# Patient Record
Sex: Female | Born: 1945 | Race: White | Hispanic: No | Marital: Married | Smoking: Former smoker
Health system: Southern US, Community
[De-identification: ages and names within clinical notes are randomized; demographics above are authoritative.]

## PROBLEM LIST (undated history)

## (undated) DIAGNOSIS — M8580 Other specified disorders of bone density and structure, unspecified site: Secondary | ICD-10-CM

## (undated) DIAGNOSIS — E785 Hyperlipidemia, unspecified: Secondary | ICD-10-CM

## (undated) DIAGNOSIS — K589 Irritable bowel syndrome without diarrhea: Secondary | ICD-10-CM

## (undated) DIAGNOSIS — K219 Gastro-esophageal reflux disease without esophagitis: Secondary | ICD-10-CM

## (undated) DIAGNOSIS — R51 Headache: Secondary | ICD-10-CM

## (undated) DIAGNOSIS — K449 Diaphragmatic hernia without obstruction or gangrene: Secondary | ICD-10-CM

## (undated) DIAGNOSIS — R0902 Hypoxemia: Secondary | ICD-10-CM

## (undated) DIAGNOSIS — N952 Postmenopausal atrophic vaginitis: Secondary | ICD-10-CM

## (undated) DIAGNOSIS — K5790 Diverticulosis of intestine, part unspecified, without perforation or abscess without bleeding: Secondary | ICD-10-CM

## (undated) DIAGNOSIS — R519 Headache, unspecified: Secondary | ICD-10-CM

## (undated) DIAGNOSIS — D869 Sarcoidosis, unspecified: Secondary | ICD-10-CM

## (undated) DIAGNOSIS — H269 Unspecified cataract: Secondary | ICD-10-CM

## (undated) DIAGNOSIS — Z8701 Personal history of pneumonia (recurrent): Secondary | ICD-10-CM

## (undated) DIAGNOSIS — M1990 Unspecified osteoarthritis, unspecified site: Secondary | ICD-10-CM

## (undated) DIAGNOSIS — C50919 Malignant neoplasm of unspecified site of unspecified female breast: Secondary | ICD-10-CM

## (undated) DIAGNOSIS — Z8619 Personal history of other infectious and parasitic diseases: Secondary | ICD-10-CM

## (undated) DIAGNOSIS — Z95818 Presence of other cardiac implants and grafts: Secondary | ICD-10-CM

## (undated) DIAGNOSIS — G4730 Sleep apnea, unspecified: Secondary | ICD-10-CM

## (undated) DIAGNOSIS — T7840XA Allergy, unspecified, initial encounter: Secondary | ICD-10-CM

## (undated) HISTORY — DX: Unspecified osteoarthritis, unspecified site: M19.90

## (undated) HISTORY — DX: Unspecified cataract: H26.9

## (undated) HISTORY — DX: Sleep apnea, unspecified: G47.30

## (undated) HISTORY — DX: Hyperlipidemia, unspecified: E78.5

## (undated) HISTORY — DX: Diaphragmatic hernia without obstruction or gangrene: K44.9

## (undated) HISTORY — DX: Allergy, unspecified, initial encounter: T78.40XA

## (undated) HISTORY — DX: Irritable bowel syndrome, unspecified: K58.9

## (undated) HISTORY — DX: Hypoxemia: R09.02

## (undated) HISTORY — PX: TUBAL LIGATION: SHX77

## (undated) HISTORY — DX: Gastro-esophageal reflux disease without esophagitis: K21.9

## (undated) HISTORY — DX: Personal history of pneumonia (recurrent): Z87.01

## (undated) HISTORY — DX: Diverticulosis of intestine, part unspecified, without perforation or abscess without bleeding: K57.90

## (undated) HISTORY — DX: Personal history of other infectious and parasitic diseases: Z86.19

## (undated) HISTORY — PX: POLYPECTOMY: SHX149

## (undated) HISTORY — DX: Malignant neoplasm of unspecified site of unspecified female breast: C50.919

## (undated) HISTORY — PX: APPENDECTOMY: SHX54

## (undated) HISTORY — PX: EYE SURGERY: SHX253

## (undated) HISTORY — DX: Other specified disorders of bone density and structure, unspecified site: M85.80

## (undated) HISTORY — PX: SPINE SURGERY: SHX786

## (undated) HISTORY — DX: Postmenopausal atrophic vaginitis: N95.2

## (undated) HISTORY — PX: BREAST SURGERY: SHX581

---

## 1999-10-02 HISTORY — PX: DILATION AND CURETTAGE OF UTERUS: SHX78

## 1999-12-31 ENCOUNTER — Encounter: Payer: Self-pay | Admitting: Gastroenterology

## 2000-12-15 ENCOUNTER — Encounter: Admission: RE | Admit: 2000-12-15 | Discharge: 2000-12-15 | Payer: Self-pay | Admitting: Gastroenterology

## 2000-12-15 ENCOUNTER — Encounter: Payer: Self-pay | Admitting: Gastroenterology

## 2001-06-20 ENCOUNTER — Encounter: Payer: Self-pay | Admitting: Family Medicine

## 2001-06-20 ENCOUNTER — Encounter: Admission: RE | Admit: 2001-06-20 | Discharge: 2001-06-20 | Payer: Self-pay | Admitting: Family Medicine

## 2001-07-15 LAB — FECAL OCCULT BLOOD, GUAIAC: Fecal Occult Blood: NEGATIVE

## 2001-08-31 HISTORY — PX: TOTAL ABDOMINAL HYSTERECTOMY: SHX209

## 2002-05-31 HISTORY — PX: LAPAROSCOPIC TOTAL HYSTERECTOMY: SUR800

## 2003-06-18 ENCOUNTER — Ambulatory Visit (HOSPITAL_COMMUNITY): Admission: RE | Admit: 2003-06-18 | Discharge: 2003-06-18 | Payer: Self-pay | Admitting: Gastroenterology

## 2003-06-18 ENCOUNTER — Encounter: Payer: Self-pay | Admitting: Gastroenterology

## 2003-09-01 HISTORY — PX: OTHER SURGICAL HISTORY: SHX169

## 2004-09-24 ENCOUNTER — Ambulatory Visit: Payer: Self-pay | Admitting: Radiation Oncology

## 2004-10-31 ENCOUNTER — Ambulatory Visit: Payer: Self-pay | Admitting: Family Medicine

## 2005-03-11 ENCOUNTER — Ambulatory Visit: Payer: Self-pay | Admitting: Family Medicine

## 2005-03-26 ENCOUNTER — Ambulatory Visit: Payer: Self-pay | Admitting: Radiation Oncology

## 2005-04-01 ENCOUNTER — Ambulatory Visit: Payer: Self-pay | Admitting: Gastroenterology

## 2005-04-29 ENCOUNTER — Ambulatory Visit: Payer: Self-pay | Admitting: Gastroenterology

## 2005-07-20 ENCOUNTER — Other Ambulatory Visit: Admission: RE | Admit: 2005-07-20 | Discharge: 2005-07-20 | Payer: Self-pay | Admitting: Family Medicine

## 2005-07-20 ENCOUNTER — Encounter (INDEPENDENT_AMBULATORY_CARE_PROVIDER_SITE_OTHER): Payer: Self-pay | Admitting: *Deleted

## 2005-07-20 ENCOUNTER — Ambulatory Visit: Payer: Self-pay | Admitting: Family Medicine

## 2005-07-20 LAB — CONVERTED CEMR LAB: Pap Smear: NORMAL

## 2005-08-05 ENCOUNTER — Ambulatory Visit: Payer: Self-pay | Admitting: Family Medicine

## 2005-10-09 ENCOUNTER — Ambulatory Visit: Payer: Self-pay | Admitting: Radiation Oncology

## 2006-01-04 ENCOUNTER — Ambulatory Visit: Payer: Self-pay | Admitting: Family Medicine

## 2006-07-05 ENCOUNTER — Ambulatory Visit: Payer: Self-pay | Admitting: Family Medicine

## 2006-07-27 ENCOUNTER — Ambulatory Visit: Payer: Self-pay | Admitting: Family Medicine

## 2006-09-02 ENCOUNTER — Ambulatory Visit: Payer: Self-pay | Admitting: Family Medicine

## 2006-10-19 ENCOUNTER — Ambulatory Visit: Payer: Self-pay | Admitting: Family Medicine

## 2006-10-22 ENCOUNTER — Ambulatory Visit: Payer: Self-pay | Admitting: Family Medicine

## 2006-11-03 ENCOUNTER — Ambulatory Visit: Payer: Self-pay | Admitting: Family Medicine

## 2007-07-08 ENCOUNTER — Ambulatory Visit: Payer: Self-pay | Admitting: Family Medicine

## 2007-07-27 ENCOUNTER — Telehealth: Payer: Self-pay | Admitting: Family Medicine

## 2007-08-12 ENCOUNTER — Ambulatory Visit: Payer: Self-pay | Admitting: Family Medicine

## 2007-08-12 DIAGNOSIS — Z8719 Personal history of other diseases of the digestive system: Secondary | ICD-10-CM | POA: Insufficient documentation

## 2007-08-15 LAB — CONVERTED CEMR LAB
Albumin: 3.7 g/dL (ref 3.5–5.2)
BUN: 18 mg/dL (ref 6–23)
Basophils Absolute: 0 10*3/uL (ref 0.0–0.1)
Basophils Relative: 0.1 % (ref 0.0–1.0)
CO2: 26 meq/L (ref 19–32)
Calcium: 9.2 mg/dL (ref 8.4–10.5)
Chloride: 109 meq/L (ref 96–112)
Creatinine, Ser: 0.8 mg/dL (ref 0.4–1.2)
Eosinophils Absolute: 0.2 10*3/uL (ref 0.0–0.6)
Eosinophils Relative: 3.6 % (ref 0.0–5.0)
GFR calc Af Amer: 93 mL/min
GFR calc non Af Amer: 77 mL/min
Glucose, Bld: 87 mg/dL (ref 70–99)
HCT: 39 % (ref 36.0–46.0)
Hemoglobin: 13.8 g/dL (ref 12.0–15.0)
Lymphocytes Relative: 25.6 % (ref 12.0–46.0)
MCHC: 35.4 g/dL (ref 30.0–36.0)
MCV: 91.9 fL (ref 78.0–100.0)
Monocytes Absolute: 0.5 10*3/uL (ref 0.2–0.7)
Monocytes Relative: 7.5 % (ref 3.0–11.0)
Neutro Abs: 3.8 10*3/uL (ref 1.4–7.7)
Neutrophils Relative %: 63.2 % (ref 43.0–77.0)
Phosphorus: 3.5 mg/dL (ref 2.3–4.6)
Platelets: 327 10*3/uL (ref 150–400)
Potassium: 4.1 meq/L (ref 3.5–5.1)
RBC: 4.24 M/uL (ref 3.87–5.11)
RDW: 13.1 % (ref 11.5–14.6)
Sodium: 144 meq/L (ref 135–145)
WBC: 6.1 10*3/uL (ref 4.5–10.5)

## 2007-08-16 ENCOUNTER — Encounter: Payer: Self-pay | Admitting: Family Medicine

## 2007-09-07 ENCOUNTER — Encounter: Payer: Self-pay | Admitting: Family Medicine

## 2007-09-07 DIAGNOSIS — K5730 Diverticulosis of large intestine without perforation or abscess without bleeding: Secondary | ICD-10-CM | POA: Insufficient documentation

## 2007-09-07 DIAGNOSIS — H9319 Tinnitus, unspecified ear: Secondary | ICD-10-CM | POA: Insufficient documentation

## 2007-09-07 DIAGNOSIS — J309 Allergic rhinitis, unspecified: Secondary | ICD-10-CM | POA: Insufficient documentation

## 2007-09-07 DIAGNOSIS — K760 Fatty (change of) liver, not elsewhere classified: Secondary | ICD-10-CM | POA: Insufficient documentation

## 2007-11-17 ENCOUNTER — Ambulatory Visit: Payer: Self-pay | Admitting: Family Medicine

## 2007-11-17 DIAGNOSIS — E785 Hyperlipidemia, unspecified: Secondary | ICD-10-CM | POA: Insufficient documentation

## 2007-11-17 DIAGNOSIS — M8580 Other specified disorders of bone density and structure, unspecified site: Secondary | ICD-10-CM | POA: Insufficient documentation

## 2007-11-17 DIAGNOSIS — N952 Postmenopausal atrophic vaginitis: Secondary | ICD-10-CM | POA: Insufficient documentation

## 2007-11-17 LAB — CONVERTED CEMR LAB
Bilirubin Urine: NEGATIVE
Blood in Urine, dipstick: NEGATIVE
Glucose, Urine, Semiquant: NEGATIVE
Ketones, urine, test strip: NEGATIVE
Nitrite: NEGATIVE
Protein, U semiquant: NEGATIVE
Specific Gravity, Urine: 1.01
Urobilinogen, UA: 0.2
pH: 6

## 2007-11-18 LAB — CONVERTED CEMR LAB
ALT: 31 units/L (ref 0–35)
AST: 23 units/L (ref 0–37)
Albumin: 4.2 g/dL (ref 3.5–5.2)
Alkaline Phosphatase: 86 units/L (ref 39–117)
BUN: 17 mg/dL (ref 6–23)
Basophils Absolute: 0 10*3/uL (ref 0.0–0.1)
Basophils Relative: 0.6 % (ref 0.0–1.0)
Bilirubin, Direct: 0.1 mg/dL (ref 0.0–0.3)
CO2: 30 meq/L (ref 19–32)
Calcium: 9.5 mg/dL (ref 8.4–10.5)
Chloride: 105 meq/L (ref 96–112)
Cholesterol: 214 mg/dL (ref 0–200)
Creatinine, Ser: 0.8 mg/dL (ref 0.4–1.2)
Direct LDL: 148.2 mg/dL
Eosinophils Absolute: 0.2 10*3/uL (ref 0.0–0.6)
Eosinophils Relative: 2.7 % (ref 0.0–5.0)
GFR calc Af Amer: 93 mL/min
GFR calc non Af Amer: 77 mL/min
Glucose, Bld: 86 mg/dL (ref 70–99)
HCT: 44.8 % (ref 36.0–46.0)
HDL: 52.1 mg/dL (ref >39.0)
Hemoglobin: 14.8 g/dL (ref 12.0–15.0)
Lymphocytes Relative: 24.8 % (ref 12.0–46.0)
MCHC: 33.1 g/dL (ref 30.0–36.0)
MCV: 92.8 fL (ref 78.0–100.0)
Monocytes Absolute: 0.4 10*3/uL (ref 0.2–0.7)
Monocytes Relative: 6.1 % (ref 3.0–11.0)
Neutro Abs: 4.7 10*3/uL (ref 1.4–7.7)
Neutrophils Relative %: 65.8 % (ref 43.0–77.0)
Platelets: 327 10*3/uL (ref 150–400)
Potassium: 4.3 meq/L (ref 3.5–5.1)
RBC: 4.82 M/uL (ref 3.87–5.11)
RDW: 12.7 % (ref 11.5–14.6)
Sodium: 141 meq/L (ref 135–145)
TSH: 2.09 microintl units/mL (ref 0.35–5.50)
Total Bilirubin: 0.9 mg/dL (ref 0.3–1.2)
Total CHOL/HDL Ratio: 4.1
Total Protein: 7.5 g/dL (ref 6.0–8.3)
Triglycerides: 91 mg/dL (ref 0–149)
VLDL: 18 mg/dL (ref 0–40)
WBC: 7.1 10*3/uL (ref 4.5–10.5)

## 2007-11-24 ENCOUNTER — Encounter: Payer: Self-pay | Admitting: Family Medicine

## 2007-11-30 ENCOUNTER — Ambulatory Visit: Payer: Self-pay | Admitting: Family Medicine

## 2007-11-30 ENCOUNTER — Encounter: Payer: Self-pay | Admitting: Family Medicine

## 2007-12-02 ENCOUNTER — Encounter (INDEPENDENT_AMBULATORY_CARE_PROVIDER_SITE_OTHER): Payer: Self-pay | Admitting: *Deleted

## 2007-12-02 LAB — HM MAMMOGRAPHY: HM Mammogram: NORMAL

## 2007-12-29 ENCOUNTER — Ambulatory Visit: Payer: Self-pay | Admitting: Family Medicine

## 2007-12-29 DIAGNOSIS — K5732 Diverticulitis of large intestine without perforation or abscess without bleeding: Secondary | ICD-10-CM | POA: Insufficient documentation

## 2007-12-30 ENCOUNTER — Emergency Department: Payer: Self-pay | Admitting: Emergency Medicine

## 2008-06-11 ENCOUNTER — Ambulatory Visit: Payer: Self-pay | Admitting: Family Medicine

## 2008-07-06 ENCOUNTER — Ambulatory Visit: Payer: Self-pay | Admitting: Internal Medicine

## 2008-08-14 ENCOUNTER — Telehealth (INDEPENDENT_AMBULATORY_CARE_PROVIDER_SITE_OTHER): Payer: Self-pay | Admitting: *Deleted

## 2008-10-21 ENCOUNTER — Telehealth: Payer: Self-pay | Admitting: Internal Medicine

## 2008-10-24 ENCOUNTER — Ambulatory Visit: Payer: Self-pay | Admitting: Family Medicine

## 2008-12-18 ENCOUNTER — Ambulatory Visit: Payer: Self-pay | Admitting: Family Medicine

## 2008-12-19 LAB — CONVERTED CEMR LAB
ALT: 20 units/L (ref 0–35)
AST: 19 units/L (ref 0–37)
Albumin: 3.9 g/dL (ref 3.5–5.2)
Alkaline Phosphatase: 71 units/L (ref 39–117)
BUN: 13 mg/dL (ref 6–23)
Basophils Absolute: 0 10*3/uL (ref 0.0–0.1)
Basophils Relative: 0.6 % (ref 0.0–3.0)
Bilirubin, Direct: 0.1 mg/dL (ref 0.0–0.3)
CO2: 29 meq/L (ref 19–32)
Calcium: 9 mg/dL (ref 8.4–10.5)
Chloride: 109 meq/L (ref 96–112)
Cholesterol: 207 mg/dL — ABNORMAL HIGH (ref 0–200)
Creatinine, Ser: 0.8 mg/dL (ref 0.4–1.2)
Direct LDL: 134.4 mg/dL
Eosinophils Absolute: 0.2 10*3/uL (ref 0.0–0.7)
Eosinophils Relative: 3.7 % (ref 0.0–5.0)
GFR calc non Af Amer: 76.8 mL/min (ref >60)
Glucose, Bld: 84 mg/dL (ref 70–99)
HCT: 41.7 % (ref 36.0–46.0)
HDL: 52.6 mg/dL (ref >39.00)
Hemoglobin: 14.1 g/dL (ref 12.0–15.0)
Lymphocytes Relative: 28.1 % (ref 12.0–46.0)
Lymphs Abs: 1.6 10*3/uL (ref 0.7–4.0)
MCHC: 33.8 g/dL (ref 30.0–36.0)
MCV: 93.6 fL (ref 78.0–100.0)
Monocytes Absolute: 0.4 10*3/uL (ref 0.1–1.0)
Monocytes Relative: 6.9 % (ref 3.0–12.0)
Neutro Abs: 3.6 10*3/uL (ref 1.4–7.7)
Neutrophils Relative %: 60.7 % (ref 43.0–77.0)
Platelets: 289 10*3/uL (ref 150.0–400.0)
Potassium: 4.3 meq/L (ref 3.5–5.1)
RBC: 4.46 M/uL (ref 3.87–5.11)
RDW: 13 % (ref 11.5–14.6)
Sodium: 143 meq/L (ref 135–145)
TSH: 2.12 microintl units/mL (ref 0.35–5.50)
Total Bilirubin: 0.8 mg/dL (ref 0.3–1.2)
Total CHOL/HDL Ratio: 4
Total Protein: 6.8 g/dL (ref 6.0–8.3)
Triglycerides: 87 mg/dL (ref 0.0–149.0)
VLDL: 17.4 mg/dL (ref 0.0–40.0)
WBC: 5.8 10*3/uL (ref 4.5–10.5)

## 2008-12-20 ENCOUNTER — Encounter: Payer: Self-pay | Admitting: Family Medicine

## 2008-12-27 ENCOUNTER — Telehealth: Payer: Self-pay | Admitting: Family Medicine

## 2009-01-02 ENCOUNTER — Encounter: Payer: Self-pay | Admitting: Family Medicine

## 2009-01-02 ENCOUNTER — Ambulatory Visit: Payer: Self-pay | Admitting: Family Medicine

## 2009-01-02 ENCOUNTER — Telehealth: Payer: Self-pay | Admitting: Family Medicine

## 2009-01-02 DIAGNOSIS — Z853 Personal history of malignant neoplasm of breast: Secondary | ICD-10-CM | POA: Insufficient documentation

## 2009-01-17 ENCOUNTER — Encounter: Payer: Self-pay | Admitting: Family Medicine

## 2009-01-24 ENCOUNTER — Ambulatory Visit: Payer: Self-pay | Admitting: Gastroenterology

## 2009-01-24 DIAGNOSIS — K219 Gastro-esophageal reflux disease without esophagitis: Secondary | ICD-10-CM | POA: Insufficient documentation

## 2009-02-04 ENCOUNTER — Encounter: Payer: Self-pay | Admitting: Family Medicine

## 2009-02-12 ENCOUNTER — Encounter: Payer: Self-pay | Admitting: Family Medicine

## 2009-02-28 HISTORY — PX: MASTECTOMY: SHX3

## 2009-03-07 ENCOUNTER — Encounter: Payer: Self-pay | Admitting: Family Medicine

## 2009-03-07 ENCOUNTER — Ambulatory Visit: Payer: Self-pay | Admitting: General Surgery

## 2009-03-14 ENCOUNTER — Encounter: Payer: Self-pay | Admitting: Family Medicine

## 2009-03-18 ENCOUNTER — Encounter: Payer: Self-pay | Admitting: Family Medicine

## 2009-03-25 ENCOUNTER — Encounter: Payer: Self-pay | Admitting: Family Medicine

## 2009-03-28 ENCOUNTER — Encounter: Payer: Self-pay | Admitting: Family Medicine

## 2009-03-28 ENCOUNTER — Telehealth: Payer: Self-pay | Admitting: Family Medicine

## 2009-04-15 ENCOUNTER — Encounter: Payer: Self-pay | Admitting: Family Medicine

## 2009-05-01 ENCOUNTER — Ambulatory Visit: Payer: Self-pay | Admitting: Oncology

## 2009-05-07 ENCOUNTER — Encounter: Payer: Self-pay | Admitting: Family Medicine

## 2009-05-14 ENCOUNTER — Ambulatory Visit: Payer: Self-pay | Admitting: Oncology

## 2009-05-31 ENCOUNTER — Ambulatory Visit: Payer: Self-pay | Admitting: Oncology

## 2009-06-19 ENCOUNTER — Ambulatory Visit: Payer: Self-pay | Admitting: Family Medicine

## 2009-07-23 ENCOUNTER — Ambulatory Visit: Payer: Self-pay | Admitting: Family Medicine

## 2009-07-23 LAB — CONVERTED CEMR LAB
Bilirubin Urine: NEGATIVE
Ketones, urine, test strip: NEGATIVE
Urobilinogen, UA: 0.2
pH: 8.5

## 2009-07-24 ENCOUNTER — Encounter: Payer: Self-pay | Admitting: Family Medicine

## 2009-09-03 ENCOUNTER — Telehealth: Payer: Self-pay | Admitting: Family Medicine

## 2009-12-27 ENCOUNTER — Encounter (INDEPENDENT_AMBULATORY_CARE_PROVIDER_SITE_OTHER): Payer: Self-pay | Admitting: *Deleted

## 2009-12-29 ENCOUNTER — Ambulatory Visit: Payer: Self-pay | Admitting: Oncology

## 2009-12-30 ENCOUNTER — Ambulatory Visit: Payer: Self-pay | Admitting: Family Medicine

## 2009-12-31 LAB — CONVERTED CEMR LAB
ALT: 18 units/L (ref 0–35)
AST: 20 units/L (ref 0–37)
BUN: 12 mg/dL (ref 6–23)
Bilirubin, Direct: 0.1 mg/dL (ref 0.0–0.3)
Cholesterol: 191 mg/dL (ref 0–200)
Creatinine, Ser: 0.8 mg/dL (ref 0.4–1.2)
Eosinophils Relative: 3.1 % (ref 0.0–5.0)
GFR calc non Af Amer: 76.55 mL/min (ref >60)
LDL Cholesterol: 122 mg/dL — ABNORMAL HIGH (ref 0–99)
Lymphocytes Relative: 28.2 % (ref 12.0–46.0)
Monocytes Relative: 7.5 % (ref 3.0–12.0)
Neutrophils Relative %: 60.8 % (ref 43.0–77.0)
Platelets: 266 10*3/uL (ref 150.0–400.0)
Potassium: 4.6 meq/L (ref 3.5–5.1)
Total Bilirubin: 0.5 mg/dL (ref 0.3–1.2)
Triglycerides: 92 mg/dL (ref 0.0–149.0)
VLDL: 18.4 mg/dL (ref 0.0–40.0)
Vitamin B-12: 542 pg/mL (ref 211–911)
WBC: 5.8 10*3/uL (ref 4.5–10.5)

## 2010-01-02 ENCOUNTER — Telehealth: Payer: Self-pay | Admitting: Family Medicine

## 2010-01-07 ENCOUNTER — Ambulatory Visit: Payer: Self-pay | Admitting: Oncology

## 2010-01-09 ENCOUNTER — Ambulatory Visit: Payer: Self-pay | Admitting: Oncology

## 2010-01-24 ENCOUNTER — Telehealth: Payer: Self-pay | Admitting: Family Medicine

## 2010-01-29 ENCOUNTER — Ambulatory Visit: Payer: Self-pay | Admitting: Oncology

## 2010-02-11 ENCOUNTER — Encounter (INDEPENDENT_AMBULATORY_CARE_PROVIDER_SITE_OTHER): Payer: Self-pay | Admitting: *Deleted

## 2010-02-13 ENCOUNTER — Ambulatory Visit: Payer: Self-pay | Admitting: Gastroenterology

## 2010-02-28 HISTORY — PX: COLONOSCOPY: SHX174

## 2010-03-06 ENCOUNTER — Ambulatory Visit: Payer: Self-pay | Admitting: Gastroenterology

## 2010-03-06 LAB — HM COLONOSCOPY

## 2010-03-10 ENCOUNTER — Encounter: Payer: Self-pay | Admitting: Gastroenterology

## 2010-06-04 ENCOUNTER — Ambulatory Visit: Payer: Self-pay | Admitting: Family Medicine

## 2010-08-01 ENCOUNTER — Ambulatory Visit: Payer: Self-pay | Admitting: Family Medicine

## 2010-08-01 ENCOUNTER — Telehealth: Payer: Self-pay | Admitting: Family Medicine

## 2010-10-02 NOTE — Assessment & Plan Note (Signed)
Summary: CPX/CLE   Vital Signs:  Patient profile:   65 year old female Height:      60.5 inches Weight:      144.25 pounds BMI:     27.81 Temp:     98.3 degrees F oral Pulse rate:   68 / minute Pulse rhythm:   regular BP sitting:   112 / 74  (left arm) Cuff size:   regular  Vitals Entered By: Ozzie Hoyle LPN (Dec 30, 5398 86:76 AM) CC: complete physical with breast exam LMP total hyst 2003   History of Present Illness: here for health mt  is feeling good overall   is starting to have some pain in toes and thumbs -- ? arthritis  achey but not swollen  toes hurt more at night  is not bothering her too much yet  does not take naproxen regularly    wt is stable   bp is good 112/74  lipids due for check  osteopenia mild with dexa 5/10 ca and D -- is good with that   atrophic vaginitis-- premarin cream -- no longer using it (had breast ca)  pap nl 06 had tot hyst no other trouble or symptoms except dryness and occ stress incontinence plans to ask oncologist if this would be safe to take   mam -- had breast ca times 2  last mam july has one next tues with oncol f/u on thursday -- 2nd round of tamoxifen   Td 02  shingle status - has had it twice  shingles vaccine is on back order now - would consider when it is availible again   occ fleeting pain in L upper abd  ? if is her diverticulosis  has not had it in a while  is due a colonoscopy - due 9/11- aware and wants to schedule this     Allergies: 1)  * Mycins 2)  * Mobic  Past History:  Past Medical History: Last updated: 03/11/2009 Allergic rhinitis Diverticulosis, colon past hx of shingles  hyperlipidemia atrophic vaginitis  osteopenia Sliding hiatal hernia GERD Arthritis Rt Breast Cancer Irritable Bowel Syndrome Pneumonia breast cancer (R mastectomy)  Past Surgical History: Last updated: 03/11/2009 D & C (10/1999) Colonoscopy- divertics (12/1999) CT abd and pelvis with contrast- normal  except fatty liver, left lung base  ? nodule vs. 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 Eyes:  Denies blurring and eye irritation. CV:  Denies chest pain or discomfort, lightheadness, and palpitations. Resp:  Denies cough and wheezing. GI:  Denies abdominal pain, bloody stools, change in bowel habits, and indigestion. GU:  Denies abnormal vaginal bleeding, discharge, dysuria, and hematuria. MS:  Complains of joint pain and stiffness; denies joint redness, joint swelling, muscle aches, and cramps. Derm:  Denies lesion(s), poor wound healing, and rash. Neuro:  Denies numbness and tingling. Psych:  Denies anxiety and depression. Endo:  Denies cold intolerance, excessive thirst, excessive urination, and heat intolerance. Heme:  Denies abnormal bruising and bleeding.  Physical Exam  General:  Well-developed,well-nourished,in no acute distress; alert,appropriate and cooperative throughout examination Head:  normocephalic, atraumatic, and no abnormalities observed.   Eyes:  vision grossly intact, pupils equal, pupils round, and pupils reactive to light.  no conjunctival pallor, injection or  icterus  Ears:  R ear normal and L ear normal.   Nose:  no nasal discharge.   Mouth:  pharynx pink and moist.   Neck:  supple with full rom and no masses or thyromegally, no JVD or carotid bruit  Chest Wall:  No deformities, masses, or tenderness noted. Breasts:  not done Lungs:  Normal respiratory effort, chest expands symmetrically. Lungs are clear to auscultation, no crackles or wheezes. Heart:  Normal rate and regular rhythm. S1 and S2 normal without gallop, murmur, click, rub or other extra sounds. Abdomen:  Bowel sounds positive,abdomen soft and non-tender without masses, organomegaly or hernias noted. no renal bruits  Msk:  No deformity or scoliosis noted of thoracic or lumbar spine.  no acute joint changes  Pulses:  R and L carotid,radial,femoral,dorsalis pedis and posterior tibial pulses are full and equal bilaterally Extremities:  No clubbing, cyanosis, edema, or deformity noted with normal full range of motion of all joints.   Neurologic:  sensation intact to light touch, gait normal, and DTRs symmetrical and normal.   Skin:  Intact without suspicious lesions or rashes Cervical Nodes:  No lymphadenopathy noted Inguinal Nodes:  No significant adenopathy Psych:  normal affect, talkative and pleasant    Impression & Recommendations:  Problem # 1:  HEALTH MAINTENANCE EXAM (ICD-V70.0) Assessment Comment Only reviewed health habits including diet, exercise and skin cancer prevention reviewed health maintenance list and family history no breast exam today since oncologist will do next week- by pt's request  Orders: TLB-Lipid Panel (80061-LIPID) TLB-BMP (Basic Metabolic Panel-BMET) (75449-EEFEOFH) TLB-CBC Platelet - w/Differential (85025-CBCD) TLB-Hepatic/Liver Function Pnl (80076-HEPATIC) TLB-TSH (Thyroid Stimulating Hormone) (84443-TSH) TLB-B12 + Folate Pnl (82746_82607-B12/FOL) T-Vitamin D (25-Hydroxy) (21975-88325)  Problem # 2:  SCREENING, COLON CANCER  (ICD-V76.51) Assessment: Comment Only due colonoscopy in fall- will sched that  Orders: Gastroenterology Referral (GI)  Problem # 3:  FATIGUE (ICD-780.79) Assessment: New check B12 and folate in light of PPI use  Orders: TLB-Lipid Panel (80061-LIPID) TLB-BMP (Basic Metabolic Panel-BMET) (49826-EBRAXEN) TLB-CBC Platelet - w/Differential (85025-CBCD) TLB-Hepatic/Liver Function Pnl (80076-HEPATIC) TLB-TSH (Thyroid Stimulating Hormone) (84443-TSH) TLB-B12 + Folate Pnl (82746_82607-B12/FOL) T-Vitamin D (25-Hydroxy) (40768-08811) Specimen Handling (99000)  Problem # 4:  GERD (ICD-530.81) Assessment: Unchanged  refil prilosec- doing well on that  Her updated medication list for this problem includes:    Prilosec 20 Mg Cpdr (Omeprazole) ..... One by mouth once daily    Hyoscyamine Sulfate 0.125 Mg Subl (Hyoscyamine sulfate) .Marland Kitchen... 2 tablets by mouth every 4 hours as needed for abdominal pain  Orders: Prescription Created Electronically [phone])  Problem # 5:  NEOPLASM, MALIGNANT, BREAST, HX OF (ICD-V10.3) Assessment: Unchanged for oncol exam and visit next week/ also mammogram  no new lumps on self exam no exam done today  Problem # 6:  VAGINITIS, ATROPHIC, SYMPTOMATIC (ICD-627.3) Assessment: Unchanged with some mild stress incontinence pt will ask her oncol about safety of est cream 2 times per week in light of breast ca  Her updated medication list for this problem includes:    Premarin 0.625 Mg/gm Crea (Estrogens, conjugated) .Marland KitchenMarland KitchenMarland KitchenMarland Kitchen  1 small amount per applicator twice weekly intravaginally  Problem # 7:  HYPERLIPIDEMIA (ICD-272.4) Assessment: Unchanged  labs today  rev low sat fat diet  Orders: TLB-Lipid Panel (80061-LIPID) TLB-BMP (Basic Metabolic Panel-BMET) (28786-VEHMCNO) TLB-CBC Platelet - w/Differential (85025-CBCD) TLB-Hepatic/Liver Function Pnl (80076-HEPATIC) TLB-TSH (Thyroid Stimulating Hormone) (84443-TSH) TLB-B12 + Folate Pnl [phone]) T-Vitamin  D (25-Hydroxy) (65035-46568)  Labs Reviewed: SGOT: 19 (12/18/2008)   SGPT: 20 (12/18/2008)   HDL:52.60 (12/18/2008), 52.1 (11/17/2007)  LDL:DEL (11/17/2007)  Chol:207 (12/18/2008), 214 (11/17/2007)  Trig:87.0 (12/18/2008), 91 (11/17/2007)  Complete Medication List: 1)  Cyclobenzaprine Hcl 10 Mg Tabs (Cyclobenzaprine hcl) .... Take 1/2 tablet by mouth three times a day 2)  Naprosyn 500 Mg Tabs (Naproxen) .... Take 1 tablet by mouth twice a day 3)  Prilosec 20 Mg Cpdr (Omeprazole) .... One by mouth once daily 4)  Premarin 0.625 Mg/gm Crea (Estrogens, conjugated) .Marland Kitchen.. 1 small amount per applicator twice weekly intravaginally 5)  Centrum Silver Tabs (Multiple vitamins-minerals) .... One by mouth dialy 6)  Fish Oil Tabs  .... Daily 7)  Metronidazole 500 Mg Tabs (Metronidazole) .Marland Kitchen.. 1 by mouth three times a day for 10 days 8)  Cipro 500 Mg Tabs (Ciprofloxacin hcl) .Marland Kitchen.. 1 by mouth two times a day for 10 days for diverticulitis 9)  [facility] Vitamin D 2000 Unit Tabs (Cholecalciferol) .... One tablet by mouth every other day 10)  Hyoscyamine Sulfate 0.125 Mg Subl (Hyoscyamine sulfate) .... 2 tablets by mouth every 4 hours as needed for abdominal pain  Other Orders: Venipuncture (12751)  Patient Instructions: 1)  try glucosamine / chondroiton -- for joint pain over the counter  2)  if your joint pain worsens - call for appt with Dr Lorelei Pont  3)  lab today including vitamin D and vitamin B12 4)  you can try biotin over the counter for nails  5)  follow up with your oncologist at planned  6)  we will schedule colonoscopy at check out  7)  keep up the healthy diet and exercise  Prescriptions: PRILOSEC 20 MG  CPDR (OMEPRAZOLE) one by mouth once daily  #30 x 11   Entered and Authorized by:   Allena Earing MD   Signed by:   Allena Earing MD on 12/30/2009   Method used:   Electronically to        [facility] (retail)       [address]       [place], [place]  [address]       Ph: [phone]        Fax: [phone]   RxID:   [id number] NAPROSYN 500 MG TABS (NAPROXEN) Take 1 tablet by mouth twice a day  #60 x 5   Entered and Authorized by:   Allena Earing MD   Signed by:   Allena Earing MD on 12/30/2009   Method used:   Electronically to        [facility] (retail)       [address]       [place], [place]  [address]       Ph: [phone]       Fax: [phone]   RxID:   [id number]   Current Allergies (reviewed today): * MYCINS * MOBIC

## 2010-10-02 NOTE — Assessment & Plan Note (Signed)
Summary: DIVERTICULITIS/YF   History of Present Illness Visit Type: Initial Consult Primary GI MD: Joylene Igo MD [facility] Primary Provider: Loura Pardon, MD Requesting Provider: Loura Pardon, MD Chief Complaint: Diverticulosis. Pt denies current GI complaints History of Present Illness:   This is a 65 year old white female who relates recurrent episodes of left-sided abdominal pain. Occasionally the episodes have been associated with fevers. She notes certain foods lead to diarrhea an urgent bowel movement and she generally avoids these foods. She states her symptoms often bothers her when she was traveling. She states she underwent a CT scan of the abdomen and pelvis at [facility] approximately one year ago. She has been treated for recurrent diverticulitis with Cipro and Flagyl on at least 3 occasions over the past 8 months. Last colonoscopy in August 2006 revealed left colon diverticulosis and internal hemorrhoids.   GI Review of Systems    Reports abdominal pain.     Location of  Abdominal pain: LLQ.    Denies acid reflux, belching, bloating, chest pain, dysphagia with liquids, dysphagia with solids, heartburn, loss of appetite, nausea, vomiting, vomiting blood, weight loss, and  weight gain.      Reports diverticulosis.     Denies anal fissure, black tarry stools, change in bowel habit, constipation, diarrhea, fecal incontinence, heme positive stool, hemorrhoids, irritable bowel syndrome, jaundice, light color stool, liver problems, rectal bleeding, and  rectal pain. Current Medications (verified): 1)  Cyclobenzaprine Hcl 10 Mg Tabs (Cyclobenzaprine Hcl) .... Take 1/2 Tablet By Mouth Three Times A Day 2)  Naprosyn 500 Mg Tabs (Naproxen) .... Take 1 Tablet By Mouth Twice A Day 3)  Prilosec 20 Mg  Cpdr (Omeprazole) .... One By Mouth Once Daily 4)  Premarin 0.625 Mg/gm  Crea (Estrogens, Conjugated) .Marland Kitchen.. 1 Small Amount Per Applicator Twice Weekly Intravaginally 5)   Centrum Silver  Tabs (Multiple Vitamins-Minerals) .... One By Mouth Dialy 6)  Fish Oil Tabs .... Daily 7)  Metronidazole 500 Mg Tabs (Metronidazole) .Marland Kitchen.. 1 Two Times A Day For 10days 8)  Cipro 500 Mg Tabs (Ciprofloxacin Hcl) .Marland Kitchen.. 1 By Mouth Two Times A Day For 10 Days For Diverticulitis 9)  Cvs Vitamin D 2000 Unit Tabs (Cholecalciferol) .... One Tablet By Mouth Every Other Day  Allergies (verified): 1)  * Mycins 2)  * Mobic  Past History:  Past Medical History: Allergic rhinitis Diverticulosis, colon past hx of shingles  hyperlipidemia atrophic vaginitis  osteopenia Sliding hiatal hernia GERD Arthritis Rt Breast Cancer Irritable Bowel Syndrome Pneumonia  Past Surgical History: D & C (10/1999) Colonoscopy- divertics (12/1999) CT abd and pelvis with contrast- normal except fatty liver, left lung base  ? nodule vs. 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 Marital Status: Married Children: 2 sons Occupation: retired quit smoking in 1970s caffine- 2 cups coffee once daily wine 1-2 glasses a week  gym 3-4 times per week   Review of Systems       The patient complains of arthritis/joint pain, back pain, breast changes/lumps, muscle pains/cramps, night sweats, and urine leakage.         The pertinent positives and negatives  are noted as above and in the HPI. All other ROS were reviewed and were negative.   Vital Signs:  Patient profile:   65 year old female Height:      61 inches Weight:       147 pounds Pulse rate:   78 / minute Pulse rhythm:   regular BP sitting:   130 / 70  (right arm) Cuff size:   regular  Vitals Entered By: Christian Mate CMA (Jan 24, 2009 9:57 AM)  Physical Exam  General:  Well developed, well nourished, no acute distress. Head:  Normocephalic and atraumatic. Eyes:  PERRLA, no icterus. Ears:  Normal auditory acuity. Mouth:  No deformity or lesions, dentition normal. Neck:  Supple; no masses or thyromegaly. Lungs:  Clear throughout to auscultation. Heart:  Regular rate and rhythm; no murmurs, rubs,  or bruits. Abdomen:  Soft, nontender and nondistended. No masses, hepatosplenomegaly or hernias noted. Normal bowel sounds. Msk:  Symmetrical with no gross deformities. Normal posture. Pulses:  Normal pulses noted. Extremities:  No clubbing, cyanosis, edema or deformities noted. Neurologic:  Alert and  oriented x4;  grossly normal neurologically. Cervical Nodes:  No significant cervical adenopathy. Inguinal Nodes:  No significant inguinal adenopathy. Psych:  Alert and cooperative. Normal mood and affect.  Impression & Recommendations:  Problem # 1:  ABDOMINAL PAIN-LLQ (ICD-789.04) Assessment Deteriorated Recurrent left lower quadrant abdominal pain. I suspect she has had several episodes of diverticulitis however she also has irritable bowel syndrome. Advised her to use an anti-spasmodic with the onset of abdominal pain and if her symptoms are relieved with the anti-spasmodic she likely will not require antibiotics. If she has severe pain or fevers she needs to seek medical attention as this is more likely than diverticulitis and antibiotics would be indicated.  Problem # 2:  DIVERTICULOSIS, COLON (ICD-562.10) Assessment: Unchanged As in problem #1. In addition long-term high fiber diet at least 6 glasses of water a day.  Problem # 3:  GERD (ICD-530.81) Assessment: Unchanged Continue standard antireflux measures and Prilosec 20 mg q.a.m. Upper  endoscopy performed in May 2001 revealed only a small hiatal hernia.  Patient Instructions: 1)  Pick up your prescription at your pharmacy. 2)  Please continue current medications.  3)  Copy sent to : Loura Pardon, MD 4)  The medication list was reviewed and reconciled.  All changed / newly prescribed medications were explained.  A complete medication list was provided to the patient / caregiver.  Prescriptions: HYOSCYAMINE SULFATE 0.125 MG SUBL (HYOSCYAMINE SULFATE) 2 tablets by mouth every 4 hours as needed for abdominal pain  #60 x 11   Entered by:   Marlon Pel CMA   Authorized by:   Ladene Artist MD [facility]   Signed by:   Marlon Pel CMA on 01/24/2009   Method used:   Electronically to        [facility] (retail)       [address]       [place], [place]  [address]       Ph: [phone]       Fax: [phone]   RxID:   [id number]

## 2010-10-02 NOTE — Assessment & Plan Note (Signed)
Summary: FLU SHOT/JRR   Nurse Visit   Allergies: 1)  * Mycins 2)  * Mobic  Orders Added: 1)  Flu Vaccine 72yr + MEDICARE PATIENTS [Q2039] 2)  Administration Flu vaccine - MCR [[H6067] Flu Vaccine Consent Questions     Do you have a history of severe allergic reactions to this vaccine? no    Any prior history of allergic reactions to egg and/or gelatin? no    Do you have a sensitivity to the preservative Thimersol? no    Do you have a past history of Guillan-Barre Syndrome? no    Do you currently have an acute febrile illness? no    Have you ever had a severe reaction to latex? no    Vaccine information given and explained to patient? yes    Are you currently pregnant? no    Lot Number:[id number]   Exp Date:02/28/2011   Site Given  Left Deltoid IMu

## 2010-10-02 NOTE — Letter (Signed)
Summary: Colonoscopy Letter  [facility]  [place], [place] [address]   Phone: [phone]  Fax: [phone]      December 27, 2009 MRN: [id number]   [facility] [address] [place], [place]  [address]   Dear Ms. Passmore,   According to your medical record, it is time for you to schedule a Colonoscopy. The American Cancer Society recommends this procedure as a method to detect early colon cancer. Patients with a family history of colon cancer, or a personal history of colon polyps or inflammatory bowel disease are at increased risk.  This letter has beeen generated based on the recommendations made at the time of your procedure. If you feel that in your particular situation this may no longer apply, please contact our office.  Please call our office at [phone] to schedule this appointment or to update your records at your earliest convenience.  Thank you for cooperating with [place] to provide you with the very best care possible.   Sincerely,  Norberto Sorenson T. Fuller Plan, M.D.  [facility] [phone]

## 2010-10-02 NOTE — Progress Notes (Signed)
Summary: diverticulitis  Phone Note Call from Patient Call back at Home Phone [phone]   Caller: Patient Call For: Allena Earing MD Summary of Call: Patient says that she has been having alot of lower abdominal pain and the area hurts when she walks.  She feels like she is having a Diverticulitis attack, but symptoms are not like it typically feels when having one. Feels very tender in the area, has not started running a fever yet. On Wed. started cramping really bad, but today cramping has stopped. Patient wants to know what she needs to do. Please advise. Initial call taken by: Lacretia Nicks,  Jan 24, 2010 2:55 PM  Follow-up for Phone Call        she needs to be seen -- if all docs are full here - [facility] Follow-up by: Allena Earing MD,  Jan 24, 2010 3:59 PM  Additional Follow-up for Phone Call Additional follow up Details #1::        Patient notified as instructed by telephone. Pt is going to [facility].Ozzie Hoyle LPN  Jan 25, 7792 9:68 PM

## 2010-10-02 NOTE — Assessment & Plan Note (Signed)
Summary: DIVERTICULITIS PER DR TOWER/RI   Vital Signs:  Patient profile:   65 year old female Height:      60.5 inches Weight:      146.75 pounds BMI:     28.29 Temp:     98.2 degrees F oral Pulse rate:   72 / minute Pulse rhythm:   regular BP sitting:   112 / 70  (left arm) Cuff size:   regular  Vitals Entered By: Ozzie Hoyle LPN (August 02, 9891 4:10 PM) CC: diverticulitis, lower abdominal pain   History of Present Illness: thinks she has diverticulitis   is having low abd pain -- whole abdomen  some worse on the L side hurts when she walks  no fever  no nausea or vomiting  no appetite at all  was constipated --for a while -- has not had bm today  had bm yesterday am  lot of water yesterday  no blood in stool   had not eaten nuts or seeds  ? if nuts are in things      Allergies: 1)  * Mycins 2)  * Mobic  Past History:  Past Medical History: Last updated: 03/11/2009 Allergic rhinitis Diverticulosis, colon past hx of shingles  hyperlipidemia atrophic vaginitis  osteopenia Sliding hiatal hernia GERD Arthritis Rt Breast Cancer Irritable Bowel Syndrome Pneumonia breast cancer (R mastectomy)  Past Surgical History: Last updated: 03/11/2009 D & C (10/1999) Colonoscopy- divertics (12/1999) CT abd and pelvis with contrast- normal except fatty liver, left lung base  ? nodule vs. 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 CV:  Denies chest pain or discomfort and palpitations. Resp:  Denies cough, shortness of breath, and wheezing. GI:  Complains of abdominal pain and nausea; denies bloody stools, change in bowel habits, dark tarry stools, indigestion, and vomiting. MS:  Denies joint pain, joint redness, and joint swelling. Derm:  Denies itching, lesion(s), poor wound healing, and rash. Neuro:  Denies numbness and tingling. Endo:  Denies cold intolerance and heat intolerance. Heme:  Denies abnormal bruising and bleeding.  Physical Exam  General:  Well-developed,well-nourished,in no acute distress; alert,appropriate and cooperative throughout examination Head:  normocephalic, atraumatic, and no abnormalities observed.   Eyes:  vision grossly intact, pupils equal, pupils round, and pupils reactive to light.  no conjunctival pallor, injection or icterus  Mouth:  pharynx pink and moist.   Neck:  supple with full rom and no masses or thyromegally, no JVD or carotid bruit  Chest Wall:  No deformities, masses, or tenderness noted. Lungs:  Normal respiratory effort, chest expands symmetrically. Lungs are clear to auscultation, no crackles or wheezes. Heart:  Normal rate and regular rhythm. S1 and S2 normal without gallop, murmur, click, rub or other extra sounds. Abdomen:  tender lower abd bilat but worse on  L no rebound or gaurding some pain on shaking table  nl bs 4Q no hepatomegaly and no splenomegaly.   Msk:  No deformity or scoliosis noted of thoracic or lumbar spine.   Extremities:  no cce  Skin:  Intact without suspicious lesions or rashes no jaundice or pallor  Cervical Nodes:  No lymphadenopathy  noted Inguinal Nodes:  No significant adenopathy Psych:  normal affect, talkative and pleasant    Impression & Recommendations:  Problem # 1:  DIVERTICULITIS, ACUTE (ICD-562.11)  with LLQ pain worse with moving / no fever or septic signs tx with flagyl and cipro - which has worked well in past  fluids and adv gradually update asap or seek care if worse or if fever or other symptoms rev last colonoscopy  Orders: Prescription Created Electronically [phone])  Complete Medication List: 1)  Cyclobenzaprine Hcl 10 Mg Tabs (Cyclobenzaprine hcl) .... Take 1/2 tablet by mouth three times a day as needed 2)  Naprosyn 500 Mg Tabs (Naproxen) .... Take 1 tablet by mouth twice a day as needed 3)  Prilosec 20 Mg Cpdr (Omeprazole) .... One by mouth once daily 4)  Premarin 0.625 Mg/gm Crea (Estrogens, conjugated) .Marland Kitchen.. 1 small amount per applicator twice weekly intravaginally 5)  Centrum Silver Tabs (Multiple vitamins-minerals) .... One by mouth dialy 6)  Fish Oil Tabs  .... Daily 7)  Metronidazole 500 Mg Tabs (Metronidazole) .Marland Kitchen.. 1 by mouth three times a day for 10 days 8)  Cipro 500 Mg Tabs (Ciprofloxacin hcl) .Marland Kitchen.. 1 by mouth two times a day for 10 days for diverticulitis 9)  Cvs Vitamin D 2000 Unit Tabs (Cholecalciferol) .... One tablet by mouth every other day 10)  Hyoscyamine Sulfate 0.125 Mg Subl (Hyoscyamine sulfate) .... 2 tablets by mouth every 4 hours as needed for abdominal pain  Patient Instructions: 1)  continue fluids  2)  take the flagyl and cipro for diverticulitis - start now  3)  eat light -- avoid nuts and seeds  4)  if worse or fever - please call or go to the ER  Prescriptions: CIPRO 500 MG TABS (CIPROFLOXACIN HCL) 1 by mouth two times a day for 10 days for diverticulitis  #20 x 0   Entered and Authorized by:   Allena Earing MD   Signed by:   Allena Earing MD on 08/01/2010   Method used:   Electronically to        [facility] (retail)       [address]        [place], [place]  [address]       Ph: [phone]       Fax: [phone]   RxID:   [id number] METRONIDAZOLE 500 MG TABS (METRONIDAZOLE) 1 by mouth three times a day for 10 days  #30 x 0   Entered and Authorized by:   Allena Earing MD   Signed by:   Allena Earing MD on 08/01/2010   Method used:   Electronically to        [facility] (retail)       [address]       [place], [place]  [address]       Ph: [phone]       Fax: [phone]   RxID:   [id number]    Orders Added: 1)  Prescription Created Electronically [G3151] 2)  Est. Patient Level III [76160]    Current Allergies (reviewed today): * MYCINS * MOBIC

## 2010-10-02 NOTE — Miscellaneous (Signed)
Summary: [facility]  Clinical Lists Changes  Medications: Added new medication of MOVIPREP 100 GM  SOLR (PEG-KCL-NACL-NASULF-NA ASC-C) As per prep instructions. - Signed Rx of MOVIPREP 100 GM  SOLR (PEG-KCL-NACL-NASULF-NA ASC-C) As per prep instructions.;  #1 x 0;  Signed;  Entered by: Ulice Dash RN;  Authorized by: Ladene Artist MD [facility];  Method used: Electronically to [facility] [address], [place], [place]  [address], Ph: [phone], Fax: [phone] Observations: Added new observation of ALLERGY REV: Done (02/13/2010 7:54)    Prescriptions: MOVIPREP 100 GM  SOLR (PEG-KCL-NACL-NASULF-NA ASC-C) As per prep instructions.  #1 x 0   Entered by:   Ulice Dash RN   Authorized by:   Ladene Artist MD [facility]   Signed by:   Ulice Dash RN on 02/13/2010   Method used:   Electronically to        [facility] (retail)       [address]       [place], [place]  [address]       Ph: [phone]       Fax: [phone]   RxID:   [id number]

## 2010-10-02 NOTE — Progress Notes (Signed)
Summary: refill request for flexeril  Phone Note Refill Request Message from:  Fax from Pharmacy  Refills Requested: Medication #1:  CYCLOBENZAPRINE HCL 10 MG TABS Take 1/2 tablet by mouth three times a day   Last Refilled: 12/18/2008 Faxed request from [facility], [place].  Initial call taken by: Marty Heck CMA,  Jan 02, 2010 11:50 AM  Follow-up for Phone Call        px written on EMR for call in  Follow-up by: Allena Earing MD,  Jan 02, 2010 11:55 AM  Additional Follow-up for Phone Call Additional follow up Details #1::        Medication phoned to [facility] as instructed. Ozzie Hoyle LPN  Jan 03, 2548 82:64 PM     New/Updated Medications: CYCLOBENZAPRINE HCL 10 MG TABS (CYCLOBENZAPRINE HCL) Take 1/2 tablet by mouth three times a day Prescriptions: CYCLOBENZAPRINE HCL 10 MG TABS (CYCLOBENZAPRINE HCL) Take 1/2 tablet by mouth three times a day  #30 x 1   Entered and Authorized by:   Allena Earing MD   Signed by:   Ozzie Hoyle LPN on 15/83/0940   Method used:   Telephoned to ...       [facility] (retail)       [address]       [place], [place]  [address]       Ph: [phone]       Fax: [phone]   RxID:   [id number]

## 2010-10-02 NOTE — Progress Notes (Signed)
Summary: diverticulitis   Phone Note Call from Patient Call back at Home Phone [phone]   Caller: Patient Call For: Allena Earing MD Summary of Call: Patient states that she is having a diverticulitis attack. She is having left sided abdominal pain, feels very bloated and the pain kept her up last night. She says that she has had several of these episodes over the last couple of years and she knows that is diverticulitis. I advised patient that she would need an appt., but none of the providers have anything. Patient was a little upset because the last time that she called with these same symptoms,we had no appts and she was told to go to Urgent Care. Patient is asking if she can have antibiotic called in. Uses [facility] if needed.  Initial call taken by: Lacretia Nicks,  August 01, 2010 10:57 AM  Follow-up for Phone Call        you can put her in for 4:15 appt with me if Rena can stay   Follow-up by: Allena Earing MD,  August 01, 2010 11:40 AM  Additional Follow-up for Phone Call Additional follow up Details #1::        Patient notified as instructed by telephone. Pt scheduled appt today at 4:15.Ozzie Hoyle LPN  August 01, 4165 12:04 PM

## 2010-10-02 NOTE — Procedures (Signed)
Summary: Colonoscopy  Patient: [place] Note: All result statuses are Final unless otherwise noted.  Tests: (1) Colonoscopy (COL)   COL Colonoscopy           [facility]     [place], [place]  [address]           COLONOSCOPY PROCEDURE REPORT           PATIENT:  Judith [name], Judith [name]  MR#:  [id number]     BIRTHDATE:  11/16/1945, 64 yrs. old  GENDER:  female     ENDOSCOPIST:  Norberto Sorenson T. Fuller Plan, MD, [facility]           PROCEDURE DATE:  03/06/2010     PROCEDURE:  Colonoscopy with biopsy     ASA CLASS:  Class II     INDICATIONS:  1) Routine Risk Screening, Personal history of     breast cancer.     MEDICATIONS:   Versed 7 mg IV     DESCRIPTION OF PROCEDURE:   After the risks benefits and     alternatives of the procedure were thoroughly explained, informed     consent was obtained.  Digital rectal exam was performed and     revealed no abnormalities.   The LB [id number]-[id number] endoscope     was introduced through the anus and advanced to the cecum, which     was identified by both the appendix and ileocecal valve, without     limitations.  The quality of the prep was good, using MoviPrep.     The instrument was then slowly withdrawn as the colon was fully     examined.     <<PROCEDUREIMAGES>>           FINDINGS:  Moderate diverticulosis was found in the sigmoid colon.     A sessile polyp was found in the ascending colon. It was 3 mm in     size. The polyp was removed using cold biopsy forceps.  A sessile     polyp was found in the mid transverse colon. It was 4 mm in size.     The polyp was removed using cold biopsy forceps.  A sessile polyp     was found in the rectum. It was 4 mm in size. The polyp was     removed using cold biopsy forceps.  This was otherwise a normal     examination of the colon.   Retroflexed views in the rectum     revealed no abnormalities.    The time to cecum =  4.75  minutes.     The scope was then withdrawn (time =   10  min) from the patient     and the procedure completed.           COMPLICATIONS:  None           ENDOSCOPIC IMPRESSION:     1) Moderate diverticulosis in the sigmoid colon     2) 3 mm sessile polyp in the ascending colon     3) 4 mm sessile polyp in the mid transverse colon     4) 4 mm sessile polyp in the rectum           RECOMMENDATIONS:     1) If the polyps removed today are adenomatous (pre-cancerous),     you will need a repeat colonoscopy in 5 years. Otherwise you     should  continue to follow colorectal cancer screening guidelines     for "routine risk" patients with colonoscopy in 10 years.    2)     High fiber diet with liberal fluid intake.     3) Await pathology results           Tayte Mcwherter T. Fuller Plan, MD, [facility]           CC: Abner Greenspan, MD           n.     Lorrin MaisNorberto Sorenson T. Brenetta Penny at 03/06/2010 10:07 AM           [place], [place], [phone]  Note: An exclamation mark (!) indicates a result that was not dispersed into the flowsheet. Document Creation Date: 03/06/2010 10:07 AM _______________________________________________________________________  (1) Order result status: Final Collection or observation date-time: 03/06/2010 10:01 Requested date-time:  Receipt date-time:  Reported date-time:  Referring Physician:   Ordering Physician: Lucio Edward [phone]) Specimen Source:  Source: Tawanna Cooler Order Number: [id number] Lab site:   Appended Document: Colonoscopy colon 03/2015  Appended Document: Colonoscopy     Procedures Next Due Date:    Colonoscopy: 03/2015

## 2010-10-02 NOTE — Progress Notes (Signed)
Summary: Diarrhea, vomiting  Phone Note Call from Patient Call back at Home Phone [phone]   Caller: Patient Call For: Allena Earing MD Summary of Call: pt states she has been having vomiting and diarrhea since Sat night, she would like to know what she can take? she can't keep fluids down, only Tea. Please advise Initial call taken by: DeShannon Tamala Julian CMA Deborra Medina),  September 03, 2009 10:05 AM  Follow-up for Phone Call        if this has really been going on since sat and not improved- follow up  if any signs of dehydration (dizzy/ dry mouth / racing heart)- go to ER I recommend sips of clear fluids through the day -- very slow (tea is fine)  I do not recommend diarrhea med at this time   Follow-up by: Allena Earing MD,  September 03, 2009 10:37 AM  Additional Follow-up for Phone Call Additional follow up Details #1::        Advised pt.  She said if she is not any better tomorrow, or worse, she will call for appt. Additional Follow-up by: Marty Heck CMA,  September 03, 2009 12:33 PM

## 2010-10-02 NOTE — Letter (Signed)
Summary: Patient Notice- Polyp Results  [facility]  [address] [place], [place] [address]   Phone: [phone]  Fax: [phone]        March 10, 2010 MRN: [id number]    [facility] [address] [place], [place]  [address]    Dear Ms. Engelbrecht,  I am pleased to inform you that the colon polyp(s) removed during your recent colonoscopy was (were) found to be benign (no cancer detected) upon pathologic examination.  I recommend you have a repeat colonoscopy examination in 5 years to look for recurrent polyps, as having colon polyps increases your risk for having recurrent polyps or even colon cancer in the future.  Should you develop new or worsening symptoms of abdominal pain, bowel habit changes or bleeding from the rectum or bowels, please schedule an evaluation with either your primary care physician or with me.  Continue treatment plan as outlined the day of your exam.  Please call us if you are having persistent problems or have questions about your condition that have not been fully answered at this time.  Sincerely,  Ladene Artist MD [facility]  This letter has been electronically signed by your physician.  Appended Document: Patient Notice- Polyp Results letter mailed.

## 2010-10-02 NOTE — Letter (Signed)
Summary: [facility] Instructions  [facility]  [place],  [address]   Phone: [phone]  Fax: [phone]       Judith [name]    Apr 14, 1946    MRN: [id number]        Procedure Day /Date:  Thursday 03/06/2010     Arrival Time: 8:30 am       Procedure Time: 9:30 am     Location of Procedure:                    _x _  [facility] (4th Floor)                        [place]   Starting 5 days prior to your procedure Saturday 7/2 do not eat nuts, seeds, popcorn, corn, beans, peas,  salads, or any raw vegetables.  Do not take any fiber supplements (e.g. Metamucil, Citrucel, and Benefiber).  THE DAY BEFORE YOUR PROCEDURE         DATE: Wednesday 7/6 1.  Drink clear liquids the entire day-NO SOLID FOOD  2.  Do not drink anything colored red or purple.  Avoid juices with pulp.  No orange juice.  3.  Drink at least 64 oz. (8 glasses) of fluid/clear liquids during the day to prevent dehydration and help the prep work efficiently.  CLEAR LIQUIDS INCLUDE: Water Jello Ice Popsicles Tea (sugar ok, no milk/cream) Powdered fruit flavored drinks Coffee (sugar ok, no milk/cream) Gatorade Juice: apple, white grape, white cranberry  Lemonade Clear bullion, consomm, broth Carbonated beverages (any kind) Strained chicken noodle soup Hard Candy                             4.  In the morning, mix first dose of MoviPrep solution:    Empty 1 Pouch A and 1 Pouch B into the disposable container    Add lukewarm drinking water to the top line of the container. Mix to dissolve    Refrigerate (mixed solution should be used within 24 hrs)  5.  Begin drinking the prep at 5:00 p.m. The MoviPrep container is divided by 4 marks.   Every 15 minutes drink the solution down to the next mark (approximately 8 oz) until the full liter is complete.   6.  Follow completed prep with 16 oz of clear liquid of your choice  (Nothing red or purple).  Continue to drink clear liquids until bedtime.  7.  Before going to bed, mix second dose of MoviPrep solution:    Empty 1 Pouch A and 1 Pouch B into the disposable container    Add lukewarm drinking water to the top line of the container. Mix to dissolve    Refrigerate  THE DAY OF YOUR PROCEDURE      DATE: Thursday 7/7  Beginning at 4:30 a.m. (5 hours before procedure):         1. Every 15 minutes, drink the solution down to the next mark (approx 8 oz) until the full liter is complete.  2. Follow completed prep with 16 oz. of clear liquid of your choice.    3. You may drink clear liquids until 7:30 am (2 HOURS BEFORE PROCEDURE).   MEDICATION INSTRUCTIONS  Unless otherwise instructed, you should take regular prescription medications with a small sip of water   as early as possible the morning of  your procedure.           OTHER INSTRUCTIONS  You will need a responsible adult at least 66 years of age to accompany you and drive you home.   This person must remain in the waiting room during your procedure.  Wear loose fitting clothing that is easily removed.  Leave jewelry and other valuables at home.  However, you may wish to bring a book to read or  an iPod/MP3 player to listen to music as you wait for your procedure to start.  Remove all body piercing jewelry and leave at home.  Total time from sign-in until discharge is approximately 2-3 hours.  You should go home directly after your procedure and rest.  You can resume normal activities the  day after your procedure.  The day of your procedure you should not:   Drive   Make legal decisions   Operate machinery   Drink alcohol   Return to work  You will receive specific instructions about eating, activities and medications before you leave.    The above instructions have been reviewed and explained to me by   Ulice Dash RN  February 13, 2010 8:22 AM    I fully understand and can  verbalize these instructions _____________________________ Date _________

## 2010-10-10 ENCOUNTER — Ambulatory Visit (INDEPENDENT_AMBULATORY_CARE_PROVIDER_SITE_OTHER): Payer: Medicare Other | Admitting: Family Medicine

## 2010-10-10 ENCOUNTER — Encounter: Payer: Self-pay | Admitting: Family Medicine

## 2010-10-10 DIAGNOSIS — J019 Acute sinusitis, unspecified: Secondary | ICD-10-CM

## 2010-10-10 DIAGNOSIS — N952 Postmenopausal atrophic vaginitis: Secondary | ICD-10-CM

## 2010-10-22 NOTE — Assessment & Plan Note (Signed)
Summary: ST/CLE   MEDICARE/CIGNA   Vital Signs:  Patient profile:   65 year old female Height:      60.5 inches Weight:      147.50 pounds BMI:     28.43 Temp:     98.6 degrees F oral Pulse rate:   76 / minute Pulse rhythm:   regular BP sitting:   110 / 72  (left arm) Cuff size:   regular  Vitals Entered By: Ozzie Hoyle LPN (October 11, 5327 11:48 AM) CC: sorethroat for 2 weeks   History of Present Illness: here for 2 weeks of sore throat started as a cold 1 month ago on a cruise  last 2 weeks sore throat comes and goes in intensity - at times severe ears feel plugged up  sinus pain over the eyes  mostly clear nasal discharge  ? allergies   no fever at all   some cough -- more in begiining - mild now / once in a while some phlegm   does have some post nasal drip   rapid strep test negative today     Allergies: 1)  * Mycins 2)  * Mobic  Past History:  Past Medical History: Last updated: 03/11/2009 Allergic rhinitis Diverticulosis, colon past hx of shingles  hyperlipidemia atrophic vaginitis  osteopenia Sliding hiatal hernia GERD Arthritis Rt Breast Cancer Irritable Bowel Syndrome Pneumonia breast cancer (R mastectomy)  Past Surgical History: Last updated: 03/11/2009 D & C (10/1999) Colonoscopy- divertics (12/1999) CT abd and pelvis with contrast- normal except fatty liver, left lung base  ? nodule vs. 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 Eyes:  Denies blurring, discharge, and eye irritation. ENT:  Complains of earache, hoarseness, nasal congestion, postnasal drainage, and sore throat; denies sinus pressure. CV:  Denies chest pain or discomfort and palpitations. Resp:  Complains of cough; denies shortness of breath and wheezing. GI:  Denies diarrhea, nausea, and vomiting. Derm:  Denies itching and rash.  Physical Exam  General:  Well-developed,well-nourished,in no acute distress; alert,appropriate and cooperative throughout examination Head:  normocephalic, atraumatic, and no abnormalities observed.  tender ethmoid and frontal sinuses-mild  Eyes:  vision grossly intact, pupils equal, pupils round, pupils reactive to light, and no injection.   Ears:  R ear normal and L ear normal.   Nose:  nares are injected and congested bilaterally  Mouth:  pharynx pink and moist, no erythema, no exudates, and postnasal drip.   Neck:  supple with full rom and no masses or thyromegally, no JVD or carotid bruit  Chest Wall:  No deformities, masses, or tenderness noted. Lungs:  Normal respiratory effort, chest expands symmetrically. Lungs are clear to auscultation, no crackles or wheezes. Heart:  Normal rate and regular rhythm. S1 and S2 normal without gallop, murmur, click, rub or other  extra sounds. Skin:  Intact without suspicious lesions or rashes Cervical Nodes:  No lymphadenopathy noted Psych:  normal affect, talkative and pleasant    Impression & Recommendations:  Problem # 1:  SINUSITIS, ACUTE (ICD-461.9) Assessment New  with persistant st and post nasal drip and facial pain 1 month after uri  cover with  augmentin recommend sympt care- see pt instructions   pt advised to update me if symptoms worsen or do not improve  The following medications were removed from the medication list:    Metronidazole 500 Mg Tabs (Metronidazole) .Marland Kitchen... 1 by mouth three times a day for 10 days    Cipro 500 Mg Tabs (Ciprofloxacin hcl) .Marland Kitchen... 1 by mouth two times a day for 10 days for diverticulitis Her updated medication list for this problem includes:    Augmentin 875-125 Mg Tabs (Amoxicillin-pot clavulanate) .Marland Kitchen... 1 by mouth two times a day for 10 days for sinus infection  Orders: Prescription Created Electronically [phone]) Rapid Strep (17616)  Complete Medication List: 1)  Cyclobenzaprine Hcl 10 Mg Tabs (Cyclobenzaprine hcl) .... Take 1/2 tablet by mouth three times a day as needed 2)  Naprosyn 500 Mg Tabs (Naproxen) .... Take 1 tablet by mouth twice a day as needed 3)  Prilosec 20 Mg Cpdr (Omeprazole) .... One by mouth once daily as needed 4)  Premarin 0.625 Mg/gm Crea (Estrogens, conjugated) .Marland Kitchen.. 1 small amount per applicator twice weekly intravaginally 5)  Centrum Silver Tabs (Multiple vitamins-minerals) .... One by mouth dialy 6)  Fish Oil Tabs  .... Daily 7)  Cvs Vitamin D 2000 Unit Tabs (Cholecalciferol) .... One tablet by mouth every other day 8)  Hyoscyamine Sulfate 0.125 Mg Subl (Hyoscyamine sulfate) .... 2 tablets by mouth every 4 hours as needed for abdominal pain 9)  Augmentin 875-125 Mg Tabs (Amoxicillin-pot clavulanate) .Marland Kitchen.. 1 by mouth two times a day for 10 days for sinus infection  Patient Instructions: 1)  take the augmentin for sinus infection  2)  I think that is causing your sore throat  3)  drink lots of fluids 4)  tylenol and chloraseptic throat spray help pain 5)  update me in a week if not improving yet  Prescriptions: PREMARIN 0.625 MG/GM  CREA (ESTROGENS, CONJUGATED) 1 small amount per applicator twice weekly intravaginally  #3 months x 3   Entered and Authorized by:   Allena Earing MD   Signed by:   Allena Earing MD on 10/10/2010   Method used:   Electronically to        [facility] (retail)       [address]       [place], [place]  [address]       Ph: [phone]       Fax: [phone]   RxID:   [id number] AUGMENTIN 875-125 MG TABS (AMOXICILLIN-POT CLAVULANATE) 1 by mouth two times a day for 10 days for sinus infection  #20 x 0   Entered and Authorized by:   Allena Earing MD   Signed by:   Allena Earing MD on 10/10/2010   Method used:   Electronically to        [facility] (retail)       [address]       [place], [place]  [address]       Ph: [phone]       Fax: [phone]   RxID:   [id number]    Orders Added: 1)  Prescription Created Electronically [id number] 2)  Est. Patient Level III [85885] 3)  Rapid Strep [02774]    Current Allergies (reviewed today): [facility]  Laboratory Results  Date/Time Received: October 10, 2010 11:49 AM  Date/Time Reported: October 10, 2010 11:49 AM   Other Tests  Rapid Strep: negative

## 2010-11-14 ENCOUNTER — Ambulatory Visit: Payer: Medicare Other | Admitting: Family Medicine

## 2010-11-14 ENCOUNTER — Ambulatory Visit (INDEPENDENT_AMBULATORY_CARE_PROVIDER_SITE_OTHER): Payer: Medicare Other | Admitting: Family Medicine

## 2010-11-14 ENCOUNTER — Encounter: Payer: Self-pay | Admitting: Family Medicine

## 2010-11-14 DIAGNOSIS — L659 Nonscarring hair loss, unspecified: Secondary | ICD-10-CM | POA: Insufficient documentation

## 2010-11-17 LAB — CONVERTED CEMR LAB
MCHC: 32.2 g/dL (ref 30.0–36.0)
Platelets: 281 10*3/uL (ref 150–400)
RDW: 13.6 % (ref 11.5–15.5)
TSH: 2.614 microintl units/mL (ref 0.350–4.500)

## 2010-11-27 NOTE — Assessment & Plan Note (Signed)
Summary: hair falling r/s from dr Diona Browner   Vital Signs:  Patient profile:   65 year old female Weight:      147.50 pounds Temp:     98.5 degrees F oral Pulse rate:   88 / minute Pulse rhythm:   regular BP sitting:   116 / 72  (left arm) Cuff size:   regular  Vitals Entered By: Maudie Mercury Dance CMA Deborra Medina) (November 14, 2010 2:05 PM) CC: Bald patch to left side of head   History of Present Illness: CC: bald spot  went to beauty parlor and had hair done, noticed bald spot.  pt just recently noticed but states hair dresser noticed for about 1 mo.  Never had bald spot before.    h/o scalp psoriasis.  has seen dermatologist - treated with neutrogena T cell? shampoo and liquid.  didn't really help so stopped.  gets hair dyed monthly.  also noticed fingernails have been splitting.  Takes biotin.  No other skin rash, oral lesions, nausea/vomiting, fever/chills.  No new lotions, detergents, soap, conditioners.  Last blood work was in May.  Current Medications (verified): 1)  Cyclobenzaprine Hcl 10 Mg Tabs (Cyclobenzaprine Hcl) .... Take 1/2 Tablet By Mouth Three Times A Day As Needed 2)  Naprosyn 500 Mg Tabs (Naproxen) .... Take 1 Tablet By Mouth Twice A Day As Needed 3)  Prilosec 20 Mg  Cpdr (Omeprazole) .... One By Mouth Once Daily As Needed 4)  Premarin 0.625 Mg/gm  Crea (Estrogens, Conjugated) .Marland Kitchen.. 1 Small Amount Per Applicator Twice Weekly Intravaginally 5)  Centrum Silver  Tabs (Multiple Vitamins-Minerals) .... One By Mouth Dialy 6)  Fish Oil Tabs .... Daily 7)  Cvs Vitamin D 2000 Unit Tabs (Cholecalciferol) .... One Tablet By Mouth Every Other Day 8)  Hyoscyamine Sulfate 0.125 Mg Subl (Hyoscyamine Sulfate) .... 2 Tablets By Mouth Every 4 Hours As Needed For Abdominal Pain 9)  Tamoxifen Citrate 20 Mg Tabs (Tamoxifen Citrate) .Marland Kitchen.. 1 By Mouth Once Daily  Allergies: 1)  * Mycins 2)  * Mobic  Past History:  Past Medical History: Last updated: 03/11/2009 Allergic  rhinitis Diverticulosis, colon past hx of shingles  hyperlipidemia atrophic vaginitis  osteopenia Sliding hiatal hernia GERD Arthritis Rt Breast Cancer Irritable Bowel Syndrome Pneumonia breast cancer (R mastectomy)  Social History: Last updated: 11/17/2007 Marital Status: Married Children: 2 sons Occupation: retired quit smoking in 1970s caffine- 2 cups coffee once daily wine 1-2 glasses a week  gym 3-4 times per week   Review of Systems       per HPI  Physical Exam  General:  Well-developed,well-nourished,in no acute distress; alert,appropriate and cooperative throughout examination Skin:  scalp L posterior to ear with bald spot - erythematous area of alopecia and surrounding telangectasia without hair.  no new follicles.  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  no current new follicles.  treat with topical steroid foam.  if not better, discussed possibility of referral to derm.  has dermatologist, states will call if not better for possible steroid injection. also rec break from dying hair. check TSH to see if any associated autoimmune thyroid.  Orders: T-CBC No Diff ([id number]) T-TSH ([id number])  Complete Medication List: 1)  Cyclobenzaprine Hcl 10 Mg Tabs (Cyclobenzaprine hcl) .... Take 1/2 tablet by mouth three times a day as needed 2)  Naprosyn 500 Mg Tabs (Naproxen) .... Take 1 tablet by  mouth twice a day as needed 3)  Prilosec 20 Mg Cpdr (Omeprazole) .... One by mouth once daily as needed 4)  Premarin 0.625 Mg/gm Crea (Estrogens, conjugated) .Marland Kitchen.. 1 small amount per applicator twice weekly intravaginally 5)  Centrum Silver Tabs (Multiple vitamins-minerals) .... One by mouth dialy 6)  Fish Oil Tabs  .... Daily 7)  Cvs Vitamin D 2000 Unit Tabs (Cholecalciferol) .... One tablet by mouth every other day 8)  Hyoscyamine Sulfate  0.125 Mg Subl (Hyoscyamine sulfate) .... 2 tablets by mouth every 4 hours as needed for abdominal pain 9)  Tamoxifen Citrate 20 Mg Tabs (Tamoxifen citrate) .Marland Kitchen.. 1 by mouth once daily 10)  Clobetasol Propionate 0.05 % Foam (Clobetasol propionate) .... Apply to affected area on scalp twice daily for 4 weeks  Patient Instructions: 1)  Stop hair dying for next few months to see if any improvement. 2)  Trial of steroid cream to hair. 3)  if not better, call derm for follow up. 4)  blood work today to check on thyroid and other reasons for this. Prescriptions: CLOBETASOL PROPIONATE 0.05 % FOAM (CLOBETASOL PROPIONATE) apply to affected area on scalp twice daily for 4 weeks  #1 x 0   Entered and Authorized by:   Ria Bush  MD   Signed by:   Ria Bush  MD on 11/14/2010   Method used:   Electronically to        [facility] (retail)       [address]       [place], [place]  [address]       Ph: [phone]       Fax: [phone]   RxID:   [id number]    Orders Added: 1)  T-CBC No Diff [[id number]] 2)  T-TSH [[id number]] 3)  Est. Patient Level III [82518]    Current Allergies (reviewed today): * MYCINS * MOBIC

## 2010-12-19 ENCOUNTER — Telehealth: Payer: Self-pay | Admitting: *Deleted

## 2010-12-19 DIAGNOSIS — L659 Nonscarring hair loss, unspecified: Secondary | ICD-10-CM

## 2010-12-19 NOTE — Telephone Encounter (Signed)
Pt was seen about a month ago for a bald spot on her head.  This has not gotten any better and she would like referral to dermatologist in [place].

## 2010-12-19 NOTE — Telephone Encounter (Signed)
Ok to refer discussed this as next step at OV.  plz call pt for appt.

## 2011-01-13 ENCOUNTER — Ambulatory Visit: Payer: Self-pay | Admitting: Oncology

## 2011-01-30 ENCOUNTER — Ambulatory Visit: Payer: Self-pay | Admitting: Oncology

## 2011-02-10 ENCOUNTER — Other Ambulatory Visit: Payer: Self-pay | Admitting: *Deleted

## 2011-02-10 MED ORDER — OMEPRAZOLE 20 MG PO TBEC: DELAYED_RELEASE_TABLET | ORAL | Status: DC

## 2011-02-16 ENCOUNTER — Ambulatory Visit (INDEPENDENT_AMBULATORY_CARE_PROVIDER_SITE_OTHER): Payer: Medicare Other | Admitting: Family Medicine

## 2011-02-16 ENCOUNTER — Encounter: Payer: Self-pay | Admitting: Family Medicine

## 2011-02-16 VITALS — BP 116/72 | HR 76 | Temp 98.8°F | Ht 61.75 in | Wt 145.1 lb

## 2011-02-16 DIAGNOSIS — R109 Unspecified abdominal pain: Secondary | ICD-10-CM

## 2011-02-16 DIAGNOSIS — R1030 Lower abdominal pain, unspecified: Secondary | ICD-10-CM

## 2011-02-16 LAB — CBC WITH DIFFERENTIAL/PLATELET
Eosinophils Relative: 1.2 % (ref 0.0–5.0)
HCT: 38.1 % (ref 36.0–46.0)
Hemoglobin: 13 g/dL (ref 12.0–15.0)
Lymphs Abs: 1.8 10*3/uL (ref 0.7–4.0)
Monocytes Relative: 6.4 % (ref 3.0–12.0)
Platelets: 301 10*3/uL (ref 150.0–400.0)
RBC: 4.01 Mil/uL (ref 3.87–5.11)
WBC: 10 10*3/uL (ref 4.5–10.5)

## 2011-02-16 MED ORDER — CIPROFLOXACIN HCL 500 MG PO TABS: 500.0000 mg | ORAL_TABLET | Freq: Two times a day (BID) | ORAL | Status: DC

## 2011-02-16 MED ORDER — METRONIDAZOLE 500 MG PO TABS: 500.0000 mg | ORAL_TABLET | Freq: Three times a day (TID) | ORAL | Status: DC

## 2011-02-16 NOTE — Patient Instructions (Signed)
Clear liquid diet until feeling better, then slowly advance as tolerated. Blood work today. Treat with cipro and flagyl for 10 day course. If not getting better, let us know.  Call us with questions.

## 2011-02-16 NOTE — Progress Notes (Signed)
  Subjective:    Patient ID: Judith [name], female    DOB: December 18, 1945, 65 y.o.   MRN: [id number]  HPI CC: ? Diverticulitis  2 d h/o lower abd pain, painful to walk, feels with movements when driving.  Pain described as sharp cramps (relieved with levsin) now dull ache worse with movement/motion (bumps when riding in car).  No BM changes, last BM this am.  No nausea/vomiting, diarrhea, blood in stool, melena.  Yesterday with fever to 101 (relieved by advil, none since).  No dysuria, urgency, or frequency.  Was a bit constipated on Saturday (normally a bit loose).  Since pain started, has changed diet to liquids/crackers, yogurt.  No new meds, other changes in diet.  No recent nuts/seeds, etc.  H/o diverticulitis, last 07/2010, diverticulosis with last colonsocopy 02/2010 (receives every 5 years). H/o appendectomy  Past Surgical History  Procedure Date  . Dilation and curettage of uterus 10/1999  . Laparoscopic total hysterectomy 10/03    for fibroids/polyp  . Appendectomy   . Breast lumpectomy   . Mastectomy 7/10    right   Review of Systems Per HPI    Objective:   Physical Exam  Nursing note and vitals reviewed. Constitutional: She appears well-developed and well-nourished. No distress.  HENT:  Head: Normocephalic and atraumatic.  Mouth/Throat: Oropharynx is clear and moist. No oropharyngeal exudate.  Eyes: Conjunctivae and EOM are normal. Pupils are equal, round, and reactive to light. No scleral icterus.  Cardiovascular: Normal rate, regular rhythm, normal heart sounds and intact distal pulses.   No murmur heard. Pulmonary/Chest: Effort normal and breath sounds normal. No respiratory distress. She has no wheezes. She has no rales.  Abdominal: Soft. Bowel sounds are normal. She exhibits no distension, no abdominal bruit and no mass. There is no hepatosplenomegaly. There is tenderness (bilat lower quadrants). There is guarding (mild). There is no rigidity, no CVA tenderness,  no tenderness at McBurney's point and negative Murphy's sign.          Assessment & Plan:

## 2011-02-16 NOTE — Assessment & Plan Note (Signed)
bilateral lower quadrant pain, fever to 101 yesterday in h/o recurrent diverticulitis in past. Likely repeat diverticulitis.  No UTI sxs. Exam with some pain but no acute abdomen today. Treat with cipro/flagyl today, pt has done well with this in past. Update [place] if red flags. Clear liquid diet until feeling better then slowly advance diet. Check WBC.

## 2011-03-10 ENCOUNTER — Telehealth: Payer: Self-pay | Admitting: Family Medicine

## 2011-03-10 DIAGNOSIS — K7689 Other specified diseases of liver: Secondary | ICD-10-CM

## 2011-03-10 DIAGNOSIS — K219 Gastro-esophageal reflux disease without esophagitis: Secondary | ICD-10-CM

## 2011-03-10 DIAGNOSIS — M899 Disorder of bone, unspecified: Secondary | ICD-10-CM

## 2011-03-10 DIAGNOSIS — M949 Disorder of cartilage, unspecified: Secondary | ICD-10-CM

## 2011-03-10 DIAGNOSIS — E785 Hyperlipidemia, unspecified: Secondary | ICD-10-CM

## 2011-03-10 NOTE — Telephone Encounter (Signed)
Message copied by Abner Greenspan on Tue Mar 10, 2011  8:33 AM ------      Message from: Marchia Bond      Created: Mon Mar 09, 2011 10:37 AM      Regarding: Cpx labs fri       Please order  future cpx labs for pt's upcomming lab appt.      Thanks      Aniceto Boss

## 2011-03-13 ENCOUNTER — Other Ambulatory Visit (INDEPENDENT_AMBULATORY_CARE_PROVIDER_SITE_OTHER): Payer: Medicare Other | Admitting: Family Medicine

## 2011-03-13 DIAGNOSIS — K7689 Other specified diseases of liver: Secondary | ICD-10-CM

## 2011-03-13 DIAGNOSIS — M949 Disorder of cartilage, unspecified: Secondary | ICD-10-CM

## 2011-03-13 DIAGNOSIS — E785 Hyperlipidemia, unspecified: Secondary | ICD-10-CM

## 2011-03-13 DIAGNOSIS — M899 Disorder of bone, unspecified: Secondary | ICD-10-CM

## 2011-03-13 DIAGNOSIS — K219 Gastro-esophageal reflux disease without esophagitis: Secondary | ICD-10-CM

## 2011-03-13 LAB — COMPREHENSIVE METABOLIC PANEL
Alkaline Phosphatase: 44 U/L (ref 39–117)
BUN: 17 mg/dL (ref 6–23)
Creatinine, Ser: 0.8 mg/dL (ref 0.4–1.2)
Glucose, Bld: 87 mg/dL (ref 70–99)
Sodium: 142 mEq/L (ref 135–145)
Total Bilirubin: 0.5 mg/dL (ref 0.3–1.2)

## 2011-03-13 LAB — LIPID PANEL
Cholesterol: 205 mg/dL — ABNORMAL HIGH (ref 0–200)
Total CHOL/HDL Ratio: 4
Triglycerides: 116 mg/dL (ref 0.0–149.0)
VLDL: 23.2 mg/dL (ref 0.0–40.0)

## 2011-03-13 LAB — CBC WITH DIFFERENTIAL/PLATELET
Basophils Relative: 0.5 % (ref 0.0–3.0)
Eosinophils Relative: 2.9 % (ref 0.0–5.0)
HCT: 40.8 % (ref 36.0–46.0)
Lymphs Abs: 2 10*3/uL (ref 0.7–4.0)
MCV: 94.4 fl (ref 78.0–100.0)
Monocytes Absolute: 0.5 10*3/uL (ref 0.1–1.0)
Platelets: 311 10*3/uL (ref 150.0–400.0)
WBC: 6.5 10*3/uL (ref 4.5–10.5)

## 2011-03-20 ENCOUNTER — Ambulatory Visit (INDEPENDENT_AMBULATORY_CARE_PROVIDER_SITE_OTHER): Payer: Medicare Other | Admitting: Family Medicine

## 2011-03-20 ENCOUNTER — Encounter: Payer: Self-pay | Admitting: Family Medicine

## 2011-03-20 DIAGNOSIS — Z853 Personal history of malignant neoplasm of breast: Secondary | ICD-10-CM

## 2011-03-20 DIAGNOSIS — M949 Disorder of cartilage, unspecified: Secondary | ICD-10-CM

## 2011-03-20 DIAGNOSIS — K5730 Diverticulosis of large intestine without perforation or abscess without bleeding: Secondary | ICD-10-CM

## 2011-03-20 DIAGNOSIS — Z23 Encounter for immunization: Secondary | ICD-10-CM

## 2011-03-20 DIAGNOSIS — E785 Hyperlipidemia, unspecified: Secondary | ICD-10-CM

## 2011-03-20 DIAGNOSIS — K7689 Other specified diseases of liver: Secondary | ICD-10-CM

## 2011-03-20 DIAGNOSIS — M899 Disorder of bone, unspecified: Secondary | ICD-10-CM

## 2011-03-20 MED ORDER — CYCLOBENZAPRINE HCL 10 MG PO TABS: 5.0000 mg | ORAL_TABLET | Freq: Three times a day (TID) | ORAL | Status: DC | PRN

## 2011-03-20 MED ORDER — NAPROXEN 500 MG PO TABS: 500.0000 mg | ORAL_TABLET | Freq: Two times a day (BID) | ORAL | Status: DC | PRN

## 2011-03-20 MED ORDER — CIPROFLOXACIN HCL 500 MG PO TABS: 500.0000 mg | ORAL_TABLET | Freq: Two times a day (BID) | ORAL | Status: DC

## 2011-03-20 MED ORDER — METRONIDAZOLE 500 MG PO TABS: 500.0000 mg | ORAL_TABLET | Freq: Three times a day (TID) | ORAL | Status: DC

## 2011-03-20 NOTE — Assessment & Plan Note (Signed)
Refilled cipro and flagyl for traveling - prn diverticulitis Has not had to use and it expired

## 2011-03-20 NOTE — Patient Instructions (Signed)
If you are interested in shingles vaccine in future - call your insurance company to see how coverage is and call us to schedule Tdap and pneumonia vaccine today  Avoid red meat/ fried foods/ egg yolks/ fatty breakfast meats/ butter, cheese and high fat dairy/ and shellfish   Cholesterol is too high  Schedule fasting labs in 6 months and then follow up  We will schedule bone density test at check out

## 2011-03-20 NOTE — Assessment & Plan Note (Signed)
This is up  Disc low sat fat diet in detail  Rev lab with pt  Re check in 6 months and f/u

## 2011-03-20 NOTE — Assessment & Plan Note (Signed)
Doing well with tamoxifen and had recent breast exam and mam  Doing well

## 2011-03-20 NOTE — Assessment & Plan Note (Signed)
Due for dexa for osteopenia  Disc ca and D  D level ok No fractures  Scheduled dexa

## 2011-03-20 NOTE — Progress Notes (Signed)
Subjective:    Patient ID: Judith [name], female    DOB: 02/08/1946, 65 y.o.   MRN: [id number]  HPI  Here for check up of chronic medical problems and also to review health mt list  Is doing ok overall  Getting older and not liking it -- at 38  Back is bothering her    Zoster- had dz  Is interested in the vaccine  Will call her insurance   Mam 5/10-- breast ca with R mastectomy Just had her follow up  L sided mam this mo was all clear  Still on tamoxifen - has sweats at night -- is tolerating    Pneumovax -interested in that   Td was 10/02  colonosc 7/11 adenomatous polyps- plan to re check 5 years  No bowel changes    Tot hyst for fibroids in past No gyn problems   Just had a breast exam from surgeon and will not want exam today   Lipids with LDL154 Lab Results  Component Value Date   CHOL 205* 03/13/2011   CHOL 191 12/30/2009   CHOL 207* 12/18/2008   Lab Results  Component Value Date   HDL 56.40 03/13/2011   HDL 50.40 12/30/2009   HDL 52.60 12/18/2008   Lab Results  Component Value Date   LDLCALC 122* 12/30/2009   Lab Results  Component Value Date   TRIG 116.0 03/13/2011   TRIG 92.0 12/30/2009   TRIG 87.0 12/18/2008   Lab Results  Component Value Date   CHOLHDL 4 03/13/2011   CHOLHDL 4 12/30/2009   CHOLHDL 4 12/18/2008   Lab Results  Component Value Date   LDLDIRECT 154.0 03/13/2011   LDLDIRECT 134.4 12/18/2008   LDLDIRECT 148.2 11/17/2007   this is up from 2010 Eating a lot of shrimp lately  A lot more cheese lately   Osteopenia dexa 05 and D level is 45 Taking her ca and vit D Bad about exercise -- walks 2 times per week   Wt is stable   bp great   Patient Active Problem List  Diagnoses  . HYPERLIPIDEMIA  . TINNITUS  . ALLERGIC RHINITIS  . GERD  . DIVERTICULOSIS, COLON  . DIVERTICULITIS, ACUTE  . FATTY LIVER DISEASE  . VAGINITIS, ATROPHIC, SYMPTOMATIC  . OSTEOPENIA  . NEOPLASM, MALIGNANT, BREAST, HX OF  . DIVERTICULITIS, HX OF  .  ALOPECIA   Past Medical History  Diagnosis Date  . Allergic rhinitis   . Diverticulosis   . History of shingles   . HLD (hyperlipidemia)   . Atrophic vaginitis   . Osteopenia   . Sliding hiatal hernia   . GERD (gastroesophageal reflux disease)   . Arthritis   . Breast cancer     Right mastectomy  . IBS (irritable bowel syndrome)   . H/O: pneumonia    Past Surgical History  Procedure Date  . Dilation and curettage of uterus 10/1999  . Laparoscopic total hysterectomy 10/03    for fibroids/polyp  . Appendectomy   . Breast lumpectomy   . Mastectomy 7/10    right  . Colonoscopy 02/2010    divertics, rpt 5 years  . Dexa 2005    osteopenia, no change from 2003  . Total abdominal hysterectomy 2003    for fibroids/polyps   History  Substance Use Topics  . Smoking status: Former Smoker    Quit date: 08/31/1968  . Smokeless tobacco: Not on file  . Alcohol Use: Yes     1-2 glasses of wine/weekly  Family History  Problem Relation Age of Onset  . Prostate cancer Father   . Stomach cancer Paternal Grandfather   . Heart attack Paternal Grandmother   . Prostate cancer Brother   . Lymphoma Brother    Allergies  Allergen Reactions  . Meloxicam     REACTION: rash  . Niacin And Related Other (See Comments)    Caused black and blue marks all over body   Current Outpatient Prescriptions on File Prior to Visit  Medication Sig Dispense Refill  . Cholecalciferol (VITAMIN D) 2000 UNITS tablet Take 2,000 Units by mouth every other day.       . conjugated estrogens (PREMARIN) vaginal cream Place 0.5 g vaginally 2 (two) times a week.        . fish oil-omega-3 fatty acids 1000 MG capsule Take 1 g by mouth daily.       . Multiple Vitamin (MULTIVITAMIN) tablet Take 1 tablet by mouth daily.        . hyoscyamine (LEVSIN SL) 0.125 MG SL tablet Place 0.25 mg under the tongue every 4 (four) hours as needed.        . Omeprazole 20 MG TBEC Take one by mouth daily as needed.  30 each  1      Review of Systems Review of Systems  Constitutional: Negative for fever, appetite change, fatigue and unexpected weight change.  Eyes: Negative for pain and visual disturbance.  Respiratory: Negative for cough and shortness of breath.   Cardiovascular: Negative. For cp or sob or palp   Gastrointestinal: Negative for nausea, diarrhea and constipation.  Genitourinary: Negative for urgency and frequency.  Skin: Negative for pallor. no rash  MSK pos for general aches and pains  Neurological: Negative for weakness, light-headedness, numbness and headaches.  Hematological: Negative for adenopathy. Does not bruise/bleed easily.  Psychiatric/Behavioral: Negative for dysphoric mood. The patient is not nervous/anxious.          Objective:   Physical Exam  Constitutional: She appears well-developed and well-nourished. No distress.       overwt and well appearing   HENT:  Head: Normocephalic and atraumatic.  Right Ear: External ear normal.  Left Ear: External ear normal.  Nose: Nose normal.  Mouth/Throat: Oropharynx is clear and moist.  Eyes: Conjunctivae and EOM are normal. Pupils are equal, round, and reactive to light.  Neck: Normal range of motion. Neck supple. No tracheal deviation present. No thyromegaly present.  Cardiovascular: Normal rate, regular rhythm, normal heart sounds and intact distal pulses.   No murmur heard. Pulmonary/Chest: Effort normal and breath sounds normal. No respiratory distress. She has no wheezes.  Abdominal: Soft. Bowel sounds are normal. She exhibits no distension and no mass. There is no tenderness.  Musculoskeletal: Normal range of motion. She exhibits no edema and no tenderness.  Lymphadenopathy:    She has no cervical adenopathy.  Neurological: She is alert. She has normal reflexes. No cranial nerve deficit. Coordination normal.       No kyphosis   Skin: Skin is warm and dry. No rash noted. No erythema. No pallor.  Psychiatric: She has a normal mood  and affect.          Assessment & Plan:

## 2011-04-20 ENCOUNTER — Ambulatory Visit (INDEPENDENT_AMBULATORY_CARE_PROVIDER_SITE_OTHER)
Admission: RE | Admit: 2011-04-20 | Discharge: 2011-04-20 | Disposition: A | Discharge status: Home / Self Care | Payer: Medicare Other | Source: Ambulatory Visit | Attending: Family Medicine | Admitting: Family Medicine

## 2011-04-20 DIAGNOSIS — M899 Disorder of bone, unspecified: Secondary | ICD-10-CM

## 2011-04-20 DIAGNOSIS — M949 Disorder of cartilage, unspecified: Secondary | ICD-10-CM

## 2011-05-12 ENCOUNTER — Encounter: Payer: Self-pay | Admitting: Family Medicine

## 2011-05-12 ENCOUNTER — Encounter: Payer: Self-pay | Admitting: *Deleted

## 2011-06-24 ENCOUNTER — Ambulatory Visit (INDEPENDENT_AMBULATORY_CARE_PROVIDER_SITE_OTHER): Payer: Medicare Other

## 2011-06-24 DIAGNOSIS — Z23 Encounter for immunization: Secondary | ICD-10-CM

## 2011-08-31 ENCOUNTER — Telehealth: Payer: Self-pay | Admitting: Internal Medicine

## 2011-08-31 NOTE — Telephone Encounter (Signed)
Patient called and stated that she has a sinus infection.  Her mucous is green tinged with blood and her teeth hurt, Pressure around eyes and bad headache and her cough isn't going away and wanted to know if you could Rx her something since there is no appointments available today.

## 2011-08-31 NOTE — Telephone Encounter (Signed)
Patient notified as instructed by telephone.

## 2011-08-31 NOTE — Telephone Encounter (Signed)
No avail-- I would recommend urgent care if she is that miserable -- for symptoms I like nasal saline spray and mucinex to help congestion also

## 2011-09-09 ENCOUNTER — Other Ambulatory Visit: Payer: Self-pay | Admitting: *Deleted

## 2011-09-09 MED ORDER — OMEPRAZOLE 20 MG PO TBEC: DELAYED_RELEASE_TABLET | ORAL | Status: DC

## 2011-09-21 ENCOUNTER — Other Ambulatory Visit (INDEPENDENT_AMBULATORY_CARE_PROVIDER_SITE_OTHER): Payer: Medicare Other

## 2011-09-21 DIAGNOSIS — E785 Hyperlipidemia, unspecified: Secondary | ICD-10-CM

## 2011-09-21 LAB — LIPID PANEL
Cholesterol: 177 mg/dL (ref 0–200)
HDL: 52.8 mg/dL (ref >39.00)
LDL Cholesterol: 102 mg/dL — ABNORMAL HIGH (ref 0–99)
Total CHOL/HDL Ratio: 3
Triglycerides: 110 mg/dL (ref 0.0–149.0)

## 2011-09-25 ENCOUNTER — Ambulatory Visit (INDEPENDENT_AMBULATORY_CARE_PROVIDER_SITE_OTHER): Payer: Medicare Other | Admitting: Family Medicine

## 2011-09-25 ENCOUNTER — Encounter: Payer: Self-pay | Admitting: Family Medicine

## 2011-09-25 ENCOUNTER — Ambulatory Visit: Payer: Medicare Other | Admitting: Family Medicine

## 2011-09-25 VITALS — BP 96/62 | HR 80 | Temp 98.2°F | Ht 60.5 in | Wt 153.0 lb

## 2011-09-25 DIAGNOSIS — Z853 Personal history of malignant neoplasm of breast: Secondary | ICD-10-CM

## 2011-09-25 DIAGNOSIS — E785 Hyperlipidemia, unspecified: Secondary | ICD-10-CM

## 2011-09-25 NOTE — Patient Instructions (Signed)
Cholesterol is much better- keep up the good work (and still cut down on the red meat)  Try to get back to exercise 5 days per week when your back is better  Avoid red meat/ fried foods/ egg yolks/ fatty breakfast meats/ butter, cheese and high fat dairy/ and shellfish   Follow up in 6 mo for annual exam with labs prior

## 2011-09-25 NOTE — Assessment & Plan Note (Signed)
Much improved with better diet and urged her to stick with it  Disc goals for lipids and reasons to control them Rev labs with pt Rev low sat fat diet in detail  Will f/u 6 mo for check up and re check then

## 2011-09-25 NOTE — Progress Notes (Signed)
Subjective:    Patient ID: Judith [name], female    DOB: 25-Oct-1945, 66 y.o.   MRN: [id number]  HPI Here for f/u of hyperlipidemia  And also to discuss post mastectomy supplies  Is doing ok overall  Uses silicone breast prosthesiss as well as non silicone breast form and mastectomy bras  These are important to pt for comfort and support as well as back health  The non silicone form is used in the house and allows for greater air flow to chest wall  The prosthesis restores balance and symmetry- for her gait and back  These are medically necessary      Back is out today --has been about a week - gradually getting better   bp 96/62 Wt is up 7 lb with bmi of 29- ? From the holidays  Stopped her job at [employer] not been exercising Will start when her back is better (did not go to the gym)  overwt  Lipids are much better Lab Results  Component Value Date   CHOL 177 09/21/2011   CHOL 205* 03/13/2011   CHOL 191 12/30/2009   Lab Results  Component Value Date   HDL 52.80 09/21/2011   HDL 56.40 03/13/2011   HDL 50.40 12/30/2009   Lab Results  Component Value Date   LDLCALC 102* 09/21/2011   LDLCALC 122* 12/30/2009   Lab Results  Component Value Date   TRIG 110.0 09/21/2011   TRIG 116.0 03/13/2011   TRIG 92.0 12/30/2009   Lab Results  Component Value Date   CHOLHDL 3 09/21/2011   CHOLHDL 4 03/13/2011   CHOLHDL 4 12/30/2009   Lab Results  Component Value Date   LDLDIRECT 154.0 03/13/2011   LDLDIRECT 134.4 12/18/2008   LDLDIRECT 148.2 11/17/2007    Is eating less sat fats - quit shrimp and cheese for the most part  For protien - more lean beef and fish   Patient Active Problem List  Diagnoses  . HYPERLIPIDEMIA  . TINNITUS  . ALLERGIC RHINITIS  . GERD  . DIVERTICULOSIS, COLON  . DIVERTICULITIS, ACUTE  . FATTY LIVER DISEASE  . VAGINITIS, ATROPHIC, SYMPTOMATIC  . OSTEOPENIA  . NEOPLASM, MALIGNANT, BREAST, HX OF  . DIVERTICULITIS, HX OF  . ALOPECIA   Past Medical  History  Diagnosis Date  . Allergic rhinitis   . Diverticulosis   . History of shingles   . HLD (hyperlipidemia)   . Atrophic vaginitis   . Osteopenia   . Sliding hiatal hernia   . GERD (gastroesophageal reflux disease)   . Arthritis   . Breast cancer     Right mastectomy  . IBS (irritable bowel syndrome)   . H/O: pneumonia    Past Surgical History  Procedure Date  . Dilation and curettage of uterus 10/1999  . Laparoscopic total hysterectomy 10/03    for fibroids/polyp  . Appendectomy   . Breast lumpectomy   . Mastectomy 7/10    right  . Colonoscopy 02/2010    divertics, rpt 5 years  . Dexa 2005    osteopenia, no change from 2003  . Total abdominal hysterectomy 2003    for fibroids/polyps   History  Substance Use Topics  . Smoking status: Former Smoker    Quit date: 08/31/1968  . Smokeless tobacco: Not on file  . Alcohol Use: Yes     1-2 glasses of wine/weekly   Family History  Problem Relation Age of Onset  . Prostate cancer Father   . Stomach  cancer Paternal Grandfather   . Heart attack Paternal Grandmother   . Prostate cancer Brother   . Lymphoma Brother    Allergies  Allergen Reactions  . Meloxicam     REACTION: rash  . Niacin And Related Other (See Comments)    Caused black and blue marks all over body   Current Outpatient Prescriptions on File Prior to Visit  Medication Sig Dispense Refill  . Cholecalciferol (VITAMIN D) 2000 UNITS tablet Take 2,000 Units by mouth every other day.       . conjugated estrogens (PREMARIN) vaginal cream Place 0.5 g vaginally 2 (two) times a week.        . cyclobenzaprine (FLEXERIL) 10 MG tablet Take 0.5 tablets (5 mg total) by mouth 3 (three) times daily as needed.  30 tablet  3  . fish oil-omega-3 fatty acids 1000 MG capsule Take 1 g by mouth daily.       . Multiple Vitamin (MULTIVITAMIN) tablet Take 1 tablet by mouth daily.        . naproxen (NAPROSYN) 500 MG tablet Take 1 tablet (500 mg total) by mouth 2 (two) times  daily as needed. With food  30 tablet  3  . Omeprazole 20 MG TBEC Take one by mouth daily as needed.  30 each  1  . TAMOXIFEN CITRATE PO Take 1 tablet by mouth daily.        . hyoscyamine (LEVSIN SL) 0.125 MG SL tablet Place 0.25 mg under the tongue every 4 (four) hours as needed.           Review of Systems Review of Systems  Constitutional: Negative for fever, appetite change, fatigue and unexpected weight change.  Eyes: Negative for pain and visual disturbance.  Respiratory: Negative for cough and shortness of breath.   Cardiovascular: Negative for cp or palpitations    Gastrointestinal: Negative for nausea, diarrhea and constipation.  Genitourinary: Negative for urgency and frequency.  Skin: Negative for pallor or rash   Neurological: Negative for weakness, light-headedness, numbness and headaches.  Hematological: Negative for adenopathy. Does not bruise/bleed easily.  Psychiatric/Behavioral: Negative for dysphoric mood. The patient is not nervous/anxious.          Objective:   Physical Exam  Constitutional: She appears well-developed and well-nourished. No distress.       overwt and well appearing   HENT:  Head: Normocephalic and atraumatic.  Mouth/Throat: Oropharynx is clear and moist.  Eyes: Conjunctivae and EOM are normal. Pupils are equal, round, and reactive to light. No scleral icterus.  Neck: Normal range of motion. Neck supple. No JVD present. No thyromegaly present.  Cardiovascular: Normal rate, regular rhythm and normal heart sounds.  Exam reveals no gallop.   Pulmonary/Chest: Effort normal and breath sounds normal. No respiratory distress. She has no wheezes.  Musculoskeletal: She exhibits no edema and no tenderness.  Lymphadenopathy:    She has no cervical adenopathy.  Neurological: She has normal reflexes. No cranial nerve deficit. She exhibits normal muscle tone. Coordination normal.  Skin: Skin is warm and dry. No rash noted. No pallor.  Psychiatric: She has  a normal mood and affect.          Assessment & Plan:

## 2011-09-27 NOTE — Assessment & Plan Note (Signed)
   Uses silicone breast prosthesiss as well as non silicone breast form and mastectomy bras  These are important to pt for comfort and support as well as back health  The non silicone form is used in the house and allows for greater air flow to chest wall  The prosthesis restores balance and symmetry- for her gait and back  These are medically necessary

## 2011-10-22 ENCOUNTER — Encounter: Payer: Self-pay | Admitting: Family Medicine

## 2011-10-22 ENCOUNTER — Ambulatory Visit (INDEPENDENT_AMBULATORY_CARE_PROVIDER_SITE_OTHER): Payer: Medicare Other | Admitting: Family Medicine

## 2011-10-22 VITALS — BP 120/72 | HR 97 | Temp 98.3°F | Ht 61.75 in | Wt 153.1 lb

## 2011-10-22 DIAGNOSIS — M549 Dorsalgia, unspecified: Secondary | ICD-10-CM

## 2011-10-22 DIAGNOSIS — M62838 Other muscle spasm: Secondary | ICD-10-CM

## 2011-10-22 MED ORDER — METHOCARBAMOL 500 MG PO TABS: 500.0000 mg | ORAL_TABLET | Freq: Three times a day (TID) | ORAL | Status: AC | PRN

## 2011-10-22 NOTE — Progress Notes (Signed)
  Patient Name: Judith [name] Date of Birth: 1946/07/04 Age: 66 y.o. Medical Record Number: [id number] Gender: female Date of Encounter: 10/22/2011  History of Present Illness:  Judith [name] is a 66 y.o. very pleasant female patient who presents with the following:  Back pain:  In the past, has taken some naprosyn and flexeril. Back will spasm and have some pain. Mostly in the mid - back. 20 year history of mid back pain. No acute injury. It has been exacerbated over the last month or so. No numbness or tingling. She has not have any low back pain or neck pain. She routinely uses Naprosyn and cyclobenzaprine.  No incont. 20 + years.  PT, 4-5 years ago. No manipulation or accupuncture.  Breast cancer at, 28 and then had a recurrence. 2 1/2 years ago.    Past Medical History, Surgical History, Social History, Family History, Problem List, Medications, and Allergies have been reviewed and updated if relevant.  Review of Systems:  GEN: No fevers, chills. Nontoxic. Primarily MSK c/o today. MSK: Detailed in the HPI GI: tolerating PO intake without difficulty Neuro: No numbness, parasthesias, or tingling associated. Otherwise the pertinent positives of the ROS are noted above.    Physical Examination: Filed Vitals:   10/22/11 1000  BP: 120/72  Pulse: 97  Temp: 98.3 F (36.8 C)  TempSrc: Oral  Height: 5' 1.75" (1.568 m)  Weight: 153 lb 1.9 oz (69.455 kg)  SpO2: 98%    Body mass index is 28.23 kg/(m^2).   GEN: Well-developed,well-nourished,in no acute distress; alert,appropriate and cooperative throughout examination HEENT: Normocephalic and atraumatic without obvious abnormalities. Ears, externally no deformities PULM: Breathing comfortably in no respiratory distress EXT: No clubbing, cyanosis, or edema PSYCH: Normally interactive. Cooperative during the interview. Pleasant. Friendly and conversant. Not anxious or depressed appearing. Normal, full  affect.  Mild tenderness to palpation in the lower trapezius and rhomboid region. Poor posture with forward sloping scapulas  Range of motion at  the waist: Flexion: normal Extension: normal Lateral bending: normal Rotation: all normal  No echymosis or edema Rises to examination table with no difficulty Gait: non antalgic  Inspection/Deformity: N Paraspinus Tenderness: none  B Ankle Dorsiflexion (L5,4): 5/5 B Great Toe Dorsiflexion (L5,4): 5/5 Heel Walk (L5): WNL Toe Walk (S1): WNL Rise/Squat (L4): WNL  SENSORY B Medial Foot (L4): WNL B Dorsum (L5): WNL B Lateral (S1): WNL Light Touch: WNL Pinprick: WNL  REFLEXES Knee (L4): 2+ Ankle (S1): 2+  B SLR, seated: neg B SLR, supine: neg B FABER: neg B Reverse FABER: neg B Greater Troch: NT B Log Roll: neg B Stork: NT B Sciatic Notch: NT   Assessment and Plan: 1. Back pain  methocarbamol (ROBAXIN) 500 MG tablet, Ambulatory referral to Physical Therapy, DG Thoracic Spine W/Swimmers  2. 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  Formal pt Reviewed basic HEP

## 2011-10-22 NOTE — Patient Instructions (Signed)
REFERRAL: GO THE THE FRONT ROOM AT THE ENTRANCE OF OUR CLINIC, NEAR CHECK IN. ASK FOR MARION. SHE WILL HELP YOU SET UP YOUR REFERRAL. DATE: TIME:  

## 2011-10-23 ENCOUNTER — Ambulatory Visit (INDEPENDENT_AMBULATORY_CARE_PROVIDER_SITE_OTHER)
Admission: RE | Admit: 2011-10-23 | Discharge: 2011-10-23 | Disposition: A | Discharge status: Home / Self Care | Payer: Medicare Other | Source: Ambulatory Visit | Attending: Family Medicine | Admitting: Family Medicine

## 2011-10-23 DIAGNOSIS — M62838 Other muscle spasm: Secondary | ICD-10-CM

## 2011-10-23 DIAGNOSIS — M549 Dorsalgia, unspecified: Secondary | ICD-10-CM

## 2012-02-02 ENCOUNTER — Ambulatory Visit: Payer: Self-pay | Admitting: Oncology

## 2012-02-29 ENCOUNTER — Ambulatory Visit: Payer: Self-pay | Admitting: Oncology

## 2012-03-01 ENCOUNTER — Other Ambulatory Visit: Payer: Self-pay | Admitting: Family Medicine

## 2012-03-02 NOTE — Telephone Encounter (Signed)
[facility] request Omeprazole # 30 x 0. Pt has appt 02/2012 for CPX.

## 2012-03-09 ENCOUNTER — Telehealth: Payer: Self-pay | Admitting: Family Medicine

## 2012-03-09 DIAGNOSIS — K219 Gastro-esophageal reflux disease without esophagitis: Secondary | ICD-10-CM

## 2012-03-09 DIAGNOSIS — K7689 Other specified diseases of liver: Secondary | ICD-10-CM

## 2012-03-09 DIAGNOSIS — M899 Disorder of bone, unspecified: Secondary | ICD-10-CM

## 2012-03-09 DIAGNOSIS — E785 Hyperlipidemia, unspecified: Secondary | ICD-10-CM

## 2012-03-09 DIAGNOSIS — M949 Disorder of cartilage, unspecified: Secondary | ICD-10-CM

## 2012-03-09 NOTE — Telephone Encounter (Signed)
Message copied by Abner Greenspan on Wed Mar 09, 2012  6:11 PM ------      [name]      Created: Tue Mar 08, 2012 12:49 PM      Regarding: Labs for Thursday, 7.11.13       Patient is scheduled for CPX labs, please order future labs, Thanks , Judith [name]

## 2012-03-10 ENCOUNTER — Other Ambulatory Visit (INDEPENDENT_AMBULATORY_CARE_PROVIDER_SITE_OTHER): Payer: Medicare Other

## 2012-03-10 DIAGNOSIS — K7689 Other specified diseases of liver: Secondary | ICD-10-CM

## 2012-03-10 DIAGNOSIS — K219 Gastro-esophageal reflux disease without esophagitis: Secondary | ICD-10-CM

## 2012-03-10 DIAGNOSIS — E785 Hyperlipidemia, unspecified: Secondary | ICD-10-CM

## 2012-03-10 DIAGNOSIS — M899 Disorder of bone, unspecified: Secondary | ICD-10-CM

## 2012-03-10 LAB — CBC WITH DIFFERENTIAL/PLATELET
Eosinophils Relative: 3.9 % (ref 0.0–5.0)
Monocytes Relative: 10.2 % (ref 3.0–12.0)
Neutrophils Relative %: 52.8 % (ref 43.0–77.0)
Platelets: 237 10*3/uL (ref 150.0–400.0)
WBC: 5.7 10*3/uL (ref 4.5–10.5)

## 2012-03-10 LAB — LIPID PANEL
Cholesterol: 186 mg/dL (ref 0–200)
LDL Cholesterol: 111 mg/dL — ABNORMAL HIGH (ref 0–99)

## 2012-03-10 LAB — COMPREHENSIVE METABOLIC PANEL
Albumin: 3.7 g/dL (ref 3.5–5.2)
CO2: 27 mEq/L (ref 19–32)
GFR: 71.87 mL/min (ref >60.00)
Glucose, Bld: 90 mg/dL (ref 70–99)
Potassium: 4.3 mEq/L (ref 3.5–5.1)
Sodium: 140 mEq/L (ref 135–145)
Total Protein: 7.1 g/dL (ref 6.0–8.3)

## 2012-03-11 LAB — VITAMIN D 25 HYDROXY (VIT D DEFICIENCY, FRACTURES): Vit D, 25-Hydroxy: 36 ng/mL (ref 30–89)

## 2012-03-16 ENCOUNTER — Encounter: Payer: Self-pay | Admitting: Family Medicine

## 2012-03-16 ENCOUNTER — Ambulatory Visit (INDEPENDENT_AMBULATORY_CARE_PROVIDER_SITE_OTHER): Payer: Medicare Other | Admitting: Family Medicine

## 2012-03-16 VITALS — BP 114/74 | HR 89 | Temp 98.3°F | Ht 61.0 in | Wt 151.5 lb

## 2012-03-16 DIAGNOSIS — Z853 Personal history of malignant neoplasm of breast: Secondary | ICD-10-CM

## 2012-03-16 DIAGNOSIS — E785 Hyperlipidemia, unspecified: Secondary | ICD-10-CM

## 2012-03-16 DIAGNOSIS — M899 Disorder of bone, unspecified: Secondary | ICD-10-CM

## 2012-03-16 DIAGNOSIS — N3946 Mixed incontinence: Secondary | ICD-10-CM

## 2012-03-16 NOTE — Assessment & Plan Note (Signed)
In pt on tamoxifen after breast ca Re assuring dexa9/12- with bmd in low nl range Disc imp of ca and D and exercise

## 2012-03-16 NOTE — Assessment & Plan Note (Signed)
Pt requested ref to urology to disc poss bladder tack  No uti symptoms  Has had hysterectomy

## 2012-03-16 NOTE — Assessment & Plan Note (Signed)
Lipids are up a bit after eating poorly with guests/ vacation Disc goals for lipids and reasons to control them Rev labs with pt Rev low sat fat diet in detail

## 2012-03-16 NOTE — Patient Instructions (Addendum)
If you are interested in a shingles/zoster vaccine - call your insurance to check on coverage,( you should not get it within 1 month of other vaccines) , then call us for a prescription  for it to take to a pharmacy that gives the shot   Avoid red meat/ fried foods/ egg yolks/ fatty breakfast meats/ butter, cheese and high fat dairy/ and shellfish   We will do urology ref at check out

## 2012-03-16 NOTE — Progress Notes (Signed)
Subjective:    Patient ID: Judith [name], female    DOB: [birth year], 66 y.o.   MRN: [id number]  HPI Here for check up of chronic medical conditions and to review health mt list   Doing ok  Good summer No new medical problems   Wt is down 2 lb with bmi of 28 Is eating healthy diet and trying to get the wt off  Not good about exercise - getting back to it   Hx of breast cancer -on tamoxifen (2 years to go) mammo 7/13- just had it and it was clear- just saw oncol  Had R mastectomy  Zoster status-- has not had the vaccine (had shingles 2 times)   dexa 9/12- low normal range Ca and D- is good about that   colonosc 7/11  Pap 11/06 Then had total hyst for fibroids  No gyn problems  Is interested in a bladder tack for incontinence - stress and urge Does her kegel exercises   Labs ok   Lab Results  Component Value Date   CHOL 186 03/10/2012   CHOL 177 09/21/2011   CHOL 205* 03/13/2011   Lab Results  Component Value Date   HDL 47.90 03/10/2012   HDL 52.80 09/21/2011   HDL 56.40 03/13/2011   Lab Results  Component Value Date   LDLCALC 111* 03/10/2012   LDLCALC 102* 09/21/2011   LDLCALC 122* 12/30/2009   Lab Results  Component Value Date   TRIG 134.0 03/10/2012   TRIG 110.0 09/21/2011   TRIG 116.0 03/13/2011   Lab Results  Component Value Date   CHOLHDL 4 03/10/2012   CHOLHDL 3 09/21/2011   CHOLHDL 4 03/13/2011   Lab Results  Component Value Date   LDLDIRECT 154.0 03/13/2011   LDLDIRECT 134.4 12/18/2008   LDLDIRECT 148.2 11/17/2007    Diet is not as good lately  Lab Results  Component Value Date   ALT 23 03/10/2012   AST 25 03/10/2012   ALKPHOS 44 03/10/2012   BILITOT 0.3 03/10/2012      Chemistry      Component Value Date/Time   NA 140 03/10/2012 0839   K 4.3 03/10/2012 0839   CL 106 03/10/2012 0839   CO2 27 03/10/2012 0839   BUN 15 03/10/2012 0839   CREATININE 0.8 03/10/2012 0839      Component Value Date/Time   CALCIUM 8.7 03/10/2012 0839   ALKPHOS 44  03/10/2012 0839   AST 25 03/10/2012 0839   ALT 23 03/10/2012 0839   BILITOT 0.3 03/10/2012 0839       Patient Active Problem List  Diagnosis  . HYPERLIPIDEMIA  . TINNITUS  . ALLERGIC RHINITIS  . GERD  . DIVERTICULOSIS, COLON  . DIVERTICULITIS, ACUTE  . FATTY LIVER DISEASE  . VAGINITIS, ATROPHIC, SYMPTOMATIC  . OSTEOPENIA  . NEOPLASM, MALIGNANT, BREAST, HX OF  . DIVERTICULITIS, HX OF  . ALOPECIA   Past Medical History  Diagnosis Date  . Allergic rhinitis   . Diverticulosis   . History of shingles   . HLD (hyperlipidemia)   . Atrophic vaginitis   . Osteopenia   . Sliding hiatal hernia   . GERD (gastroesophageal reflux disease)   . Arthritis   . Breast cancer     Right mastectomy  . IBS (irritable bowel syndrome)   . H/O: pneumonia    Past Surgical History  Procedure Date  . Dilation and curettage of uterus 10/1999  . Laparoscopic total hysterectomy 10/03    for fibroids/polyp  .  Appendectomy   . Breast lumpectomy   . Mastectomy 7/10    right  . Colonoscopy 02/2010    divertics, rpt 5 years  . Dexa 2005    osteopenia, no change from 2003  . Total abdominal hysterectomy 2003    for fibroids/polyps   History  Substance Use Topics  . Smoking status: Former Smoker    Quit date: 08/31/1968  . Smokeless tobacco: Not on file  . Alcohol Use: Yes     1-2 glasses of wine/weekly   Family History  Problem Relation Age of Onset  . Prostate cancer Father   . Stomach cancer Paternal Grandfather   . Heart attack Paternal Grandmother   . Prostate cancer Brother   . Lymphoma Brother    Allergies  Allergen Reactions  . Meloxicam     REACTION: rash  . Niacin And Related Other (See Comments)    Caused black and blue marks all over body   Current Outpatient Prescriptions on File Prior to Visit  Medication Sig Dispense Refill  . calcium-vitamin D (OSCAL WITH D) 500-200 MG-UNIT per tablet Take 1 tablet by mouth daily.      . Cholecalciferol (VITAMIN D) 2000 UNITS tablet  Take 2,000 Units by mouth every other day.       . conjugated estrogens (PREMARIN) vaginal cream Place 0.5 g vaginally 2 (two) times a week.        . fish oil-omega-3 fatty acids 1000 MG capsule Take 1 g by mouth daily.       . Multiple Vitamin (MULTIVITAMIN) tablet Take 1 tablet by mouth daily.        . naproxen (NAPROSYN) 500 MG tablet Take 1 tablet (500 mg total) by mouth 2 (two) times daily as needed. With food  30 tablet  3  . omeprazole (PRILOSEC) 20 MG capsule TAKE 1 CAPSULE BY MOUTH EVERY DAY AS    NEEDED.  30 capsule  0  . TAMOXIFEN CITRATE PO Take 1 tablet by mouth daily.           Review of Systems Review of Systems  Constitutional: Negative for fever, appetite change, fatigue and unexpected weight change.  Eyes: Negative for pain and visual disturbance.  Respiratory: Negative for cough and shortness of breath.   Cardiovascular: Negative for cp or palpitations    Gastrointestinal: Negative for nausea, diarrhea and constipation.  Genitourinary: Negative for urgency and frequency.  Skin: Negative for pallor or rash   Neurological: Negative for weakness, light-headedness, numbness and headaches.  Hematological: Negative for adenopathy. Does not bruise/bleed easily.  Psychiatric/Behavioral: Negative for dysphoric mood. The patient is not nervous/anxious.         Objective:   Physical Exam  Constitutional: She appears well-developed and well-nourished. No distress.  HENT:  Head: Normocephalic and atraumatic.  Right Ear: External ear normal.  Left Ear: External ear normal.  Nose: Nose normal.  Mouth/Throat: Oropharynx is clear and moist.  Eyes: Conjunctivae and EOM are normal. Pupils are equal, round, and reactive to light. No scleral icterus.  Neck: Normal range of motion. Neck supple. No JVD present. Carotid bruit is not present. No thyromegaly present.  Cardiovascular: Normal rate, regular rhythm, normal heart sounds and intact distal pulses.  Exam reveals no gallop.     Pulmonary/Chest: Effort normal and breath sounds normal. No respiratory distress. She has no wheezes.  Abdominal: Soft. Bowel sounds are normal. She exhibits no distension, no abdominal bruit and no mass. There is no tenderness.  Musculoskeletal: Normal  range of motion. She exhibits no edema and no tenderness.  Lymphadenopathy:    She has no cervical adenopathy.  Neurological: She is alert. She has normal reflexes. No cranial nerve deficit. She exhibits normal muscle tone. Coordination normal.  Skin: Skin is warm and dry. No rash noted. No erythema. No pallor.  Psychiatric: She has a normal mood and affect.          Assessment & Plan:

## 2012-03-16 NOTE — Assessment & Plan Note (Signed)
utd on mammo and oncol exam  She declined breast exam today due to recent one from Dr Grayland Ormond  Has had R mastectomy Doing well

## 2012-03-31 ENCOUNTER — Ambulatory Visit: Payer: Self-pay | Admitting: Oncology

## 2012-04-08 ENCOUNTER — Other Ambulatory Visit: Payer: Self-pay | Admitting: Family Medicine

## 2012-05-05 ENCOUNTER — Other Ambulatory Visit: Payer: Self-pay | Admitting: Family Medicine

## 2012-05-05 NOTE — Telephone Encounter (Signed)
Request for Omeprazole 7m capsule,. Last filled on 04/08/12. Refill is too early.

## 2012-05-12 ENCOUNTER — Other Ambulatory Visit: Payer: Self-pay | Admitting: Family Medicine

## 2012-06-08 ENCOUNTER — Ambulatory Visit (INDEPENDENT_AMBULATORY_CARE_PROVIDER_SITE_OTHER): Payer: Medicare Other

## 2012-06-08 DIAGNOSIS — Z23 Encounter for immunization: Secondary | ICD-10-CM

## 2012-06-10 ENCOUNTER — Other Ambulatory Visit: Payer: Self-pay | Admitting: Family Medicine

## 2012-07-04 ENCOUNTER — Other Ambulatory Visit: Payer: Self-pay | Admitting: Family Medicine

## 2012-07-04 NOTE — Telephone Encounter (Signed)
Please refil for 3 months, thanks

## 2012-07-04 NOTE — Telephone Encounter (Signed)
Ok to refill 

## 2012-08-09 ENCOUNTER — Ambulatory Visit (INDEPENDENT_AMBULATORY_CARE_PROVIDER_SITE_OTHER)
Admission: RE | Admit: 2012-08-09 | Discharge: 2012-08-09 | Disposition: A | Discharge status: Home / Self Care | Payer: Medicare Other | Source: Ambulatory Visit | Attending: Family Medicine | Admitting: Family Medicine

## 2012-08-09 ENCOUNTER — Ambulatory Visit (INDEPENDENT_AMBULATORY_CARE_PROVIDER_SITE_OTHER): Payer: Medicare Other | Admitting: Family Medicine

## 2012-08-09 ENCOUNTER — Encounter: Payer: Self-pay | Admitting: Family Medicine

## 2012-08-09 VITALS — BP 112/76 | HR 91 | Temp 98.7°F | Ht 61.0 in | Wt 153.8 lb

## 2012-08-09 DIAGNOSIS — D869 Sarcoidosis, unspecified: Secondary | ICD-10-CM

## 2012-08-09 NOTE — Progress Notes (Signed)
Subjective:    Patient ID: Judith [name], female    DOB: [birth year], 66 y.o.   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 HYPERLIPIDEMIA  . TINNITUS  . ALLERGIC RHINITIS  . GERD  . DIVERTICULOSIS, COLON  . DIVERTICULITIS, ACUTE  . FATTY LIVER DISEASE  . VAGINITIS, ATROPHIC, SYMPTOMATIC  . OSTEOPENIA  . NEOPLASM, MALIGNANT, BREAST, HX OF  . DIVERTICULITIS, HX OF  . ALOPECIA  . Mixed incontinence urge and stress   Past Medical History  Diagnosis Date  . Allergic rhinitis   . Diverticulosis   . History of shingles   . HLD (hyperlipidemia)   . Atrophic vaginitis   . Osteopenia   . Sliding hiatal hernia   . GERD (gastroesophageal reflux disease)   . Arthritis   . Breast cancer     Right mastectomy  . IBS (irritable bowel syndrome)   . H/O: pneumonia    Past Surgical History  Procedure Date  . Dilation and curettage of uterus 10/1999  . Laparoscopic total hysterectomy 10/03    for fibroids/polyp  . Appendectomy   . Breast lumpectomy   . Mastectomy 7/10    right  . Colonoscopy 02/2010    divertics, rpt 5 years  . Dexa 2005    osteopenia, no change from 2003  . Total abdominal hysterectomy 2003    for fibroids/polyps   History  Substance Use  Topics  . Smoking status: Former Smoker    Quit date: 08/31/1968  . Smokeless tobacco: Not on file  . Alcohol Use: Yes     Comment: 1-2 glasses of wine/weekly   Family History  Problem Relation Age of Onset  . Prostate cancer Father   . Stomach cancer Paternal Grandfather   . Heart attack Paternal Grandmother   . Prostate cancer Brother   . Lymphoma Brother    Allergies  Allergen Reactions  . Meloxicam     REACTION: rash  . Niacin And Related Other (See Comments)    Caused black and blue marks all over body   Current Outpatient Prescriptions on File Prior to Visit  Medication Sig Dispense Refill  . calcium-vitamin D (OSCAL WITH D) 500-200 MG-UNIT per tablet Take 1 tablet by mouth daily.      . Cholecalciferol (VITAMIN D) 2000 UNITS tablet Take 2,000 Units by mouth every other day.       . cyclobenzaprine (FLEXERIL) 10 MG tablet Take 10 mg by mouth 3 (three) times daily as needed.      . fish oil-omega-3 fatty acids 1000 MG capsule Take 1 g  by mouth daily.       . Multiple Vitamins-Minerals (CENTRUM SILVER ULTRA WOMENS PO) Take by mouth.      . naproxen (NAPROSYN) 500 MG tablet TAKE ONE TABLET BY MOUTH TWICE DAILY    AS NEEDED WITH FOOD  30 tablet  5  . omeprazole (PRILOSEC) 20 MG capsule TAKE 1 CAPSULE BY MOUTH EVERY DAY AS    NEEDED.  30 capsule  3  . TAMOXIFEN CITRATE PO Take 1 tablet by mouth daily.        Marland Kitchen conjugated estrogens (PREMARIN) vaginal cream Place 0.5 g vaginally 2 (two) times a week.            Review of Systems Review of Systems  Constitutional: Negative for fever, appetite change, and unexpected weight change.  Eyes: Negative for pain and visual disturbance.  Respiratory: Negative for cough and shortness of breath.   Cardiovascular: Negative for cp or palpitations    Gastrointestinal: Negative for nausea, diarrhea and constipation.  Genitourinary: Negative for urgency and frequency.  Skin: Negative for pallor or rash pos for areas of allopecia  MSK pos  for some aches and pains without swollen joints   Neurological: Negative for weakness, light-headedness, numbness and headaches.  Hematological: Negative for adenopathy. Does not bruise/bleed easily.  Psychiatric/Behavioral: Negative for dysphoric mood. The patient is not nervous/anxious.         Objective:   Physical Exam  Constitutional: She appears well-developed and well-nourished. No distress.  HENT:  Head: Normocephalic and atraumatic.  Mouth/Throat: Oropharynx is clear and moist.  Eyes: Conjunctivae normal and EOM are normal. Pupils are equal, round, and reactive to light. Right eye exhibits no discharge. Left eye exhibits no discharge. No scleral icterus.  Neck: Normal range of motion. Neck supple. No JVD present. Carotid bruit is not present. No thyromegaly present.  Cardiovascular: Normal rate, regular rhythm and intact distal pulses.  Exam reveals no gallop.   Pulmonary/Chest: Effort normal and breath sounds normal. No respiratory distress. She has no wheezes. She has no rales. She exhibits no tenderness.  Abdominal: Soft. Bowel sounds are normal.  Musculoskeletal: She exhibits no tenderness.  Lymphadenopathy:    She has no cervical adenopathy.  Neurological: She is alert. She has normal reflexes. No cranial nerve deficit. She exhibits normal muscle tone. Coordination normal.  Skin: Skin is warm and dry. No rash noted. No erythema. No pallor.  Psychiatric: She has a normal mood and affect.          Assessment & Plan:

## 2012-08-09 NOTE — Assessment & Plan Note (Signed)
New dx by skin bx with primary symptom of allopecia No fam hx  cxr today-no pulm symptoms Some joint pains on and off- ? If significant Will likely start on plaquenil form her dermatologist dr Nicole Kindred  She will f/u with her opthamologist first  Also ref to rheumatologist for further eval and to answer questions  Consider pulm consult only if abn xray

## 2012-08-09 NOTE — Patient Instructions (Addendum)
Make your appt with eye doctor  We will do referral to a rheumatologist at check out  Chest x ray today

## 2012-08-26 ENCOUNTER — Other Ambulatory Visit: Payer: Self-pay | Admitting: Family Medicine

## 2012-09-15 ENCOUNTER — Other Ambulatory Visit: Payer: Self-pay | Admitting: Family Medicine

## 2012-09-20 ENCOUNTER — Other Ambulatory Visit: Payer: Self-pay | Admitting: Rheumatology

## 2012-09-20 ENCOUNTER — Ambulatory Visit
Admission: RE | Admit: 2012-09-20 | Discharge: 2012-09-20 | Disposition: A | Discharge status: Home / Self Care | Payer: Medicare Other | Source: Ambulatory Visit | Attending: Rheumatology | Admitting: Rheumatology

## 2012-09-20 DIAGNOSIS — R911 Solitary pulmonary nodule: Secondary | ICD-10-CM

## 2012-11-17 ENCOUNTER — Other Ambulatory Visit: Payer: Self-pay | Admitting: Rheumatology

## 2012-11-17 ENCOUNTER — Ambulatory Visit
Admission: RE | Admit: 2012-11-17 | Discharge: 2012-11-17 | Disposition: A | Discharge status: Home / Self Care | Payer: Medicare Other | Source: Ambulatory Visit | Attending: Rheumatology | Admitting: Rheumatology

## 2012-11-17 DIAGNOSIS — W19XXXA Unspecified fall, initial encounter: Secondary | ICD-10-CM

## 2012-11-17 DIAGNOSIS — M545 Low back pain, unspecified: Secondary | ICD-10-CM

## 2013-01-30 ENCOUNTER — Ambulatory Visit (INDEPENDENT_AMBULATORY_CARE_PROVIDER_SITE_OTHER): Payer: Medicare Other | Admitting: Internal Medicine

## 2013-01-30 ENCOUNTER — Encounter: Payer: Self-pay | Admitting: Internal Medicine

## 2013-01-30 VITALS — BP 120/70 | HR 94 | Temp 98.3°F | Wt 151.0 lb

## 2013-01-30 DIAGNOSIS — J019 Acute sinusitis, unspecified: Secondary | ICD-10-CM

## 2013-01-30 MED ORDER — HYDROCODONE-ACETAMINOPHEN 5-325 MG PO TABS: 1.0000 | ORAL_TABLET | Freq: Four times a day (QID) | ORAL | Status: DC | PRN

## 2013-01-30 MED ORDER — AMOXICILLIN 500 MG PO TABS: 1000.0000 mg | ORAL_TABLET | Freq: Two times a day (BID) | ORAL | Status: DC

## 2013-01-30 NOTE — Patient Instructions (Signed)
Please call later in the week if you aren't feeling better.

## 2013-01-30 NOTE — Assessment & Plan Note (Signed)
Symptoms go back a month Will try empiric amoxicillin  Supportive care

## 2013-01-30 NOTE — Progress Notes (Signed)
Subjective:    Patient ID: Judith [name], female    DOB: 04-25-1946, 67 y.o.   MRN: [id number]  HPI Judith [name] to [facility] about a month ago Sinus pressure then and felt she had an infection Tends to happen when she goes out there White nasal discharge, congestion  Never got back to normal Not much congestion Has drainage --post nasal--with secondary cough Pressure under eyes every afternoon  Now feels swollen glands in neck for 3 days Low grade fever off and on-- 99-100 No SOB Slight mucus with cough  Has tried nyquil and cough drops---some help  Current Outpatient Prescriptions on File Prior to Visit  Medication Sig Dispense Refill  . calcium-vitamin D (OSCAL WITH D) 500-200 MG-UNIT per tablet Take 1 tablet by mouth daily.      . Cholecalciferol (VITAMIN D) 2000 UNITS tablet Take 2,000 Units by mouth every other day.       . cyclobenzaprine (FLEXERIL) 10 MG tablet Take 10 mg by mouth 3 (three) times daily as needed.      . fish oil-omega-3 fatty acids 1000 MG capsule Take 1 g by mouth daily.       . GuaiFENesin (HERBAL EXPEC PO) Take 1 tablet by mouth. Pt takes, VSC flush, paw paw, mom juice, IFC, Cordeceps      . Multiple Vitamins-Minerals (CENTRUM SILVER ULTRA WOMENS PO) Take by mouth.      . naproxen (NAPROSYN) 500 MG tablet TAKE ONE TABLET BY MOUTH TWICE DAILY    AS NEEDED WITH FOOD  30 tablet  5  . omeprazole (PRILOSEC) 20 MG capsule TAKE 1 CAPSULE BY MOUTH EVERY DAY AS NEEDED.  30 capsule  5  . PREMARIN vaginal cream INSERT A SMALL AMOUNT USING APPLICATOR  INTRAVAGINALLY 2 TIMES PER WEEK AS      DIRECTED  127.5 g  1  . TAMOXIFEN CITRATE PO Take 1 tablet by mouth daily.         No current facility-administered medications on file prior to visit.    Allergies  Allergen Reactions  . Meloxicam     REACTION: rash  . Niacin And Related Other (See Comments)    Caused black and blue marks all over body    Past Medical History  Diagnosis Date  . Allergic rhinitis   .  Diverticulosis   . History of shingles   . HLD (hyperlipidemia)   . Atrophic vaginitis   . Osteopenia   . Sliding hiatal hernia   . GERD (gastroesophageal reflux disease)   . Arthritis   . Breast cancer     Right mastectomy  . IBS (irritable bowel syndrome)   . H/O: pneumonia     Past Surgical History  Procedure Laterality Date  . Dilation and curettage of uterus  10/1999  . Laparoscopic total hysterectomy  10/03    for fibroids/polyp  . Appendectomy    . Breast lumpectomy    . Mastectomy  7/10    right  . Colonoscopy  02/2010    divertics, rpt 5 years  . Dexa  2005    osteopenia, no change from 2003  . Total abdominal hysterectomy  2003    for fibroids/polyps    Family History  Problem Relation Age of Onset  . Prostate cancer Father   . Stomach cancer Paternal Grandfather   . Heart attack Paternal Grandmother   . Prostate cancer Brother   . Lymphoma Brother     History   Social History  . Marital Status:  Married    Spouse Name: N/A    Number of Children: 2  . Years of Education: N/A   Occupational History  . Retired    Social History Main Topics  . Smoking status: Former Smoker    Quit date: 08/31/1968  . Smokeless tobacco: Never Used  . Alcohol Use: Yes     Comment: 1-2 glasses of wine/weekly  . Drug Use: No  . Sexually Active: Not on file   Other Topics Concern  . Not on file   Social History Narrative   Married      2 sons      Retired      2 cups coffee/daily      Gym 3-4 times/week   Review of Systems No rash No vomiting or diarrhea Appetite is okay     Objective:   Physical Exam  Constitutional: She appears well-developed and well-nourished. No distress.  HENT:  Mouth/Throat: Oropharynx is clear and moist. No oropharyngeal exudate.  Mild bilateral maxillary tenderness TMs negative Moderate nasal inflammation  Neck: Normal range of motion. Neck supple. No thyromegaly present.  No sig nodes  Pulmonary/Chest: Effort normal  and breath sounds normal. No respiratory distress. She has no wheezes. She has no rales.  Lymphadenopathy:    She has no cervical adenopathy.          Assessment & Plan:

## 2013-02-16 ENCOUNTER — Ambulatory Visit: Payer: Self-pay | Admitting: Oncology

## 2013-02-27 ENCOUNTER — Encounter: Payer: Self-pay | Admitting: Family Medicine

## 2013-02-27 ENCOUNTER — Ambulatory Visit (INDEPENDENT_AMBULATORY_CARE_PROVIDER_SITE_OTHER): Payer: Medicare Other | Admitting: Family Medicine

## 2013-02-27 VITALS — BP 118/82 | HR 68 | Temp 98.9°F | Wt 153.0 lb

## 2013-02-27 DIAGNOSIS — K5732 Diverticulitis of large intestine without perforation or abscess without bleeding: Secondary | ICD-10-CM

## 2013-02-27 LAB — CBC WITH DIFFERENTIAL/PLATELET
Basophils Relative: 0.7 % (ref 0.0–3.0)
Eosinophils Relative: 3.3 % (ref 0.0–5.0)
Hemoglobin: 13.2 g/dL (ref 12.0–15.0)
Lymphocytes Relative: 30.1 % (ref 12.0–46.0)
MCV: 93.8 fl (ref 78.0–100.0)
Neutro Abs: 3.2 10*3/uL (ref 1.4–7.7)
Neutrophils Relative %: 56.7 % (ref 43.0–77.0)
RBC: 4.25 Mil/uL (ref 3.87–5.11)
WBC: 5.7 10*3/uL (ref 4.5–10.5)

## 2013-02-27 LAB — COMPREHENSIVE METABOLIC PANEL
Albumin: 3.8 g/dL (ref 3.5–5.2)
CO2: 28 mEq/L (ref 19–32)
Calcium: 8.8 mg/dL (ref 8.4–10.5)
GFR: 67.91 mL/min (ref >60.00)
Glucose, Bld: 81 mg/dL (ref 70–99)
Potassium: 3.9 mEq/L (ref 3.5–5.1)
Sodium: 140 mEq/L (ref 135–145)
Total Protein: 6.7 g/dL (ref 6.0–8.3)

## 2013-02-27 MED ORDER — METRONIDAZOLE 500 MG PO TABS: 500.0000 mg | ORAL_TABLET | Freq: Three times a day (TID) | ORAL | Status: AC

## 2013-02-27 MED ORDER — CIPROFLOXACIN HCL 500 MG PO TABS: 500.0000 mg | ORAL_TABLET | Freq: Two times a day (BID) | ORAL | Status: AC

## 2013-02-27 NOTE — Progress Notes (Signed)
  Subjective:    Patient ID: Judith [name], female    DOB: 12/09/1945, 67 y.o.   MRN: [id number]  HPI  Very pleasant 67 yo female new to me with h/o diverticulosis with 6 d h/o left lower abd pain, sometimes radiates to mid left abdomen.  Pain described as sharp cramps associated with diarrhea.  No blood in stool.  No nausea/vomiting, or melena.   Afebrile. No dysuria.   H/o diverticulitis, last 07/2010, diverticulosis with last colonsocopy 02/2010 (receives every 5 years). H/o appendectomy  Past Surgical History  Procedure Laterality Date  . Dilation and curettage of uterus  10/1999  . Laparoscopic total hysterectomy  10/03    for fibroids/polyp  . Appendectomy    . Breast lumpectomy    . Mastectomy  7/10    right  . Colonoscopy  02/2010    divertics, rpt 5 years  . Dexa  2005    osteopenia, no change from 2003  . Total abdominal hysterectomy  2003    for fibroids/polyps   Review of Systems Per HPI    Objective:   Physical Exam  BP 118/82  Pulse 68  Temp(Src) 98.9 F (37.2 C)  Wt 153 lb (69.4 kg)  BMI 28.92 kg/m2  Nursing note and vitals reviewed. Constitutional: She appears well-developed and well-nourished. No distress.  HENT:  Head: Normocephalic and atraumatic.  Mouth/Throat: Oropharynx is clear and moist. No oropharyngeal exudate.  Eyes: Conjunctivae and EOM are normal. Pupils are equal, round, and reactive to light. No scleral icterus.  Cardiovascular: Normal rate, regular rhythm, normal heart sounds and intact distal pulses.   No murmur heard. Pulmonary/Chest: Effort normal and breath sounds normal. No respiratory distress. She has no wheezes. She has no rales.  Abdominal: Soft. Bowel sounds are normal. She exhibits no distension, no abdominal bruit and no mass. There is no hepatosplenomegaly. There is tenderness (bilat lower quadrants). No guarding.  No rebound. There is no rigidity, no CVA tenderness, no tenderness at McBurney's point and negative Murphy's  sign.        Assessment & Plan:  1. DIVERTICULITIS, ACUTE Very similar to previous episodes without surgical abdomen on exam. Will treat with cipro. Check labs today. If symptoms progress, pursue CT of abdomen. Red flag symptoms discussed requiring emergent evaluation. The patient indicates understanding of these issues and agrees with the plan.  - CBC with Differential - Lipase - Comprehensive metabolic panel

## 2013-02-27 NOTE — Patient Instructions (Addendum)
Nice to meet you. Please take cipro and flagyl as directed.  We will call you with your lab results.  If you feel worse or start running a fever, please go to the ER.

## 2013-02-28 ENCOUNTER — Ambulatory Visit: Payer: Self-pay | Admitting: Oncology

## 2013-03-01 ENCOUNTER — Ambulatory Visit (INDEPENDENT_AMBULATORY_CARE_PROVIDER_SITE_OTHER)
Admission: RE | Admit: 2013-03-01 | Discharge: 2013-03-01 | Disposition: A | Discharge status: Home / Self Care | Payer: Medicare Other | Source: Ambulatory Visit | Attending: Family Medicine | Admitting: Family Medicine

## 2013-03-01 ENCOUNTER — Telehealth: Payer: Self-pay | Admitting: *Deleted

## 2013-03-01 DIAGNOSIS — K5732 Diverticulitis of large intestine without perforation or abscess without bleeding: Secondary | ICD-10-CM

## 2013-03-01 MED ORDER — IOHEXOL 300 MG/ML  SOLN
100.0000 mL | Freq: Once | INTRAMUSCULAR | Status: AC | PRN
  Administered 2013-03-01: 100 mL via INTRAVENOUS

## 2013-03-01 NOTE — Telephone Encounter (Signed)
The diarrhea may be due to the antibiotics-not uncommon, but the continued pain without improvement is worrisome and I would like to get a CT scan today  I am going to go ahead and order that at an imaging facility and tell her that Rosaria Ferries will be calling

## 2013-03-01 NOTE — Telephone Encounter (Signed)
Pt notified to continue the abx and advise of her CT results, f/u appt scheduled with Dr. Glori Bickers for 03/07/13, pt advise to go to ER if sxs worsen

## 2013-03-01 NOTE — Telephone Encounter (Signed)
Pt left v/m; should pt continue to take cipro and flagyl.Please advise.

## 2013-03-01 NOTE — Telephone Encounter (Signed)
Ok- please make sure they know up front to proceed with the referral, thanks

## 2013-03-01 NOTE — Telephone Encounter (Signed)
Pt was seen in the office on Monday, by Dr. Deborra Medina,  for diverticulitis.  She was placed on cipro and flagyl.  This is her 3rd day on the meds and she started with watery diarrhea this morning, x 5.  She says she usually doesn't have diarrhea with these meds.  She still has significant pain.  Please advise on whether she should continue with the meds.  Uses [facility].

## 2013-03-01 NOTE — Telephone Encounter (Signed)
Continue the antibiotics  Her CT report looks ok -no abscess which is good news- though they did see a few swollen lymph nodes (? If related to her current symptoms )- so I want her to follow up with me next week unless a f/u is already scheduled  If symptoms worsen in the meantime please let us know or go to the ER

## 2013-03-01 NOTE — Telephone Encounter (Signed)
Order for CT

## 2013-03-01 NOTE — Telephone Encounter (Signed)
Pt advise me that she is having pain around her ribs and lower back pain now so she agreed with CT, pt said it didn't matter what location she goes to

## 2013-03-06 ENCOUNTER — Encounter: Payer: Self-pay | Admitting: Radiology

## 2013-03-07 ENCOUNTER — Ambulatory Visit (INDEPENDENT_AMBULATORY_CARE_PROVIDER_SITE_OTHER): Payer: Medicare Other | Admitting: Family Medicine

## 2013-03-07 ENCOUNTER — Encounter: Payer: Self-pay | Admitting: Family Medicine

## 2013-03-07 VITALS — BP 124/78 | HR 93 | Temp 98.8°F | Ht 61.0 in | Wt 152.0 lb

## 2013-03-07 DIAGNOSIS — K5730 Diverticulosis of large intestine without perforation or abscess without bleeding: Secondary | ICD-10-CM

## 2013-03-07 DIAGNOSIS — R599 Enlarged lymph nodes, unspecified: Secondary | ICD-10-CM

## 2013-03-07 NOTE — Assessment & Plan Note (Signed)
R obturator and ext iliac LN were borderline enl on her CT -will re check in 4 mo and pt had nl cbc Hx of breast ca and sarcoid No LN palp on exam today Will continue to follow  Copy of CT sent to her rheumatologist and also oncologist as well

## 2013-03-07 NOTE — Progress Notes (Signed)
Subjective:    Patient ID: Judith [name], female    DOB: [birth year], 67 y.o.   MRN: [id number]  HPI  Here for f/u of presumed diverticulitis (cipro/ flagyl) , and to review her recent CT scan Saw Dr Deborra Medina - for abd pain -- and it was left upper abdomen  (higher than she usually has)  The pain is overall better but she still has fair amt of bloating and cannot fit into her pants   Thinks she is getting better    HRADIOLOGY REPORT*  Clinical Data: Abdominal pain.  CT ABDOMEN AND PELVIS WITH CONTRAST  Technique: Multidetector CT imaging of the abdomen and pelvis was  performed following the standard protocol during bolus  administration of intravenous contrast.  Contrast: 162m OMNIPAQUE IOHEXOL 300 MG/ML SOLN  Comparison: None.  Findings: The lung bases are clear of acute findings.  The liver is unremarkable. No focal hepatic lesions or  intrahepatic biliary dilatation. The gallbladder is normal. No  common bile duct dilatation. The pancreas is normal. The spleen  is normal. The adrenal glands and kidneys are unremarkable.  The stomach, duodenum, small bowel and colon are unremarkable.  There is moderate diverticulosis of the sigmoid colon and  descending colon but no findings for acute diverticulitis. No  mesenteric or retroperitoneal mass or adenopathy. The aorta is  normal in caliber. The major branch vessels are patent.  The uterus is surgically absent. No pelvic mass. The bladder is  unremarkable. There are borderline enlarged right obturator and  external iliac lymph nodes. I do not see an obvious cause. No  inguinal adenopathy.  The bony structures are unremarkable.  IMPRESSION:  1. Diverticulosis of the sigmoid colon and descending colon but no  findings for acute diverticulitis.  2. No acute abdominal/pelvic findings or mass lesions.  3. Borderline enlarged right obturator and external iliac lymph  nodes of uncertain significance. A short-term follow-up CT scan is   recommended in 4 months.  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 Acute sinusitis 01/30/2013  . Sarcoid 08/09/2012  . Mixed incontinence urge and stress 03/16/2012  . ALOPECIA 11/14/2010  . GERD 01/24/2009  . NEOPLASM, MALIGNANT, BREAST, HX OF 01/02/2009  . DIVERTICULITIS, ACUTE 12/29/2007  . HYPERLIPIDEMIA 11/17/2007  . VAGINITIS, ATROPHIC, SYMPTOMATIC 11/17/2007  . OSTEOPENIA 11/17/2007  . TINNITUS 09/07/2007  . ALLERGIC RHINITIS 09/07/2007  . DIVERTICULOSIS, COLON 09/07/2007  . FATTY LIVER DISEASE 09/07/2007  . DIVERTICULITIS, HX OF 08/12/2007   Past Medical History  Diagnosis Date  . Allergic rhinitis   . Diverticulosis   . History of shingles   . HLD (hyperlipidemia)   . Atrophic vaginitis   . Osteopenia   . Sliding hiatal hernia   . GERD (gastroesophageal reflux disease)   . Arthritis   . Breast cancer     Right mastectomy  . IBS (irritable bowel syndrome)   . H/O: pneumonia    Past Surgical History  Procedure Laterality Date  . Dilation and curettage of uterus  10/1999  . Laparoscopic total hysterectomy  10/03  for fibroids/polyp  . Appendectomy    . Breast lumpectomy    . Mastectomy  7/10    right  . Colonoscopy  02/2010    divertics, rpt 5 years  . Dexa  2005    osteopenia, no change from 2003  . Total abdominal hysterectomy  2003    for fibroids/polyps   History  Substance Use Topics  . Smoking status: Former Smoker    Quit date: 08/31/1968  . Smokeless tobacco: Never Used  . Alcohol Use: Yes     Comment: 1-2 glasses of wine/weekly   Family History  Problem Relation Age of Onset  .  Prostate cancer Father   . Stomach cancer Paternal Grandfather   . Heart attack Paternal Grandmother   . Prostate cancer Brother   . Lymphoma Brother    Allergies  Allergen Reactions  . Meloxicam     REACTION: rash  . Niacin And Related Other (See Comments)    Caused black and blue marks all over body   Current Outpatient Prescriptions on File Prior to Visit  Medication Sig Dispense Refill  . calcium-vitamin D (OSCAL WITH D) 500-200 MG-UNIT per tablet Take 1 tablet by mouth daily.      . ciprofloxacin (CIPRO) 500 MG tablet Take 1 tablet (500 mg total) by mouth 2 (two) times daily.  20 tablet  0  . cyclobenzaprine (FLEXERIL) 10 MG tablet Take 10 mg by mouth 3 (three) times daily as needed.      . fish oil-omega-3 fatty acids 1000 MG capsule Take 1 g by mouth daily.       Marland Kitchen HYDROcodone-acetaminophen (NORCO/VICODIN) 5-325 MG per tablet Take 1 tablet by mouth every 6 (six) hours as needed for pain.  30 tablet  0  . hydroxychloroquine (PLAQUENIL) 200 MG tablet Take 200 mg by mouth 2 (two) times daily.      . metroNIDAZOLE (FLAGYL) 500 MG tablet Take 1 tablet (500 mg total) by mouth 3 (three) times daily.  30 tablet  0  . Multiple Vitamins-Minerals (CENTRUM SILVER ULTRA WOMENS PO) Take by mouth.      . naproxen (NAPROSYN) 500 MG tablet TAKE ONE TABLET BY MOUTH TWICE DAILY    AS NEEDED WITH FOOD  30 tablet  5  . omeprazole (PRILOSEC) 20 MG capsule TAKE 1 CAPSULE BY MOUTH EVERY DAY AS NEEDED.  30 capsule  5  . PREMARIN vaginal cream INSERT A SMALL AMOUNT USING APPLICATOR  INTRAVAGINALLY 2 TIMES PER WEEK AS      DIRECTED  127.5 g  1  . TAMOXIFEN CITRATE PO Take 1 tablet by mouth daily.         No current facility-administered medications on file prior to visit.      Patient Active Problem List   Diagnosis Date Noted  . Enlarged lymph node 03/07/2013  . Acute sinusitis 01/30/2013  . Sarcoid 08/09/2012  . Mixed incontinence urge and stress 03/16/2012  . ALOPECIA 11/14/2010  . GERD  01/24/2009  . NEOPLASM, MALIGNANT, BREAST, HX OF 01/02/2009  . DIVERTICULITIS, ACUTE 12/29/2007  . HYPERLIPIDEMIA 11/17/2007  . VAGINITIS, ATROPHIC, SYMPTOMATIC 11/17/2007  . OSTEOPENIA 11/17/2007  . TINNITUS 09/07/2007  . ALLERGIC RHINITIS 09/07/2007  . DIVERTICULOSIS, COLON 09/07/2007  . FATTY LIVER DISEASE 09/07/2007  . DIVERTICULITIS, HX OF 08/12/2007   Past Medical History  Diagnosis Date  . Allergic rhinitis   . Diverticulosis   . History of shingles   . HLD (hyperlipidemia)   . Atrophic vaginitis   .  Osteopenia   . Sliding hiatal hernia   . GERD (gastroesophageal reflux disease)   . Arthritis   . Breast cancer     Right mastectomy  . IBS (irritable bowel syndrome)   . H/O: pneumonia    Past Surgical History  Procedure Laterality Date  . Dilation and curettage of uterus  10/1999  . Laparoscopic total hysterectomy  10/03    for fibroids/polyp  . Appendectomy    . Breast lumpectomy    . Mastectomy  7/10    right  . Colonoscopy  02/2010    divertics, rpt 5 years  . Dexa  2005    osteopenia, no change from 2003  . Total abdominal hysterectomy  2003    for fibroids/polyps   History  Substance Use Topics  . Smoking status: Former Smoker    Quit date: 08/31/1968  . Smokeless tobacco: Never Used  . Alcohol Use: Yes     Comment: 1-2 glasses of wine/weekly   Family History  Problem Relation Age of Onset  . Prostate cancer Father   . Stomach cancer Paternal Grandfather   . Heart attack Paternal Grandmother   . Prostate cancer Brother   . Lymphoma Brother    Allergies  Allergen Reactions  . Meloxicam     REACTION: rash  . Niacin And Related Other (See Comments)    Caused black and blue marks all over body   Current Outpatient Prescriptions on File Prior to Visit  Medication Sig Dispense Refill  . calcium-vitamin D (OSCAL WITH D) 500-200 MG-UNIT per tablet Take 1 tablet by mouth daily.      . ciprofloxacin (CIPRO) 500 MG tablet Take 1 tablet (500 mg total)  by mouth 2 (two) times daily.  20 tablet  0  . cyclobenzaprine (FLEXERIL) 10 MG tablet Take 10 mg by mouth 3 (three) times daily as needed.      . fish oil-omega-3 fatty acids 1000 MG capsule Take 1 g by mouth daily.       . hydroxychloroquine (PLAQUENIL) 200 MG tablet Take 200 mg by mouth 2 (two) times daily.      . metroNIDAZOLE (FLAGYL) 500 MG tablet Take 1 tablet (500 mg total) by mouth 3 (three) times daily.  30 tablet  0  . Multiple Vitamins-Minerals (CENTRUM SILVER ULTRA WOMENS PO) Take by mouth.      . naproxen (NAPROSYN) 500 MG tablet TAKE ONE TABLET BY MOUTH TWICE DAILY    AS NEEDED WITH FOOD  30 tablet  5  . omeprazole (PRILOSEC) 20 MG capsule TAKE 1 CAPSULE BY MOUTH EVERY DAY AS NEEDED.  30 capsule  5  . PREMARIN vaginal cream INSERT A SMALL AMOUNT USING APPLICATOR  INTRAVAGINALLY 2 TIMES PER WEEK AS      DIRECTED  127.5 g  1  . TAMOXIFEN CITRATE PO Take 1 tablet by mouth daily.         No current facility-administered medications on file prior to visit.    Review of Systems Review of Systems  Constitutional: Negative for fever, appetite change, fatigue and unexpected weight change.  Eyes: Negative for pain and visual disturbance.  Respiratory: Negative for cough and shortness of breath.   Cardiovascular: Negative for cp or palpitations    Gastrointestinal: Negative for nausea, diarrhea and constipation. pos for L sided abdominal discomfort that has improved  Genitourinary: Negative for urgency and frequency.  Skin: Negative for pallor or rash   Neurological: Negative for weakness, light-headedness, numbness and headaches.  Hematological:  Negative for adenopathy. Does not bruise/bleed easily.  Psychiatric/Behavioral: Negative for dysphoric mood. The patient is not nervous/anxious.         Objective:   Physical Exam  Constitutional: She appears well-developed and well-nourished. No distress.  HENT:  Head: Normocephalic and atraumatic.  Mouth/Throat: Oropharynx is clear and  moist.  Eyes: Conjunctivae and EOM are normal. Pupils are equal, round, and reactive to light. No scleral icterus.  Neck: Normal range of motion. Neck supple. No thyromegaly present.  Cardiovascular: Normal rate, regular rhythm, normal heart sounds and intact distal pulses.  Exam reveals no gallop.   Pulmonary/Chest: Effort normal and breath sounds normal. No respiratory distress. She has no wheezes. She has no rales.  Abdominal: Soft. Bowel sounds are normal. She exhibits no distension and no mass. There is no hepatosplenomegaly. There is tenderness in the right upper quadrant and left upper quadrant. There is no rebound, no guarding and no CVA tenderness.  Musculoskeletal: She exhibits no edema and no tenderness.  Lymphadenopathy:    She has no cervical adenopathy.  Neurological: She is alert.  Skin: Skin is warm and dry. No rash noted. No erythema. No pallor.  Psychiatric: She has a normal mood and affect.          Assessment & Plan:

## 2013-03-07 NOTE — Patient Instructions (Addendum)
I want to re check CT of your abdomen/ pelvis in 4 months - if you have not heard from [place] to schedule / please call  We will send a copy of CT to Dr Charlestine Night and Dr Grayland Ormond  If abdominal pain returns or develop fever or other symptoms please let us know

## 2013-03-07 NOTE — Assessment & Plan Note (Signed)
Pt had a presumed flare of diverticulitis-improved with cipro anf flagyl - with CT several days after starting abx showing diverticuli but no acute infx or abcess  Disc avoidance of nuts and seeds and adv to update if symptoms return or fever or other new events Rev her CT and there were some LN noted that warrant re eval-will plan that in 4 mo

## 2013-03-16 ENCOUNTER — Other Ambulatory Visit: Payer: Self-pay | Admitting: Family Medicine

## 2013-03-17 ENCOUNTER — Encounter: Payer: Self-pay | Admitting: Family Medicine

## 2013-03-31 ENCOUNTER — Ambulatory Visit: Payer: Self-pay | Admitting: Oncology

## 2013-06-15 ENCOUNTER — Ambulatory Visit (INDEPENDENT_AMBULATORY_CARE_PROVIDER_SITE_OTHER): Payer: Medicare Other

## 2013-06-15 DIAGNOSIS — Z23 Encounter for immunization: Secondary | ICD-10-CM

## 2013-07-12 ENCOUNTER — Telehealth: Payer: Self-pay | Admitting: Family Medicine

## 2013-07-12 ENCOUNTER — Ambulatory Visit: Payer: Self-pay | Admitting: Podiatry

## 2013-07-12 DIAGNOSIS — R599 Enlarged lymph nodes, unspecified: Secondary | ICD-10-CM

## 2013-07-12 NOTE — Telephone Encounter (Signed)
Message copied by Abner Greenspan on Wed Jul 12, 2013 10:42 AM ------      Message from: Tammi Sou      Created: Mon Jul 10, 2013 11:53 AM      Regarding: RE: reminder        Pt notified it's time for f/u CT, pt is okay with you putting in referral but she is going on a trip and won't be available until after thanksgiving and wants to call us to get it scheduled after thanksgiving      ----- Message -----         From: Abner Greenspan, MD         Sent: 07/09/2013  11:23 AM           To: Tammi Sou, CMA      Subject: FW: reminder                                             Please let pt know it is time to schedule her 4 month CT re check - ask her if it is ok If we go ahead and refer for that and then the care coordinator will call her       ----- Message -----         From: Abner Greenspan, MD         Sent: 07/08/2013           To: Abner Greenspan, MD      Subject: reminder                                                 Re check CT abd and pelvis for LN in 4 months from 03/07/13             ------

## 2013-07-12 NOTE — Telephone Encounter (Signed)
Pt wants to schedule after thanksgiving

## 2013-07-20 ENCOUNTER — Ambulatory Visit (INDEPENDENT_AMBULATORY_CARE_PROVIDER_SITE_OTHER): Payer: Medicare Other | Admitting: Podiatry

## 2013-07-20 ENCOUNTER — Encounter: Payer: Self-pay | Admitting: Podiatry

## 2013-07-20 ENCOUNTER — Ambulatory Visit (INDEPENDENT_AMBULATORY_CARE_PROVIDER_SITE_OTHER): Payer: Medicare Other

## 2013-07-20 VITALS — BP 103/63 | HR 85 | Resp 16 | Ht 61.0 in | Wt 145.0 lb

## 2013-07-20 DIAGNOSIS — M79671 Pain in right foot: Secondary | ICD-10-CM

## 2013-07-20 DIAGNOSIS — M7660 Achilles tendinitis, unspecified leg: Secondary | ICD-10-CM

## 2013-07-20 DIAGNOSIS — M79609 Pain in unspecified limb: Secondary | ICD-10-CM

## 2013-07-20 DIAGNOSIS — M722 Plantar fascial fibromatosis: Secondary | ICD-10-CM

## 2013-07-20 NOTE — Progress Notes (Signed)
N PAIN L B/L HEEL LEFT IS WORSE D OFF AND ON 13M O SLOWLY C SAME A WALKING,  T STRETCHING, ICE

## 2013-07-20 NOTE — Patient Instructions (Signed)
Achilles Tendinitis  with Rehab Achilles tendinitis is a disorder of the Achilles tendon. The Achilles tendon connects the large calf muscles (Gastrocnemius and Soleus) to the heel bone (calcaneus). This tendon is sometimes called the heel cord. It is important for pushing-off and standing on your toes and is important for walking, running, or jumping. Tendinitis is often caused by overuse and repetitive microtrauma. SYMPTOMS  Pain, tenderness, swelling, warmth, and redness may occur over the Achilles tendon even at rest.  Pain with pushing off, or flexing or extending the ankle.  Pain that is worsened after or during activity. CAUSES   Overuse sometimes seen with rapid increase in exercise programs or in sports requiring running and jumping.  Poor physical conditioning (strength and flexibility or endurance).  Running sports, especially training running down hills.  Inadequate warm-up before practice or play or failure to stretch before participation.  Injury to the tendon. PREVENTION   Warm up and stretch before practice or competition.  Allow time for adequate rest and recovery between practices and competition.  Keep up conditioning.  Keep up ankle and leg flexibility.  Improve or keep muscle strength and endurance.  Improve cardiovascular fitness.  Use proper technique.  Use proper equipment (shoes, skates).  To help prevent recurrence, taping, protective strapping, or an adhesive bandage may be recommended for several weeks after healing is complete. PROGNOSIS   Recovery may take weeks to several months to heal.  Longer recovery is expected if symptoms have been prolonged.  Recovery is usually quicker if the inflammation is due to a direct blow as compared with overuse or sudden strain. RELATED COMPLICATIONS   Healing time will be prolonged if the condition is not correctly treated. The injury must be given plenty of time to heal.  Symptoms can reoccur if  activity is resumed too soon.  Untreated, tendinitis may increase the risk of tendon rupture requiring additional time for recovery and possibly surgery. TREATMENT   The first treatment consists of rest anti-inflammatory medication, and ice to relieve the pain.  Stretching and strengthening exercises after resolution of pain will likely help reduce the risk of recurrence. Referral to a physical therapist or athletic trainer for further evaluation and treatment may be helpful.  A walking boot or cast may be recommended to rest the Achilles tendon. This can help break the cycle of inflammation and microtrauma.  Arch supports (orthotics) may be prescribed or recommended by your caregiver as an adjunct to therapy and rest.  Surgery to remove the inflamed tendon lining or degenerated tendon tissue is rarely necessary and has shown less than predictable results. MEDICATION   Nonsteroidal anti-inflammatory medications, such as aspirin and ibuprofen, may be used for pain and inflammation relief. Do not take within 7 days before surgery. Take these as directed by your caregiver. Contact your caregiver immediately if any bleeding, stomach upset, or signs of allergic reaction occur. Other minor pain relievers, such as acetaminophen, may also be used.  Pain relievers may be prescribed as necessary by your caregiver. Do not take prescription pain medication for longer than 4 to 7 days. Use only as directed and only as much as you need.  Cortisone injections are rarely indicated. Cortisone injections may weaken tendons and predispose to rupture. It is better to give the condition more time to heal than to use them. HEAT AND COLD  Cold is used to relieve pain and reduce inflammation for acute and chronic Achilles tendinitis. Cold should be applied for 10 to 15 minutes  every 2 to 3 hours for inflammation and pain and immediately after any activity that aggravates your symptoms. Use ice packs or an ice  massage.  Heat may be used before performing stretching and strengthening activities prescribed by your caregiver. Use a heat pack or a warm soak. SEEK MEDICAL CARE IF:  Symptoms get worse or do not improve in 2 weeks despite treatment.  New, unexplained symptoms develop. Drugs used in treatment may produce side effects. EXERCISES RANGE OF MOTION (ROM) AND STRETCHING EXERCISES - Achilles Tendinitis  These exercises may help you when beginning to rehabilitate your injury. Your symptoms may resolve with or without further involvement from your physician, physical therapist or athletic trainer. While completing these exercises, remember:   Restoring tissue flexibility helps normal motion to return to the joints. This allows healthier, less painful movement and activity.  An effective stretch should be held for at least 30 seconds.  A stretch should never be painful. You should only feel a gentle lengthening or release in the stretched tissue. STRETCH  Gastroc, Standing   Place hands on wall.  Extend right / left leg, keeping the front knee somewhat bent.  Slightly point your toes inward on your back foot.  Keeping your right / left heel on the floor and your knee straight, shift your weight toward the wall, not allowing your back to arch.  You should feel a gentle stretch in the right / left calf. Hold this position for __________ seconds. Repeat __________ times. Complete this stretch __________ times per day. STRETCH  Soleus, Standing   Place hands on wall.  Extend right / left leg, keeping the other knee somewhat bent.  Slightly point your toes inward on your back foot.  Keep your right / left heel on the floor, bend your back knee, and slightly shift your weight over the back leg so that you feel a gentle stretch deep in your back calf.  Hold this position for __________ seconds. Repeat __________ times. Complete this stretch __________ times per day. STRETCH  Gastrocsoleus,  Standing  Note: This exercise can place a lot of stress on your foot and ankle. Please complete this exercise only if specifically instructed by your caregiver.   Place the ball of your right / left foot on a step, keeping your other foot firmly on the same step.  Hold on to the wall or a rail for balance.  Slowly lift your other foot, allowing your body weight to press your heel down over the edge of the step.  You should feel a stretch in your right / left calf.  Hold this position for __________ seconds.  Repeat this exercise with a slight bend in your knee. Repeat __________ times. Complete this stretch __________ times per day.  STRENGTHENING EXERCISES - Achilles Tendinitis These exercises may help you when beginning to rehabilitate your injury. They may resolve your symptoms with or without further involvement from your physician, physical therapist or athletic trainer. While completing these exercises, remember:   Muscles can gain both the endurance and the strength needed for everyday activities through controlled exercises.  Complete these exercises as instructed by your physician, physical therapist or athletic trainer. Progress the resistance and repetitions only as guided.  You may experience muscle soreness or fatigue, but the pain or discomfort you are trying to eliminate should never worsen during these exercises. If this pain does worsen, stop and make certain you are following the directions exactly. If the pain is still present after  adjustments, discontinue the exercise until you can discuss the trouble with your clinician. STRENGTH - Plantar-flexors   Sit with your right / left leg extended. Holding onto both ends of a rubber exercise band/tubing, loop it around the ball of your foot. Keep a slight tension in the band.  Slowly push your toes away from you, pointing them downward.  Hold this position for __________ seconds. Return slowly, controlling the tension in the  band/tubing. Repeat __________ times. Complete this exercise __________ times per day.  STRENGTH - Plantar-flexors   Stand with your feet shoulder width apart. Steady yourself with a wall or table using as little support as needed.  Keeping your weight evenly spread over the width of your feet, rise up on your toes.*  Hold this position for __________ seconds. Repeat __________ times. Complete this exercise __________ times per day.  *If this is too easy, shift your weight toward your right / left leg until you feel challenged. Ultimately, you may be asked to do this exercise with your right / left foot only. STRENGTH  Plantar-flexors, Eccentric  Note: This exercise can place a lot of stress on your foot and ankle. Please complete this exercise only if specifically instructed by your caregiver.   Place the balls of your feet on a step. With your hands, use only enough support from a wall or rail to keep your balance.  Keep your knees straight and rise up on your toes.  Slowly shift your weight entirely to your right / left toes and pick up your opposite foot. Gently and with controlled movement, lower your weight through your right / left foot so that your heel drops below the level of the step. You will feel a slight stretch in the back of your calf at the end position.  Use the healthy leg to help rise up onto the balls of both feet, then lower weight only on the right / left leg again. Build up to 15 repetitions. Then progress to 3 consecutive sets of 15 repetitions.*  After completing the above exercise, complete the same exercise with a slight knee bend (about 30 degrees). Again, build up to 15 repetitions. Then progress to 3 consecutive sets of 15 repetitions.* Perform this exercise __________ times per day.  *When you easily complete 3 sets of 15, your physician, physical therapist or athletic trainer may advise you to add resistance by wearing a backpack filled with additional  weight. STRENGTH - Plantar Flexors, Seated   Sit on a chair that allows your feet to rest flat on the ground. If necessary, sit at the edge of the chair.  Keeping your toes firmly on the ground, lift your right / left heel as far as you can without increasing any discomfort in your ankle. Repeat __________ times. Complete this exercise __________ times a day. *If instructed by your physician, physical therapist or athletic trainer, you may add ____________________ of resistance by placing a weighted object on your right / left knee. Document Released: 03/18/2005 Document Revised: 11/09/2011 Document Reviewed: 11/29/2008 [facility] Patient Information 2014 [place], [place]. Achilles Tendinitis Tendinitis a swelling and soreness of the tendon. The pain in the tendon (cord-like structure which attaches muscle to bone) is produced by tiny tears and the inflammation present in that tendon. It commonly occurs at the shoulders, heels, and elbows. It is usually caused by overusing the tendon and joint involved. Achilles tendinitis involves the Achilles tendon. This is the large tendon in the back of the leg just  above the foot. It attaches the large muscles of the lower leg to the heel bone (called calcaneus).  This diagnosis (learning what is wrong) is made by examination. X-rays will be generally be normal if only tendinitis is present. HOME CARE INSTRUCTIONS   Apply ice to the injury for 15-20 minutes, 03-04 times per day. Put the ice in a plastic bag and place a towel between the bag of ice and your skin.  Try to avoid use other than gentle range of motion while the tendon is painful. Do not resume use until instructed by your caregiver. Then begin use gradually. Do not increase use to the point of pain. If pain does develop, decrease use and continue the above measures. Gradually increase activities that do not cause discomfort until you gradually achieve normal use.  Only take over-the-counter or  prescription medicines for pain, discomfort, or fever as directed by your caregiver. SEEK MEDICAL CARE IF:   Your pain and swelling increase or pain is uncontrolled with medications.  You develop new, unexplained problems (symptoms) or an increase of the symptoms that brought you to your caregiver.  You develop an inability to move your toes or foot, develop warmth and swelling in your foot, or begin running an unexplained temperature. MAKE SURE YOU:   Understand these instructions.  Will watch your condition.  Will get help right away if you are not doing well or get worse. Document Released: 05/27/2005 Document Revised: 11/09/2011 Document Reviewed: 03/29/2013 [facility] Patient Information 2014 [place].

## 2013-07-20 NOTE — Progress Notes (Signed)
Judith [name] presents today with a chief complaint of a painful left posterior heel into painful right plantar heel  Objective: I have reviewed her past medical history medications and allergies. Strong palpable pulses bilateral. She has tenderness on palpation to the posterior aspect of the Achilles near the insertion site left. Pain on palpation to the plantar fascial insertion site right. Radiographic evaluation confirms soft tissue increase in density of the tendo Achilles at the insertion site of the left heel. Plantar fascial this thickened.  Assessment: Plantar fasciitis right and retrocalcaneal heel spur with tendo Achilles tendinitis left.  Plan: We discussed the etiology pathology conservative versus surgical therapies at this point we injected dexamethasone to the point of maximal tenderness left heel Kenalog to the point of maximal tenderness of the right heel. Put her in a night splint left. Discussed appropriate shoe gear stretching sizes and ice therapy. 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

## 2013-08-03 ENCOUNTER — Other Ambulatory Visit: Payer: Self-pay | Admitting: Family Medicine

## 2013-08-04 ENCOUNTER — Other Ambulatory Visit (INDEPENDENT_AMBULATORY_CARE_PROVIDER_SITE_OTHER): Payer: Medicare Other

## 2013-08-04 DIAGNOSIS — R599 Enlarged lymph nodes, unspecified: Secondary | ICD-10-CM

## 2013-08-04 LAB — BUN: BUN: 15 mg/dL (ref 6–23)

## 2013-08-04 NOTE — Telephone Encounter (Signed)
Electronic refill request, please advise  

## 2013-08-04 NOTE — Telephone Encounter (Signed)
Refilled elect

## 2013-08-10 ENCOUNTER — Ambulatory Visit (INDEPENDENT_AMBULATORY_CARE_PROVIDER_SITE_OTHER)
Admission: RE | Admit: 2013-08-10 | Discharge: 2013-08-10 | Disposition: A | Discharge status: Home / Self Care | Payer: Medicare Other | Source: Ambulatory Visit | Attending: Family Medicine | Admitting: Family Medicine

## 2013-08-10 DIAGNOSIS — R599 Enlarged lymph nodes, unspecified: Secondary | ICD-10-CM

## 2013-08-10 MED ORDER — IOHEXOL 300 MG/ML  SOLN
100.0000 mL | Freq: Once | INTRAMUSCULAR | Status: AC | PRN
  Administered 2013-08-10: 100 mL via INTRAVENOUS

## 2013-08-11 ENCOUNTER — Telehealth: Payer: Self-pay

## 2013-08-11 NOTE — Telephone Encounter (Signed)
Pt request call back when CT of abdomen done 08/10/13 is available.

## 2013-08-15 NOTE — Telephone Encounter (Signed)
Pt viewed the CT results on 08/11/13 via Mychart after she spoke with office

## 2013-08-21 ENCOUNTER — Ambulatory Visit: Payer: Medicare Other | Admitting: Podiatry

## 2013-08-28 ENCOUNTER — Ambulatory Visit (INDEPENDENT_AMBULATORY_CARE_PROVIDER_SITE_OTHER): Payer: Medicare Other | Admitting: Podiatry

## 2013-08-28 ENCOUNTER — Encounter: Payer: Self-pay | Admitting: Podiatry

## 2013-08-28 VITALS — BP 114/69 | HR 92 | Resp 16 | Ht 61.0 in | Wt 145.0 lb

## 2013-08-28 DIAGNOSIS — M7750 Other enthesopathy of unspecified foot: Secondary | ICD-10-CM

## 2013-08-28 DIAGNOSIS — M7660 Achilles tendinitis, unspecified leg: Secondary | ICD-10-CM

## 2013-08-28 DIAGNOSIS — M7752 Other enthesopathy of left foot: Secondary | ICD-10-CM

## 2013-08-28 NOTE — Progress Notes (Signed)
Judith [name] presents today for followup of her tendo Achilles tendinitis and plantar fasciitis. She states that she's 100% better at this point in time. She did a lot of walking was on her feet a lot over the holidays and no pain was experienced.  Objective: Vital signs are stable she is alert and oriented x3. She has no pain on palpation of the bilateral heel with exception of some tenderness overlying the tendo Achilles of her left posterior heel. I do believe this is associated with a very superficial bursitis.  Assessment: Resolving plantar fasciitis some residual bursitis overlying the tendo Achilles left heel.  Plan: Continue conservative therapies of his left heel will followup with her on an as-needed basis.

## 2013-09-21 DIAGNOSIS — Z85828 Personal history of other malignant neoplasm of skin: Secondary | ICD-10-CM | POA: Insufficient documentation

## 2013-09-24 ENCOUNTER — Telehealth: Payer: Self-pay | Admitting: Family Medicine

## 2013-09-24 DIAGNOSIS — K7689 Other specified diseases of liver: Secondary | ICD-10-CM

## 2013-09-24 DIAGNOSIS — Z0000 Encounter for general adult medical examination without abnormal findings: Secondary | ICD-10-CM | POA: Insufficient documentation

## 2013-09-24 DIAGNOSIS — M899 Disorder of bone, unspecified: Secondary | ICD-10-CM

## 2013-09-24 DIAGNOSIS — M949 Disorder of cartilage, unspecified: Secondary | ICD-10-CM

## 2013-09-24 DIAGNOSIS — E785 Hyperlipidemia, unspecified: Secondary | ICD-10-CM

## 2013-09-24 NOTE — Telephone Encounter (Signed)
Message copied by Abner Greenspan on Sun Sep 24, 2013 10:43 AM ------      Message from: Selinda Orion J      Created: Mon Sep 18, 2013 12:04 PM      Regarding: Lab orders for Monday,1.26.15       Patient is scheduled for CPX labs, please order future labs, Thanks , Terri       ------

## 2013-09-25 ENCOUNTER — Other Ambulatory Visit (INDEPENDENT_AMBULATORY_CARE_PROVIDER_SITE_OTHER): Payer: Medicare Other

## 2013-09-25 DIAGNOSIS — Z0000 Encounter for general adult medical examination without abnormal findings: Secondary | ICD-10-CM

## 2013-09-25 DIAGNOSIS — M899 Disorder of bone, unspecified: Secondary | ICD-10-CM

## 2013-09-25 DIAGNOSIS — M949 Disorder of cartilage, unspecified: Secondary | ICD-10-CM

## 2013-09-25 DIAGNOSIS — K7689 Other specified diseases of liver: Secondary | ICD-10-CM

## 2013-09-25 DIAGNOSIS — E785 Hyperlipidemia, unspecified: Secondary | ICD-10-CM

## 2013-09-25 LAB — CBC WITH DIFFERENTIAL/PLATELET
BASOS PCT: 0.4 % (ref 0.0–3.0)
Basophils Absolute: 0 10*3/uL (ref 0.0–0.1)
EOS PCT: 4.8 % (ref 0.0–5.0)
Eosinophils Absolute: 0.3 10*3/uL (ref 0.0–0.7)
HEMATOCRIT: 40.8 % (ref 36.0–46.0)
HEMOGLOBIN: 13.7 g/dL (ref 12.0–15.0)
LYMPHS ABS: 1.9 10*3/uL (ref 0.7–4.0)
Lymphocytes Relative: 28.2 % (ref 12.0–46.0)
MCHC: 33.5 g/dL (ref 30.0–36.0)
MCV: 91.8 fl (ref 78.0–100.0)
MONO ABS: 0.5 10*3/uL (ref 0.1–1.0)
MONOS PCT: 6.9 % (ref 3.0–12.0)
Neutro Abs: 4 10*3/uL (ref 1.4–7.7)
Neutrophils Relative %: 59.7 % (ref 43.0–77.0)
Platelets: 276 10*3/uL (ref 150.0–400.0)
RBC: 4.44 Mil/uL (ref 3.87–5.11)
RDW: 13.8 % (ref 11.5–14.6)
WBC: 6.7 10*3/uL (ref 4.5–10.5)

## 2013-09-25 LAB — COMPREHENSIVE METABOLIC PANEL
ALBUMIN: 3.7 g/dL (ref 3.5–5.2)
ALK PHOS: 44 U/L (ref 39–117)
ALT: 17 U/L (ref 0–35)
AST: 18 U/L (ref 0–37)
BILIRUBIN TOTAL: 0.4 mg/dL (ref 0.3–1.2)
BUN: 18 mg/dL (ref 6–23)
CO2: 27 meq/L (ref 19–32)
Calcium: 8.9 mg/dL (ref 8.4–10.5)
Chloride: 107 mEq/L (ref 96–112)
Creatinine, Ser: 0.9 mg/dL (ref 0.4–1.2)
GFR: 70.57 mL/min (ref >60.00)
GLUCOSE: 93 mg/dL (ref 70–99)
Potassium: 4.3 mEq/L (ref 3.5–5.1)
SODIUM: 141 meq/L (ref 135–145)
TOTAL PROTEIN: 6.8 g/dL (ref 6.0–8.3)

## 2013-09-25 LAB — LIPID PANEL
CHOLESTEROL: 179 mg/dL (ref 0–200)
HDL: 52.5 mg/dL (ref >39.00)
LDL CALC: 104 mg/dL — AB (ref 0–99)
TRIGLYCERIDES: 112 mg/dL (ref 0.0–149.0)
Total CHOL/HDL Ratio: 3
VLDL: 22.4 mg/dL (ref 0.0–40.0)

## 2013-09-25 LAB — TSH: TSH: 3.7 u[IU]/mL (ref 0.35–5.50)

## 2013-09-26 LAB — VITAMIN D 25 HYDROXY (VIT D DEFICIENCY, FRACTURES): VIT D 25 HYDROXY: 46 ng/mL (ref 30–89)

## 2013-10-02 ENCOUNTER — Encounter: Payer: Self-pay | Admitting: Family Medicine

## 2013-10-02 ENCOUNTER — Ambulatory Visit (INDEPENDENT_AMBULATORY_CARE_PROVIDER_SITE_OTHER): Payer: Medicare Other | Admitting: Family Medicine

## 2013-10-02 VITALS — BP 104/72 | HR 88 | Temp 97.6°F | Ht 60.0 in | Wt 148.0 lb

## 2013-10-02 DIAGNOSIS — M899 Disorder of bone, unspecified: Secondary | ICD-10-CM

## 2013-10-02 DIAGNOSIS — E785 Hyperlipidemia, unspecified: Secondary | ICD-10-CM

## 2013-10-02 DIAGNOSIS — D869 Sarcoidosis, unspecified: Secondary | ICD-10-CM

## 2013-10-02 DIAGNOSIS — M949 Disorder of cartilage, unspecified: Secondary | ICD-10-CM

## 2013-10-02 DIAGNOSIS — Z0000 Encounter for general adult medical examination without abnormal findings: Secondary | ICD-10-CM

## 2013-10-02 MED ORDER — OMEPRAZOLE 20 MG PO CPDR: DELAYED_RELEASE_CAPSULE | ORAL | Status: DC

## 2013-10-02 MED ORDER — ESTROGENS, CONJUGATED 0.625 MG/GM VA CREA: TOPICAL_CREAM | VAGINAL | Status: DC

## 2013-10-02 MED ORDER — NAPROXEN 500 MG PO TABS: ORAL_TABLET | ORAL | Status: DC

## 2013-10-02 MED ORDER — CYCLOBENZAPRINE HCL 10 MG PO TABS: 10.0000 mg | ORAL_TABLET | Freq: Three times a day (TID) | ORAL | Status: DC | PRN

## 2013-10-02 NOTE — Assessment & Plan Note (Signed)
Disc goals for lipids and reasons to control them Rev labs with pt Rev low sat fat diet in detail Overall not a bad profile this year -encouraged exercise

## 2013-10-02 NOTE — Patient Instructions (Signed)
If you are interested in a shingles/zoster vaccine - call your insurance to check on coverage,( you should not get it within 1 month of other vaccines) , then call us for a prescription  for it to take to a pharmacy that gives the shot , or make a nurse visit to get it here depending on your coverage  Take care of yourself Get back to weight watchers and good exercise when you can

## 2013-10-02 NOTE — Assessment & Plan Note (Signed)
Reviewed health habits including diet and exercise and skin cancer prevention Reviewed appropriate screening tests for age  Also reviewed health mt list, fam hx and immunization status , as well as social and family history   See HPI Reviewed labs

## 2013-10-02 NOTE — Assessment & Plan Note (Signed)
Last dexa was in low nl range Pt not ready to repeat this yet  D level is in the 40s Disc need for calcium/ vitamin D/ wt bearing exercise and bone density test every 2 y to monitor Disc safety/ fracture risk in detail

## 2013-10-02 NOTE — Progress Notes (Signed)
Pre-visit discussion using our clinic review tool. No additional management support is needed unless otherwise documented below in the visit note.  

## 2013-10-02 NOTE — Assessment & Plan Note (Signed)
Pt has been on prednisone and plaquenil-no significant improvement  She chooses not to return to rheumatology currently  Will continue f/u with derm-symptoms are primarily cutaneous- tx per Dr Nicole Kindred  Will continue to follow and watch for any systemic symptoms

## 2013-10-02 NOTE — Progress Notes (Signed)
Subjective:    Patient ID: Judith [name], female    DOB: 1946-03-29, 68 y.o.   MRN: [id number]  HPI I have personally reviewed the Medicare Annual Wellness questionnaire and have noted 1. The patient's medical and social history 2. Their use of alcohol, tobacco or illicit drugs 3. Their current medications and supplements 4. The patient's functional ability including ADL's, fall risks, home safety risks and hearing or visual             impairment. 5. Diet and physical activities 6. Evidence for depression or mood disorders  The patients weight, height, BMI have been recorded in the chart and visual acuity is per eye clinic.  I have made referrals, counseling and provided education to the patient based review of the above and I have provided the pt with a written personalized care plan for preventive services.  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  Routine anticipatory guidance given to patient.  See health maintenance. Flu shot 10/14  Shingles vaccine- ? Would consider  PNA 7/12  Tetanus 7/12  Colonoscopy 7/11  - 5 year recall  Breast cancer screening mammogram 7/14- normal  No lumps on self exam Hysterectomy -does not need pap smears  Advance directive-has a living will  Cognitive function addressed- see scanned forms- and if abnormal then additional documentation follows. No significant memory problems    PMH and SH reviewed  Meds, vitals, and allergies reviewed.   ROS: See HPI.  Otherwise negative.    dexa 9/12 bmd in low nl range D level is 46 (she will need to decrease sun exposure due to skin cancer)   Hyperlipidemia Lab Results  Component Value Date   CHOL 179 09/25/2013   CHOL 186  03/10/2012   CHOL 177 09/21/2011   Lab Results  Component Value Date   HDL 52.50 09/25/2013   HDL 47.90 03/10/2012   HDL 52.80 09/21/2011   Lab Results  Component Value Date   LDLCALC 104* 09/25/2013   LDLCALC 111* 03/10/2012   LDLCALC 102* 09/21/2011   Lab Results  Component Value Date   TRIG 112.0 09/25/2013   TRIG 134.0 03/10/2012   TRIG 110.0 09/21/2011   Lab Results  Component Value Date   CHOLHDL 3 09/25/2013   CHOLHDL 4 03/10/2012   CHOLHDL 3 09/21/2011   Lab Results  Component Value Date   LDLDIRECT 154.0 03/13/2011   LDLDIRECT 134.4 12/18/2008   LDLDIRECT 148.2 11/17/2007   trying to eat more healthy- less fat  Was on wt watchers  Tries to exercise when she can     Chemistry      Component Value Date/Time   NA 141 09/25/2013 0854   K 4.3 09/25/2013 0854   CL 107 09/25/2013 0854   CO2 27 09/25/2013 0854   BUN 18 09/25/2013 0854   CREATININE 0.9 09/25/2013 0854      Component Value Date/Time   CALCIUM 8.9 09/25/2013 0854   ALKPHOS 44 09/25/2013 0854   AST 18 09/25/2013 0854   ALT 17 09/25/2013 0854   BILITOT 0.4 09/25/2013 0854     glucose 93 Lab Results  Component Value Date   TSH 3.70 09/25/2013  Lab Results  Component Value Date   WBC 6.7 09/25/2013   HGB 13.7 09/25/2013   HCT 40.8 09/25/2013   MCV 91.8 09/25/2013   PLT 276.0 09/25/2013     Patient Active Problem List   Diagnosis Date Noted  . Encounter for Medicare annual wellness exam 09/24/2013  . Enlarged lymph node 03/07/2013  . Sarcoid 08/09/2012  . Mixed incontinence urge and stress 03/16/2012  . ALOPECIA 11/14/2010  . GERD 01/24/2009  . NEOPLASM, MALIGNANT, BREAST, HX OF 01/02/2009  . DIVERTICULITIS, ACUTE 12/29/2007  . HYPERLIPIDEMIA 11/17/2007  . VAGINITIS, ATROPHIC, SYMPTOMATIC 11/17/2007  . OSTEOPENIA 11/17/2007  . TINNITUS 09/07/2007  . ALLERGIC RHINITIS 09/07/2007  . DIVERTICULOSIS, COLON 09/07/2007  . FATTY LIVER DISEASE 09/07/2007  . DIVERTICULITIS, HX OF 08/12/2007   Past Medical  History  Diagnosis Date  . Allergic rhinitis   . Diverticulosis   . History of shingles   . HLD (hyperlipidemia)   . Atrophic vaginitis   . Osteopenia   . Sliding hiatal hernia   . GERD (gastroesophageal reflux disease)   . Arthritis   . Breast cancer     Right mastectomy  . IBS (irritable bowel syndrome)   . H/O: pneumonia    Past Surgical History  Procedure Laterality Date  . Dilation and curettage of uterus  10/1999  . Laparoscopic total hysterectomy  10/03    for fibroids/polyp  . Appendectomy    . Breast lumpectomy    . Mastectomy  7/10    right  . Colonoscopy  02/2010    divertics, rpt 5 years  . Dexa  2005    osteopenia, no change from 2003  . Total abdominal hysterectomy  2003    for fibroids/polyps   History  Substance Use Topics  . Smoking status: Former Smoker    Quit date: 08/31/1968  . Smokeless tobacco: Never Used  . Alcohol Use: Yes     Comment: 1-2 glasses of wine/weekly   Family History  Problem Relation Age of Onset  . Prostate cancer Father   . Stomach cancer Paternal Grandfather   . Heart attack Paternal Grandmother   . Prostate cancer Brother   . Lymphoma Brother    Allergies  Allergen Reactions  . Meloxicam     REACTION: rash  . Niacin And Related Other (See Comments)    Caused black and blue marks all over body   Current Outpatient Prescriptions on File Prior to Visit  Medication Sig Dispense Refill  . calcium-vitamin D (OSCAL WITH D) 500-200 MG-UNIT per tablet Take 1 tablet by mouth daily.      . cyclobenzaprine (FLEXERIL) 10 MG tablet Take 10 mg by mouth 3 (three) times daily as needed.      . fish oil-omega-3 fatty acids 1000 MG capsule Take 1 g by mouth daily.       . Multiple Vitamins-Minerals (CENTRUM SILVER ULTRA WOMENS PO) Take by mouth.      . naproxen (NAPROSYN) 500 MG tablet TAKE ONE TABLET BY MOUTH TWICE DAILY    AS NEEDED WITH FOOD  30 tablet  5  . omeprazole (PRILOSEC) 20 MG capsule TAKE 1 CAPSULE BY MOUTH EVERY DAY AS  NEEDED.  30 capsule  5  . PREMARIN vaginal cream INSERT A SMALL AMOUNT USING APPLICATOR  INTRAVAGINALLY 2 TIMES PER WEEK AS      DIRECTED  127.5 g  1  . TAMOXIFEN CITRATE PO Take 1 tablet by mouth daily.  No current facility-administered medications on file prior to visit.     Review of Systems    Review of Systems  Constitutional: Negative for fever, appetite change, fatigue and unexpected weight change.  Eyes: Negative for pain and visual disturbance.  Respiratory: Negative for cough and shortness of breath.   Cardiovascular: Negative for cp or palpitations    Gastrointestinal: Negative for nausea, diarrhea and constipation.  Genitourinary: Negative for urgency and frequency.  Skin: Negative for pallor and pos for skin lesions of sarcoidosis  Neurological: Negative for weakness, light-headedness, numbness and headaches.  Hematological: Negative for adenopathy. Does not bruise/bleed easily.  Psychiatric/Behavioral: Negative for dysphoric mood. The patient is not nervous/anxious.      Objective:   Physical Exam  Constitutional: She appears well-developed and well-nourished. No distress.  HENT:  Head: Normocephalic and atraumatic.  Right Ear: External ear normal.  Left Ear: External ear normal.  Nose: Nose normal.  Mouth/Throat: Oropharynx is clear and moist.  Eyes: Conjunctivae and EOM are normal. Pupils are equal, round, and reactive to light. Right eye exhibits no discharge. Left eye exhibits no discharge. No scleral icterus.  Neck: Normal range of motion. Neck supple. No JVD present. Carotid bruit is not present. No thyromegaly present.  Cardiovascular: Normal rate, regular rhythm, normal heart sounds and intact distal pulses.  Exam reveals no gallop.   Pulmonary/Chest: Effort normal and breath sounds normal. No respiratory distress. She has no wheezes. She has no rales.  Abdominal: Soft. Bowel sounds are normal. She exhibits no distension and no mass. There is no  tenderness.  Musculoskeletal: She exhibits no edema and no tenderness.  Lymphadenopathy:    She has no cervical adenopathy.  Neurological: She is alert. She has normal reflexes. No cranial nerve deficit. She exhibits normal muscle tone. Coordination normal.  Skin: Skin is warm and dry. No rash noted. No erythema. No pallor.  Bandage on back of head from recent procedure   Psychiatric: She has a normal mood and affect.          Assessment & Plan:

## 2013-10-04 ENCOUNTER — Telehealth: Payer: Self-pay | Admitting: *Deleted

## 2013-10-04 NOTE — Telephone Encounter (Signed)
Received form for prior auth for CYCLOBENZAPRINE 10 mg tab take 1 three times daily. TIZANIDINE OR BACLOFEN are preferred, send for prior auth?

## 2013-10-04 NOTE — Telephone Encounter (Signed)
Please ask her if she has tried either of these and if not- will need to change

## 2013-10-05 NOTE — Telephone Encounter (Signed)
Pt said she hasn't tried tizanidine or baclofen before. Pt said in the past Dr. Lorelei Pont prescribed her Robaxin and that worked better then the flexeril. Pt wanted to know if you could prescribe her Robaxin instead of flexeril, but if her insurance doesn't cover the Robaxin then she is willing to try tizanidine or baclofen

## 2013-10-05 NOTE — Telephone Encounter (Signed)
Left voicemail advising pt to call insurance company to see if Rx is covered and for her to let us know.

## 2013-10-05 NOTE — Telephone Encounter (Signed)
Please ask her to call her ins to see if they cover robaxin before we px it , thanks

## 2013-10-06 NOTE — Telephone Encounter (Signed)
Pt said she check with her ins and flexeril and Robaxin say they are both covered so she doesn't think she needs to change her meds, I advise pt that pharmacy sent [place] a fax saying a PA is needed for the flexeril so if her ins is saying it's covered she may want to check with her pharmacy to see what the problems is.

## 2013-11-22 ENCOUNTER — Encounter: Payer: Self-pay | Admitting: Family Medicine

## 2013-11-22 ENCOUNTER — Ambulatory Visit (INDEPENDENT_AMBULATORY_CARE_PROVIDER_SITE_OTHER): Payer: Medicare Other | Admitting: Family Medicine

## 2013-11-22 VITALS — BP 118/78 | HR 100 | Temp 100.0°F | Ht 60.0 in | Wt 148.2 lb

## 2013-11-22 DIAGNOSIS — J209 Acute bronchitis, unspecified: Secondary | ICD-10-CM | POA: Insufficient documentation

## 2013-11-22 MED ORDER — AZITHROMYCIN 250 MG PO TABS: ORAL_TABLET | ORAL | Status: DC

## 2013-11-22 MED ORDER — HYDROCODONE-HOMATROPINE 5-1.5 MG/5ML PO SYRP: 5.0000 mL | ORAL_SOLUTION | Freq: Three times a day (TID) | ORAL | Status: DC | PRN

## 2013-11-22 NOTE — Patient Instructions (Signed)
Drink lots of fluids and rest  Take the zithromax as directed  Try hycodan with caution for cough- it will sedate  Update if not starting to improve in a week or if worsening

## 2013-11-22 NOTE — Assessment & Plan Note (Signed)
With fever for 6 days May have started with influenza-too late to tell Cover with zithromax Hycodan for cough with caution Disc symptomatic care - see instructions on AVS Update if not starting to improve in a week or if worsening

## 2013-11-22 NOTE — Progress Notes (Signed)
Subjective:    Patient ID: Judith [name], female    DOB: 1945/11/07, 68 y.o.   MRN: [id number]  HPI Here with uri symptoms  Started with low grade fever and ST on Friday   (the ST is better) Feels achey and exhausted  Head hurts  Eyes hurt and water   Runny nose Coughing - prod - white in color   Using vics/ nyquil and tylenol sinus   No flu contacts known but she travels   Patient Active Problem List   Diagnosis Date Noted  . Encounter for Medicare annual wellness exam 09/24/2013  . Enlarged lymph node 03/07/2013  . Sarcoid 08/09/2012  . Mixed incontinence urge and stress 03/16/2012  . ALOPECIA 11/14/2010  . GERD 01/24/2009  . NEOPLASM, MALIGNANT, BREAST, HX OF 01/02/2009  . HYPERLIPIDEMIA 11/17/2007  . VAGINITIS, ATROPHIC, SYMPTOMATIC 11/17/2007  . OSTEOPENIA 11/17/2007  . TINNITUS 09/07/2007  . ALLERGIC RHINITIS 09/07/2007  . DIVERTICULOSIS, COLON 09/07/2007  . FATTY LIVER DISEASE 09/07/2007  . DIVERTICULITIS, HX OF 08/12/2007   Past Medical History  Diagnosis Date  . Allergic rhinitis   . Diverticulosis   . History of shingles   . HLD (hyperlipidemia)   . Atrophic vaginitis   . Osteopenia   . Sliding hiatal hernia   . GERD (gastroesophageal reflux disease)   . Arthritis   . Breast cancer     Right mastectomy  . IBS (irritable bowel syndrome)   . H/O: pneumonia    Past Surgical History  Procedure Laterality Date  . Dilation and curettage of uterus  10/1999  . Laparoscopic total hysterectomy  10/03    for fibroids/polyp  . Appendectomy    . Breast lumpectomy    . Mastectomy  7/10    right  . Colonoscopy  02/2010    divertics, rpt 5 years  . Dexa  2005    osteopenia, no change from 2003  . Total abdominal hysterectomy  2003    for fibroids/polyps  . Basal cell carcinoma excision      scalp   History  Substance Use Topics  . Smoking status: Former Smoker    Quit date: 08/31/1968  . Smokeless tobacco: Never Used  . Alcohol Use: Yes   Comment: 1-2 glasses of wine/weekly   Family History  Problem Relation Age of Onset  . Prostate cancer Father   . Stomach cancer Paternal Grandfather   . Heart attack Paternal Grandmother   . Prostate cancer Brother   . Lymphoma Brother    Allergies  Allergen Reactions  . Meloxicam     REACTION: rash  . Niacin And Related Other (See Comments)    Caused black and blue marks all over body   Current Outpatient Prescriptions on File Prior to Visit  Medication Sig Dispense Refill  . calcium-vitamin D (OSCAL WITH D) 500-200 MG-UNIT per tablet Take 1 tablet by mouth daily.      . clobetasol (TEMOVATE) 0.05 % external solution Use as directed      . conjugated estrogens (PREMARIN) vaginal cream INSERT A SMALL AMOUNT USING APPLICATOR  INTRAVAGINALLY 2 TIMES PER WEEK AS      DIRECTED  127.5 g  5  . cyclobenzaprine (FLEXERIL) 10 MG tablet Take 1 tablet (10 mg total) by mouth 3 (three) times daily as needed.  30 tablet  5  . fish oil-omega-3 fatty acids 1000 MG capsule Take 1 g by mouth daily.       . methocarbamol (ROBAXIN) 500 MG tablet  Take 500 mg by mouth as needed for muscle spasms.      . Multiple Vitamins-Minerals (CENTRUM SILVER ULTRA WOMENS PO) Take by mouth.      . naproxen (NAPROSYN) 500 MG tablet TAKE ONE TABLET BY MOUTH TWICE DAILY    AS NEEDED WITH FOOD  30 tablet  5  . omeprazole (PRILOSEC) 20 MG capsule TAKE 1 CAPSULE BY MOUTH EVERY DAY AS NEEDED.  30 capsule  11  . TAMOXIFEN CITRATE PO Take 1 tablet by mouth daily.         No current facility-administered medications on file prior to visit.      Review of Systems Review of Systems  Constitutional: pos for fever and malaise and fatigue ENT pos for congestion and rhinorrhea and facial pain   Eyes: Negative for pain and visual disturbance.  Respiratory: Negative for wheeze  and shortness of breath.  pos for cough Cardiovascular: Negative for cp or palpitations    Gastrointestinal: Negative for nausea, diarrhea and  constipation.  Genitourinary: Negative for urgency and frequency.  Skin: Negative for pallor or rash   Neurological: Negative for weakness, light-headedness, numbness and headaches.  Hematological: Negative for adenopathy. Does not bruise/bleed easily.  Psychiatric/Behavioral: Negative for dysphoric mood. The patient is not nervous/anxious.         Objective:   Physical Exam  Constitutional: She appears well-developed and well-nourished. No distress.  HENT:  Head: Normocephalic and atraumatic.  Right Ear: External ear normal.  Left Ear: External ear normal.  Mouth/Throat: Oropharynx is clear and moist. No oropharyngeal exudate.  Nares are injected and congested   Mild maxillary sinus tenderness bilat  Throat clear   Eyes: Conjunctivae and EOM are normal. Pupils are equal, round, and reactive to light. No scleral icterus.  Neck: Normal range of motion. Neck supple.  Cardiovascular: Regular rhythm.   Pulmonary/Chest: Effort normal and breath sounds normal. No respiratory distress. She has no wheezes. She has no rales.  Harsh bs with occ rhonchi noted  Barky cough No wheeze   Lymphadenopathy:    She has no cervical adenopathy.  Neurological: She is alert.  Skin: Skin is warm and dry. No rash noted. No erythema.  Psychiatric: She has a normal mood and affect.          Assessment & Plan:

## 2013-11-22 NOTE — Progress Notes (Signed)
Pre visit review using our clinic review tool, if applicable. No additional management support is needed unless otherwise documented below in the visit note. 

## 2014-02-22 ENCOUNTER — Ambulatory Visit (INDEPENDENT_AMBULATORY_CARE_PROVIDER_SITE_OTHER): Payer: Medicare Other | Admitting: Internal Medicine

## 2014-02-22 ENCOUNTER — Encounter: Payer: Self-pay | Admitting: Internal Medicine

## 2014-02-22 VITALS — BP 104/58 | HR 110 | Temp 99.3°F | Wt 147.0 lb

## 2014-02-22 DIAGNOSIS — L989 Disorder of the skin and subcutaneous tissue, unspecified: Secondary | ICD-10-CM

## 2014-02-22 DIAGNOSIS — J069 Acute upper respiratory infection, unspecified: Secondary | ICD-10-CM

## 2014-02-22 DIAGNOSIS — R51 Headache: Secondary | ICD-10-CM

## 2014-02-22 DIAGNOSIS — R519 Headache, unspecified: Secondary | ICD-10-CM

## 2014-02-22 MED ORDER — SULFAMETHOXAZOLE-TMP DS 800-160 MG PO TABS: 1.0000 | ORAL_TABLET | Freq: Two times a day (BID) | ORAL | Status: DC

## 2014-02-22 NOTE — Progress Notes (Signed)
Pre visit review using our clinic review tool, if applicable. No additional management support is needed unless otherwise documented below in the visit note. 

## 2014-02-22 NOTE — Progress Notes (Signed)
Subjective:    Patient ID: Judith [name], female    DOB: 05/15/1946, 68 y.o.   MRN: [id number]  HPI  Pt presents to the clinic today with multiple complaints.   1- She reports that her scalp and the back of her head are very tender. This started 1 week ago. She reports she had a abnormal mole removed from her scalp 6 months ago and not sure if this is related.   2- She reports fever, sore throat and headache. She reports this started yesterday. She denies runny nose of cough. She has had chills and body aches. She does have a history of allergies but denies breathing problems. She has tried cough drops and chloraseptic spray without much relief.   Review of Systems      Past Medical History  Diagnosis Date  . Allergic rhinitis   . Diverticulosis   . History of shingles   . HLD (hyperlipidemia)   . Atrophic vaginitis   . Osteopenia   . Sliding hiatal hernia   . GERD (gastroesophageal reflux disease)   . Arthritis   . Breast cancer     Right mastectomy  . IBS (irritable bowel syndrome)   . H/O: pneumonia     Current Outpatient Prescriptions  Medication Sig Dispense Refill  . calcium-vitamin D (OSCAL WITH D) 500-200 MG-UNIT per tablet Take 1 tablet by mouth daily.      . clobetasol (TEMOVATE) 0.05 % external solution Use as directed      . conjugated estrogens (PREMARIN) vaginal cream INSERT A SMALL AMOUNT USING APPLICATOR  INTRAVAGINALLY 2 TIMES PER WEEK AS      DIRECTED  127.5 g  5  . cyclobenzaprine (FLEXERIL) 10 MG tablet Take 1 tablet (10 mg total) by mouth 3 (three) times daily as needed.  30 tablet  5  . fish oil-omega-3 fatty acids 1000 MG capsule Take 1 g by mouth daily.       . methocarbamol (ROBAXIN) 500 MG tablet Take 500 mg by mouth as needed for muscle spasms.      . Multiple Vitamins-Minerals (CENTRUM SILVER ULTRA WOMENS PO) Take by mouth.      . naproxen (NAPROSYN) 500 MG tablet TAKE ONE TABLET BY MOUTH TWICE DAILY    AS NEEDED WITH FOOD  30 tablet  5    . omeprazole (PRILOSEC) 20 MG capsule TAKE 1 CAPSULE BY MOUTH EVERY DAY AS NEEDED.  30 capsule  11  . TAMOXIFEN CITRATE PO Take 1 tablet by mouth daily.         No current facility-administered medications for this visit.    Allergies  Allergen Reactions  . Meloxicam     REACTION: rash  . Niacin And Related Other (See Comments)    Caused black and blue marks all over body    Family History  Problem Relation Age of Onset  . Prostate cancer Father   . Stomach cancer Paternal Grandfather   . Heart attack Paternal Grandmother   . Prostate cancer Brother   . Lymphoma Brother     History   Social History  . Marital Status: Married    Spouse Name: N/A    Number of Children: 2  . Years of Education: N/A   Occupational History  . Retired    Social History Main Topics  . Smoking status: Former Smoker    Quit date: 08/31/1968  . Smokeless tobacco: Never Used  . Alcohol Use: Yes     Comment: 1-2 glasses  of wine/weekly  . Drug Use: No  . Sexual Activity: Not on file   Other Topics Concern  . Not on file   Social History Narrative   Married      2 sons      Retired      2 cups coffee/daily      Gym 3-4 times/week     Constitutional: Pt reports headache. Denies fever, malaise, fatigue, or abrupt weight changes.  HEENT: Pt reports scalp tenderness and sore throat. Denies eye pain, eye redness, ear pain, ringing in the ears, wax buildup, runny nose, nasal congestion, bloody nose, or sore throat. Respiratory: Denies difficulty breathing, shortness of breath, cough or sputum production.   Skin: Denies redness, rashes, lesions or ulcercations.   No other specific complaints in a complete review of systems (except as listed in HPI above).  Objective:   Physical Exam  BP 104/58  Pulse 110  Temp(Src) 99.3 F (37.4 C) (Oral)  Wt 147 lb (66.679 kg)  SpO2 96% Wt Readings from Last 3 Encounters:  02/22/14 147 lb (66.679 kg)  11/22/13 148 lb 4 oz (67.246 kg)   10/02/13 148 lb (67.132 kg)    General: Appears her stated age, well developed, well nourished in NAD. Skin: Warm, dry and intact. Quarter size area of redness noted on left posterior scalp, tender to touch. Flaking of the skin also noted in the area. HEENT: Head: normal shape and size; Eyes: sclera white, no icterus, conjunctiva pink, PERRLA and EOMs intact; Ears: Tm's gray and intact, normal light reflex; Nose: mucosa pink and moist, septum midline; Throat/Mouth: Teeth present, mucosa erythematous and moist, no exudate, lesions or ulcerations noted.  Cardiovascular: Normal rate and rhythm. S1,S2 noted.  No murmur, rubs or gallops noted. No JVD or BLE edema. No carotid bruits noted. Pulmonary/Chest: Normal effort and positive vesicular breath sounds. No respiratory distress. No wheezes, rales or ronchi noted.     BMET    Component Value Date/Time   NA 141 09/25/2013 0854   K 4.3 09/25/2013 0854   CL 107 09/25/2013 0854   CO2 27 09/25/2013 0854   GLUCOSE 93 09/25/2013 0854   BUN 18 09/25/2013 0854   CREATININE 0.9 09/25/2013 0854   CALCIUM 8.9 09/25/2013 0854   GFRNONAA 76.55 12/30/2009 1054   GFRAA 93 11/17/2007 1013    Lipid Panel     Component Value Date/Time   CHOL 179 09/25/2013 0854   TRIG 112.0 09/25/2013 0854   HDL 52.50 09/25/2013 0854   CHOLHDL 3 09/25/2013 0854   VLDL 22.4 09/25/2013 0854   LDLCALC 104* 09/25/2013 0854    CBC    Component Value Date/Time   WBC 6.7 09/25/2013 0854   RBC 4.44 09/25/2013 0854   HGB 13.7 09/25/2013 0854   HCT 40.8 09/25/2013 0854   PLT 276.0 09/25/2013 0854   MCV 91.8 09/25/2013 0854   MCHC 33.5 09/25/2013 0854   RDW 13.8 09/25/2013 0854   LYMPHSABS 1.9 09/25/2013 0854   MONOABS 0.5 09/25/2013 0854   EOSABS 0.3 09/25/2013 0854   BASOSABS 0.0 09/25/2013 0854    Hgb A1C No results found for this basename: HGBA1C         Assessment & Plan:   Left posterior scalp lesion:  ? Abscess vs return of basal cell Will try septra BID x 5 days  If no  improvement, I want you to go see your dermatologist ASAP  Viral URI:  Supportive care for now Ibuprofen for fever, pain, inflammation  Salt water gargles for the throat Delsym for cough  RTC as needed or if symptoms persist or worsen

## 2014-02-22 NOTE — Patient Instructions (Addendum)
Upper Respiratory Infection, Adult An upper respiratory infection (URI) is also sometimes known as the common cold. The upper respiratory tract includes the nose, sinuses, throat, trachea, and bronchi. Bronchi are the airways leading to the lungs. Most people improve within 1 week, but symptoms can last up to 2 weeks. A residual cough may last even longer.  CAUSES Many different viruses can infect the tissues lining the upper respiratory tract. The tissues become irritated and inflamed and often become very moist. Mucus production is also common. A cold is contagious. You can easily spread the virus to others by oral contact. This includes kissing, sharing a glass, coughing, or sneezing. Touching your mouth or nose and then touching a surface, which is then touched by another person, can also spread the virus. SYMPTOMS  Symptoms typically develop 1 to 3 days after you come in contact with a cold virus. Symptoms vary from person to person. They may include:  Runny nose.  Sneezing.  Nasal congestion.  Sinus irritation.  Sore throat.  Loss of voice (laryngitis).  Cough.  Fatigue.  Muscle aches.  Loss of appetite.  Headache.  Low-grade fever. DIAGNOSIS  You might diagnose your own cold based on familiar symptoms, since most people get a cold 2 to 3 times a year. Your caregiver can confirm this based on your exam. Most importantly, your caregiver can check that your symptoms are not due to another disease such as strep throat, sinusitis, pneumonia, asthma, or epiglottitis. Blood tests, throat tests, and X-rays are not necessary to diagnose a common cold, but they may sometimes be helpful in excluding other more serious diseases. Your caregiver will decide if any further tests are required. RISKS AND COMPLICATIONS  You may be at risk for a more severe case of the common cold if you smoke cigarettes, have chronic heart disease (such as heart failure) or lung disease (such as asthma), or if  you have a weakened immune system. The very young and very old are also at risk for more serious infections. Bacterial sinusitis, middle ear infections, and bacterial pneumonia can complicate the common cold. The common cold can worsen asthma and chronic obstructive pulmonary disease (COPD). Sometimes, these complications can require emergency medical care and may be life-threatening. PREVENTION  The best way to protect against getting a cold is to practice good hygiene. Avoid oral or hand contact with people with cold symptoms. Wash your hands often if contact occurs. There is no clear evidence that vitamin C, vitamin E, echinacea, or exercise reduces the chance of developing a cold. However, it is always recommended to get plenty of rest and practice good nutrition. TREATMENT  Treatment is directed at relieving symptoms. There is no cure. Antibiotics are not effective, because the infection is caused by a virus, not by bacteria. Treatment may include:  Increased fluid intake. Sports drinks offer valuable electrolytes, sugars, and fluids.  Breathing heated mist or steam (vaporizer or shower).  Eating chicken soup or other clear broths, and maintaining good nutrition.  Getting plenty of rest.  Using gargles or lozenges for comfort.  Controlling fevers with ibuprofen or acetaminophen as directed by your caregiver.  Increasing usage of your inhaler if you have asthma. Zinc gel and zinc lozenges, taken in the first 24 hours of the common cold, can shorten the duration and lessen the severity of symptoms. Pain medicines may help with fever, muscle aches, and throat pain. A variety of non-prescription medicines are available to treat congestion and runny nose. Your caregiver  can make recommendations and may suggest nasal or lung inhalers for other symptoms.  HOME CARE INSTRUCTIONS   Only take over-the-counter or prescription medicines for pain, discomfort, or fever as directed by your  caregiver.  Use a warm mist humidifier or inhale steam from a shower to increase air moisture. This may keep secretions moist and make it easier to breathe.  Drink enough water and fluids to keep your urine clear or pale yellow.  Rest as needed.  Return to work when your temperature has returned to normal or as your caregiver advises. You may need to stay home longer to avoid infecting others. You can also use a face mask and careful hand washing to prevent spread of the virus. SEEK MEDICAL CARE IF:   After the first few days, you feel you are getting worse rather than better.  You need your caregiver's advice about medicines to control symptoms.  You develop chills, worsening shortness of breath, or brown or red sputum. These may be signs of pneumonia.  You develop yellow or brown nasal discharge or pain in the face, especially when you bend forward. These may be signs of sinusitis.  You develop a fever, swollen neck glands, pain with swallowing, or white areas in the back of your throat. These may be signs of strep throat. SEEK IMMEDIATE MEDICAL CARE IF:   You have a fever.  You develop severe or persistent headache, ear pain, sinus pain, or chest pain.  You develop wheezing, a prolonged cough, cough up blood, or have a change in your usual mucus (if you have chronic lung disease).  You develop sore muscles or a stiff neck. Document Released: 02/10/2001 Document Revised: 11/09/2011 Document Reviewed: 12/19/2010 [facility] Patient Information 2015 [place], [place]. This information is not intended to replace advice given to you by your health care provider. Make sure you discuss any questions you have with your health care provider.

## 2014-02-28 LAB — HM MAMMOGRAPHY

## 2014-03-22 ENCOUNTER — Ambulatory Visit: Payer: Self-pay | Admitting: Oncology

## 2014-03-26 ENCOUNTER — Ambulatory Visit: Payer: Self-pay | Admitting: Oncology

## 2014-06-21 ENCOUNTER — Ambulatory Visit (INDEPENDENT_AMBULATORY_CARE_PROVIDER_SITE_OTHER): Payer: Medicare Other

## 2014-06-21 DIAGNOSIS — Z23 Encounter for immunization: Secondary | ICD-10-CM

## 2014-09-26 ENCOUNTER — Encounter: Payer: Self-pay | Admitting: Family Medicine

## 2014-09-26 ENCOUNTER — Other Ambulatory Visit: Payer: Medicare Other

## 2014-09-26 ENCOUNTER — Ambulatory Visit (INDEPENDENT_AMBULATORY_CARE_PROVIDER_SITE_OTHER)
Admission: RE | Admit: 2014-09-26 | Discharge: 2014-09-26 | Disposition: A | Discharge status: Home / Self Care | Payer: Medicare HMO | Source: Ambulatory Visit | Attending: Family Medicine | Admitting: Family Medicine

## 2014-09-26 ENCOUNTER — Ambulatory Visit (INDEPENDENT_AMBULATORY_CARE_PROVIDER_SITE_OTHER): Payer: Medicare HMO | Admitting: Family Medicine

## 2014-09-26 VITALS — BP 118/64 | HR 98 | Temp 98.0°F | Ht 61.0 in | Wt 152.4 lb

## 2014-09-26 DIAGNOSIS — R1030 Lower abdominal pain, unspecified: Secondary | ICD-10-CM

## 2014-09-26 DIAGNOSIS — M545 Low back pain, unspecified: Secondary | ICD-10-CM | POA: Insufficient documentation

## 2014-09-26 LAB — CBC WITH DIFFERENTIAL/PLATELET
BASOS ABS: 0 10*3/uL (ref 0.0–0.1)
Basophils Relative: 0.6 % (ref 0.0–3.0)
EOS PCT: 3.9 % (ref 0.0–5.0)
Eosinophils Absolute: 0.3 10*3/uL (ref 0.0–0.7)
HCT: 41.3 % (ref 36.0–46.0)
HEMOGLOBIN: 14 g/dL (ref 12.0–15.0)
LYMPHS ABS: 1.6 10*3/uL (ref 0.7–4.0)
LYMPHS PCT: 24.9 % (ref 12.0–46.0)
MCHC: 34 g/dL (ref 30.0–36.0)
MCV: 90 fl (ref 78.0–100.0)
MONO ABS: 0.6 10*3/uL (ref 0.1–1.0)
Monocytes Relative: 8.7 % (ref 3.0–12.0)
NEUTROS PCT: 61.9 % (ref 43.0–77.0)
Neutro Abs: 4.1 10*3/uL (ref 1.4–7.7)
Platelets: 319 10*3/uL (ref 150.0–400.0)
RBC: 4.59 Mil/uL (ref 3.87–5.11)
RDW: 13.7 % (ref 11.5–15.5)
WBC: 6.6 10*3/uL (ref 4.0–10.5)

## 2014-09-26 NOTE — Patient Instructions (Signed)
Xray of abdomen today Lab for cbc today  Take miralax 1-2 times daily to get bowels moving well  Drink lots of water and eat fiber

## 2014-09-26 NOTE — Progress Notes (Signed)
Pre visit review using our clinic review tool, if applicable. No additional management support is needed unless otherwise documented below in the visit note. 

## 2014-09-26 NOTE — Progress Notes (Signed)
Subjective:    Patient ID: Barbara Cower, female    DOB: December 19, 1945, 69 y.o.   MRN: [id number]  HPI Here with abd and low back pain    Her back "went out" last week- took naproxen and flexeril (upper back)    Now having low abdomen pain and low back pain (low back pain is unusual for her)- does not feel muscular  Is constipated - bm yesterday-not normal for her - still having bm every day- just harder to pass  (usually bowels are loose)  No fever  No tenderness  Very bloated  Scant blood in stool from hemorrhoids No n/v   Has been eating regularly    Last colonosc 2011 - diverticulosis  Patient Active Problem List   Diagnosis Date Noted  . Acute bronchitis 11/22/2013  . Encounter for Medicare annual wellness exam 09/24/2013  . Enlarged lymph node 03/07/2013  . Sarcoid 08/09/2012  . Mixed incontinence urge and stress 03/16/2012  . ALOPECIA 11/14/2010  . GERD 01/24/2009  . NEOPLASM, MALIGNANT, BREAST, HX OF 01/02/2009  . HYPERLIPIDEMIA 11/17/2007  . VAGINITIS, ATROPHIC, SYMPTOMATIC 11/17/2007  . OSTEOPENIA 11/17/2007  . TINNITUS 09/07/2007  . ALLERGIC RHINITIS 09/07/2007  . DIVERTICULOSIS, COLON 09/07/2007  . FATTY LIVER DISEASE 09/07/2007  . DIVERTICULITIS, HX OF 08/12/2007   Past Medical History  Diagnosis Date  . Allergic rhinitis   . Diverticulosis   . History of shingles   . HLD (hyperlipidemia)   . Atrophic vaginitis   . Osteopenia   . Sliding hiatal hernia   . GERD (gastroesophageal reflux disease)   . Arthritis   . Breast cancer     Right mastectomy  . IBS (irritable bowel syndrome)   . H/O: pneumonia    Past Surgical History  Procedure Laterality Date  . Dilation and curettage of uterus  10/1999  . Laparoscopic total hysterectomy  10/03    for fibroids/polyp  . Appendectomy    . Breast lumpectomy    . Mastectomy  7/10    right  . Colonoscopy  02/2010    divertics, rpt 5 years  . Dexa  2005    osteopenia, no change from 2003  .  Total abdominal hysterectomy  2003    for fibroids/polyps  . Basal cell carcinoma excision      scalp   History  Substance Use Topics  . Smoking status: Former Smoker    Quit date: 08/31/1968  . Smokeless tobacco: Never Used  . Alcohol Use: Yes     Comment: 1-2 glasses of wine/weekly   Family History  Problem Relation Age of Onset  . Prostate cancer Father   . Stomach cancer Paternal Grandfather   . Heart attack Paternal Grandmother   . Prostate cancer Brother   . Lymphoma Brother    Allergies  Allergen Reactions  . Meloxicam     REACTION: rash  . Niacin And Related Other (See Comments)    Caused black and blue marks all over body   Current Outpatient Prescriptions on File Prior to Visit  Medication Sig Dispense Refill  . calcium-vitamin D (OSCAL WITH D) 500-200 MG-UNIT per tablet Take 1 tablet by mouth daily.    . clobetasol (TEMOVATE) 0.05 % external solution Use as directed    . conjugated estrogens (PREMARIN) vaginal cream INSERT A SMALL AMOUNT USING APPLICATOR  INTRAVAGINALLY 2 TIMES PER WEEK AS      DIRECTED 127.5 g 5  . cyclobenzaprine (FLEXERIL) 10 MG tablet Take 1 tablet (10  mg total) by mouth 3 (three) times daily as needed. 30 tablet 5  . fish oil-omega-3 fatty acids 1000 MG capsule Take 1 g by mouth daily.     . methocarbamol (ROBAXIN) 500 MG tablet Take 500 mg by mouth as needed for muscle spasms.    . Multiple Vitamins-Minerals (CENTRUM SILVER ULTRA WOMENS PO) Take by mouth.    . naproxen (NAPROSYN) 500 MG tablet TAKE ONE TABLET BY MOUTH TWICE DAILY    AS NEEDED WITH FOOD 30 tablet 5  . omeprazole (PRILOSEC) 20 MG capsule TAKE 1 CAPSULE BY MOUTH EVERY DAY AS NEEDED. 30 capsule 11  . sulfamethoxazole-trimethoprim (BACTRIM DS) 800-160 MG per tablet Take 1 tablet by mouth 2 (two) times daily. 10 tablet 0   No current facility-administered medications on file prior to visit.     Review of Systems .Review of Systems  Constitutional: Negative for fever, appetite  change, fatigue and unexpected weight change.  Eyes: Negative for pain and visual disturbance.  Respiratory: Negative for cough and shortness of breath.   Cardiovascular: Negative for cp or palpitations    Gastrointestinal: Negative for nausea, diarrhea and constipation. neg for dark stool or rectal pain  Genitourinary: Negative for urgency and frequency.  Skin: Negative for pallor or rash MSK pos for low back pain and mid back pain that is improved    Neurological: Negative for weakness, light-headedness, numbness and headaches.  Hematological: Negative for adenopathy. Does not bruise/bleed easily.  Psychiatric/Behavioral: Negative for dysphoric mood. The patient is not nervous/anxious.         Objective:   Physical Exam  Constitutional: She is oriented to person, place, and time. She appears well-developed and well-nourished. No distress.  overwt and well app  HENT:  Head: Normocephalic and atraumatic.  Mouth/Throat: Oropharynx is clear and moist.  Eyes: Conjunctivae and EOM are normal. Pupils are equal, round, and reactive to light. No scleral icterus.  Neck: Normal range of motion. Neck supple.  Cardiovascular: Normal rate, regular rhythm and normal heart sounds.   Pulmonary/Chest: Effort normal and breath sounds normal. No respiratory distress. She has no wheezes. She has no rales.  Abdominal: Soft. Bowel sounds are normal. She exhibits no distension, no abdominal bruit and no mass. There is no hepatosplenomegaly. There is tenderness in the right lower quadrant and left lower quadrant. There is no rigidity, no rebound, no guarding, no CVA tenderness, no tenderness at McBurney's point and negative Murphy's sign.  Musculoskeletal: She exhibits no edema.       Lumbar back: She exhibits tenderness and bony tenderness. She exhibits normal range of motion and no edema.  Mild tenderness over L4-L5 Nl rom Neg SLR  Lymphadenopathy:    She has no cervical adenopathy.  Neurological: She is  alert and oriented to person, place, and time. She has normal reflexes. No cranial nerve deficit. She exhibits normal muscle tone. Coordination normal.  Skin: Skin is warm and dry. No rash noted. No erythema. No pallor.  Psychiatric: She has a normal mood and affect.          Assessment & Plan:   Problem List Items Addressed This Visit      Other   Low back pain    This may be related to constipation / abd bloating or deg change in spine Not positional at this time  She is taking nsaid Disc use of heat abd xray today and update      Lower abdominal pain - Primary    Suspect  this may be from constipation after steady nsaid use  Xray abd today Also labs  Disc fiber/fluid intake inst to use miralax bid for the next 3-4 d  Will update with result  Will call if worse       Relevant Orders   DG Abd 2 Views (Completed)   CBC with Differential/Platelet (Completed)

## 2014-09-27 NOTE — Assessment & Plan Note (Signed)
This may be related to constipation / abd bloating or deg change in spine Not positional at this time  She is taking nsaid Disc use of heat abd xray today and update

## 2014-09-27 NOTE — Assessment & Plan Note (Signed)
Suspect this may be from constipation after steady nsaid use  Xray abd today Also labs  Disc fiber/fluid intake inst to use miralax bid for the next 3-4 d  Will update with result  Will call if worse

## 2014-10-03 ENCOUNTER — Encounter: Payer: Medicare Other | Admitting: Family Medicine

## 2014-10-19 ENCOUNTER — Other Ambulatory Visit: Payer: Self-pay | Admitting: Family Medicine

## 2014-11-26 ENCOUNTER — Telehealth: Payer: Self-pay | Admitting: Family Medicine

## 2014-11-26 DIAGNOSIS — K760 Fatty (change of) liver, not elsewhere classified: Secondary | ICD-10-CM

## 2014-11-26 DIAGNOSIS — E785 Hyperlipidemia, unspecified: Secondary | ICD-10-CM

## 2014-11-26 DIAGNOSIS — M8580 Other specified disorders of bone density and structure, unspecified site: Secondary | ICD-10-CM

## 2014-11-26 NOTE — Telephone Encounter (Signed)
-----   Message from Ellamae Sia sent at 11/26/2014  2:56 PM EDT ----- Regarding: Lab orders for Tuesday, 3.29.16 Patient is scheduled for CPX labs, please order future labs, Thanks , Karna Christmas

## 2014-11-28 ENCOUNTER — Other Ambulatory Visit (INDEPENDENT_AMBULATORY_CARE_PROVIDER_SITE_OTHER): Payer: Medicare HMO

## 2014-11-28 DIAGNOSIS — E785 Hyperlipidemia, unspecified: Secondary | ICD-10-CM | POA: Diagnosis not present

## 2014-11-28 DIAGNOSIS — K760 Fatty (change of) liver, not elsewhere classified: Secondary | ICD-10-CM | POA: Diagnosis not present

## 2014-11-28 DIAGNOSIS — M8580 Other specified disorders of bone density and structure, unspecified site: Secondary | ICD-10-CM | POA: Diagnosis not present

## 2014-11-28 LAB — CBC WITH DIFFERENTIAL/PLATELET
BASOS ABS: 0 10*3/uL (ref 0.0–0.1)
Basophils Relative: 0.8 % (ref 0.0–3.0)
EOS ABS: 0.3 10*3/uL (ref 0.0–0.7)
Eosinophils Relative: 4.6 % (ref 0.0–5.0)
HCT: 41.7 % (ref 36.0–46.0)
Hemoglobin: 14.1 g/dL (ref 12.0–15.0)
LYMPHS PCT: 28.8 % (ref 12.0–46.0)
Lymphs Abs: 1.8 10*3/uL (ref 0.7–4.0)
MCHC: 33.9 g/dL (ref 30.0–36.0)
MCV: 91 fl (ref 78.0–100.0)
MONOS PCT: 6.5 % (ref 3.0–12.0)
Monocytes Absolute: 0.4 10*3/uL (ref 0.1–1.0)
NEUTROS ABS: 3.8 10*3/uL (ref 1.4–7.7)
NEUTROS PCT: 59.3 % (ref 43.0–77.0)
PLATELETS: 300 10*3/uL (ref 150.0–400.0)
RBC: 4.58 Mil/uL (ref 3.87–5.11)
RDW: 13.7 % (ref 11.5–15.5)
WBC: 6.3 10*3/uL (ref 4.0–10.5)

## 2014-11-28 LAB — LIPID PANEL
CHOLESTEROL: 169 mg/dL (ref 0–200)
HDL: 48.1 mg/dL (ref >39.00)
LDL Cholesterol: 97 mg/dL (ref 0–99)
NONHDL: 120.9
TRIGLYCERIDES: 119 mg/dL (ref 0.0–149.0)
Total CHOL/HDL Ratio: 4
VLDL: 23.8 mg/dL (ref 0.0–40.0)

## 2014-11-28 LAB — COMPREHENSIVE METABOLIC PANEL
ALT: 15 U/L (ref 0–35)
AST: 16 U/L (ref 0–37)
Albumin: 3.8 g/dL (ref 3.5–5.2)
Alkaline Phosphatase: 60 U/L (ref 39–117)
BILIRUBIN TOTAL: 0.3 mg/dL (ref 0.2–1.2)
BUN: 13 mg/dL (ref 6–23)
CHLORIDE: 106 meq/L (ref 96–112)
CO2: 30 meq/L (ref 19–32)
Calcium: 9.3 mg/dL (ref 8.4–10.5)
Creatinine, Ser: 0.83 mg/dL (ref 0.40–1.20)
GFR: 72.28 mL/min (ref >60.00)
Glucose, Bld: 83 mg/dL (ref 70–99)
POTASSIUM: 4.2 meq/L (ref 3.5–5.1)
Sodium: 140 mEq/L (ref 135–145)
Total Protein: 6.7 g/dL (ref 6.0–8.3)

## 2014-11-28 LAB — VITAMIN D 25 HYDROXY (VIT D DEFICIENCY, FRACTURES): VITD: 34.99 ng/mL (ref 30.00–100.00)

## 2014-11-28 LAB — TSH: TSH: 3.76 u[IU]/mL (ref 0.35–4.50)

## 2014-12-05 ENCOUNTER — Ambulatory Visit (INDEPENDENT_AMBULATORY_CARE_PROVIDER_SITE_OTHER): Payer: Medicare HMO | Admitting: Family Medicine

## 2014-12-05 ENCOUNTER — Encounter: Payer: Self-pay | Admitting: Family Medicine

## 2014-12-05 VITALS — BP 104/64 | HR 88 | Temp 98.6°F | Resp 18 | Ht 61.0 in | Wt 151.8 lb

## 2014-12-05 DIAGNOSIS — Z0000 Encounter for general adult medical examination without abnormal findings: Secondary | ICD-10-CM

## 2014-12-05 DIAGNOSIS — M8580 Other specified disorders of bone density and structure, unspecified site: Secondary | ICD-10-CM

## 2014-12-05 DIAGNOSIS — Z23 Encounter for immunization: Secondary | ICD-10-CM

## 2014-12-05 DIAGNOSIS — E785 Hyperlipidemia, unspecified: Secondary | ICD-10-CM | POA: Diagnosis not present

## 2014-12-05 DIAGNOSIS — E2839 Other primary ovarian failure: Secondary | ICD-10-CM

## 2014-12-05 DIAGNOSIS — Z8719 Personal history of other diseases of the digestive system: Secondary | ICD-10-CM | POA: Diagnosis not present

## 2014-12-05 MED ORDER — OMEPRAZOLE 20 MG PO CPDR: DELAYED_RELEASE_CAPSULE | ORAL | Status: DC

## 2014-12-05 MED ORDER — METHOCARBAMOL 500 MG PO TABS: 500.0000 mg | ORAL_TABLET | Freq: Three times a day (TID) | ORAL | Status: DC | PRN

## 2014-12-05 MED ORDER — CIPROFLOXACIN HCL 500 MG PO TABS: 500.0000 mg | ORAL_TABLET | Freq: Two times a day (BID) | ORAL | Status: DC

## 2014-12-05 MED ORDER — METRONIDAZOLE 500 MG PO TABS: 500.0000 mg | ORAL_TABLET | Freq: Three times a day (TID) | ORAL | Status: DC

## 2014-12-05 NOTE — Patient Instructions (Signed)
prevnar vaccine today  Stop at check out for referral for bone density test  Get back on daily vitamin D   Try to keep up some regular exercise

## 2014-12-05 NOTE — Progress Notes (Signed)
Subjective:    Patient ID: Judith [name], female    DOB: August 05, 1946, 69 y.o.   MRN: [id number]  HPI Here for annual medicare wellness visit as well as chronic/acute medical problems    I have personally reviewed the Medicare Annual Wellness questionnaire and have noted 1. The patient's medical and social history 2. Their use of alcohol, tobacco or illicit drugs 3. Their current medications and supplements 4. The patient's functional ability including ADL's, fall risks, home safety risks and hearing or visual             impairment. 5. Diet and physical activities 6. Evidence for depression or mood disorders  The patients weight, height, BMI have been recorded in the chart and visual acuity is per eye clinic.  I have made referrals, counseling and provided education to the patient based review of the above and I have provided the pt with a written personalized care plan for preventive services.  Doing well  She is going to [place] in May  She needs cipro/flagyl in case she gets diverticulitis  Her ins no longer covers flexeril - will try robaxin   See scanned forms.  Routine anticipatory guidance given to patient.  See health maintenance. Declines Hep C screening  Colon cancer screening 7/11 colonoscopy Breast cancer screening 6/15 (L mastectomy in the past) -finally off tamoxifen  Self breast exam -no lumps  Flu vaccine 10/15  Tetanus vaccine  7/12  Pneumovax 7/12  Zoster vaccine - did not get due to plaquenil  dexa 2012 - low normal range , no fractures  Advance directive has living will and POA  Cognitive function addressed- see scanned forms- and if abnormal then additional documentation follows. - thinks it is ok -no concerns   PMH and SH reviewed  Meds, vitals, and allergies reviewed.   ROS: See HPI.  Otherwise negative.    Hx of hyperlipidemia Lab Results  Component Value Date   CHOL 169 11/28/2014   CHOL 179 09/25/2013   CHOL 186 03/10/2012   Lab Results   Component Value Date   HDL 48.10 11/28/2014   HDL 52.50 09/25/2013   HDL 47.90 03/10/2012   Lab Results  Component Value Date   LDLCALC 97 11/28/2014   LDLCALC 104* 09/25/2013   LDLCALC 111* 03/10/2012   Lab Results  Component Value Date   TRIG 119.0 11/28/2014   TRIG 112.0 09/25/2013   TRIG 134.0 03/10/2012   Lab Results  Component Value Date   CHOLHDL 4 11/28/2014   CHOLHDL 3 09/25/2013   CHOLHDL 4 03/10/2012   Lab Results  Component Value Date   LDLDIRECT 154.0 03/13/2011   LDLDIRECT 134.4 12/18/2008   LDLDIRECT 148.2 11/17/2007     cholesterol is stable and fairly controlled with healthy diet   Has not been exercising 2 mo - had moh's surgery for basal cell on the nose  and some other things- [facility]     Chemistry      Component Value Date/Time   NA 140 11/28/2014 0818   K 4.2 11/28/2014 0818   CL 106 11/28/2014 0818   CO2 30 11/28/2014 0818   BUN 13 11/28/2014 0818   CREATININE 0.83 11/28/2014 0818      Component Value Date/Time   CALCIUM 9.3 11/28/2014 0818   ALKPHOS 60 11/28/2014 0818   AST 16 11/28/2014 0818   ALT 15 11/28/2014 0818   BILITOT 0.3 11/28/2014 0818      Lab Results  Component Value Date   WBC 6.3 11/28/2014   HGB 14.1 11/28/2014   HCT 41.7 11/28/2014   MCV 91.0 11/28/2014   PLT 300.0 11/28/2014    Lab Results  Component Value Date   TSH 3.76 11/28/2014    Vit D level 34.9 -needs to start back on it   Patient Active Problem List   Diagnosis Date Noted  . Lower abdominal pain 09/26/2014  . Low back pain 09/26/2014  . Acute bronchitis 11/22/2013  . Encounter for Medicare annual wellness exam 09/24/2013  . Enlarged lymph node 03/07/2013  . Sarcoid 08/09/2012  . Mixed incontinence urge and stress 03/16/2012  . ALOPECIA 11/14/2010  . GERD 01/24/2009  . NEOPLASM, MALIGNANT, BREAST, HX OF 01/02/2009  . Hyperlipidemia 11/17/2007  . VAGINITIS, ATROPHIC, SYMPTOMATIC 11/17/2007  . Osteopenia 11/17/2007  .  TINNITUS 09/07/2007  . ALLERGIC RHINITIS 09/07/2007  . DIVERTICULOSIS, COLON 09/07/2007  . Fatty liver 09/07/2007  . DIVERTICULITIS, HX OF 08/12/2007   Past Medical History  Diagnosis Date  . Allergic rhinitis   . Diverticulosis   . History of shingles   . HLD (hyperlipidemia)   . Atrophic vaginitis   . Osteopenia   . Sliding hiatal hernia   . GERD (gastroesophageal reflux disease)   . Arthritis   . Breast cancer     Right mastectomy  . IBS (irritable bowel syndrome)   . H/O: pneumonia    Past Surgical History  Procedure Laterality Date  . Dilation and curettage of uterus  10/1999  . Laparoscopic total hysterectomy  10/03    for fibroids/polyp  . Appendectomy    . Breast lumpectomy    . Mastectomy  7/10    right  . Colonoscopy  02/2010    divertics, rpt 5 years  . Dexa  2005    osteopenia, no change from 2003  . Total abdominal hysterectomy  2003    for fibroids/polyps  . Basal cell carcinoma excision      scalp   History  Substance Use Topics  . Smoking status: Former Smoker    Quit date: 08/31/1968  . Smokeless tobacco: Never Used  . Alcohol Use: Yes     Comment: 1-2 glasses of wine/weekly   Family History  Problem Relation Age of Onset  . Prostate cancer Father   . Stomach cancer Paternal Grandfather   . Heart attack Paternal Grandmother   . Prostate cancer Brother   . Lymphoma Brother    Allergies  Allergen Reactions  . Meloxicam     REACTION: rash  . Niacin And Related Other (See Comments)    Caused black and blue marks all over body   Current Outpatient Prescriptions on File Prior to Visit  Medication Sig Dispense Refill  . calcium-vitamin D (OSCAL WITH D) 500-200 MG-UNIT per tablet Take 1 tablet by mouth daily.    . clobetasol (TEMOVATE) 0.05 % external solution Use as directed    . conjugated estrogens (PREMARIN) vaginal cream INSERT A SMALL AMOUNT USING APPLICATOR  INTRAVAGINALLY 2 TIMES PER WEEK AS      DIRECTED 127.5 g 5  . cyclobenzaprine  (FLEXERIL) 10 MG tablet Take 1 tablet (10 mg total) by mouth 3 (three) times daily as needed. (Patient not taking: Reported on 12/05/2014) 30 tablet 5  . fish oil-omega-3 fatty acids 1000 MG capsule Take 1 g by mouth daily.     . methocarbamol (ROBAXIN) 500 MG tablet Take 500 mg by mouth as needed for muscle spasms.    Marland Kitchen  Multiple Vitamins-Minerals (CENTRUM SILVER ULTRA WOMENS PO) Take by mouth.    . naproxen (NAPROSYN) 500 MG tablet TAKE ONE TABLET BY MOUTH TWICE DAILY    AS NEEDED WITH FOOD 30 tablet 5  . omeprazole (PRILOSEC) 20 MG capsule TAKE 1 CAPSULE BY MOUTH EVERY DAY AS NEEDED. 30 capsule 1   No current facility-administered medications on file prior to visit.     Review of Systems Review of Systems  Constitutional: Negative for fever, appetite change, fatigue and unexpected weight change.  Eyes: Negative for pain and visual disturbance.  Respiratory: Negative for cough and shortness of breath.   Cardiovascular: Negative for cp or palpitations    Gastrointestinal: Negative for nausea, diarrhea and constipation.  Genitourinary: Negative for urgency and frequency.  Skin: Negative for pallor or rash   Neurological: Negative for weakness, light-headedness, numbness and headaches.  Hematological: Negative for adenopathy. Does not bruise/bleed easily.  Psychiatric/Behavioral: Negative for dysphoric mood. The patient is not nervous/anxious.         Objective:   Physical Exam  Constitutional: She appears well-developed and well-nourished. No distress.  obese and well appearing   HENT:  Head: Normocephalic and atraumatic.  Right Ear: External ear normal.  Left Ear: External ear normal.  Mouth/Throat: Oropharynx is clear and moist.  Eyes: Conjunctivae and EOM are normal. Pupils are equal, round, and reactive to light. No scleral icterus.  Neck: Normal range of motion. Neck supple. No JVD present. Carotid bruit is not present. No thyromegaly present.  Cardiovascular: Normal rate,  regular rhythm, normal heart sounds and intact distal pulses.  Exam reveals no gallop.   Pulmonary/Chest: Effort normal and breath sounds normal. No respiratory distress. She has no wheezes. She exhibits no tenderness.  Abdominal: Soft. Bowel sounds are normal. She exhibits no distension, no abdominal bruit and no mass. There is no tenderness.  Genitourinary: No breast swelling, tenderness, discharge or bleeding.  Breast exam: No mass, nodules, thickening, tenderness, bulging, retraction, inflamation, nipple discharge or skin changes noted.  No axillary or clavicular LA.      Musculoskeletal: Normal range of motion. She exhibits no edema or tenderness.  Lymphadenopathy:    She has no cervical adenopathy.  Neurological: She is alert. She has normal reflexes. No cranial nerve deficit. She exhibits normal muscle tone. Coordination normal.  Skin: Skin is warm and dry. No rash noted. No erythema. No pallor.  Psychiatric: She has a normal mood and affect.          Assessment & Plan:   Problem List Items Addressed This Visit      Musculoskeletal and Integument   Osteopenia    Disc need for calcium/ vitamin D/ wt bearing exercise and bone density test every 2 y to monitor Disc safety/ fracture risk in detail  Due for dexa-will refer  No fx  Will get back on vit D        Other   Encounter for Medicare annual wellness exam - Primary    Reviewed health habits including diet and exercise and skin cancer prevention Reviewed appropriate screening tests for age  Also reviewed health mt list, fam hx and immunization status , as well as social and family history   See HPI Labs rev prevnar vaccine today  Stop at check out for referral for bone density test  Get back on daily vitamin D   Try to keep up some regular exercise       Estrogen deficiency   Relevant Orders   DG  Bone Density   Hyperlipidemia    Disc goals for lipids and reasons to control them Rev labs with pt Rev low sat fat  diet in detail        Other Visit Diagnoses    Need for vaccination with 13-polyvalent pneumococcal conjugate vaccine        Relevant Orders    Pneumococcal conjugate vaccine 13-valent IM (Completed)

## 2014-12-06 NOTE — Assessment & Plan Note (Signed)
Disc need for calcium/ vitamin D/ wt bearing exercise and bone density test every 2 y to monitor Disc safety/ fracture risk in detail  Due for dexa-will refer  No fx  Will get back on vit D

## 2014-12-06 NOTE — Assessment & Plan Note (Signed)
Px for cipro and flagyl written for upcoming out of country trip to use if needed

## 2014-12-06 NOTE — Assessment & Plan Note (Signed)
Disc goals for lipids and reasons to control them Rev labs with pt Rev low sat fat diet in detail   

## 2014-12-06 NOTE — Assessment & Plan Note (Signed)
Reviewed health habits including diet and exercise and skin cancer prevention Reviewed appropriate screening tests for age  Also reviewed health mt list, fam hx and immunization status , as well as social and family history   See HPI Labs rev prevnar vaccine today  Stop at check out for referral for bone density test  Get back on daily vitamin D   Try to keep up some regular exercise

## 2015-01-17 ENCOUNTER — Encounter: Payer: Self-pay | Admitting: Gastroenterology

## 2015-02-19 ENCOUNTER — Encounter: Payer: Self-pay | Admitting: *Deleted

## 2015-03-08 ENCOUNTER — Encounter: Payer: Self-pay | Admitting: Gastroenterology

## 2015-05-31 ENCOUNTER — Ambulatory Visit (INDEPENDENT_AMBULATORY_CARE_PROVIDER_SITE_OTHER): Payer: Medicare HMO | Admitting: Family Medicine

## 2015-05-31 ENCOUNTER — Encounter: Payer: Self-pay | Admitting: Gastroenterology

## 2015-05-31 ENCOUNTER — Encounter: Payer: Self-pay | Admitting: Family Medicine

## 2015-05-31 VITALS — BP 128/74 | HR 85 | Temp 98.7°F | Ht 61.0 in | Wt 151.5 lb

## 2015-05-31 DIAGNOSIS — M7062 Trochanteric bursitis, left hip: Secondary | ICD-10-CM

## 2015-05-31 DIAGNOSIS — M7061 Trochanteric bursitis, right hip: Secondary | ICD-10-CM | POA: Insufficient documentation

## 2015-05-31 DIAGNOSIS — R1030 Lower abdominal pain, unspecified: Secondary | ICD-10-CM | POA: Diagnosis not present

## 2015-05-31 DIAGNOSIS — K635 Polyp of colon: Secondary | ICD-10-CM | POA: Diagnosis not present

## 2015-05-31 DIAGNOSIS — K589 Irritable bowel syndrome without diarrhea: Secondary | ICD-10-CM

## 2015-05-31 LAB — POCT URINALYSIS DIPSTICK
BILIRUBIN UA: NEGATIVE
GLUCOSE UA: NEGATIVE
KETONES UA: NEGATIVE
NITRITE UA: NEGATIVE
Protein, UA: NEGATIVE
RBC UA: NEGATIVE
Spec Grav, UA: 1.02
Urobilinogen, UA: 0.2
pH, UA: 6

## 2015-05-31 NOTE — Progress Notes (Signed)
Subjective:    Patient ID: Judith [name], female    DOB: 09/05/1945, 69 y.o.   MRN: [id number]  HPI Here for continued abdominal bloating  Going on for a few months (worse) Was in for it in Jan  Abd xray showed constipation   Feels uncomfortable-esp when she lies down  Aches all the time  Has hx of diverticulosis and IBS Also constipation on XR in Jan -but it is rare for her to get constipated  (less than once per month)   When she has loose stool - 3 in am and it calms down  The rest of the day she is fine   colonosc 7/11 - overdue for her 5 y colonoscopy  Wants to get that scheduled  Stools are occ grey in color  No blood  Sometimes liquid   Takes probiotics Diet has not changed Trying to eat more fiber   Thinks her trochanteric bursitis is getting worse-both sides No longer has a rheumatologist to go to  Is uncomfortable (at night especially)  Had and injection years ago and it helped  She still walks regularly and it bothers her    Patient Active Problem List   Diagnosis Date Noted  . IBS (irritable colon syndrome) 05/31/2015  . Colon polyps 05/31/2015  . Trochanteric bursitis of both hips 05/31/2015  . Estrogen deficiency 12/05/2014  . Lower abdominal pain 09/26/2014  . Low back pain 09/26/2014  . Acute bronchitis 11/22/2013  . Encounter for Medicare annual wellness exam 09/24/2013  . Enlarged lymph node 03/07/2013  . Sarcoid ([place]) 08/09/2012  . Mixed incontinence urge and stress 03/16/2012  . ALOPECIA 11/14/2010  . GERD 01/24/2009  . NEOPLASM, MALIGNANT, BREAST, HX OF 01/02/2009  . Hyperlipidemia 11/17/2007  . VAGINITIS, ATROPHIC, SYMPTOMATIC 11/17/2007  . Osteopenia 11/17/2007  . TINNITUS 09/07/2007  . ALLERGIC RHINITIS 09/07/2007  . DIVERTICULOSIS, COLON 09/07/2007  . Fatty liver 09/07/2007  . History of diverticulitis 08/12/2007   Past Medical History  Diagnosis Date  . Allergic rhinitis   . Diverticulosis   . History of shingles   .  HLD (hyperlipidemia)   . Atrophic vaginitis   . Osteopenia   . Sliding hiatal hernia   . GERD (gastroesophageal reflux disease)   . Arthritis   . Breast cancer     Right mastectomy  . IBS (irritable bowel syndrome)   . H/O: pneumonia    Past Surgical History  Procedure Laterality Date  . Dilation and curettage of uterus  10/1999  . Laparoscopic total hysterectomy  10/03    for fibroids/polyp  . Appendectomy    . Breast lumpectomy    . Mastectomy  7/10    right  . Colonoscopy  02/2010    divertics, rpt 5 years  . Dexa  2005    osteopenia, no change from 2003  . Total abdominal hysterectomy  2003    for fibroids/polyps  . Basal cell carcinoma excision      scalp   Social History  Substance Use Topics  . Smoking status: Former Smoker    Quit date: 08/31/1968  . Smokeless tobacco: Never Used  . Alcohol Use: 0.0 oz/week    0 Standard drinks or equivalent per week     Comment: 1-2 glasses of wine/weekly   Family History  Problem Relation Age of Onset  . Prostate cancer Father   . Stomach cancer Paternal Grandfather   . Heart attack Paternal Grandmother   . Prostate cancer Brother   .  Lymphoma Brother    Allergies  Allergen Reactions  . Meloxicam     REACTION: rash  . Niacin And Related Other (See Comments)    Caused black and blue marks all over body   Current Outpatient Prescriptions on File Prior to Visit  Medication Sig Dispense Refill  . calcium-vitamin D (OSCAL WITH D) 500-200 MG-UNIT per tablet Take 1 tablet by mouth daily.    . clobetasol (TEMOVATE) 0.05 % external solution Use as directed    . conjugated estrogens (PREMARIN) vaginal cream INSERT A SMALL AMOUNT USING APPLICATOR  INTRAVAGINALLY 2 TIMES PER WEEK AS      DIRECTED 127.5 g 5  . cyclobenzaprine (FLEXERIL) 10 MG tablet Take 1 tablet (10 mg total) by mouth 3 (three) times daily as needed. 30 tablet 5  . fish oil-omega-3 fatty acids 1000 MG capsule Take 1 g by mouth daily.     . hydroxychloroquine  (PLAQUENIL) 200 MG tablet Take 200 mg by mouth daily.    . methocarbamol (ROBAXIN) 500 MG tablet Take 1 tablet (500 mg total) by mouth every 8 (eight) hours as needed for muscle spasms. For muscle spasm as needed 30 tablet 1  . Multiple Vitamins-Minerals (CENTRUM SILVER ULTRA WOMENS PO) Take by mouth.    . naproxen (NAPROSYN) 500 MG tablet TAKE ONE TABLET BY MOUTH TWICE DAILY    AS NEEDED WITH FOOD 30 tablet 5  . omeprazole (PRILOSEC) 20 MG capsule TAKE 1 CAPSULE BY MOUTH EVERY DAY AS NEEDED. 30 capsule 11   No current facility-administered medications on file prior to visit.    Review of Systems    Review of Systems  Constitutional: Negative for fever, appetite change, fatigue and unexpected weight change.  Eyes: Negative for pain and visual disturbance.  Respiratory: Negative for cough and shortness of breath.   Cardiovascular: Negative for cp or palpitations    Gastrointestinal: Negative for nausea, vomiting and blood in stool or dark stool  Genitourinary: Negative for urgency and frequency.  Skin: Negative for pallor or rash   Neurological: Negative for weakness, light-headedness, numbness and headaches.  Hematological: Negative for adenopathy. Does not bruise/bleed easily.  Psychiatric/Behavioral: Negative for dysphoric mood. The patient is not nervous/anxious.      Objective:   Physical Exam  Constitutional: She appears well-developed and well-nourished. No distress.  overwt and well app  HENT:  Head: Normocephalic and atraumatic.  Mouth/Throat: Oropharynx is clear and moist.  Eyes: Conjunctivae and EOM are normal. Pupils are equal, round, and reactive to light. No scleral icterus.  Neck: Normal range of motion. Neck supple.  Cardiovascular: Normal rate, regular rhythm and normal heart sounds.   Pulmonary/Chest: Effort normal and breath sounds normal. No respiratory distress. She has no wheezes. She has no rales.  Abdominal: Soft. Bowel sounds are normal. She exhibits no  distension, no abdominal bruit, no pulsatile midline mass and no mass. There is no hepatosplenomegaly. There is tenderness in the right lower quadrant and left lower quadrant. There is no rigidity, no rebound, no guarding, no CVA tenderness and negative Murphy's sign.  Musculoskeletal: She exhibits tenderness. She exhibits no edema.  Tender at bilat greater troch areas with some pain on int rot of hips No groin tenderness Nl gait   Lymphadenopathy:    She has no cervical adenopathy.  Neurological: She is alert. She has normal reflexes. She displays no atrophy. No cranial nerve deficit. She exhibits normal muscle tone. Coordination and gait normal.  Skin: Skin is warm and dry. No  erythema. No pallor.  Psychiatric: She has a normal mood and affect.          Assessment & Plan:   Problem List Items Addressed This Visit      Digestive   Colon polyps    Ref for her 5y recall colonoscopy      Relevant Orders   Ambulatory referral to Gastroenterology   IBS (irritable colon syndrome) - Primary    More problems with intermittent loose stool/ and cramping with bloating  (no blood) Will keep a food diary  Ref to GI (she is also due for her 5 year recall colonoscopy)      Relevant Orders   Ambulatory referral to Gastroenterology     Musculoskeletal and Integument   Trochanteric bursitis of both hips    Recurrent  Has had injection in the past  Will ref to Dr Lorelei Pont for eval and treat (sport med)        Other   Lower abdominal pain    Cramping/ bloating and stool urgency  Has hx of IBS and diverticulosis Ref to GI for this (also due for recall colonosc)      Relevant Orders   POCT urinalysis dipstick (Completed)

## 2015-05-31 NOTE — Progress Notes (Signed)
Pre visit review using our clinic review tool, if applicable. No additional management support is needed unless otherwise documented below in the visit note. 

## 2015-05-31 NOTE — Patient Instructions (Signed)
I think you have hip (trochanteric) bursitis  Please make an appointment with Dr Lorelei Pont - our sport med doctor for further eval  Stop at check out for referral to GI for your IBS symptoms and 5 year recall colonoscopy   Keep a food journal - and see if certain foods worsen your symptoms  ? Perhaps dairy makes you worse

## 2015-06-02 NOTE — Assessment & Plan Note (Signed)
Ref for her 5y recall colonoscopy

## 2015-06-02 NOTE — Assessment & Plan Note (Signed)
Cramping/ bloating and stool urgency  Has hx of IBS and diverticulosis Ref to GI for this (also due for recall colonosc)

## 2015-06-02 NOTE — Assessment & Plan Note (Signed)
Recurrent  Has had injection in the past  Will ref to Dr Lorelei Pont for eval and treat (sport med)

## 2015-06-02 NOTE — Assessment & Plan Note (Signed)
More problems with intermittent loose stool/ and cramping with bloating  (no blood) Will keep a food diary  Ref to GI (she is also due for her 5 year recall colonoscopy)

## 2015-06-05 ENCOUNTER — Encounter: Payer: Self-pay | Admitting: Family Medicine

## 2015-06-05 ENCOUNTER — Ambulatory Visit (INDEPENDENT_AMBULATORY_CARE_PROVIDER_SITE_OTHER): Payer: Medicare HMO | Admitting: Family Medicine

## 2015-06-05 VITALS — BP 102/76 | HR 91 | Temp 98.9°F | Ht 61.0 in | Wt 152.8 lb

## 2015-06-05 DIAGNOSIS — M25552 Pain in left hip: Secondary | ICD-10-CM

## 2015-06-05 DIAGNOSIS — M7061 Trochanteric bursitis, right hip: Secondary | ICD-10-CM | POA: Diagnosis not present

## 2015-06-05 DIAGNOSIS — M7062 Trochanteric bursitis, left hip: Secondary | ICD-10-CM

## 2015-06-05 DIAGNOSIS — M25551 Pain in right hip: Secondary | ICD-10-CM | POA: Diagnosis not present

## 2015-06-05 DIAGNOSIS — M6798 Unspecified disorder of synovium and tendon, other site: Secondary | ICD-10-CM

## 2015-06-05 DIAGNOSIS — M67952 Unspecified disorder of synovium and tendon, left thigh: Secondary | ICD-10-CM | POA: Diagnosis not present

## 2015-06-05 DIAGNOSIS — M67951 Unspecified disorder of synovium and tendon, right thigh: Secondary | ICD-10-CM

## 2015-06-05 MED ORDER — METHYLPREDNISOLONE ACETATE 40 MG/ML IJ SUSP
80.0000 mg | Freq: Once | INTRAMUSCULAR | Status: AC
  Administered 2015-06-05: 80 mg via INTRA_ARTICULAR

## 2015-06-05 NOTE — Progress Notes (Signed)
Pre visit review using our clinic review tool, if applicable. No additional management support is needed unless otherwise documented below in the visit note. 

## 2015-06-05 NOTE — Progress Notes (Signed)
Dr. Frederico Hamman T. Brownie Nehme, MD, [facility] [place], [address] Phone: [phone] Fax: [phone]  06/05/2015  Patient: Judith [name], MRN: [id number], DOB: [birth year], 69 y.o.  Primary Physician:  Loura Pardon, MD  Chief Complaint: Hip Pain and Back Pain  Subjective:   Judith [name] is a 69 y.o. very pleasant female patient who presents with the following:  69 yo patient with back pain and B hip pain: She is intermittently had some trochanteric bursitis off and on for some time, but is been flared up off and on since May. She was on an extensive trip in [place] and did a lot of walking then. She also went to the beach in August and ever since August her pain is not related on either side of her hip. This is limiting her daily activities and she is limited in her ability to exercise and walk currently. She is not having any numbness, tingling or radiculopathy. She denies groin pain.  b gtb Sometimes will ache when overexercise. May they flared up, and then went to the beach in August and flared up some. Both sides.   On Plaquenil for Sarcoid.  Was seeing Dr. Charlestine Night.   Past Medical History, Surgical History, Social History, Family History, Problem List, Medications, and Allergies have been reviewed and updated if relevant.  Patient Active Problem List   Diagnosis Date Noted  . IBS (irritable colon syndrome) 05/31/2015  . Colon polyps 05/31/2015  . Trochanteric bursitis of both hips 05/31/2015  . Estrogen deficiency 12/05/2014  . Lower abdominal pain 09/26/2014  . Low back pain 09/26/2014  . Acute bronchitis 11/22/2013  . Encounter for Medicare annual wellness exam 09/24/2013  . Enlarged lymph node 03/07/2013  . Sarcoid ([place]) 08/09/2012  . Mixed incontinence urge and stress 03/16/2012  . ALOPECIA 11/14/2010  . GERD 01/24/2009  . NEOPLASM, MALIGNANT, BREAST, HX OF 01/02/2009  . Hyperlipidemia 11/17/2007  .  VAGINITIS, ATROPHIC, SYMPTOMATIC 11/17/2007  . Osteopenia 11/17/2007  . TINNITUS 09/07/2007  . ALLERGIC RHINITIS 09/07/2007  . DIVERTICULOSIS, COLON 09/07/2007  . Fatty liver 09/07/2007  . History of diverticulitis 08/12/2007    Past Medical History  Diagnosis Date  . Allergic rhinitis   . Diverticulosis   . History of shingles   . HLD (hyperlipidemia)   . Atrophic vaginitis   . Osteopenia   . Sliding hiatal hernia   . GERD (gastroesophageal reflux disease)   . Arthritis   . Breast cancer ([place])     Right mastectomy  . IBS (irritable bowel syndrome)   . H/O: pneumonia     Past Surgical History  Procedure Laterality Date  . Dilation and curettage of uterus  10/1999  . Laparoscopic total hysterectomy  10/03    for fibroids/polyp  . Appendectomy    . Breast lumpectomy    . Mastectomy  7/10    right  . Colonoscopy  02/2010    divertics, rpt 5 years  . Dexa  2005    osteopenia, no change from 2003  . Total abdominal hysterectomy  2003    for fibroids/polyps  . Basal cell carcinoma excision      scalp    Social History   Social History  . Marital Status: Married    Spouse Name: N/A  . Number of Children: 2  . Years of Education: N/A   Occupational History  . Retired    Social History Main Topics  . Smoking  status: Former Smoker    Quit date: 08/31/1968  . Smokeless tobacco: Never Used  . Alcohol Use: 0.0 oz/week    0 Standard drinks or equivalent per week     Comment: 1-2 glasses of wine/weekly  . Drug Use: No  . Sexual Activity: Not on file   Other Topics Concern  . Not on file   Social History Narrative   Married      2 sons      Retired      2 cups coffee/daily      Gym 3-4 times/week    Family History  Problem Relation Age of Onset  . Prostate cancer Father   . Stomach cancer Paternal Grandfather   . Heart attack Paternal Grandmother   . Prostate cancer Brother   . Lymphoma Brother     Allergies  Allergen Reactions  . Meloxicam       REACTION: rash  . Niacin And Related Other (See Comments)    Caused black and blue marks all over body    Medication list reviewed and updated in full in [facility].  GEN: No fevers, chills. Nontoxic. Primarily MSK c/o today. MSK: Detailed in the HPI GI: tolerating PO intake without difficulty Neuro: No numbness, parasthesias, or tingling associated. Otherwise the pertinent positives of the ROS are noted above.   Objective:   BP 102/76 mmHg  Pulse 91  Temp(Src) 98.9 F (37.2 C) (Oral)  Ht 5' 1"  (1.549 m)  Wt 152 lb 12 oz (69.287 kg)  BMI 28.88 kg/m2   GEN: WDWN, NAD, Non-toxic, Alert & Oriented x 3 HEENT: Atraumatic, Normocephalic.  Ears and Nose: No external deformity. EXTR: No clubbing/cyanosis/edema NEURO: Normal gait.  PSYCH: Normally interactive. Conversant. Not depressed or anxious appearing.  Calm demeanor.   HIP EXAM: SIDE: B ROM: Abduction, Flexion, Internal and External range of motion: FULL Pain with terminal IROM and EROM: none GTB: markedly ttp SLR: NEG Knees: No effusion FABER: NT REVERSE FABER: NT, neg Piriformis: NT at direct palpation Str: flexion: 5/5, but 4+/5 on L abduction: 3++/5 B Adduction 5/5 B  TTP along pelvic rim   Radiology: *RADIOLOGY REPORT*   Clinical Data: [place] 1 month ago with low back pain   LUMBAR SPINE - COMPLETE 4+ VIEW   Comparison: None.   Findings: There is a 5 mm anterolisthesis of L4 on L5 most likely due to degenerative change.  There is degenerative change at the T11-T12 and T12-L1 level but the intervertebral disc space in the lumbar spine are relatively well preserved.  No compression deformity is seen.  There is degenerative change involving facet joints of L4-5 and L5-S1.  The SI joints appear normally corticated.   IMPRESSION:   1.  5 mm anterolisthesis of L4 on L5 most likely due to degenerative change. 2.  Intervertebral disc spaces in the lumbar spine appear normal with some degenerative  change at T11- T12 and T12-L1 levels. 3.  No compression deformity     Original Report Authenticated By: Ivar Drape, M.D.   *RADIOLOGY REPORT*   Clinical Data: [place] [facility] 1 month ago with low back and sacral pain   SACRUM AND COCCYX - 2+ VIEW   Comparison: None.   Findings: The sacral coccygeal elements are in normal alignment. The sacral foramen appear corticated.  The SI joints are corticated.  The pelvic rami are intact.   IMPRESSION: No fracture.     Original Report Authenticated By: Ivar Drape, M.D.   Assessment and Plan:  Greater trochanteric bursitis of both hips  Bilateral hip pain - Plan: methylPREDNISolone acetate (DEPO-MEDROL) injection 80 mg, methylPREDNISolone acetate (DEPO-MEDROL) injection 80 mg  Tendinopathy of left gluteus medius  Tendinopathy of right gluteus medius  I reviewed with the patient the structures involved and how they related to diagnosis.   Patient was given a rehabilitation protocol emphasizing core rehab, partcularly addressing abductor weakness, and stretching of the pelvic musculature, hips, and ITB. Markedly weak hip abductors, likely predisposing to above.   Trochanteric bursa injections have clearly been shown to help with acute bursitis, and the patient would benefit.   Trochanteric Bursitis Injection, R Verbal consent obtained. Risks (including infection, potential atrophy), benefits, and alternatives reviewed. Greater trochanter sterilely prepped with Chloraprep. Ethyl Chloride used for anesthesia. 8 cc of Lidocaine 1% injected with Depo-Medrol 80 mg into trochanteric bursa at area of maximal tenderness at greater trochanter. Needle taken to bone to troch bursa, flows easily. Bursa massaged. No bleeding and no complications. Decreased pain after injection. Needle: 22 gauge, 2 in Trochanteric Bursitis Injection, L Verbal consent obtained. Risks (including infection, potential atrophy), benefits, and alternatives reviewed. Greater  trochanter sterilely prepped with Chloraprep. Ethyl Chloride used for anesthesia. 8 cc of Lidocaine 1% injected with Depo-Medrol 80 mg into trochanteric bursa at area of maximal tenderness at greater trochanter. Needle taken to bone to troch bursa, flows easily. Bursa massaged. No bleeding and no complications. Decreased pain after injection. Needle: 22 gauge, 2 in  Follow-up:  Call or return to clinic prn if these symptoms worsen or fail to improve as anticipated.   Signed,  Maud Deed. Gracen Ringwald, MD   Patient's Medications  New Prescriptions   No medications on file  Previous Medications   CALCIUM-VITAMIN D (OSCAL WITH D) 500-200 MG-UNIT PER TABLET    Take 1 tablet by mouth daily.   CONJUGATED ESTROGENS (PREMARIN) VAGINAL CREAM    INSERT A SMALL AMOUNT USING APPLICATOR  INTRAVAGINALLY 2 TIMES PER WEEK AS      DIRECTED   FISH OIL-OMEGA-3 FATTY ACIDS 1000 MG CAPSULE    Take 1 g by mouth daily.    HYDROXYCHLOROQUINE (PLAQUENIL) 200 MG TABLET    Take 200 mg by mouth daily.   METHOCARBAMOL (ROBAXIN) 500 MG TABLET    Take 1 tablet (500 mg total) by mouth every 8 (eight) hours as needed for muscle spasms. For muscle spasm as needed   MULTIPLE VITAMINS-MINERALS (CENTRUM SILVER ULTRA WOMENS PO)    Take by mouth.   NAPROXEN (NAPROSYN) 500 MG TABLET    TAKE ONE TABLET BY MOUTH TWICE DAILY    AS NEEDED WITH FOOD   OMEPRAZOLE (PRILOSEC) 20 MG CAPSULE    TAKE 1 CAPSULE BY MOUTH EVERY DAY AS NEEDED.  Modified Medications   No medications on file  Discontinued Medications   CLOBETASOL (TEMOVATE) 0.05 % EXTERNAL SOLUTION    Use as directed   CYCLOBENZAPRINE (FLEXERIL) 10 MG TABLET    Take 1 tablet (10 mg total) by mouth 3 (three) times daily as needed.

## 2015-06-27 ENCOUNTER — Ambulatory Visit (AMBULATORY_SURGERY_CENTER): Payer: Self-pay | Admitting: *Deleted

## 2015-06-27 VITALS — Ht 61.5 in | Wt 152.0 lb

## 2015-06-27 DIAGNOSIS — Z86010 Personal history of colonic polyps: Secondary | ICD-10-CM

## 2015-06-27 MED ORDER — NA SULFATE-K SULFATE-MG SULF 17.5-3.13-1.6 GM/177ML PO SOLN: 1.0000 | Freq: Once | ORAL | Status: DC

## 2015-06-27 NOTE — Progress Notes (Signed)
No egg or soy allergy No issues with past sedation No diet pills No home 02 use  emmi video to email [email or web address]    Pt has aetna- will not cover suprep sample given Medication Samples have been provided to the patient.  Drug name: suprep  Qty: 1  LOT: [id number]  Exp.Date: 84-18  The patient has been instructed regarding the correct time, dose, and frequency of taking this medication, including desired effects and most common side effects.   Judith [name] 1:33 PM 06/27/2015

## 2015-07-17 ENCOUNTER — Encounter: Payer: Medicare HMO | Admitting: Gastroenterology

## 2015-07-18 ENCOUNTER — Encounter: Payer: Self-pay | Admitting: Gastroenterology

## 2015-07-29 ENCOUNTER — Encounter: Payer: Self-pay | Admitting: Gastroenterology

## 2015-07-29 ENCOUNTER — Ambulatory Visit (AMBULATORY_SURGERY_CENTER): Payer: Medicare HMO | Admitting: Gastroenterology

## 2015-07-29 VITALS — BP 119/65 | HR 77 | Temp 97.5°F | Resp 14 | Ht 61.0 in | Wt 152.0 lb

## 2015-07-29 DIAGNOSIS — D128 Benign neoplasm of rectum: Secondary | ICD-10-CM

## 2015-07-29 DIAGNOSIS — Z86010 Personal history of colonic polyps: Secondary | ICD-10-CM

## 2015-07-29 DIAGNOSIS — K219 Gastro-esophageal reflux disease without esophagitis: Secondary | ICD-10-CM | POA: Diagnosis not present

## 2015-07-29 DIAGNOSIS — M545 Low back pain: Secondary | ICD-10-CM | POA: Diagnosis not present

## 2015-07-29 DIAGNOSIS — E669 Obesity, unspecified: Secondary | ICD-10-CM | POA: Diagnosis not present

## 2015-07-29 MED ORDER — SODIUM CHLORIDE 0.9 % IV SOLN: 500.0000 mL | INTRAVENOUS | Status: DC

## 2015-07-29 NOTE — Progress Notes (Signed)
Called to room to assist during endoscopic procedure.  Patient ID and intended procedure confirmed with present staff. Received instructions for my participation in the procedure from the performing physician.  

## 2015-07-29 NOTE — Patient Instructions (Signed)
Impressions/recommendations:  Rectal polyp (handout given) Diverticulosis (handout given) High Fiber diet (handout given)  Air contrast barium enema no sooner than 3 weeks.    YOU HAD AN ENDOSCOPIC PROCEDURE TODAY AT [facility]:   Refer to the procedure report that was given to you for any specific questions about what was found during the examination.  If the procedure report does not answer your questions, please call your gastroenterologist to clarify.  If you requested that your care partner not be given the details of your procedure findings, then the procedure report has been included in a sealed envelope for you to review at your convenience later.  YOU SHOULD EXPECT: Some feelings of bloating in the abdomen. Passage of more gas than usual.  Walking can help get rid of the air that was put into your GI tract during the procedure and reduce the bloating. If you had a lower endoscopy (such as a colonoscopy or flexible sigmoidoscopy) you may notice spotting of blood in your stool or on the toilet paper. If you underwent a bowel prep for your procedure, you may not have a normal bowel movement for a few days.  Please Note:  You might notice some irritation and congestion in your nose or some drainage.  This is from the oxygen used during your procedure.  There is no need for concern and it should clear up in a day or so.  SYMPTOMS TO REPORT IMMEDIATELY:   Following lower endoscopy (colonoscopy or flexible sigmoidoscopy):  Excessive amounts of blood in the stool  Significant tenderness or worsening of abdominal pains  Swelling of the abdomen that is new, acute  Fever of 100F or higher  For urgent or emergent issues, a gastroenterologist can be reached at any hour by calling [phone].   DIET: Your first meal following the procedure should be a small meal and then it is ok to progress to your normal diet. Heavy or fried foods are harder to digest and may make you  feel nauseous or bloated.  Likewise, meals heavy in dairy and vegetables can increase bloating.  Drink plenty of fluids but you should avoid alcoholic beverages for 24 hours.  ACTIVITY:  You should plan to take it easy for the rest of today and you should NOT DRIVE or use heavy machinery until tomorrow (because of the sedation medicines used during the test).    FOLLOW UP: Our staff will call the number listed on your records the next business day following your procedure to check on you and address any questions or concerns that you may have regarding the information given to you following your procedure. If we do not reach you, we will leave a message.  However, if you are feeling well and you are not experiencing any problems, there is no need to return our call.  We will assume that you have returned to your regular daily activities without incident.  If any biopsies were taken you will be contacted by phone or by letter within the next 1-3 weeks.  Please call us at [phone] if you have not heard about the biopsies in 3 weeks.    SIGNATURES/CONFIDENTIALITY: You and/or your care partner have signed paperwork which will be entered into your electronic medical record.  These signatures attest to the fact that that the information above on your After Visit Summary has been reviewed and is understood.  Full responsibility of the confidentiality of this discharge information lies with you and/or your  care-partner.

## 2015-07-29 NOTE — Progress Notes (Signed)
Report to PACU, RN, vss, BBS= Clear.  

## 2015-07-29 NOTE — Op Note (Signed)
[facility]  [address] [place], [id number]   COLONOSCOPY PROCEDURE REPORT  PATIENT: Judith [name], Judith [name]  MR#: [id number] BIRTHDATE: Jun 01, 1946 , 19  yrs. old GENDER: female ENDOSCOPIST: Ladene Artist, MD, [facility] PROCEDURE DATE:  07/29/2015 PROCEDURE:   Colonoscopy, surveillance and Colonoscopy with snare polypectomy, incomplete procedure First Screening Colonoscopy - Avg.  risk and is 50 yrs.  old or older - No.  Prior Negative Screening - Now for repeat screening. N/A  History of Adenoma - Now for follow-up colonoscopy & has been > or = to 3 yrs.  Yes hx of adenoma.  Has been 3 or more years since last colonoscopy.  Polyps removed today? Yes ASA CLASS:   Class II INDICATIONS:Surveillance due to prior colonic neoplasia and PH Colon Adenoma. MEDICATIONS: Monitored anesthesia care and Propofol 170 mg IV DESCRIPTION OF PROCEDURE:   After the risks benefits and alternatives of the procedure were thoroughly explained, informed consent was obtained.  The digital rectal exam revealed no abnormalities of the rectum.   The LB [id number]-[id number]  endoscope was introduced through the anus and advanced to the sigmoid colon. No adverse events experienced.  A tortuous, fixed sigmoid colon, was noted, was unable to safely advance-adhesions suspected.  The quality of the prep was excellent.  (Suprep was used)  The instrument was then slowly withdrawn as the colon was fully examined. Estimated blood loss is zero unless otherwise noted in this procedure report.    COLON FINDINGS: A sessile polyp measuring 6 mm in size was found in the rectum.  A polypectomy was performed with a cold snare.  The resection was complete, the polyp tissue was completely retrieved and sent to histology.   There was moderate diverticulosis noted in the sigmoid colon with associated tortuosity, angulation, luminal narrowing and muscular hypertrophy. Unable to advance beyond sigmoid colon-adhesions  suspected.  Retroflexed views revealed no abnormalities. The time to cecum =    Withdrawal time =      The scope was withdrawn and the procedure completed. COMPLICATIONS: There were no immediate complications.  ENDOSCOPIC IMPRESSION: 1.   Sessile polyp in the rectum; polypectomy performed with a cold snare 2.   Moderate diverticulosis n the sigmoid colon  RECOMMENDATIONS: 1.  Await pathology results 2.  High fiber diet with liberal fluid intake. 3.  Air Contrast Barium Enema no sooner than 3 weeks from now  eSigned:  Ladene Artist, MD, [facility] 07/29/2015 3:24 PM

## 2015-07-30 ENCOUNTER — Telehealth: Payer: Self-pay | Admitting: *Deleted

## 2015-07-30 ENCOUNTER — Telehealth: Payer: Self-pay

## 2015-07-30 DIAGNOSIS — Z1211 Encounter for screening for malignant neoplasm of colon: Secondary | ICD-10-CM

## 2015-07-30 NOTE — Telephone Encounter (Signed)
Per colonoscopy report from 07/29/15 patient needs ACBE no sooner than 3 weeks for incomplete colonoscopy.  Not able to advance beyond the sigmoid colon.  Left message for patient to call back

## 2015-07-30 NOTE — Telephone Encounter (Signed)
  Follow up Call-  Call back number 07/29/2015  Post procedure Call Back phone  # [phone]  Permission to leave phone message Yes     Patient questions:  Spoke with patient after her vm picked up.  She stated that she was fine and eating.  No questions at this time.

## 2015-07-31 ENCOUNTER — Ambulatory Visit (INDEPENDENT_AMBULATORY_CARE_PROVIDER_SITE_OTHER)
Admission: RE | Admit: 2015-07-31 | Discharge: 2015-07-31 | Disposition: A | Discharge status: Home / Self Care | Payer: Medicare HMO | Source: Ambulatory Visit | Attending: Family Medicine | Admitting: Family Medicine

## 2015-07-31 ENCOUNTER — Encounter: Payer: Self-pay | Admitting: Family Medicine

## 2015-07-31 ENCOUNTER — Ambulatory Visit (INDEPENDENT_AMBULATORY_CARE_PROVIDER_SITE_OTHER): Payer: Medicare HMO | Admitting: Family Medicine

## 2015-07-31 VITALS — BP 120/84 | HR 100 | Temp 98.2°F | Ht 61.0 in | Wt 154.8 lb

## 2015-07-31 DIAGNOSIS — M545 Low back pain, unspecified: Secondary | ICD-10-CM

## 2015-07-31 DIAGNOSIS — Z23 Encounter for immunization: Secondary | ICD-10-CM | POA: Diagnosis not present

## 2015-07-31 DIAGNOSIS — M7062 Trochanteric bursitis, left hip: Secondary | ICD-10-CM

## 2015-07-31 DIAGNOSIS — M7061 Trochanteric bursitis, right hip: Secondary | ICD-10-CM

## 2015-07-31 DIAGNOSIS — M47816 Spondylosis without myelopathy or radiculopathy, lumbar region: Secondary | ICD-10-CM | POA: Diagnosis not present

## 2015-07-31 DIAGNOSIS — M4987 Spondylopathy in diseases classified elsewhere, lumbosacral region: Secondary | ICD-10-CM

## 2015-07-31 MED ORDER — METHOCARBAMOL 500 MG PO TABS: 500.0000 mg | ORAL_TABLET | Freq: Three times a day (TID) | ORAL | Status: DC | PRN

## 2015-07-31 NOTE — Progress Notes (Signed)
Dr. Frederico Hamman T. Lasonja Lakins, MD, [facility] [place], [address] Phone: [phone] Fax: [phone]  07/31/2015  Patient: Judith [name], MRN: [id number], DOB: February 25, 1946, 69 y.o.  Primary Physician:  Loura Pardon, MD  Chief Complaint: Follow-up and Back Pain  Subjective:   Judith [name] is a 69 y.o. very pleasant female patient who presents with the following:  F/u GTB and back pain:  GTB is a lot better - but when trying to exercise or shopping in the back and on the back. Never pain free and will get some pain on the left. S/p GTB injection  The patient's primary issue right now is bilateral back pain that is essentially in conferencing her entire lower thoracic spine through the lumbar spine and including the upper cervical region.  She has diffuse muscle spasm on both sides.  Pain most of the time, and this is been an intermittent problem for quite some time, greater than a year, but it is acutely worsened compared to baseline.  No numbness, radiculopathy, and she denies any bowel or bladder incontinence.  06/05/2015 Last OV with Owens Loffler, MD  69 yo patient with back pain and B hip pain: She is intermittently had some trochanteric bursitis off and on for some time, but is been flared up off and on since May. She was on an extensive trip in [place] and did a lot of walking then. She also went to the beach in August and ever since August her pain is not related on either side of her hip. This is limiting her daily activities and she is limited in her ability to exercise and walk currently. She is not having any numbness, tingling or radiculopathy. She denies groin pain.  b gtb Sometimes will ache when overexercise. May they flared up, and then went to the beach in August and flared up some. Both sides.   On Plaquenil for Sarcoid.  Was seeing Dr. Charlestine Night.   Past Medical History, Surgical History, Social  History, Family History, Problem List, Medications, and Allergies have been reviewed and updated if relevant.  Patient Active Problem List   Diagnosis Date Noted  . IBS (irritable colon syndrome) 05/31/2015  . Colon polyps 05/31/2015  . Trochanteric bursitis of both hips 05/31/2015  . Estrogen deficiency 12/05/2014  . Lower abdominal pain 09/26/2014  . Low back pain 09/26/2014  . Acute bronchitis 11/22/2013  . Encounter for Medicare annual wellness exam 09/24/2013  . Enlarged lymph node 03/07/2013  . Sarcoid ([place]) 08/09/2012  . Mixed incontinence urge and stress 03/16/2012  . ALOPECIA 11/14/2010  . GERD 01/24/2009  . NEOPLASM, MALIGNANT, BREAST, HX OF 01/02/2009  . Hyperlipidemia 11/17/2007  . VAGINITIS, ATROPHIC, SYMPTOMATIC 11/17/2007  . Osteopenia 11/17/2007  . TINNITUS 09/07/2007  . ALLERGIC RHINITIS 09/07/2007  . DIVERTICULOSIS, COLON 09/07/2007  . Fatty liver 09/07/2007  . History of diverticulitis 08/12/2007    Past Medical History  Diagnosis Date  . Allergic rhinitis   . Diverticulosis   . History of shingles   . Atrophic vaginitis   . Osteopenia   . Sliding hiatal hernia   . GERD (gastroesophageal reflux disease)   . Arthritis   . Breast cancer ([place])     Right mastectomy  . IBS (irritable bowel syndrome)   . H/O: pneumonia     as child-viral pneumonia  . HLD (hyperlipidemia)     pt denies  . Allergy     Past Surgical  History  Procedure Laterality Date  . Dilation and curettage of uterus  10/1999  . Laparoscopic total hysterectomy  10/03    for fibroids/polyp  . Appendectomy    . Breast lumpectomy    . Mastectomy  7/10    right  . Colonoscopy  02/2010    divertics, rpt 5 years  . Dexa  2005    osteopenia, no change from 2003  . Total abdominal hysterectomy  2003    for fibroids/polyps  . Basal cell carcinoma excision      scalp  . Polypectomy      Social History   Social History  . Marital Status: Married    Spouse Name: N/A  . Number of  Children: 2  . Years of Education: N/A   Occupational History  . Retired    Social History Main Topics  . Smoking status: Former Smoker    Quit date: 08/31/1968  . Smokeless tobacco: Never Used  . Alcohol Use: 0.0 oz/week    0 Standard drinks or equivalent per week     Comment: 1-2 glasses of wine/weekly  . Drug Use: No  . Sexual Activity: Not on file   Other Topics Concern  . Not on file   Social History Narrative   Married      2 sons      Retired      2 cups coffee/daily      Gym 3-4 times/week    Family History  Problem Relation Age of Onset  . Prostate cancer Father   . Heart attack Paternal Grandmother   . Prostate cancer Brother   . Lymphoma Brother   . Stomach cancer Maternal Grandfather   . Colon cancer Neg Hx   . Rectal cancer Neg Hx   . Esophageal cancer Neg Hx     Allergies  Allergen Reactions  . Meloxicam     REACTION: rash  . Niacin And Related Other (See Comments)    Caused black and blue marks all over body    Medication list reviewed and updated in full in [place].  GEN: No fevers, chills. Nontoxic. Primarily MSK c/o today. MSK: Detailed in the HPI GI: tolerating PO intake without difficulty Neuro: No numbness, parasthesias, or tingling associated. Otherwise the pertinent positives of the ROS are noted above.   Objective:   BP 120/84 mmHg  Pulse 100  Temp(Src) 98.2 F (36.8 C) (Oral)  Ht 5' 1"  (1.549 m)  Wt 154 lb 12 oz (70.194 kg)  BMI 29.25 kg/m2   GEN: WDWN, NAD, Non-toxic, Alert & Oriented x 3 HEENT: Atraumatic, Normocephalic.  Ears and Nose: No external deformity. EXTR: No clubbing/cyanosis/edema NEURO: Normal gait.  PSYCH: Normally interactive. Conversant. Not depressed or anxious appearing.  Calm demeanor.   HIP EXAM: SIDE: B ROM: Abduction, Flexion, Internal and External range of motion: FULL Pain with terminal IROM and EROM: none GTB: minimal TTP SLR: NEG Knees: No effusion FABER: NT REVERSE FABER:  NT, neg Piriformis: NT at direct palpation Str: flexion: 5/5, but 5/5 on L abduction: 4++/5 B Adduction 5/5 B  TTP along pelvic rim   Limited in her regards to forward flexion and also pain with extension.  She has diffuse muscle spasm from approximately T10-S1 bilaterally.  She also has tenderness at the SI joints.  She also has pain in her upper buttocks region with palpation.  Straight leg raise is negative bilaterally both from a seated and a lying position.  Neurovascularly intact in  her lower extremities.  Radiology: *RADIOLOGY REPORT*   Clinical Data: [place] 1 month ago with low back pain   LUMBAR SPINE - COMPLETE 4+ VIEW   Comparison: None.   Findings: There is a 5 mm anterolisthesis of L4 on L5 most likely due to degenerative change.  There is degenerative change at the T11-T12 and T12-L1 level but the intervertebral disc space in the lumbar spine are relatively well preserved.  No compression deformity is seen.  There is degenerative change involving facet joints of L4-5 and L5-S1.  The SI joints appear normally corticated.   IMPRESSION:   1.  5 mm anterolisthesis of L4 on L5 most likely due to degenerative change. 2.  Intervertebral disc spaces in the lumbar spine appear normal with some degenerative change at T11- T12 and T12-L1 levels. 3.  No compression deformity     Original Report Authenticated By: Ivar Drape, M.D.   *RADIOLOGY REPORT*   Clinical Data: [place] 1 month ago with low back and sacral pain   SACRUM AND COCCYX - 2+ VIEW   Comparison: None.   Findings: The sacral coccygeal elements are in normal alignment. The sacral foramen appear corticated.  The SI joints are corticated.  The pelvic rami are intact.   IMPRESSION: No fracture.     Original Report Authenticated By: Ivar Drape, M.D.  Dg Lumbar Spine Complete  07/31/2015  CLINICAL DATA:  Low back pain for 1 year.  No reported injury. EXAM: LUMBAR SPINE - COMPLETE 4+ VIEW COMPARISON:   09/26/2014.  CT 08/10/2013. FINDINGS: Diffuse multilevel degenerative change with 7 mm anterolisthesis of L4 on L5 noted. Anterolisthesis is stable from prior CT of 08/10/2013. No acute abnormality identified. Pelvic calcifications consistent with phleboliths. IMPRESSION: Diffuse multilevel degenerative change noted. Stable 7 mm anterolisthesis L4 on L5. No acute bony abnormality. 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  Hopefully we can get her flare calmed down and to PT for long-term instruction on maintenance.  Patient Instructions  Naproxen 500 mg, 1 twice a day for 3 days, then    Switch to OTC Advil, 2 tablets three times a day  Muscle relaxant - 2 - 3 times a day     Follow-up: Return in about 6 weeks (around 09/11/2015).  New Prescriptions   No medications on file   Modified Medications   Modified Medication Previous Medication   METHOCARBAMOL (ROBAXIN) 500 MG TABLET methocarbamol (ROBAXIN) 500 MG tablet      Take 1 tablet (500 mg total) by mouth every 8 (eight) hours as needed for muscle spasms. For muscle spasm as needed    Take 1 tablet (500 mg total) by mouth every 8 (eight) hours as needed for muscle spasms. For muscle spasm as needed   Orders Placed This Encounter  Procedures  . DG Lumbar Spine Complete  . Flu Vaccine QUAD 36+ mos IM  . Ambulatory referral to Physical Therapy    Signed,  Frederico Hamman T. Dimitrius Steedman, MD   Patient's Medications  New Prescriptions   No  medications on file  Previous Medications   CALCIUM-VITAMIN D (OSCAL WITH D) 500-200 MG-UNIT PER TABLET    Take 1 tablet by mouth daily.   CONJUGATED ESTROGENS (PREMARIN) VAGINAL CREAM    INSERT A SMALL AMOUNT USING APPLICATOR  INTRAVAGINALLY 2 TIMES PER WEEK AS      DIRECTED   FISH OIL-OMEGA-3 FATTY ACIDS 1000 MG CAPSULE    Take 1 g by mouth daily.    HYDROXYCHLOROQUINE (PLAQUENIL) 200 MG TABLET    Take 200 mg by mouth daily.   MULTIPLE VITAMINS-MINERALS (CENTRUM SILVER ULTRA WOMENS PO)    Take by mouth.   NAPROXEN (NAPROSYN) 500 MG TABLET    TAKE ONE TABLET BY MOUTH TWICE DAILY    AS NEEDED WITH FOOD   OMEPRAZOLE (PRILOSEC) 20 MG CAPSULE    TAKE 1 CAPSULE BY MOUTH EVERY DAY AS NEEDED.  Modified Medications   Modified Medication Previous Medication   METHOCARBAMOL (ROBAXIN) 500 MG TABLET methocarbamol (ROBAXIN) 500 MG tablet      Take 1 tablet (500 mg total) by mouth every 8 (eight) hours as needed for muscle spasms. For muscle spasm as needed    Take 1 tablet (500 mg total) by mouth every 8 (eight) hours as needed for muscle spasms. For muscle spasm as needed  Discontinued Medications   No medications on file

## 2015-07-31 NOTE — Telephone Encounter (Signed)
Patient notified of the instructions.  She will call me back once she has them in the mail if she has any questions.

## 2015-07-31 NOTE — Patient Instructions (Signed)
Naproxen 500 mg, 1 twice a day for 3 days, then    Switch to OTC Advil, 2 tablets three times a day  Muscle relaxant - 2 - 3 times a day

## 2015-07-31 NOTE — Telephone Encounter (Signed)
Patient is scheduled for 09/06/15 at 9:30.  I will mail her instructions Left message for patient to call back

## 2015-07-31 NOTE — Progress Notes (Signed)
Pre visit review using our clinic review tool, if applicable. No additional management support is needed unless otherwise documented below in the visit note. 

## 2015-08-01 DIAGNOSIS — L820 Inflamed seborrheic keratosis: Secondary | ICD-10-CM | POA: Diagnosis not present

## 2015-08-01 DIAGNOSIS — L821 Other seborrheic keratosis: Secondary | ICD-10-CM | POA: Diagnosis not present

## 2015-08-01 DIAGNOSIS — D1801 Hemangioma of skin and subcutaneous tissue: Secondary | ICD-10-CM | POA: Diagnosis not present

## 2015-08-01 DIAGNOSIS — D863 Sarcoidosis of skin: Secondary | ICD-10-CM | POA: Diagnosis not present

## 2015-08-01 DIAGNOSIS — L578 Other skin changes due to chronic exposure to nonionizing radiation: Secondary | ICD-10-CM | POA: Diagnosis not present

## 2015-08-01 DIAGNOSIS — L538 Other specified erythematous conditions: Secondary | ICD-10-CM | POA: Diagnosis not present

## 2015-08-05 ENCOUNTER — Encounter: Payer: Self-pay | Admitting: Gastroenterology

## 2015-08-05 DIAGNOSIS — M545 Low back pain: Secondary | ICD-10-CM | POA: Diagnosis not present

## 2015-08-09 DIAGNOSIS — M545 Low back pain: Secondary | ICD-10-CM | POA: Diagnosis not present

## 2015-08-14 DIAGNOSIS — M545 Low back pain: Secondary | ICD-10-CM | POA: Diagnosis not present

## 2015-08-16 ENCOUNTER — Other Ambulatory Visit: Payer: Self-pay | Admitting: Family Medicine

## 2015-08-16 DIAGNOSIS — M545 Low back pain: Secondary | ICD-10-CM | POA: Diagnosis not present

## 2015-08-16 NOTE — Telephone Encounter (Signed)
Please ask her what she is taking it for  She gets a rash with mobic-please make sure she is ok with this -thanks  If she is not allergic-can refill times 1

## 2015-08-16 NOTE — Telephone Encounter (Signed)
Pt has not had Rx since 03/2014, pls advise

## 2015-08-19 NOTE — Telephone Encounter (Signed)
Pt is taking Rx for her back issues she is seeing Dr. Lorelei Pont for, pt isn't allergic to Rx and doesn't cause rash, Rx refilled and pt notified

## 2015-08-20 DIAGNOSIS — M545 Low back pain: Secondary | ICD-10-CM | POA: Diagnosis not present

## 2015-08-23 DIAGNOSIS — D863 Sarcoidosis of skin: Secondary | ICD-10-CM | POA: Diagnosis not present

## 2015-08-23 DIAGNOSIS — M545 Low back pain: Secondary | ICD-10-CM | POA: Diagnosis not present

## 2015-08-23 DIAGNOSIS — Z79899 Other long term (current) drug therapy: Secondary | ICD-10-CM | POA: Diagnosis not present

## 2015-08-30 DIAGNOSIS — M545 Low back pain: Secondary | ICD-10-CM | POA: Diagnosis not present

## 2015-09-04 DIAGNOSIS — M545 Low back pain: Secondary | ICD-10-CM | POA: Diagnosis not present

## 2015-09-06 ENCOUNTER — Ambulatory Visit (HOSPITAL_COMMUNITY)
Admission: RE | Admit: 2015-09-06 | Discharge: 2015-09-06 | Disposition: A | Discharge status: Home / Self Care | Payer: Medicare HMO | Source: Ambulatory Visit | Attending: Gastroenterology | Admitting: Gastroenterology

## 2015-09-06 ENCOUNTER — Other Ambulatory Visit: Payer: Self-pay | Admitting: Oncology

## 2015-09-06 DIAGNOSIS — K5790 Diverticulosis of intestine, part unspecified, without perforation or abscess without bleeding: Secondary | ICD-10-CM | POA: Diagnosis not present

## 2015-09-06 DIAGNOSIS — Z1211 Encounter for screening for malignant neoplasm of colon: Secondary | ICD-10-CM

## 2015-09-06 DIAGNOSIS — K5730 Diverticulosis of large intestine without perforation or abscess without bleeding: Secondary | ICD-10-CM | POA: Diagnosis not present

## 2015-09-09 ENCOUNTER — Ambulatory Visit: Payer: Medicare HMO | Admitting: Family Medicine

## 2015-09-11 ENCOUNTER — Encounter: Payer: Self-pay | Admitting: Family Medicine

## 2015-09-11 ENCOUNTER — Ambulatory Visit (INDEPENDENT_AMBULATORY_CARE_PROVIDER_SITE_OTHER): Payer: Medicare HMO | Admitting: Family Medicine

## 2015-09-11 VITALS — BP 102/74 | HR 84 | Temp 98.4°F | Ht 61.0 in | Wt 156.5 lb

## 2015-09-11 DIAGNOSIS — M545 Low back pain, unspecified: Secondary | ICD-10-CM

## 2015-09-11 DIAGNOSIS — M7062 Trochanteric bursitis, left hip: Secondary | ICD-10-CM | POA: Diagnosis not present

## 2015-09-11 DIAGNOSIS — M7061 Trochanteric bursitis, right hip: Secondary | ICD-10-CM

## 2015-09-11 MED ORDER — METHYLPREDNISOLONE ACETATE 40 MG/ML IJ SUSP
80.0000 mg | Freq: Once | INTRAMUSCULAR | Status: AC
  Administered 2015-09-11: 80 mg via INTRA_ARTICULAR

## 2015-09-11 NOTE — Progress Notes (Signed)
Pre visit review using our clinic review tool, if applicable. No additional management support is needed unless otherwise documented below in the visit note. 

## 2015-09-11 NOTE — Progress Notes (Signed)
Dr. Frederico Hamman T. Everli Rother, MD, [facility] [place], [address] Phone: [phone] Fax: [phone]  09/11/2015  Patient: Judith [name], MRN: [id number], DOB: 1945/09/16, 70 y.o.  Primary Physician:  Loura Pardon, MD  Chief Complaint: Follow-up  Subjective:   Judith [name] is a 71 y.o. very pleasant female patient who presents with the following:  Doing better, has not resumed exercised. Prior history is detailed below.  Overall the patient has done quite well, and she is been very diligent with doing her rehabilitation program.  Her strength is gotten much better, and her pain, particularly in her back is much improved.  She still does have some lateral hip pain.  GTB still significant B GTB injections  07/31/2015 Last OV with Owens Loffler, MD F/u GTB and back pain:  GTB is a lot better - but when trying to exercise or shopping in the back and on the back. Never pain free and will get some pain on the left. S/p GTB injection  The patient's primary issue right now is bilateral back pain that is essentially in conferencing her entire lower thoracic spine through the lumbar spine and including the upper cervical region.  She has diffuse muscle spasm on both sides.  Pain most of the time, and this is been an intermittent problem for quite some time, greater than a year, but it is acutely worsened compared to baseline.  No numbness, radiculopathy, and she denies any bowel or bladder incontinence.  06/05/2015 Last OV with Owens Loffler, MD  70 yo patient with back pain and B hip pain: She is intermittently had some trochanteric bursitis off and on for some time, but is been flared up off and on since May. She was on an extensive trip in [place] and did a lot of walking then. She also went to the beach in August and ever since August her pain is not related on either side of her hip. This is limiting her daily  activities and she is limited in her ability to exercise and walk currently. She is not having any numbness, tingling or radiculopathy. She denies groin pain.  b gtb Sometimes will ache when overexercise. May they flared up, and then went to the beach in August and flared up some. Both sides.   On Plaquenil for Sarcoid.  Was seeing Dr. Charlestine Night.   Past Medical History, Surgical History, Social History, Family History, Problem List, Medications, and Allergies have been reviewed and updated if relevant.  Patient Active Problem List   Diagnosis Date Noted  . IBS (irritable colon syndrome) 05/31/2015  . Colon polyps 05/31/2015  . Trochanteric bursitis of both hips 05/31/2015  . Estrogen deficiency 12/05/2014  . Lower abdominal pain 09/26/2014  . Low back pain 09/26/2014  . Acute bronchitis 11/22/2013  . Encounter for Medicare annual wellness exam 09/24/2013  . Enlarged lymph node 03/07/2013  . Sarcoid ([place]) 08/09/2012  . Mixed incontinence urge and stress 03/16/2012  . ALOPECIA 11/14/2010  . GERD 01/24/2009  . NEOPLASM, MALIGNANT, BREAST, HX OF 01/02/2009  . Hyperlipidemia 11/17/2007  . VAGINITIS, ATROPHIC, SYMPTOMATIC 11/17/2007  . Osteopenia 11/17/2007  . TINNITUS 09/07/2007  . ALLERGIC RHINITIS 09/07/2007  . DIVERTICULOSIS, COLON 09/07/2007  . Fatty liver 09/07/2007  . History of diverticulitis 08/12/2007    Past Medical History  Diagnosis Date  . Allergic rhinitis   . Diverticulosis   . History of shingles   . Atrophic vaginitis   .  Osteopenia   . Sliding hiatal hernia   . GERD (gastroesophageal reflux disease)   . Arthritis   . Breast cancer ([place])     Right mastectomy  . IBS (irritable bowel syndrome)   . H/O: pneumonia     as child-viral pneumonia  . HLD (hyperlipidemia)     pt denies  . Allergy     Past Surgical History  Procedure Laterality Date  . Dilation and curettage of uterus  10/1999  . Laparoscopic total hysterectomy  10/03    for fibroids/polyp    . Appendectomy    . Breast lumpectomy    . Mastectomy  7/10    right  . Colonoscopy  02/2010    divertics, rpt 5 years  . Dexa  2005    osteopenia, no change from 2003  . Total abdominal hysterectomy  2003    for fibroids/polyps  . Basal cell carcinoma excision      scalp  . Polypectomy      Social History   Social History  . Marital Status: Married    Spouse Name: N/A  . Number of Children: 2  . Years of Education: N/A   Occupational History  . Retired    Social History Main Topics  . Smoking status: Former Smoker    Quit date: 08/31/1968  . Smokeless tobacco: Never Used  . Alcohol Use: 0.0 oz/week    0 Standard drinks or equivalent per week     Comment: 1-2 glasses of wine/weekly  . Drug Use: No  . Sexual Activity: Not on file   Other Topics Concern  . Not on file   Social History Narrative   Married      2 sons      Retired      2 cups coffee/daily      Gym 3-4 times/week    Family History  Problem Relation Age of Onset  . Prostate cancer Father   . Heart attack Paternal Grandmother   . Prostate cancer Brother   . Lymphoma Brother   . Stomach cancer Maternal Grandfather   . Colon cancer Neg Hx   . Rectal cancer Neg Hx   . Esophageal cancer Neg Hx     Allergies  Allergen Reactions  . Meloxicam     REACTION: rash  . Niacin And Related Other (See Comments)    Caused black and blue marks all over body    Medication list reviewed and updated in full in [place].  GEN: No fevers, chills. Nontoxic. Primarily MSK c/o today. MSK: Detailed in the HPI GI: tolerating PO intake without difficulty Neuro: No numbness, parasthesias, or tingling associated. Otherwise the pertinent positives of the ROS are noted above.   Objective:   BP 102/74 mmHg  Pulse 84  Temp(Src) 98.4 F (36.9 C) (Oral)  Ht 5' 1"  (1.549 m)  Wt 156 lb 8 oz (70.988 kg)  BMI 29.59 kg/m2   GEN: WDWN, NAD, Non-toxic, Alert & Oriented x 3 HEENT: Atraumatic,  Normocephalic.  Ears and Nose: No external deformity. EXTR: No clubbing/cyanosis/edema NEURO: Normal gait.  PSYCH: Normally interactive. Conversant. Not depressed or anxious appearing.  Calm demeanor.   HIP EXAM: SIDE: B ROM: Abduction, Flexion, Internal and External range of motion: FULL Pain with terminal IROM and EROM: none GTB: minimal TTP SLR: NEG Knees: No effusion FABER: NT REVERSE FABER: NT, neg Piriformis: NT at direct palpation Str: flexion: 5/5, but 5/5 on L abduction: 5/5 B Adduction 5/5 B  Limited in her regards to forward flexion and also pain with extension.  She has mild muscle spasm from approximately T10-S1 bilaterally.  She also has tenderness at the SI joints.  She also minimal pain in her upper buttocks region with palpation.  Straight leg raise is negative bilaterally both from a seated and a lying position.  Neurovascularly intact in her lower extremities.  Radiology: *RADIOLOGY REPORT*   Clinical Data: [place] 1 month ago with low back pain   LUMBAR SPINE - COMPLETE 4+ VIEW   Comparison: None.   Findings: There is a 5 mm anterolisthesis of L4 on L5 most likely due to degenerative change.  There is degenerative change at the T11-T12 and T12-L1 level but the intervertebral disc space in the lumbar spine are relatively well preserved.  No compression deformity is seen.  There is degenerative change involving facet joints of L4-5 and L5-S1.  The SI joints appear normally corticated.   IMPRESSION:   1.  5 mm anterolisthesis of L4 on L5 most likely due to degenerative change. 2.  Intervertebral disc spaces in the lumbar spine appear normal with some degenerative change at T11- T12 and T12-L1 levels. 3.  No compression deformity     Original Report Authenticated By: Ivar Drape, M.D.   *RADIOLOGY REPORT*   Clinical Data: [place] 1 month ago with low back and sacral pain   SACRUM AND COCCYX - 2+ VIEW   Comparison: None.   Findings: The sacral  coccygeal elements are in normal alignment. The sacral foramen appear corticated.  The SI joints are corticated.  The pelvic rami are intact.   IMPRESSION: No fracture.     Original Report Authenticated By: Ivar Drape, M.D.  Assessment and Plan:   Trochanteric bursitis of both hips - Plan: methylPREDNISolone acetate (DEPO-MEDROL) injection 80 mg, methylPREDNISolone acetate (DEPO-MEDROL) injection 80 mg  Bilateral low back pain without sciatica  Overall, significantly improved.  She does have some persistent bursitis.  We are going to an try to inject these bilaterally again and have her resume rehabilitation and overall exercise and walking.  Trochanteric Bursitis Injection, R Verbal consent obtained. Risks (including infection, potential atrophy), benefits, and alternatives reviewed. Greater trochanter sterilely prepped with Chloraprep. Ethyl Chloride used for anesthesia. 8 cc of Lidocaine 1% injected with 2 mL of Depo-Medrol 40 mg into trochanteric bursa at area of maximal tenderness at greater trochanter. Needle taken to bone to troch bursa, flows easily. Bursa massaged. No bleeding and no complications. Decreased pain after injection. Needle: 22 gauge spinal needle, 2 1/2 in  Trochanteric Bursitis Injection, L Verbal consent obtained. Risks (including infection, potential atrophy), benefits, and alternatives reviewed. Greater trochanter sterilely prepped with Chloraprep. Ethyl Chloride used for anesthesia. 8 cc of Lidocaine 1% injected with 2 mL of Depo-Medrol 40 mg into trochanteric bursa at area of maximal tenderness at greater trochanter. Needle taken to bone to troch bursa, flows easily. Bursa massaged. No bleeding and no complications. Decreased pain after injection. Needle: 22 gauge spinal needle, 2 1/2 in  Follow-up: prn  Signed,  Hanley Woerner T. Yeng Frankie, MD   Patient's Medications  New Prescriptions   No medications on file  Previous Medications   CALCIUM-VITAMIN D (OSCAL  WITH D) 500-200 MG-UNIT PER TABLET    Take 1 tablet by mouth daily.   CONJUGATED ESTROGENS (PREMARIN) VAGINAL CREAM    INSERT A SMALL AMOUNT USING APPLICATOR  INTRAVAGINALLY 2 TIMES PER WEEK AS      DIRECTED   FISH OIL-OMEGA-3 FATTY ACIDS  1000 MG CAPSULE    Take 1 g by mouth daily.    HYDROXYCHLOROQUINE (PLAQUENIL) 200 MG TABLET    Take 200 mg by mouth daily.   MULTIPLE VITAMINS-MINERALS (CENTRUM SILVER ULTRA WOMENS PO)    Take by mouth.   OMEPRAZOLE (PRILOSEC) 20 MG CAPSULE    TAKE 1 CAPSULE BY MOUTH EVERY DAY AS NEEDED.  Modified Medications   No medications on file  Discontinued Medications   METHOCARBAMOL (ROBAXIN) 500 MG TABLET    Take 1 tablet (500 mg total) by mouth every 8 (eight) hours as needed for muscle spasms. For muscle spasm as needed   NAPROXEN (NAPROSYN) 500 MG TABLET    TAKE ONE TABLET BY MOUTH TWICE DAILY AS NEEDED WITH FOOD

## 2015-11-06 DIAGNOSIS — R918 Other nonspecific abnormal finding of lung field: Secondary | ICD-10-CM | POA: Diagnosis not present

## 2015-11-06 DIAGNOSIS — Z6825 Body mass index (BMI) 25.0-25.9, adult: Secondary | ICD-10-CM | POA: Diagnosis not present

## 2015-11-06 DIAGNOSIS — I10 Essential (primary) hypertension: Secondary | ICD-10-CM | POA: Diagnosis not present

## 2015-11-06 DIAGNOSIS — R938 Abnormal findings on diagnostic imaging of other specified body structures: Secondary | ICD-10-CM | POA: Diagnosis not present

## 2015-11-06 DIAGNOSIS — R942 Abnormal results of pulmonary function studies: Secondary | ICD-10-CM | POA: Diagnosis not present

## 2015-12-10 ENCOUNTER — Encounter: Payer: Self-pay | Admitting: Internal Medicine

## 2015-12-10 ENCOUNTER — Ambulatory Visit (INDEPENDENT_AMBULATORY_CARE_PROVIDER_SITE_OTHER): Payer: Medicare HMO | Admitting: Internal Medicine

## 2015-12-10 VITALS — BP 122/76 | HR 107 | Temp 102.9°F | Wt 154.8 lb

## 2015-12-10 DIAGNOSIS — J101 Influenza due to other identified influenza virus with other respiratory manifestations: Secondary | ICD-10-CM | POA: Diagnosis not present

## 2015-12-10 LAB — POC INFLUENZA A&B (BINAX/QUICKVUE)
INFLUENZA A, POC: NEGATIVE
INFLUENZA B, POC: POSITIVE — AB

## 2015-12-10 MED ORDER — OSELTAMIVIR PHOSPHATE 75 MG PO CAPS: 75.0000 mg | ORAL_CAPSULE | Freq: Two times a day (BID) | ORAL | Status: DC

## 2015-12-10 NOTE — Progress Notes (Signed)
HPI  Pt presents to the clinic today with c/o fever, chills, body aches, headache, fatigue, and sore throat. This started 2 days ago. She has had some associated nausea today, but no other GI upset. She has run fevers up to 102.9. She has taken Tylenol with minimal relief. Shewas travelling last week, and 2 of her companions were sick (one with URI sxs and the other with GI sxs).  Pt received the flu shot 07/30/2105.   Review of Systems      Past Medical History  Diagnosis Date  . Allergic rhinitis   . Diverticulosis   . History of shingles   . Atrophic vaginitis   . Osteopenia   . Sliding hiatal hernia   . GERD (gastroesophageal reflux disease)   . Arthritis   . Breast cancer ([facility] [name])     Right mastectomy  . IBS (irritable bowel syndrome)   . H/O: pneumonia     as child-viral pneumonia  . HLD (hyperlipidemia)     pt denies  . Allergy     Family History  Problem Relation Age of Onset  . Prostate cancer Father   . Heart attack Paternal Grandmother   . Prostate cancer Brother   . Lymphoma Brother   . Stomach cancer Maternal Grandfather   . Colon cancer Neg Hx   . Rectal cancer Neg Hx   . Esophageal cancer Neg Hx     Social History   Social History  . Marital Status: Married    Spouse Name: N/A  . Number of Children: 2  . Years of Education: N/A   Occupational History  . Retired    Social History Main Topics  . Smoking status: Former Smoker    Quit date: 08/31/1968  . Smokeless tobacco: Never Used  . Alcohol Use: 0.0 oz/week    0 Standard drinks or equivalent per week     Comment: 1-2 glasses of wine/weekly  . Drug Use: No  . Sexual Activity: Not on file   Other Topics Concern  . Not on file   Social History Narrative   Married      2 sons      Retired      2 cups coffee/daily      Gym 3-4 times/week    Allergies  Allergen Reactions  . Meloxicam     REACTION: rash  . Niacin And Related Other (See Comments)    Caused black and blue marks all  over body     Constitutional: Positive headache, fatigue and fever. Denies abrupt weight changes.  HEENT:  Positive sore throat. Denies eye redness, eye pain, pressure behind the eyes, facial pain, nasal congestion, ear pain, ringing in the ears, wax buildup, runny nose or bloody nose. Respiratory: Denies cough, difficulty breathing or shortness of breath.  Cardiovascular: Denies chest pain, chest tightness, palpitations or swelling in the hands or feet. GI: Positive for some nausea   No other specific complaints in a complete review of systems (except as listed in HPI above).  Objective:   BP 122/76 mmHg  Pulse 107  Temp(Src) 102.9 F (39.4 C) (Oral)  Wt 154 lb 12 oz (70.194 kg)  SpO2 94% Wt Readings from Last 3 Encounters:  12/10/15 154 lb 12 oz (70.194 kg)  09/11/15 156 lb 8 oz (70.988 kg)  07/31/15 154 lb 12 oz (70.194 kg)     General: Appears her stated age, ill appearing in NAD. HEENT: Head: normal shape and size; Eyes: sclera white, no icterus,  conjunctiva pink; Ears: Tm's gray and intact, normal light reflex; Nose: mucosa pink and moist, erythematous and inflamed, septum midline; Throat/Mouth: Teeth present, mucosa erythematous and moist, no exudate noted, no lesions or ulcerations noted.  Neck: Cervical lymphadenopathy.  Cardiovascular: Tachycardic rhythm. S1,S2 noted.  No murmur, rubs or gallops noted.  Pulmonary/Chest: Normal effort and positive vesicular breath sounds. No respiratory distress. No wheezes, rales or ronchi noted.      Assessment & Plan:   Influenza B:  Rapid Flu test: postive Get some rest and drink plenty of water eRx forTamiflu x 5 days Continue Tylenol PRN for fever and body aches Robitussin as needed for cough  RTC as needed or if symptoms persist.

## 2015-12-10 NOTE — Progress Notes (Signed)
Pre visit review using our clinic review tool, if applicable. No additional management support is needed unless otherwise documented below in the visit note. 

## 2015-12-10 NOTE — Patient Instructions (Signed)

## 2015-12-12 ENCOUNTER — Ambulatory Visit (INDEPENDENT_AMBULATORY_CARE_PROVIDER_SITE_OTHER): Payer: Medicare HMO

## 2015-12-12 VITALS — BP 102/72 | HR 97 | Temp 100.6°F | Ht 61.0 in | Wt 154.5 lb

## 2015-12-12 DIAGNOSIS — Z0000 Encounter for general adult medical examination without abnormal findings: Secondary | ICD-10-CM

## 2015-12-12 DIAGNOSIS — E559 Vitamin D deficiency, unspecified: Secondary | ICD-10-CM

## 2015-12-12 DIAGNOSIS — E785 Hyperlipidemia, unspecified: Secondary | ICD-10-CM | POA: Diagnosis not present

## 2015-12-12 LAB — LIPID PANEL
Cholesterol: 141 mg/dL (ref 0–200)
HDL: 52.3 mg/dL (ref >39.00)
LDL Cholesterol: 77 mg/dL (ref 0–99)
NONHDL: 88.77
Total CHOL/HDL Ratio: 3
Triglycerides: 60 mg/dL (ref 0.0–149.0)
VLDL: 12 mg/dL (ref 0.0–40.0)

## 2015-12-12 LAB — CBC WITH DIFFERENTIAL/PLATELET
BASOS PCT: 0.3 % (ref 0.0–3.0)
Basophils Absolute: 0 10*3/uL (ref 0.0–0.1)
EOS PCT: 0.3 % (ref 0.0–5.0)
Eosinophils Absolute: 0 10*3/uL (ref 0.0–0.7)
HCT: 42.4 % (ref 36.0–46.0)
HEMOGLOBIN: 14 g/dL (ref 12.0–15.0)
LYMPHS ABS: 1.6 10*3/uL (ref 0.7–4.0)
Lymphocytes Relative: 14.3 % (ref 12.0–46.0)
MCHC: 33 g/dL (ref 30.0–36.0)
MCV: 91.8 fl (ref 78.0–100.0)
MONO ABS: 0.8 10*3/uL (ref 0.1–1.0)
Monocytes Relative: 7.3 % (ref 3.0–12.0)
NEUTROS ABS: 8.8 10*3/uL — AB (ref 1.4–7.7)
NEUTROS PCT: 77.8 % — AB (ref 43.0–77.0)
PLATELETS: 271 10*3/uL (ref 150.0–400.0)
RBC: 4.62 Mil/uL (ref 3.87–5.11)
RDW: 14.5 % (ref 11.5–15.5)
WBC: 11.3 10*3/uL — ABNORMAL HIGH (ref 4.0–10.5)

## 2015-12-12 LAB — COMPREHENSIVE METABOLIC PANEL
ALT: 18 U/L (ref 0–35)
AST: 19 U/L (ref 0–37)
Albumin: 4 g/dL (ref 3.5–5.2)
Alkaline Phosphatase: 57 U/L (ref 39–117)
BUN: 10 mg/dL (ref 6–23)
CHLORIDE: 101 meq/L (ref 96–112)
CO2: 29 meq/L (ref 19–32)
Calcium: 8.9 mg/dL (ref 8.4–10.5)
Creatinine, Ser: 0.81 mg/dL (ref 0.40–1.20)
GFR: 74.12 mL/min (ref >60.00)
GLUCOSE: 99 mg/dL (ref 70–99)
POTASSIUM: 3.8 meq/L (ref 3.5–5.1)
SODIUM: 138 meq/L (ref 135–145)
Total Bilirubin: 0.3 mg/dL (ref 0.2–1.2)
Total Protein: 7.1 g/dL (ref 6.0–8.3)

## 2015-12-12 LAB — VITAMIN D 25 HYDROXY (VIT D DEFICIENCY, FRACTURES): VITD: 35.53 ng/mL (ref 30.00–100.00)

## 2015-12-12 LAB — TSH: TSH: 2.2 u[IU]/mL (ref 0.35–4.50)

## 2015-12-12 NOTE — Progress Notes (Signed)
Subjective:   Judith [name] is a 70 y.o. female who presents for Medicare Annual (Subsequent) preventive examination.  Cardiac Risk Factors include: advanced age (>25mn, >>94women);dyslipidemia     Objective:     Vitals: BP 102/72 mmHg  Pulse 97  Temp(Src) 100.6 F (38.1 C) (Oral)  Ht 5' 1"  (1.549 m)  Wt 154 lb 8 oz (70.081 kg)  BMI 29.21 kg/m2  SpO2 93%  Body mass index is 29.21 kg/(m^2).   Tobacco History  Smoking status  . Former Smoker  . Quit date: 08/31/1968  Smokeless tobacco  . Never Used     Counseling given: No   Past Medical History  Diagnosis Date  . Allergic rhinitis   . Diverticulosis   . History of shingles   . Atrophic vaginitis   . Osteopenia   . Sliding hiatal hernia   . GERD (gastroesophageal reflux disease)   . Arthritis   . [place] cancer [place]     Right mastectomy  . IBS (irritable bowel syndrome)   . H/O: pneumonia     as child-viral pneumonia  . HLD (hyperlipidemia)     pt denies  . Allergy    Past Surgical History  Procedure Laterality Date  . Dilation and curettage of uterus  10/1999  . Laparoscopic total hysterectomy  10/03    for fibroids/polyp  . Appendectomy    . Breast lumpectomy    . Mastectomy  7/10    right  . Colonoscopy  02/2010    divertics, rpt 5 years  . Dexa  2005    osteopenia, no change from 2003  . Total abdominal hysterectomy  2003    for fibroids/polyps  . Basal cell carcinoma excision      scalp  . Polypectomy     Family History  Problem Relation Age of Onset  . Prostate cancer Father   . Heart attack Paternal Grandmother   . Prostate cancer Brother   . Lymphoma Brother   . Stomach cancer Maternal Grandfather   . Colon cancer Neg Hx   . Rectal cancer Neg Hx   . Esophageal cancer Neg Hx    History  Sexual Activity  . Sexual Activity: Yes    Outpatient Encounter Prescriptions as of 12/12/2015  Medication Sig  . calcium-vitamin D (OSCAL WITH D) 500-200 MG-UNIT per tablet Take 1  tablet by mouth daily.  .Marland Kitchenconjugated estrogens (PREMARIN) vaginal cream INSERT A SMALL AMOUNT USING APPLICATOR  INTRAVAGINALLY 2 TIMES PER WEEK AS      DIRECTED  . fish oil-omega-3 fatty acids 1000 MG capsule Take 1 g by mouth daily.   . hydroxychloroquine (PLAQUENIL) 200 MG tablet Take 200 mg by mouth daily.  . Multiple Vitamins-Minerals (CENTRUM SILVER ULTRA WOMENS PO) Take by mouth.  .Marland Kitchenomeprazole (PRILOSEC) 20 MG capsule TAKE 1 CAPSULE BY MOUTH EVERY DAY AS NEEDED.  .Marland Kitchenoseltamivir (TAMIFLU) 75 MG capsule Take 1 capsule (75 mg total) by mouth 2 (two) times daily.   No facility-administered encounter medications on file as of 12/12/2015.    Activities of Daily Living In your present state of health, do you have any difficulty performing the following activities: 12/12/2015  Hearing? N  Vision? N  Difficulty concentrating or making decisions? N  Walking or climbing stairs? N  Dressing or bathing? N  Doing errands, shopping? N  Preparing Food and eating ? N  Using the Toilet? N  In the past six months, have you accidently leaked urine? YDarreld Mclean  Do you have problems with loss of bowel control? N  Managing your Medications? N  Managing your Finances? N  Housekeeping or managing your Housekeeping? N    Patient Care Team: Abner Greenspan, MD as PCP - General Oneta Rack, MD as Consulting Physician (Dermatology)    Assessment:    Vision Screening Comments: Last eye exam in Jan 2016  Exercise Activities and Dietary recommendations Current Exercise Habits: Structured exercise class, Type of exercise: walking;Other - see comments (Silver Sneakers), Time (Minutes): 30, Frequency (Times/Week): 4, Weekly Exercise (Minutes/Week): 120, Intensity: Mild, Exercise limited by: None identified  Goals    . Increase physical activity     Starting 12/12/2015, I will continue to do Silver Sneakers for 30 min at least 3-4 days per week.       Fall Risk Fall Risk  12/12/2015 12/05/2014 10/02/2013  Falls in  the past year? No No Yes  Number falls in past yr: - - 1  Injury with Fall? - - No   Depression Screen PHQ 2/9 Scores 12/12/2015 12/05/2014 10/02/2013  PHQ - 2 Score 0 0 0     Cognitive Testing MMSE - Mini Mental State Exam 12/12/2015  Orientation to time 5  Orientation to Place 5  Registration 3  Attention/ Calculation 0  Recall 3  Language- name 2 objects 0  Language- repeat 1  Language- follow 3 step command 3  Language- read & follow direction 0  Write a sentence 0  Copy design 0  Total score 20   PLEASE NOTE: A Mini-Cog screen was completed. Maximum score is 20. A value of 0 denotes this part of [place] MMSE was not completed.  Orientation to Time - Max 5 Orientation to Place - Max 5 Registration - Max 3 Recall - Max 3 Language Repeat - Max 1 Language Follow 3 Step Command - Max 3  Immunization History  Administered Date(s) Administered  . Influenza Split 06/24/2011, 06/08/2012  . Influenza Whole 07/08/2007, 06/11/2008, 06/19/2009, 06/04/2010  . Influenza,inj,Quad PF,36+ Mos 06/15/2013, 06/21/2014, 07/31/2015  . Pneumococcal Conjugate-13 12/05/2014  . Pneumococcal Polysaccharide-23 03/20/2011  . Td 06/06/2001  . Tdap 03/20/2011   Screening Tests Health Maintenance  Topic Date Due  . ZOSTAVAX  12/11/2016 (Originally 02/20/2005)  . Hepatitis C Screening  10/02/2020 (Originally Nov 18, 1945)  . MAMMOGRAM  02/29/2016  . INFLUENZA VACCINE  03/31/2016  . TETANUS/TDAP  03/19/2021  . COLONOSCOPY  07/28/2025  . DEXA SCAN  Completed  . PNA vac Low Risk Adult  Completed      Plan:     I have personally reviewed and addressed the Medicare Annual Wellness questionnaire and have noted the following in the patient's chart:  A. Medical and social history B. Use of alcohol, tobacco or illicit drugs  C. Current medications and supplements D. Functional ability and status E.  Nutritional status F.  Physical activity G. Advance directives H. List of other physicians I.    Hospitalizations, surgeries, and ER visits in previous 12 months J.  [place] to include hearing, vision, cognitive, depression L. Referrals and appointments - none  In addition, I have reviewed and discussed with patient certain preventive protocols, quality metrics, and best practice recommendations. A written personalized care plan for preventive services as well as general preventive health recommendations were provided to patient.  See attached scanned questionnaire for additional information.   Signed,   Lindell Noe, MHA, BS, LPN Health Advisor 9/47/0962

## 2015-12-12 NOTE — Progress Notes (Signed)
   Subjective:    Patient ID: Judith [name], female    DOB: [birth year], 70 y.o.   MRN: [id number]  HPI    Review of Systems     Objective:   Physical Exam        Assessment & Plan:  I reviewed health advisor's note, was available for consultation, and agree with documentation and plan.

## 2015-12-12 NOTE — Progress Notes (Signed)
Nurse concerns:   Patient's oral temperature was 100.6 degrees Farenheit. Patient was recently seen by another provider on 12/10/15 for an acute visit. During this encounter, patient tested positive for flu and is now taking Tamiflu. Dr. Danise Mina was consulting during AWV regarding patient's temperature. Per Dr. Danise Mina, patient was advised to drink fluids and to rest in bed as much as possible. Patient was encouraged to seek medical treatment during non-operational hours at nearest urgent care or emergency room or schedule an acute visit on 12/16/15 at [facility] if flu symptoms worsen.

## 2015-12-12 NOTE — Progress Notes (Signed)
I reviewed health advisor's note, was available for consultation, and agree with documentation and plan.  

## 2015-12-12 NOTE — Progress Notes (Signed)
Pre visit review using our clinic review tool, if applicable. No additional management support is needed unless otherwise documented below in the visit note. 

## 2015-12-12 NOTE — Patient Instructions (Signed)
Judith [name] , Thank you for taking time to come for your Medicare Wellness Visit. I appreciate your ongoing commitment to your health goals. Please review the following plan we discussed and let me know if I can assist you in the future.   These are the goals we discussed: Goals    . Increase physical activity     Starting 12/12/2015, I will continue to do Silver Sneakers for 30 min at least 3-4 days per week.        This is a list of the screening recommended for you and due dates:  Health Maintenance  Topic Date Due  . Shingles Vaccine  12/11/2016*  .  Hepatitis C: One time screening is recommended by Center for Disease Control  (CDC) for  adults born from [birth year] through 1966.   10/02/2020*  . Mammogram  02/29/2016  . Flu Shot  03/31/2016  . Tetanus Vaccine  03/19/2021  . Colon Cancer Screening  07/28/2025  . DEXA scan (bone density measurement)  Completed  . Pneumonia vaccines  Completed  *Topic was postponed. The date shown is not the original due date.   Preventive Care for Adults  A healthy lifestyle and preventive care can promote health and wellness. Preventive health guidelines for adults include the following key practices.  . A routine yearly physical is a good way to check with your health care provider about your health and preventive screening. It is a chance to share any concerns and updates on your health and to receive a thorough exam.  . Visit your dentist for a routine exam and preventive care every 6 months. Brush your teeth twice a day and floss once a day. Good oral hygiene prevents tooth decay and gum disease.  . The frequency of eye exams is based on your age, health, family medical history, use  of contact lenses, and other factors. Follow your health care provider's ecommendations for frequency of eye exams.  . Eat a healthy diet. Foods like vegetables, fruits, whole grains, low-fat dairy products, and lean protein foods contain the nutrients you need  without too many calories. Decrease your intake of foods high in solid fats, added sugars, and salt. Eat the right amount of calories for you. Get information about a proper diet from your health care provider, if necessary.  . Regular physical exercise is one of the most important things you can do for your health. Most adults should get at least 150 minutes of moderate-intensity exercise (any activity that increases your heart rate and causes you to sweat) each week. In addition, most adults need muscle-strengthening exercises on 2 or more days a week.  Silver Sneakers may be a benefit available to you. To determine eligibility, you may visit the website: www.silversneakers.com or contact program at 6676191223 Mon-Fri between 8AM-8PM.   . Maintain a healthy weight. The body mass index (BMI) is a screening tool to identify possible weight problems. It provides an estimate of body fat based on height and weight. Your health care provider can find your BMI and can help you achieve or maintain a healthy weight.   For adults 20 years and older: ? A BMI below 18.5 is considered underweight. ? A BMI of 18.5 to 24.9 is normal. ? A BMI of 25 to 29.9 is considered overweight. ? A BMI of 30 and above is considered obese.   . Maintain normal blood lipids and cholesterol levels by exercising and minimizing your intake of saturated fat. Eat a balanced diet  with plenty of fruit and vegetables. Blood tests for lipids and cholesterol should begin at age 49 and be repeated every 5 years. If your lipid or cholesterol levels are high, you are over 50, or you are at high risk for heart disease, you may need your cholesterol levels checked more frequently. Ongoing high lipid and cholesterol levels should be treated with medicines if diet and exercise are not working.  . If you smoke, find out from your health care provider how to quit. If you do not use tobacco, please do not start.  . If you choose to drink  alcohol, please do not consume more than 2 drinks per day. One drink is considered to be 12 ounces (355 mL) of beer, 5 ounces (148 mL) of wine, or 1.5 ounces (44 mL) of liquor.  . If you are 8-60 years old, ask your health care provider if you should take aspirin to prevent strokes.  . Use sunscreen. Apply sunscreen liberally and repeatedly throughout the day. You should seek shade when your shadow is shorter than you. Protect yourself by wearing long sleeves, pants, a wide-brimmed hat, and sunglasses year round, whenever you are outdoors.  . Once a month, do a whole body skin exam, using a mirror to look at the skin on your back. Tell your health care provider of new moles, moles that have irregular borders, moles that are larger than a pencil eraser, or moles that have changed in shape or color.

## 2015-12-16 ENCOUNTER — Encounter: Payer: Self-pay | Admitting: Primary Care

## 2015-12-16 ENCOUNTER — Ambulatory Visit (INDEPENDENT_AMBULATORY_CARE_PROVIDER_SITE_OTHER): Payer: Medicare HMO | Admitting: Primary Care

## 2015-12-16 VITALS — BP 110/76 | HR 75 | Temp 98.5°F | Ht 61.0 in | Wt 153.4 lb

## 2015-12-16 DIAGNOSIS — H109 Unspecified conjunctivitis: Secondary | ICD-10-CM | POA: Diagnosis not present

## 2015-12-16 MED ORDER — POLYMYXIN B-TRIMETHOPRIM 10000-0.1 UNIT/ML-% OP SOLN: OPHTHALMIC | Status: DC

## 2015-12-16 NOTE — Progress Notes (Signed)
Pre visit review using our clinic review tool, if applicable. No additional management support is needed unless otherwise documented below in the visit note. 

## 2015-12-16 NOTE — Progress Notes (Signed)
Subjective:    Patient ID: Judith [name], female    DOB: 07/05/1946, 71 y.o.   MRN: [id number]  HPI  Judith [name] is a 70 year old female who presents today with a chief complaint of conjunctivitis. Her symptoms have been present since Friday last week. She was diagnosed last Tuesday with the flu. Her symptoms include itching, watering, redness, yellow/green mucous discharge, and eye matting shut overnight. She's been using saline eye drops OTC without improvement. She had a low grade fever yesterday. She's started to notice these same symptoms to her right eye today.   Review of Systems  Constitutional: Positive for fever.  HENT: Positive for congestion. Negative for rhinorrhea, sneezing and sore throat.   Eyes: Positive for discharge, redness and itching.  Respiratory: Positive for cough.        Past Medical History  Diagnosis Date  . Allergic rhinitis   . Diverticulosis   . History of shingles   . Atrophic vaginitis   . Osteopenia   . Sliding hiatal hernia   . GERD (gastroesophageal reflux disease)   . Arthritis   . Breast cancer ([place])     Right mastectomy  . IBS (irritable bowel syndrome)   . H/O: pneumonia     as child-viral pneumonia  . HLD (hyperlipidemia)     pt denies  . Allergy      Social History   Social History  . Marital Status: Married    Spouse Name: N/A  . Number of Children: 2  . Years of Education: N/A   Occupational History  . Retired    Social History Main Topics  . Smoking status: Former Smoker    Quit date: 08/31/1968  . Smokeless tobacco: Never Used  . Alcohol Use: 0.0 oz/week    0 Standard drinks or equivalent per week     Comment: 1-2 glasses of wine/weekly  . Drug Use: No  . Sexual Activity: Yes   Other Topics Concern  . Not on file   Social History Narrative   Married      2 sons      Retired      2 cups coffee/daily      Gym 3-4 times/week    Past Surgical History  Procedure Laterality Date  . Dilation and  curettage of uterus  10/1999  . Laparoscopic total hysterectomy  10/03    for fibroids/polyp  . Appendectomy    . Breast lumpectomy    . Mastectomy  7/10    right  . Colonoscopy  02/2010    divertics, rpt 5 years  . Dexa  2005    osteopenia, no change from 2003  . Total abdominal hysterectomy  2003    for fibroids/polyps  . Basal cell carcinoma excision      scalp  . Polypectomy      Family History  Problem Relation Age of Onset  . Prostate cancer Father   . Heart attack Paternal Grandmother   . Prostate cancer Brother   . Lymphoma Brother   . Stomach cancer Maternal Grandfather   . Colon cancer Neg Hx   . Rectal cancer Neg Hx   . Esophageal cancer Neg Hx     Allergies  Allergen Reactions  . Meloxicam     REACTION: rash  . Niacin And Related Other (See Comments)    Caused black and blue marks all over body    Current Outpatient Prescriptions on File Prior to Visit  Medication Sig Dispense  Refill  . calcium-vitamin D (OSCAL WITH D) 500-200 MG-UNIT per tablet Take 1 tablet by mouth daily.    Marland Kitchen conjugated estrogens (PREMARIN) vaginal cream INSERT A SMALL AMOUNT USING APPLICATOR  INTRAVAGINALLY 2 TIMES PER WEEK AS      DIRECTED 127.5 g 5  . fish oil-omega-3 fatty acids 1000 MG capsule Take 1 g by mouth daily.     . Multiple Vitamins-Minerals (CENTRUM SILVER ULTRA WOMENS PO) Take by mouth.    Marland Kitchen omeprazole (PRILOSEC) 20 MG capsule TAKE 1 CAPSULE BY MOUTH EVERY DAY AS NEEDED. 30 capsule 11  . hydroxychloroquine (PLAQUENIL) 200 MG tablet Take 200 mg by mouth daily. Reported on 12/16/2015     No current facility-administered medications on file prior to visit.    BP 110/76 mmHg  Pulse 75  Temp(Src) 98.5 F (36.9 C) (Oral)  Ht 5' 1"  (1.549 m)  Wt 153 lb 6.4 oz (69.582 kg)  BMI 29.00 kg/m2  SpO2 96%    Objective:   Physical Exam  Constitutional: She appears well-nourished.  HENT:  Right Ear: Tympanic membrane and ear canal normal.  Left Ear: Tympanic membrane and  ear canal normal.  Nose: Right sinus exhibits no maxillary sinus tenderness and no frontal sinus tenderness. Left sinus exhibits no maxillary sinus tenderness and no frontal sinus tenderness.  Mouth/Throat: Oropharynx is clear and moist.  Eyes: Right eye exhibits no discharge. Left eye exhibits discharge. Right conjunctiva is injected. Left conjunctiva is injected.  Neck: Neck supple.  Cardiovascular: Normal rate and regular rhythm.   Pulmonary/Chest: Effort normal and breath sounds normal. She has no wheezes. She has no rales.  Lymphadenopathy:    She has no cervical adenopathy.  Skin: Skin is warm and dry.          Assessment & Plan:  Conjunctivitis:  Symptoms present since Friday last week. Exam today with mild light green discharge from left inner canthus, injection to conjunctiva. Appears to be bacterial involvement. RX for Trimethoprim Polymixin B gtts daily to both eyes for 5-7 days. Also discussed antihistamine use for other symptoms. Return precautions provided.

## 2015-12-16 NOTE — Patient Instructions (Signed)
Start eye drops for eye infection. Instill 1 drop into each eye four times daily for 5-7 days. Continue treatment for 3 additional days once symptoms resolve.  Ensure you practice good hand hygiene to prevent spread.  It was a pleasure meeting you!  Bacterial Conjunctivitis Bacterial conjunctivitis, commonly called pink eye, is an inflammation of the clear membrane that covers the white part of the eye (conjunctiva). The inflammation can also happen on the underside of the eyelids. The blood vessels in the conjunctiva become inflamed, causing the eye to become red or pink. Bacterial conjunctivitis may spread easily from one eye to another and from person to person (contagious).  CAUSES  Bacterial conjunctivitis is caused by bacteria. The bacteria may come from your own skin, your upper respiratory tract, or from someone else with bacterial conjunctivitis. SYMPTOMS  The normally white color of the eye or the underside of the eyelid is usually pink or red. The pink eye is usually associated with irritation, tearing, and some sensitivity to light. Bacterial conjunctivitis is often associated with a thick, yellowish discharge from the eye. The discharge may turn into a crust on the eyelids overnight, which causes your eyelids to stick together. If a discharge is present, there may also be some blurred vision in the affected eye. DIAGNOSIS  Bacterial conjunctivitis is diagnosed by your caregiver through an eye exam and the symptoms that you report. Your caregiver looks for changes in the surface tissues of your eyes, which may point to the specific type of conjunctivitis. A sample of any discharge may be collected on a cotton-tip swab if you have a severe case of conjunctivitis, if your cornea is affected, or if you keep getting repeat infections that do not respond to treatment. The sample will be sent to a lab to see if the inflammation is caused by a bacterial infection and to see if the infection will  respond to antibiotic medicines. TREATMENT   Bacterial conjunctivitis is treated with antibiotics. Antibiotic eyedrops are most often used. However, antibiotic ointments are also available. Antibiotics pills are sometimes used. Artificial tears or eye washes may ease discomfort. HOME CARE INSTRUCTIONS   To ease discomfort, apply a cool, clean washcloth to your eye for 10-20 minutes, 3-4 times a day.  Gently wipe away any drainage from your eye with a warm, wet washcloth or a cotton ball.  Wash your hands often with soap and water. Use paper towels to dry your hands.  Do not share towels or washcloths. This may spread the infection.  Change or wash your pillowcase every day.  You should not use eye makeup until the infection is gone.  Do not operate machinery or drive if your vision is blurred.  Stop using contact lenses. Ask your caregiver how to sterilize or replace your contacts before using them again. This depends on the type of contact lenses that you use.  When applying medicine to the infected eye, do not touch the edge of your eyelid with the eyedrop bottle or ointment tube. SEEK IMMEDIATE MEDICAL CARE IF:   Your infection has not improved within 3 days after beginning treatment.  You had yellow discharge from your eye and it returns.  You have increased eye pain.  Your eye redness is spreading.  Your vision becomes blurred.  You have a fever or persistent symptoms for more than 2-3 days.  You have a fever and your symptoms suddenly get worse.  You have facial pain, redness, or swelling. MAKE SURE YOU:  Understand these instructions.  Will watch your condition.  Will get help right away if you are not doing well or get worse.   This information is not intended to replace advice given to you by your health care provider. Make sure you discuss any questions you have with your health care provider.   Document Released: 08/17/2005 Document Revised: 09/07/2014  Document Reviewed: 01/18/2012 Elsevier Interactive Patient Education [employer].

## 2015-12-23 ENCOUNTER — Other Ambulatory Visit: Payer: Self-pay | Admitting: Family Medicine

## 2016-01-06 ENCOUNTER — Encounter: Payer: Self-pay | Admitting: Family Medicine

## 2016-01-06 ENCOUNTER — Ambulatory Visit (INDEPENDENT_AMBULATORY_CARE_PROVIDER_SITE_OTHER): Payer: Medicare HMO | Admitting: Family Medicine

## 2016-01-06 VITALS — BP 122/76 | HR 86 | Temp 98.1°F | Ht 61.0 in | Wt 155.8 lb

## 2016-01-06 DIAGNOSIS — Z0000 Encounter for general adult medical examination without abnormal findings: Secondary | ICD-10-CM | POA: Diagnosis not present

## 2016-01-06 DIAGNOSIS — Z853 Personal history of malignant neoplasm of breast: Secondary | ICD-10-CM

## 2016-01-06 DIAGNOSIS — K635 Polyp of colon: Secondary | ICD-10-CM | POA: Diagnosis not present

## 2016-01-06 DIAGNOSIS — E2839 Other primary ovarian failure: Secondary | ICD-10-CM

## 2016-01-06 DIAGNOSIS — M8580 Other specified disorders of bone density and structure, unspecified site: Secondary | ICD-10-CM | POA: Diagnosis not present

## 2016-01-06 DIAGNOSIS — D869 Sarcoidosis, unspecified: Secondary | ICD-10-CM | POA: Diagnosis not present

## 2016-01-06 MED ORDER — OMEPRAZOLE 20 MG PO CPDR: DELAYED_RELEASE_CAPSULE | ORAL | Status: DC

## 2016-01-06 MED ORDER — ESTROGENS, CONJUGATED 0.625 MG/GM VA CREA: TOPICAL_CREAM | VAGINAL | Status: DC

## 2016-01-06 NOTE — Progress Notes (Signed)
Pre visit review using our clinic review tool, if applicable. No additional management support is needed unless otherwise documented below in the visit note. 

## 2016-01-06 NOTE — Assessment & Plan Note (Signed)
Pt will get BE in 5 years due to tortuous colon (cannot have a colonoscopy)

## 2016-01-06 NOTE — Assessment & Plan Note (Signed)
Last dexa in low normal range One fall / no fx On ca and D  Urged compliance with D - level is low normal Scheduled dexa  Disc need for calcium/ vitamin D/ wt bearing exercise and bone density test every 2 y to monitor Disc safety/ fracture risk in detail

## 2016-01-06 NOTE — Assessment & Plan Note (Signed)
Off plaquenil now due to side eff /? Ineffective Will f/u with derm

## 2016-01-06 NOTE — Progress Notes (Signed)
Subjective:    Patient ID: Judith [name], female    DOB: [birth year], 70 y.o.   MRN: [id number]  HPI Here for health maintenance exam and to review chronic medical problems    Doing well  [place]ing [place] - [place] - good trip and then she got the flu    Had AMW visit with Katha Cabal in April (she was also sick that day)-otherwise unremarkable Still gets a flu shot every year   Wt is up 2 lb with bmi of 29 Taking care of herself - likes to walk for exercise / silver sneakers also  Eating a healthy diet and making good choices   Still having issues with bursitis on the R  Had 2 shots  Will f/u with Dr Lorelei Pont and do her exercises   Zoster vaccine-is interested  She stopped taking plaquenil -side eff and was 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   Lab Results  Component Value Date   WBC 11.3* 12/12/2015   HGB 14.0 12/12/2015   HCT 42.4 12/12/2015   MCV 91.8 12/12/2015   PLT 271.0 12/12/2015   was sick at that time   Results for orders placed or performed in visit on 12/12/15  TSH  Result Value Ref Range   TSH 2.20 0.35 - 4.50 uIU/mL  Comprehensive metabolic panel  Result Value Ref Range   Sodium 138 135 - 145 mEq/L   Potassium 3.8 3.5 - 5.1 mEq/L   Chloride 101 96 - 112 mEq/L   CO2 29 19 - 32 mEq/L   Glucose, Bld 99 70 - 99 mg/dL   BUN 10 6 - 23 mg/dL   Creatinine, Ser 0.81 0.40 - 1.20 mg/dL   Total Bilirubin  0.3 0.2 - 1.2 mg/dL   Alkaline Phosphatase 57 39 - 117 U/L   AST 19 0 - 37 U/L   ALT 18 0 - 35 U/L   Total Protein 7.1 6.0 - 8.3 g/dL   Albumin 4.0 3.5 - 5.2 g/dL   Calcium 8.9 8.4 - 10.5 mg/dL   GFR 74.12 >60.00 mL/min  CBC with Differential  Result Value Ref Range   WBC 11.3 (H) 4.0 - 10.5 K/uL   RBC 4.62 3.87 - 5.11 Mil/uL   Hemoglobin 14.0 12.0 - 15.0 g/dL   HCT 42.4 36.0 - 46.0 %   MCV 91.8 78.0 - 100.0 fl   MCHC 33.0 30.0 - 36.0 g/dL   RDW 14.5 11.5 - 15.5 %   Platelets 271.0 150.0 - 400.0 K/uL   Neutrophils Relative % 77.8 (H) 43.0 - 77.0 %   Lymphocytes Relative 14.3 12.0 - 46.0 %   Monocytes Relative 7.3 3.0 - 12.0 %   Eosinophils Relative 0.3 0.0 - 5.0 %   Basophils Relative 0.3 0.0 - 3.0 %   Neutro  Abs 8.8 (H) 1.4 - 7.7 K/uL   Lymphs Abs 1.6 0.7 - 4.0 K/uL   Monocytes Absolute 0.8 0.1 - 1.0 K/uL   Eosinophils Absolute 0.0 0.0 - 0.7 K/uL   Basophils Absolute 0.0 0.0 - 0.1 K/uL  Vitamin D, 25-hydroxy  Result Value Ref Range   VITD 35.53 30.00 - 100.00 ng/mL  Lipid Panel  Result Value Ref Range   Cholesterol 141 0 - 200 mg/dL   Triglycerides 60.0 0.0 - 149.0 mg/dL   HDL 52.30 >39.00 mg/dL   VLDL 12.0 0.0 - 40.0 mg/dL   LDL Cholesterol 77 0 - 99 mg/dL   Total CHOL/HDL Ratio 3    NonHDL 88.77    great cholesterol profile   Patient Active Problem List   Diagnosis Date Noted  . Routine general medical examination at a health care facility 01/06/2016  . IBS (irritable colon syndrome) 05/31/2015  . Colon polyps 05/31/2015  . Trochanteric bursitis of both hips 05/31/2015  . Estrogen deficiency 12/05/2014  . Lower abdominal pain 09/26/2014  . Low back pain 09/26/2014  . Acute bronchitis 11/22/2013  . Encounter for Medicare annual wellness exam 09/24/2013  . Enlarged lymph node 03/07/2013  . Sarcoid ([place]) 08/09/2012  . Mixed incontinence urge and stress 03/16/2012  . ALOPECIA 11/14/2010  . GERD 01/24/2009  . NEOPLASM, MALIGNANT, BREAST, HX OF 01/02/2009  .  Hyperlipidemia 11/17/2007  . VAGINITIS, ATROPHIC, SYMPTOMATIC 11/17/2007  . Osteopenia 11/17/2007  . TINNITUS 09/07/2007  . ALLERGIC RHINITIS 09/07/2007  . DIVERTICULOSIS, COLON 09/07/2007  . Fatty liver 09/07/2007  . History of diverticulitis 08/12/2007   Past Medical History  Diagnosis Date  . Allergic rhinitis   . Diverticulosis   . History of shingles   . Atrophic vaginitis   . Osteopenia   . Sliding hiatal hernia   . GERD (gastroesophageal reflux disease)   . Arthritis   . Breast cancer ([place])     Right mastectomy  . IBS (irritable bowel syndrome)   . H/O: pneumonia     as child-viral pneumonia  . HLD (hyperlipidemia)     pt denies  . Allergy    Past Surgical History  Procedure Laterality Date  . Dilation and curettage of uterus  10/1999  . Laparoscopic total hysterectomy  10/03    for fibroids/polyp  . Appendectomy    . Breast lumpectomy    . Mastectomy  7/10    right  . Colonoscopy  02/2010    divertics, rpt 5 years  . Dexa  2005    osteopenia, no change from 2003  . Total abdominal hysterectomy  2003    for fibroids/polyps  . Basal cell carcinoma excision      scalp  . Polypectomy     Social History  Substance Use Topics  . Smoking status: Former Smoker    Quit date: 08/31/1968  . Smokeless tobacco: Never Used  . Alcohol Use: 0.0 oz/week    0 Standard drinks or equivalent per week     Comment: 1-2 glasses of wine/weekly   Family History  Problem Relation Age of Onset  . Prostate cancer Father   . Heart attack Paternal Grandmother   . Prostate cancer Brother   . Lymphoma Brother   . Stomach cancer Maternal Grandfather   . Colon cancer Neg Hx   . Rectal cancer Neg Hx   . Esophageal cancer Neg Hx    Allergies  Allergen Reactions  . Meloxicam     REACTION: rash  . Niacin  And Related Other (See Comments)    Caused black and blue marks all over body   Current Outpatient Prescriptions on File Prior to Visit  Medication Sig Dispense Refill  .  calcium-vitamin D (OSCAL WITH D) 500-200 MG-UNIT per tablet Take 1 tablet by mouth daily.    Marland Kitchen conjugated estrogens (PREMARIN) vaginal cream INSERT A SMALL AMOUNT USING APPLICATOR  INTRAVAGINALLY 2 TIMES PER WEEK AS      DIRECTED 127.5 g 5  . fish oil-omega-3 fatty acids 1000 MG capsule Take 1 g by mouth daily.     . Multiple Vitamins-Minerals (CENTRUM SILVER ULTRA WOMENS PO) Take by mouth.    Marland Kitchen omeprazole (PRILOSEC) 20 MG capsule TAKE ONE CAPSULE BY MOUTH DAILY AS NEEDED 30 capsule 0  . trimethoprim-polymyxin b (POLYTRIM) ophthalmic solution Instill 1 drop into both eyes four times daily for 5-7 days. 10 mL 0   No current facility-administered medications on file prior to visit.      Review of Systems Review of Systems  Constitutional: Negative for fever, appetite change, fatigue and unexpected weight change.  Eyes: Negative for pain and visual disturbance.  Respiratory: Negative for cough and shortness of breath.   Cardiovascular: Negative for cp or palpitations    Gastrointestinal: Negative for nausea, diarrhea and constipation.  Genitourinary: Negative for urgency and frequency.  Skin: Negative for pallor or rash   MSK pos for R leg bursitis symptoms  Neurological: Negative for weakness, light-headedness, numbness and headaches.  Hematological: Negative for adenopathy. Does not bruise/bleed easily.  Psychiatric/Behavioral: Negative for dysphoric mood. The patient is not nervous/anxious.         Objective:   Physical Exam  Constitutional: She appears well-developed and well-nourished. No distress.  overwt and well app  HENT:  Head: Normocephalic and atraumatic.  Right Ear: External ear normal.  Left Ear: External ear normal.  Mouth/Throat: Oropharynx is clear and moist.  Eyes: Conjunctivae and EOM are normal. Pupils are equal, round, and reactive to light. No scleral icterus.  Neck: Normal range of motion. Neck supple. No JVD present. Carotid bruit is not present. No  thyromegaly present.  Cardiovascular: Normal rate, regular rhythm, normal heart sounds and intact distal pulses.  Exam reveals no gallop.   Pulmonary/Chest: Effort normal and breath sounds normal. No respiratory distress. She has no wheezes. She exhibits no tenderness.  Abdominal: Soft. Bowel sounds are normal. She exhibits no distension, no abdominal bruit and no mass. There is no tenderness.  Genitourinary: No breast swelling, tenderness, discharge or bleeding.  S/p R mastectomy- site clear  L breast:: No mass, nodules, thickening, tenderness, bulging, retraction, inflamation, nipple discharge or skin changes noted.  No axillary or clavicular LA.      Musculoskeletal: Normal range of motion. She exhibits no edema or tenderness.  Lymphadenopathy:    She has no cervical adenopathy.  Neurological: She is alert. She has normal reflexes. No cranial nerve deficit. She exhibits normal muscle tone. Coordination normal.  Skin: Skin is warm and dry. No rash noted. No erythema. No pallor.  Many SKs diffusely on trunk   Stable lesion on L leg from sarcoid  Psychiatric: She has a normal mood and affect.          Assessment & Plan:   Problem List Items Addressed This Visit      Digestive   Colon polyps - Primary    Pt will get BE in 5 years due to tortuous colon (cannot have a colonoscopy)  Musculoskeletal and Integument   Osteopenia    Last dexa in low normal range One fall / no fx On ca and D  Urged compliance with D - level is low normal Scheduled dexa  Disc need for calcium/ vitamin D/ wt bearing exercise and bone density test every 2 y to monitor Disc safety/ fracture risk in detail          Other   Estrogen deficiency   Relevant Orders   DG Bone Density   NEOPLASM, MALIGNANT, BREAST, HX OF    Ordered her diag L mammogram Disc monthly self exams  Nl exam today      Relevant Orders   MM Digital Diagnostic Unilat L   Routine general medical examination at a  health care facility    Reviewed AMW Wbc up at time of that visit due to illness Reviewed health habits including diet and exercise and skin cancer prevention Reviewed appropriate screening tests for age  Also reviewed health mt list, fam hx and immunization status , as well as social and family history   See HPI Labs reviewed If you are interested in a shingles/zoster vaccine - call your insurance to check on coverage,( you should not get it within 1 month of other vaccines) , then call us for a prescription  for it to take to a pharmacy that gives the shot , or make a nurse visit to get it here depending on your coverage You need a mammogram - stop up front for your referral  Also for a bone density test  Take care of yourself Take your vitamin D every day  No change in medicines       Sarcoid ([place])    Off plaquenil now due to side eff /? Ineffective Will f/u with derm

## 2016-01-06 NOTE — Assessment & Plan Note (Signed)
Reviewed AMW Wbc up at time of that visit due to illness Reviewed health habits including diet and exercise and skin cancer prevention Reviewed appropriate screening tests for age  Also reviewed health mt list, fam hx and immunization status , as well as social and family history   See HPI Labs reviewed If you are interested in a shingles/zoster vaccine - call your insurance to check on coverage,( you should not get it within 1 month of other vaccines) , then call us for a prescription  for it to take to a pharmacy that gives the shot , or make a nurse visit to get it here depending on your coverage You need a mammogram - stop up front for your referral  Also for a bone density test  Take care of yourself Take your vitamin D every day  No change in medicines

## 2016-01-06 NOTE — Assessment & Plan Note (Signed)
Ordered her diag L mammogram Disc monthly self exams  Nl exam today

## 2016-01-06 NOTE — Patient Instructions (Addendum)
If you are interested in a shingles/zoster vaccine - call your insurance to check on coverage,( you should not get it within 1 month of other vaccines) , then call us for a prescription  for it to take to a pharmacy that gives the shot , or make a nurse visit to get it here depending on your coverage You need a mammogram - stop up front for your referral  Also for a bone density test  Take care of yourself Take your vitamin D every day  No change in medicines

## 2016-01-07 ENCOUNTER — Other Ambulatory Visit: Payer: Self-pay | Admitting: Family Medicine

## 2016-01-07 DIAGNOSIS — Z1231 Encounter for screening mammogram for malignant neoplasm of breast: Secondary | ICD-10-CM

## 2016-01-13 ENCOUNTER — Ambulatory Visit (INDEPENDENT_AMBULATORY_CARE_PROVIDER_SITE_OTHER)
Admission: RE | Admit: 2016-01-13 | Discharge: 2016-01-13 | Disposition: A | Discharge status: Home / Self Care | Payer: Medicare HMO | Source: Ambulatory Visit | Attending: Family Medicine | Admitting: Family Medicine

## 2016-01-13 DIAGNOSIS — E2839 Other primary ovarian failure: Secondary | ICD-10-CM | POA: Diagnosis not present

## 2016-01-24 ENCOUNTER — Ambulatory Visit
Admission: RE | Admit: 2016-01-24 | Discharge: 2016-01-24 | Disposition: A | Discharge status: Home / Self Care | Payer: Medicare HMO | Source: Ambulatory Visit | Attending: Family Medicine | Admitting: Family Medicine

## 2016-01-24 DIAGNOSIS — Z1231 Encounter for screening mammogram for malignant neoplasm of breast: Secondary | ICD-10-CM

## 2016-01-31 DIAGNOSIS — Z872 Personal history of diseases of the skin and subcutaneous tissue: Secondary | ICD-10-CM | POA: Diagnosis not present

## 2016-01-31 DIAGNOSIS — Z85828 Personal history of other malignant neoplasm of skin: Secondary | ICD-10-CM | POA: Diagnosis not present

## 2016-01-31 DIAGNOSIS — Z08 Encounter for follow-up examination after completed treatment for malignant neoplasm: Secondary | ICD-10-CM | POA: Diagnosis not present

## 2016-01-31 DIAGNOSIS — L259 Unspecified contact dermatitis, unspecified cause: Secondary | ICD-10-CM | POA: Diagnosis not present

## 2016-01-31 DIAGNOSIS — L309 Dermatitis, unspecified: Secondary | ICD-10-CM | POA: Diagnosis not present

## 2016-02-05 DIAGNOSIS — R69 Illness, unspecified: Secondary | ICD-10-CM | POA: Diagnosis not present

## 2016-03-05 DIAGNOSIS — H2513 Age-related nuclear cataract, bilateral: Secondary | ICD-10-CM | POA: Diagnosis not present

## 2016-03-16 ENCOUNTER — Encounter: Payer: Self-pay | Admitting: Family Medicine

## 2016-03-16 ENCOUNTER — Ambulatory Visit (INDEPENDENT_AMBULATORY_CARE_PROVIDER_SITE_OTHER): Payer: Medicare HMO | Admitting: Family Medicine

## 2016-03-16 VITALS — BP 102/64 | HR 84 | Temp 98.6°F | Ht 61.0 in | Wt 153.2 lb

## 2016-03-16 DIAGNOSIS — K589 Irritable bowel syndrome without diarrhea: Secondary | ICD-10-CM | POA: Diagnosis not present

## 2016-03-16 DIAGNOSIS — R21 Rash and other nonspecific skin eruption: Secondary | ICD-10-CM

## 2016-03-16 NOTE — Assessment & Plan Note (Signed)
R flank - almost plaque like with thickened erythematous skin  ? If rel to sarcoid but first bx from derm is neg  Not bothersome Suspect autoimmune but enc pt to continue f/u with derm to sample a different area if this worsens or becomes symptomatic

## 2016-03-16 NOTE — Progress Notes (Signed)
Pre visit review using our clinic review tool, if applicable. No additional management support is needed unless otherwise documented below in the visit note. 

## 2016-03-16 NOTE — Progress Notes (Signed)
Subjective:    Patient ID: Judith [name], female    DOB: 1946-03-26, 70 y.o.   MRN: [id number]  HPI Here with skin change - looks like a rash  3-4 mo  Does not itch bx from her derm- no dx found   Thinks she wants to do a stool test  Colonoscopy 2016  BE in Jan -did not find anything   She was exp to c diff and e coli- son had it last week in the hospital  Worried about contracting it  Has loose stool in am and then it gets better  No abd pain and no fever   Has IBS- for at least 2 years  This has not changed  Has to be careful with raw fruit and veggies   Patient Active Problem List   Diagnosis Date Noted  . Rash and nonspecific skin eruption 03/16/2016  . Routine general medical examination at a health care facility 01/06/2016  . IBS (irritable colon syndrome) 05/31/2015  . Colon polyps 05/31/2015  . Trochanteric bursitis of both hips 05/31/2015  . Estrogen deficiency 12/05/2014  . Lower abdominal pain 09/26/2014  . Low back pain 09/26/2014  . Encounter for Medicare annual wellness exam 09/24/2013  . Enlarged lymph node 03/07/2013  . Sarcoid ([place]) 08/09/2012  . Mixed incontinence urge and stress 03/16/2012  . ALOPECIA 11/14/2010  . GERD 01/24/2009  . NEOPLASM, MALIGNANT, BREAST, HX OF 01/02/2009  . Hyperlipidemia 11/17/2007  . VAGINITIS, ATROPHIC, SYMPTOMATIC 11/17/2007  . Osteopenia 11/17/2007  . TINNITUS 09/07/2007  . ALLERGIC RHINITIS 09/07/2007  . DIVERTICULOSIS, COLON 09/07/2007  . Fatty liver 09/07/2007  . History of diverticulitis 08/12/2007   Past Medical History  Diagnosis Date  . Allergic rhinitis   . Diverticulosis   . History of shingles   . Atrophic vaginitis   . Osteopenia   . Sliding hiatal hernia   . GERD (gastroesophageal reflux disease)   . Arthritis   . Breast cancer ([place])     Right mastectomy  . IBS (irritable bowel syndrome)   . H/O: pneumonia     as child-viral pneumonia  . HLD (hyperlipidemia)     pt denies  . Allergy     Past Surgical History  Procedure Laterality Date  . Dilation and curettage of uterus  10/1999  . Laparoscopic total hysterectomy  10/03    for fibroids/polyp  . Appendectomy    . Breast lumpectomy    . Colonoscopy  02/2010    divertics, rpt 5 years  . Dexa  2005    osteopenia, no change from 2003  . Total abdominal hysterectomy  2003    for fibroids/polyps  . Basal cell carcinoma excision      scalp  . Polypectomy    . Mastectomy  7/10    right   Social History  Substance Use Topics  . Smoking status: Former Smoker    Quit date: 08/31/1968  . Smokeless tobacco: Never Used  . Alcohol Use: 0.0 oz/week    0 Standard drinks or equivalent per week     Comment: 1-2 glasses of wine/weekly   Family History  Problem Relation Age of Onset  . Prostate cancer Father   . Heart attack Paternal Grandmother   . Prostate cancer Brother   . Lymphoma Brother   . Stomach cancer Maternal Grandfather   . Colon cancer Neg Hx   . Rectal cancer Neg Hx   . Esophageal cancer Neg Hx    Allergies  Allergen  Reactions  . Meloxicam     REACTION: rash  . Niacin And Related Other (See Comments)    Caused black and blue marks all over body   Current Outpatient Prescriptions on File Prior to Visit  Medication Sig Dispense Refill  . calcium-vitamin D (OSCAL WITH D) 500-200 MG-UNIT per tablet Take 1 tablet by mouth daily.    Marland Kitchen conjugated estrogens (PREMARIN) vaginal cream INSERT A SMALL AMOUNT USING APPLICATOR  INTRAVAGINALLY 2 TIMES PER WEEK AS      DIRECTED 30 g 11  . fish oil-omega-3 fatty acids 1000 MG capsule Take 1 g by mouth daily.     . Multiple Vitamins-Minerals (CENTRUM SILVER ULTRA WOMENS PO) Take by mouth.    Marland Kitchen omeprazole (PRILOSEC) 20 MG capsule TAKE ONE CAPSULE BY MOUTH DAILY AS NEEDED 30 capsule 11   No current facility-administered medications on file prior to visit.    Review of Systems Review of Systems  Constitutional: Negative for fever, appetite change, fatigue and  unexpected weight change.  Eyes: Negative for pain and visual disturbance.  Respiratory: Negative for cough and shortness of breath.   Cardiovascular: Negative for cp or palpitations    Gastrointestinal: Negative for nausea, and constipation. neg for blood or mucous in stool, abd pain , fever , pos for loose stool only in am  Genitourinary: Negative for urgency and frequency.  Skin: Negative for pallor and pos for rash  neg for itching  Neurological: Negative for weakness, light-headedness, numbness and headaches.  Hematological: Negative for adenopathy. Does not bruise/bleed easily.  Psychiatric/Behavioral: Negative for dysphoric mood. The patient is not nervous/anxious.         Objective:   Physical Exam  Constitutional: She appears well-developed and well-nourished. No distress.  overwt and well app  HENT:  Head: Normocephalic and atraumatic.  Mouth/Throat: Oropharynx is clear and moist.  Eyes: Conjunctivae and EOM are normal. Pupils are equal, round, and reactive to light. No scleral icterus.  Neck: Normal range of motion. Neck supple.  Cardiovascular: Normal rate, regular rhythm and normal heart sounds.   Pulmonary/Chest: Effort normal and breath sounds normal. No respiratory distress. She has no wheezes. She has no rales.  Abdominal: Soft. Bowel sounds are normal. She exhibits no distension and no mass. There is no tenderness. There is no rebound and no guarding.  Musculoskeletal: She exhibits no edema.  Lymphadenopathy:    She has no cervical adenopathy.  Neurological: She is alert.  Skin: Skin is warm and dry. Rash noted. No erythema. No pallor.  R flank has moderately large area of mildly erythematous skin (almost plaque like)  No scale/vesicles/tenderness or excoriations  Psychiatric: She has a normal mood and affect.          Assessment & Plan:   Problem List Items Addressed This Visit      Digestive   IBS (irritable colon syndrome) - Primary    Pt was  concerned about loose stool in light of recent exp to c diff and e coli from her son  Her symptoms have not changed however - 2 loose stools in am that improve inst to call if any abd pain/ fever/ blood or mucous in stool or prolonged diarrhea  Enc use of citrucel or other fiber supplement to see if this helps Probiotic only if helpful  She has had recent bowel imaging-colonosc/BE        Musculoskeletal and Integument   Rash and nonspecific skin eruption    R flank - almost plaque  like with thickened erythematous skin  ? If rel to sarcoid but first bx from derm is neg  Not bothersome Suspect autoimmune but enc pt to continue f/u with derm to sample a different area if this worsens or becomes symptomatic

## 2016-03-16 NOTE — Patient Instructions (Signed)
For IBS try a fiber supplement daily to bulk up stool- I like citrucel best -mix with water Use a probiotic if it helps  If you develop abdominal pain/ fever / diarrhea all day long / or mucous in stool - let us know  Rash does resemble any infection I can tell

## 2016-03-16 NOTE — Assessment & Plan Note (Signed)
Pt was concerned about loose stool in light of recent exp to c diff and e coli from her son  Her symptoms have not changed however - 2 loose stools in am that improve inst to call if any abd pain/ fever/ blood or mucous in stool or prolonged diarrhea  Enc use of citrucel or other fiber supplement to see if this helps Probiotic only if helpful  She has had recent bowel imaging-colonosc/BE

## 2016-04-03 DIAGNOSIS — C50911 Malignant neoplasm of unspecified site of right female breast: Secondary | ICD-10-CM | POA: Diagnosis not present

## 2016-06-10 ENCOUNTER — Ambulatory Visit (INDEPENDENT_AMBULATORY_CARE_PROVIDER_SITE_OTHER): Payer: Medicare HMO

## 2016-06-10 DIAGNOSIS — Z23 Encounter for immunization: Secondary | ICD-10-CM | POA: Diagnosis not present

## 2016-08-05 ENCOUNTER — Other Ambulatory Visit: Payer: Self-pay | Admitting: Family Medicine

## 2016-08-05 DIAGNOSIS — B372 Candidiasis of skin and nail: Secondary | ICD-10-CM | POA: Diagnosis not present

## 2016-08-05 DIAGNOSIS — D044 Carcinoma in situ of skin of scalp and neck: Secondary | ICD-10-CM | POA: Diagnosis not present

## 2016-08-05 DIAGNOSIS — L570 Actinic keratosis: Secondary | ICD-10-CM | POA: Diagnosis not present

## 2016-08-05 DIAGNOSIS — Z85828 Personal history of other malignant neoplasm of skin: Secondary | ICD-10-CM | POA: Diagnosis not present

## 2016-08-05 DIAGNOSIS — Z09 Encounter for follow-up examination after completed treatment for conditions other than malignant neoplasm: Secondary | ICD-10-CM | POA: Diagnosis not present

## 2016-08-05 DIAGNOSIS — X32XXXA Exposure to sunlight, initial encounter: Secondary | ICD-10-CM | POA: Diagnosis not present

## 2016-08-05 DIAGNOSIS — D485 Neoplasm of uncertain behavior of skin: Secondary | ICD-10-CM | POA: Diagnosis not present

## 2016-08-05 DIAGNOSIS — C44311 Basal cell carcinoma of skin of nose: Secondary | ICD-10-CM | POA: Diagnosis not present

## 2016-08-05 DIAGNOSIS — Z872 Personal history of diseases of the skin and subcutaneous tissue: Secondary | ICD-10-CM | POA: Diagnosis not present

## 2016-08-31 HISTORY — PX: BASAL CELL CARCINOMA EXCISION: SHX1214

## 2016-09-24 DIAGNOSIS — L905 Scar conditions and fibrosis of skin: Secondary | ICD-10-CM | POA: Diagnosis not present

## 2016-09-24 DIAGNOSIS — C44311 Basal cell carcinoma of skin of nose: Secondary | ICD-10-CM | POA: Diagnosis not present

## 2016-09-29 DIAGNOSIS — H01003 Unspecified blepharitis right eye, unspecified eyelid: Secondary | ICD-10-CM | POA: Diagnosis not present

## 2016-10-01 DIAGNOSIS — D044 Carcinoma in situ of skin of scalp and neck: Secondary | ICD-10-CM | POA: Diagnosis not present

## 2016-11-16 ENCOUNTER — Ambulatory Visit (INDEPENDENT_AMBULATORY_CARE_PROVIDER_SITE_OTHER)
Admission: RE | Admit: 2016-11-16 | Discharge: 2016-11-16 | Disposition: A | Discharge status: Home / Self Care | Payer: Medicare HMO | Source: Ambulatory Visit | Attending: Family Medicine | Admitting: Family Medicine

## 2016-11-16 ENCOUNTER — Telehealth: Payer: Self-pay

## 2016-11-16 ENCOUNTER — Ambulatory Visit (INDEPENDENT_AMBULATORY_CARE_PROVIDER_SITE_OTHER): Payer: Medicare HMO | Admitting: Family Medicine

## 2016-11-16 ENCOUNTER — Encounter: Payer: Self-pay | Admitting: Family Medicine

## 2016-11-16 VITALS — BP 120/78 | HR 86 | Temp 99.2°F | Wt 159.8 lb

## 2016-11-16 DIAGNOSIS — R05 Cough: Secondary | ICD-10-CM | POA: Diagnosis not present

## 2016-11-16 DIAGNOSIS — R059 Cough, unspecified: Secondary | ICD-10-CM | POA: Insufficient documentation

## 2016-11-16 DIAGNOSIS — R051 Acute cough: Secondary | ICD-10-CM | POA: Insufficient documentation

## 2016-11-16 DIAGNOSIS — R911 Solitary pulmonary nodule: Secondary | ICD-10-CM

## 2016-11-16 MED ORDER — BENZONATATE 200 MG PO CAPS
200.0000 mg | ORAL_CAPSULE | Freq: Three times a day (TID) | ORAL | 0 refills | Status: DC | PRN
Start: 2016-11-16 — End: 2016-12-24

## 2016-11-16 MED ORDER — DOXYCYCLINE HYCLATE 100 MG PO TABS: 100.0000 mg | ORAL_TABLET | Freq: Two times a day (BID) | ORAL | 0 refills | Status: DC

## 2016-11-16 MED ORDER — HYDROCODONE-HOMATROPINE 5-1.5 MG/5ML PO SYRP: 5.0000 mL | ORAL_SOLUTION | Freq: Three times a day (TID) | ORAL | 0 refills | Status: DC | PRN

## 2016-11-16 NOTE — Patient Instructions (Signed)
Go to the lab on the way out.  We'll contact you with your xray report. Start doxycycline.  Use tessalon pills for the cough, if not better then use the cough syrup. Take care.  Glad to see you.  Update [place] as needed.

## 2016-11-16 NOTE — Telephone Encounter (Signed)
Aware, thanks!

## 2016-11-16 NOTE — Assessment & Plan Note (Signed)
cxr pending.  Possible bronchitis vs atypical PNA.   Start doxycycline.  Use tessalon pills for the cough, if not better then use hycodan.  Nontoxic.  Update [place] as needed. Okay for outpatient f/u.

## 2016-11-16 NOTE — Telephone Encounter (Signed)
Judith [name] with [facility] called report for CXR. Report is in Epic and copy taken to Dr Josefine Class area.

## 2016-11-16 NOTE — Progress Notes (Signed)
Sx started a few weeks ago.  Husband was also sick.  Coughing for three weeks.  h/o sarcoid known- per patient no known pulmonary involvement.  She initially thought the sx were related to allergies.  She isn't SOB at baseline, unless she has a bad coughing fit.  No temps >100.  No vomiting, no diarrhea.  No wheeze usually, some possible occ wheeze.  Some sputum, discolored some of the time.  Can be clear also.  abd wall sore from coughing but no arm/leg pain.  No new rash.  In meantime, tried OTC meds for the cough.  No h/o asthma.   Meds, vitals, and allergies reviewed.   ROS: Per HPI unless specifically indicated in ROS section   GEN: nad, alert and oriented HEENT: mucous membranes moist, tm w/o erythema, nasal exam w/o erythema, clear discharge noted,  OP with cobblestoning NECK: supple w/o LA CV: rrr.   PULM: ctab, no inc wob, no focal dec in BS, no wheeze.  EXT: no edema SKIN: no acute rash

## 2016-11-16 NOTE — Telephone Encounter (Signed)
Called pt.  D/w pt re: CT scan order given the CXR.  She agrees to proceed.  Ordered.   Routed to PCP as FYI.

## 2016-11-16 NOTE — Progress Notes (Signed)
Pre visit review using our clinic review tool, if applicable. No additional management support is needed unless otherwise documented below in the visit note. 

## 2016-11-23 ENCOUNTER — Ambulatory Visit: Payer: Medicare HMO

## 2016-11-25 ENCOUNTER — Ambulatory Visit
Admission: RE | Admit: 2016-11-25 | Discharge: 2016-11-25 | Disposition: A | Discharge status: Home / Self Care | Payer: Medicare HMO | Source: Ambulatory Visit | Attending: Family Medicine | Admitting: Family Medicine

## 2016-11-25 ENCOUNTER — Other Ambulatory Visit: Payer: Self-pay | Admitting: Family Medicine

## 2016-11-25 DIAGNOSIS — R918 Other nonspecific abnormal finding of lung field: Secondary | ICD-10-CM | POA: Insufficient documentation

## 2016-11-25 DIAGNOSIS — I700 Atherosclerosis of aorta: Secondary | ICD-10-CM | POA: Insufficient documentation

## 2016-11-25 DIAGNOSIS — R911 Solitary pulmonary nodule: Secondary | ICD-10-CM

## 2016-11-25 DIAGNOSIS — K760 Fatty (change of) liver, not elsewhere classified: Secondary | ICD-10-CM | POA: Insufficient documentation

## 2016-11-30 ENCOUNTER — Telehealth: Payer: Self-pay | Admitting: *Deleted

## 2016-11-30 ENCOUNTER — Telehealth: Payer: Self-pay | Admitting: Family Medicine

## 2016-11-30 DIAGNOSIS — Z0000 Encounter for general adult medical examination without abnormal findings: Secondary | ICD-10-CM

## 2016-11-30 MED ORDER — PREDNISONE 20 MG PO TABS: 40.0000 mg | ORAL_TABLET | Freq: Every day | ORAL | 0 refills | Status: DC

## 2016-11-30 NOTE — Telephone Encounter (Signed)
Patient Name: Judith [name] DOB: 02-18-1946 Initial Comment Caller states that she is coughing and wheezing, and she wants to know what she can do. Nurse Assessment Nurse: Vallery Sa, RN, Cathy Date/Time (Eastern Time): 11/30/2016 10:32:51 AM Confirm and document reason for call. If symptomatic, describe symptoms. ---Caller states she developed a productive cough about a month ago and wheezing about a week ago. She is going out of town tomorrow. No fever. Alert and responsive. Does the patient have any new or worsening symptoms? ---Yes Will a triage be completed? ---Yes Related visit to physician within the last 2 weeks? ---Yes Does the PT have any chronic conditions? (i.e. diabetes, asthma, etc.) ---Yes List chronic conditions. ---Sarcoidosis Is this a behavioral health or substance abuse call? ---No Guidelines Guideline Title Affirmed Question Affirmed Notes Chest Pain [1] Chest pain lasts > 5 minutes AND [2] age > 30 Final Disposition User Call EMS 911 Now [place], RN, [employer] states she has had constant mild chest pain that has been constant for more than the past 5 minutes. She doesn't want to call 911 or Go to the ER. She states she has Sarcoidosis and has a followup appointment with Pulmonologist scheduled. She shares she is going out of town with her family tomorrow and just wants to know if the MD thinks she should take any new medication with her. Called the office backline and Earl Lagos states someone from the office will call her back. Referrals GO TO FACILITY REFUSED Disagree/Comply: Disagree Disagree/Comply Reason: Disagree with instructions

## 2016-11-30 NOTE — Telephone Encounter (Signed)
I'm just getting this message now. Would rec office eval prior to going out of town if ongoing productive cough and chest pain.  Will route to provider who saw her last month for this for his recs.  plz get more details on wheezing or any shortness of breath? Did cough syrup help? Did she initially improve on doxy course?

## 2016-11-30 NOTE — Telephone Encounter (Signed)
Dr. Silvio Pate has already called and spoke to pt (see separate phone note) and sent her in prednisone

## 2016-11-30 NOTE — Telephone Encounter (Signed)
[facility] contacted office stating pt is experiencing chest pain. EMS was recently contacted and pt had CT chest. Pt is not wanting to go to the ED although she was advised to do so, but is wanting to know if there is anything she can take OTC or be prescribed. pls advise. Pt to be advised per [facility], Dr Glori Bickers out of office and request will be sent to alternate provider

## 2016-11-30 NOTE — Telephone Encounter (Signed)
Aware-thank you

## 2016-11-30 NOTE — Telephone Encounter (Signed)
Spoke to patient Still having bad cough--- not chest pain She is "tired from the coughing" Is going to [place] on vacation---has occasional coughing attack and then wheeze and feel short of breath Given pills for cough--not helping

## 2016-11-30 NOTE — Telephone Encounter (Signed)
She notes history of sarcoid but only on scalp. No lung involvement. Does sound like asthma--though she never had this before Will give short course of prednisone--5 days Seeing thoracic surgeon next week

## 2016-12-01 NOTE — Telephone Encounter (Signed)
Already addressed, see other notes.

## 2016-12-07 ENCOUNTER — Other Ambulatory Visit: Payer: Self-pay | Admitting: Family Medicine

## 2016-12-07 NOTE — Telephone Encounter (Signed)
Please refill times one

## 2016-12-07 NOTE — Telephone Encounter (Signed)
Last office visit 11/16/2016 with Dr. Damita Dunnings for cough.  Last refilled 08/05/2016 for #30 with no refills by Dr. Lorelei Pont.  Ok to refill?

## 2016-12-07 NOTE — Telephone Encounter (Signed)
Last time I can tell it was filled was 08/19/15, and not on med list, please advise

## 2016-12-07 NOTE — Telephone Encounter (Signed)
done

## 2016-12-07 NOTE — Telephone Encounter (Signed)
Please verify what she is taking it for and that she is not on any other anti inflammatories Tell her to take with a meal  She had a rash from meloxicam in the past but not this I think  Refill times 3 Thanks

## 2016-12-08 ENCOUNTER — Encounter: Payer: Self-pay | Admitting: Thoracic Surgery (Cardiothoracic Vascular Surgery)

## 2016-12-08 ENCOUNTER — Other Ambulatory Visit: Payer: Self-pay | Admitting: Thoracic Surgery (Cardiothoracic Vascular Surgery)

## 2016-12-08 ENCOUNTER — Institutional Professional Consult (permissible substitution) (INDEPENDENT_AMBULATORY_CARE_PROVIDER_SITE_OTHER): Payer: Medicare HMO | Admitting: Thoracic Surgery (Cardiothoracic Vascular Surgery)

## 2016-12-08 VITALS — BP 143/78 | HR 100 | Resp 16 | Ht 61.5 in | Wt 154.0 lb

## 2016-12-08 DIAGNOSIS — Z853 Personal history of malignant neoplasm of breast: Secondary | ICD-10-CM | POA: Diagnosis not present

## 2016-12-08 DIAGNOSIS — R918 Other nonspecific abnormal finding of lung field: Secondary | ICD-10-CM

## 2016-12-08 DIAGNOSIS — C50919 Malignant neoplasm of unspecified site of unspecified female breast: Secondary | ICD-10-CM

## 2016-12-08 NOTE — Telephone Encounter (Signed)
Pt said that she is taking med for "back pain flare up" pt said she was taking OTC naproxen and it caused a bad upset stomach so she doesn't need this naproxen refilled because she stopped taking it, she said she will pick up the robaxin today to see if that will help but she wants to know what else she can take OTC or Rx for back pain, pt took OTC tylenol this am

## 2016-12-08 NOTE — Telephone Encounter (Signed)
She can take the tylenol as directed and the robaxin Heat and ice may also help  f/u if needed -hope she feels better soon

## 2016-12-08 NOTE — Telephone Encounter (Signed)
Per DPR left detailed voicemail of Dr. Marliss Coots comments

## 2016-12-08 NOTE — Progress Notes (Signed)
PCP is Loura Pardon, MD Referring Provider is Tonia Ghent, MD  Chief Complaint  Patient presents with  . Lung Lesion    multiple...CT CHEST 10/28/16...R MASTECTOMY    HPI: Judith [name] is sent for consultation regarding multiple pulmonary nodules.  Judith [name] is a 71 year old woman with a past medical history significant for breast cancer in 2001 with recurrence in 2010, status post right mastectomy, hiatal hernia, gastroesophageal reflux, hyperlipidemia, arthritis, irritable bowel syndrome, osteopenia. She also has a history of sarcoidosis of the scalp. She had no known or documented pulmonary manifestations although she did have a tiny lung nodule noted on chest x-rays dating back to at least 2014.  Recently she developed rather acute onset of cough and wheezing. She had been working on her yard the day before and then the following day workup with a cough. His cough was frequent and productive of clear sputum. She also would have wheezing spells. She did not have any significant fevers, chills, or sweats. She was treated with antibiotics and then a short course of prednisone. Her cough has improved, but has not completely resolved. She does have a remote history of smoking but quit in 1976.  Zubrod Score: At the time of surgery this patient's most appropriate activity status/level should be described as: [x]     0    Normal activity, no symptoms []     1    Restricted in physical strenuous activity but ambulatory, able to do out light work []     2    Ambulatory and capable of self care, unable to do work activities, up and about >50 % of waking hours                              []     3    Only limited self care, in bed greater than 50% of waking hours []     4    Completely disabled, no self care, confined to bed or chair []     5    Moribund    Past Medical History:  Diagnosis Date  . Allergic rhinitis   . Allergy   . Arthritis   . Atrophic vaginitis   . Breast cancer ([place])     Right mastectomy  . Diverticulosis   . GERD (gastroesophageal reflux disease)   . H/O: pneumonia    as child-viral pneumonia  . History of shingles   . HLD (hyperlipidemia)    pt denies  . IBS (irritable bowel syndrome)   . Osteopenia   . Sliding hiatal hernia     Past Surgical History:  Procedure Laterality Date  . APPENDECTOMY    . BASAL CELL CARCINOMA EXCISION     scalp  . BREAST LUMPECTOMY    . COLONOSCOPY  02/2010   divertics, rpt 5 years  . DEXA  2005   osteopenia, no change from 2003  . DILATION AND CURETTAGE OF UTERUS  10/1999  . LAPAROSCOPIC TOTAL HYSTERECTOMY  10/03   for fibroids/polyp  . MASTECTOMY  7/10   right  . POLYPECTOMY    . TOTAL ABDOMINAL HYSTERECTOMY  2003   for fibroids/polyps    Family History  Problem Relation Age of Onset  . Prostate cancer Father   . Heart attack Paternal Grandmother   . Prostate cancer Brother   . Lymphoma Brother   . Stomach cancer Maternal Grandfather   . Colon cancer Neg Hx   . Rectal  cancer Neg Hx   . Esophageal cancer Neg Hx     Social History Social History  Substance Use Topics  . Smoking status: Former Smoker    Quit date: 08/31/1968  . Smokeless tobacco: Never Used  . Alcohol use 0.0 oz/week     Comment: 1-2 glasses of wine/weekly    Current Outpatient Prescriptions  Medication Sig Dispense Refill  . calcium-vitamin D (OSCAL WITH D) 500-200 MG-UNIT per tablet Take 1 tablet by mouth daily.    Marland Kitchen doxycycline (VIBRA-TABS) 100 MG tablet Take 1 tablet (100 mg total) by mouth 2 (two) times daily. 20 tablet 0  . fish oil-omega-3 fatty acids 1000 MG capsule Take 1 g by mouth daily.     . Multiple Vitamins-Minerals (CENTRUM SILVER ULTRA WOMENS PO) Take by mouth.    Marland Kitchen omeprazole (PRILOSEC) 20 MG capsule TAKE ONE CAPSULE BY MOUTH DAILY AS NEEDED 30 capsule 11  . benzonatate (TESSALON) 200 MG capsule Take 1 capsule (200 mg total) by mouth 3 (three) times daily as needed. (Patient not taking: Reported on 12/08/2016)  30 capsule 0  . HYDROcodone-homatropine (HYCODAN) 5-1.5 MG/5ML syrup Take 5 mLs by mouth every 8 (eight) hours as needed for cough (sedation caution). (Patient not taking: Reported on 12/08/2016) 75 mL 0  . methocarbamol (ROBAXIN) 500 MG tablet TAKE ONE TABLET BY MOUTH EVERY EIGHT HOURS AS NEEDED FOR MUSCLE SPASMS (Patient not taking: Reported on 12/08/2016) 30 tablet 1  . predniSONE (DELTASONE) 20 MG tablet      No current facility-administered medications for this visit.     Allergies  Allergen Reactions  . Meloxicam     REACTION: rash  . Niacin And Related Other (See Comments)    Caused black and blue marks all over body    Review of Systems  Constitutional: Positive for activity change and fatigue. Negative for chills, fever and unexpected weight change.  HENT: Negative for trouble swallowing and voice change.   Eyes: Negative for visual disturbance.  Respiratory: Positive for cough, shortness of breath and wheezing.   Cardiovascular: Negative for chest pain and leg swelling.  Gastrointestinal: Positive for abdominal pain (Reflux with heartburn). Negative for blood in stool.  Genitourinary: Negative for difficulty urinating and dysuria.  Musculoskeletal: Positive for arthralgias and myalgias.  Allergic/Immunologic:       Sarcoid of the scalp  Neurological: Negative for syncope and weakness.  Hematological: Negative for adenopathy. Does not bruise/bleed easily.  Psychiatric/Behavioral: Negative.     BP (!) 143/78 (BP Location: Right Arm, Patient Position: Sitting, Cuff Size: Large)   Pulse 100   Resp 16   Ht 5' 1.5" (1.562 m)   Wt 154 lb (69.9 kg)   SpO2 99% Comment: ON RA  BMI 28.63 kg/m  Physical Exam  Constitutional: She is oriented to person, place, and time. She appears well-developed and well-nourished. No distress.  HENT:  Head: Normocephalic and atraumatic.  Mouth/Throat: No oropharyngeal exudate.  Eyes: Conjunctivae and EOM are normal. No scleral icterus.   Neck: Neck supple. No thyromegaly present.  Cardiovascular: Normal rate, regular rhythm, normal heart sounds and intact distal pulses.  Exam reveals no gallop and no friction rub.   No murmur heard. Pulmonary/Chest: Effort normal and breath sounds normal. No respiratory distress. She has no wheezes. She has no rales.  Coughs frequently  Abdominal: Soft. She exhibits no distension. There is no tenderness.  Musculoskeletal: She exhibits no edema or deformity.  Lymphadenopathy:    She has no cervical adenopathy.  Neurological:  She is alert and oriented to person, place, and time. No cranial nerve deficit.  No focal motor deficit  Skin: Skin is warm and dry.  Vitals reviewed.    Diagnostic Tests: CT CHEST WITHOUT CONTRAST  TECHNIQUE: Multidetector CT imaging of the chest was performed following the standard protocol without IV contrast.  COMPARISON:  11/16/2016 chest radiograph.  FINDINGS: Cardiovascular: Normal heart size. No significant pericardial fluid/thickening. Atherosclerotic nonaneurysmal thoracic aorta. Normal caliber pulmonary arteries.  Mediastinum/Nodes: No discrete thyroid nodules. Unremarkable esophagus. No axillary adenopathy. Mildly enlarged 1.1 cm subcarinal node (series 2/ image 63). No additional pathologically enlarged mediastinal or gross hilar nodes on this noncontrast scan.  Lungs/Pleura: No pneumothorax. No pleural effusion. There are several irregular subsolid pulmonary nodules scattered throughout both lungs, largest 2.3 x 1.4 cm in the peripheral right upper lobe (series 3/ image 53), 2.4 x 1.6 cm in the anterior right lower lobe (series 3/ image 81) and 2.5 x 2.1 cm in the medial left upper lobe (series 3/ image 62). There are a few scattered solid pulmonary nodules in the left upper lobe measuring up to the 0.9 cm (series 3/ image 43).  Upper abdomen: Mild diffuse hepatic steatosis.  Musculoskeletal: No aggressive appearing focal  osseous lesions. Moderate thoracic spondylosis. Right mastectomy.  IMPRESSION: 1. Several irregular subsolid pulmonary nodules scattered throughout both lungs, largest 2.5 cm in the left upper lobe. The nodular opacity described in the right lung on the recent chest radiograph correlates with a subsolid 2.3 cm peripheral right upper lobe pulmonary nodule, which has apparently increased compared to a 09/20/2012 chest radiograph. Additional solid left upper lobe pulmonary nodules measuring up to 0.9 cm. Multilobar pulmonary neoplasm is not excluded, although the differential includes infection. Consider further evaluation with PET-CT. 2. Mild subcarinal lymphadenopathy, nonspecific. 3. Aortic atherosclerosis. 4. Mild diffuse hepatic steatosis.   Electronically Signed   By: Ilona Sorrel M.D.   On: 11/25/2016 08:26 I personally reviewed the CT chest and concur with the findings noted above. When viewed on the coronal and sagittal images the majority of the "nodules" have more of a wedge shape.  Impression: Judith [name] is a 71 year old woman with a past history significant for breast cancer with no evidence of disease dating back to 2009 and cutaneous sarcoidosis. She recently developed a symptomatic cough. A chest x-ray showed increase in size of a small nodule from a film 4 years ago and on CT she has multiple sub-solid and solid opacities in her lungs bilaterally. The differential diagnosis includes primary neoplasm, metastatic cancer, infection, sarcoidosis or other inflammatory disorders. Of these, infection or sarcoidosis appear to be the most likely.  Our options would be to attempt to biopsy 1 more of these lesions either bronchoscopically or with CT guidance or to follow her radiographically. Given the time course of her illness and the possibility that this is an acute infectious process I would favor early radiographic follow-up rather than an attempt to biopsy. This is  especially true in light of her clinical improvement with the short course of antibiotics and steroids that she received.  Plan: Return in 6 weeks with CT of chest  Melrose Nakayama, MD [facility] [phone]

## 2016-12-09 ENCOUNTER — Encounter: Payer: Self-pay | Admitting: Family Medicine

## 2016-12-10 ENCOUNTER — Ambulatory Visit (INDEPENDENT_AMBULATORY_CARE_PROVIDER_SITE_OTHER): Payer: Medicare HMO | Admitting: Family Medicine

## 2016-12-10 ENCOUNTER — Encounter: Payer: Self-pay | Admitting: Family Medicine

## 2016-12-10 VITALS — BP 132/84 | HR 105 | Temp 98.5°F | Ht 61.0 in | Wt 154.0 lb

## 2016-12-10 DIAGNOSIS — M545 Low back pain, unspecified: Secondary | ICD-10-CM

## 2016-12-10 DIAGNOSIS — R059 Cough, unspecified: Secondary | ICD-10-CM

## 2016-12-10 DIAGNOSIS — R911 Solitary pulmonary nodule: Secondary | ICD-10-CM

## 2016-12-10 DIAGNOSIS — R05 Cough: Secondary | ICD-10-CM | POA: Diagnosis not present

## 2016-12-10 MED ORDER — DICLOFENAC SODIUM 1 % TD GEL: 2.0000 g | Freq: Three times a day (TID) | TRANSDERMAL | 0 refills | Status: DC | PRN

## 2016-12-10 MED ORDER — PREDNISONE 20 MG PO TABS: ORAL_TABLET | ORAL | 0 refills | Status: DC

## 2016-12-10 NOTE — Progress Notes (Signed)
Dr. Frederico Hamman T. Rayaan Lorah, MD, [facility] [place], [address] Phone: [phone] Fax: [phone]  12/10/2016  Patient: Judith [name], MRN: [id number], DOB: 1946/06/08, 71 y.o.  Primary Physician:  Loura Pardon, MD   Chief Complaint  Patient presents with  . Back Pain    lower back   Subjective:   Judith [name] is a 71 y.o. very pleasant female patient who presents with the following:  Low back: she has been having increased back pain over the last 1-2 weeks primarily in the lumbar spine. She thinks that she aggravated this was she was coughing quite a bit. At baseline, the patient also does have some back pain, but she has worked diligently to improve her strength, and does not have any trochanteric bursitis and is increase her leg strength considerably as well as her core strength.  Lung nodules - questions, CT surgery  She has questions regarding this. See radiology reports below. She had abnormal chest x-ray and CT scan of her chest after she saw my partner. He ultimately referred to cardiothoracic surgery. Significant pulmonary nodules were seen on CT of the chest. Radiology recommended PET/CT. At this point, cardiothoracic surgery recommended surveillance with recheck CT in 6 weeks. The patient wanted to discuss this further.  Past Medical History, Surgical History, Social History, Family History, Problem List, Medications, and Allergies have been reviewed and updated if relevant.  Patient Active Problem List   Diagnosis Date Noted  . Cough 11/16/2016  . Rash and nonspecific skin eruption 03/16/2016  . Routine general medical examination at a health care facility 01/06/2016  . IBS (irritable colon syndrome) 05/31/2015  . Colon polyps 05/31/2015  . Trochanteric bursitis of both hips 05/31/2015  . Estrogen deficiency 12/05/2014  . Lower abdominal pain 09/26/2014  . Low back pain 09/26/2014  . Encounter  for Medicare annual wellness exam 09/24/2013  . Enlarged lymph node 03/07/2013  . Sarcoid ([place]) 08/09/2012  . Mixed incontinence urge and stress 03/16/2012  . ALOPECIA 11/14/2010  . GERD 01/24/2009  . NEOPLASM, MALIGNANT, BREAST, HX OF 01/02/2009  . Hyperlipidemia 11/17/2007  . VAGINITIS, ATROPHIC, SYMPTOMATIC 11/17/2007  . Osteopenia 11/17/2007  . TINNITUS 09/07/2007  . ALLERGIC RHINITIS 09/07/2007  . DIVERTICULOSIS, COLON 09/07/2007  . Fatty liver 09/07/2007  . History of diverticulitis 08/12/2007    Past Medical History:  Diagnosis Date  . Allergic rhinitis   . Allergy   . Arthritis   . Atrophic vaginitis   . Breast cancer ([place])    Right mastectomy  . Diverticulosis   . GERD (gastroesophageal reflux disease)   . H/O: pneumonia    as child-viral pneumonia  . History of shingles   . HLD (hyperlipidemia)    pt denies  . IBS (irritable bowel syndrome)   . Osteopenia   . Sliding hiatal hernia     Past Surgical History:  Procedure Laterality Date  . APPENDECTOMY    . BASAL CELL CARCINOMA EXCISION     scalp  . BREAST LUMPECTOMY    . COLONOSCOPY  02/2010   divertics, rpt 5 years  . DEXA  2005   osteopenia, no change from 2003  . DILATION AND CURETTAGE OF UTERUS  10/1999  . LAPAROSCOPIC TOTAL HYSTERECTOMY  10/03   for fibroids/polyp  . MASTECTOMY  7/10   right  . POLYPECTOMY    . TOTAL ABDOMINAL HYSTERECTOMY  2003   for fibroids/polyps    Social History  Social History  . Marital status: Married    Spouse name: N/A  . Number of children: 2  . Years of education: N/A   Occupational History  . Retired    Social History Main Topics  . Smoking status: Former Smoker    Quit date: 08/31/1968  . Smokeless tobacco: Never Used  . Alcohol use 0.0 oz/week     Comment: 1-2 glasses of wine/weekly  . Drug use: No  . Sexual activity: Yes   Other Topics Concern  . Not on file   Social History Narrative   Married      2 sons      Retired      2 cups  coffee/daily      Gym 3-4 times/week    Family History  Problem Relation Age of Onset  . Prostate cancer Father   . Heart attack Paternal Grandmother   . Prostate cancer Brother   . Lymphoma Brother   . Stomach cancer Maternal Grandfather   . Colon cancer Neg Hx   . Rectal cancer Neg Hx   . Esophageal cancer Neg Hx     Allergies  Allergen Reactions  . Meloxicam     REACTION: rash  . Niacin And Related Other (See Comments)    Caused black and blue marks all over body    Medication list reviewed and updated in full in [facility].  GEN: No fevers, chills. Nontoxic. Primarily MSK c/o today. MSK: Detailed in the HPI GI: tolerating PO intake without difficulty Neuro: No numbness, parasthesias, or tingling associated. Otherwise the pertinent positives of the ROS are noted above.   Objective:   BP 132/84   Pulse (!) 105   Temp 98.5 F (36.9 C) (Oral)   Ht 5' 1"  (1.549 m)   Wt 154 lb (69.9 kg)   BMI 29.10 kg/m    GEN: A and O x 3. WDWN. NAD.    ENT: Nose clear, ext NML.  No LAD.  No JVD.  TM's clear. Oropharynx clear.  PULM: Normal WOB, no distress. No crackles, wheezes, rhonchi. Decreased bs b CV: RRR, no M/G/R, No rubs, No JVD.   EXT: warm and well-perfused, No c/c/e. PSYCH: Pleasant and conversant.  Neurovascularly intact in bilateral lower extremities with 5/5 strength throughout.  She has had tenderness from L1-S1 bilaterally in the erector spinae complex. Straight leg raise is negative.  Radiology: Dg Chest 2 View  Result Date: 11/16/2016 CLINICAL DATA:  Cough for 3 weeks. EXAM: CHEST  2 VIEW COMPARISON:  Radiographs of September 20, 2012. FINDINGS: The heart size and mediastinal contours are within normal limits. No pneumothorax or pleural effusion is noted. Left lung is clear. Nodular density is again noted peripherally in right upper lobe which appears to be slightly increased in size compared to prior exam. The visualized skeletal structures are  unremarkable. IMPRESSION: Nodular density seen peripherally in right upper lobe which appears to be slightly increased in size compared to prior exam. This is concerning for possible neoplasm. CT scan of the chest is recommended for further evaluation. These results will be called to the ordering clinician or representative by the Radiologist Assistant, and communication documented in the PACS or zVision Dashboard. Electronically Signed   By: Marijo Conception, M.D.   On: 11/16/2016 14:54   Ct Chest Wo Contrast  Result Date: 11/25/2016 CLINICAL DATA:  Indeterminate nodular opacity in the right lung on recent chest radiograph performed for productive cough. Remote history of right breast  cancer. Remote smoking history. EXAM: CT CHEST WITHOUT CONTRAST TECHNIQUE: Multidetector CT imaging of the chest was performed following the standard protocol without IV contrast. COMPARISON:  11/16/2016 chest radiograph. FINDINGS: Cardiovascular: Normal heart size. No significant pericardial fluid/thickening. Atherosclerotic nonaneurysmal thoracic aorta. Normal caliber pulmonary arteries. Mediastinum/Nodes: No discrete thyroid nodules. Unremarkable esophagus. No axillary adenopathy. Mildly enlarged 1.1 cm subcarinal node (series 2/ image 63). No additional pathologically enlarged mediastinal or gross hilar nodes on this noncontrast scan. Lungs/Pleura: No pneumothorax. No pleural effusion. There are several irregular subsolid pulmonary nodules scattered throughout both lungs, largest 2.3 x 1.4 cm in the peripheral right upper lobe (series 3/ image 53), 2.4 x 1.6 cm in the anterior right lower lobe (series 3/ image 81) and 2.5 x 2.1 cm in the medial left upper lobe (series 3/ image 62). There are a few scattered solid pulmonary nodules in the left upper lobe measuring up to the 0.9 cm (series 3/ image 43). Upper abdomen: Mild diffuse hepatic steatosis. Musculoskeletal: No aggressive appearing focal osseous lesions. Moderate thoracic  spondylosis. Right mastectomy. IMPRESSION: 1. Several irregular subsolid pulmonary nodules scattered throughout both lungs, largest 2.5 cm in the left upper lobe. The nodular opacity described in the right lung on the recent chest radiograph correlates with a subsolid 2.3 cm peripheral right upper lobe pulmonary nodule, which has apparently increased compared to a 09/20/2012 chest radiograph. Additional solid left upper lobe pulmonary nodules measuring up to 0.9 cm. Multilobar pulmonary neoplasm is not excluded, although the differential includes infection. Consider further evaluation with PET-CT. 2. Mild subcarinal lymphadenopathy, nonspecific. 3. Aortic atherosclerosis. 4. Mild diffuse hepatic steatosis. Electronically Signed   By: Ilona Sorrel M.D.   On: 11/25/2016 08:26    Assessment and Plan:   Bilateral low back pain without sciatica, unspecified chronicity  Pulmonary nodule  Cough  No significant concerns regarding musculoskeletal back pain.  Ongoing cough with abnormal chest x-ray and CT of the chest. Ongoing cardiothoracic workup. I reassured the patient that rationale seemed very reasonable and appropriate in a patient who has sarcoidosis and had active lung infection. Increase prolonged course of prednisone for lungs, which will also likely help the patient's back.  Ultimately I explained to her that I would need to defer to either CT surgery or pulmonology as much of the above is out of the scope of my practice.  Follow-up: No Follow-up on file.  Meds ordered this encounter  Medications  . predniSONE (DELTASONE) 20 MG tablet    Sig: 2 tabs po for 5 days, then 1 tab po for 5 days    Dispense:  15 tablet    Refill:  0   Medications Discontinued During This Encounter  Medication Reason  . predniSONE (DELTASONE) 20 MG tablet    Signed,  Hannah Crill T. Caton Popowski, MD   Allergies as of 12/10/2016      Reactions   Meloxicam    REACTION: rash   Niacin And Related Other (See  Comments)   Caused black and blue marks all over body      Medication List       Accurate as of 12/10/16  2:53 PM. Always use your most recent med list.          benzonatate 200 MG capsule Commonly known as:  TESSALON Take 1 capsule (200 mg total) by mouth 3 (three) times daily as needed.   calcium-vitamin D 500-200 MG-UNIT tablet Commonly known as:  OSCAL WITH D Take 1 tablet by mouth daily.  CENTRUM SILVER ULTRA WOMENS PO Take by mouth.   diclofenac sodium 1 % Gel Commonly known as:  VOLTAREN Apply 2 g topically 3 (three) times daily as needed.   doxycycline 100 MG tablet Commonly known as:  VIBRA-TABS Take 1 tablet (100 mg total) by mouth 2 (two) times daily.   fish oil-omega-3 fatty acids 1000 MG capsule Take 1 g by mouth daily.   HYDROcodone-homatropine 5-1.5 MG/5ML syrup Commonly known as:  HYCODAN Take 5 mLs by mouth every 8 (eight) hours as needed for cough (sedation caution).   methocarbamol 500 MG tablet Commonly known as:  ROBAXIN TAKE ONE TABLET BY MOUTH EVERY EIGHT HOURS AS NEEDED FOR MUSCLE SPASMS   omeprazole 20 MG capsule Commonly known as:  PRILOSEC TAKE ONE CAPSULE BY MOUTH DAILY AS NEEDED   predniSONE 20 MG tablet Commonly known as:  DELTASONE 2 tabs po for 5 days, then 1 tab po for 5 days

## 2016-12-10 NOTE — Progress Notes (Signed)
Pre visit review using our clinic review tool, if applicable. No additional management support is needed unless otherwise documented below in the visit note. 

## 2016-12-17 IMAGING — MG MM DIGITAL SCREENING UNILAT*L* W/ TOMO W/ CAD
6 series · 6 of 14 positions shown · non-contrast
Comparison: Previous exam(s).

CLINICAL DATA: Screening.

EXAM:
2D DIGITAL SCREENING UNILATERAL LEFT MAMMOGRAM WITH CAD AND ADJUNCT
TOMO

[L CC synth-2D]
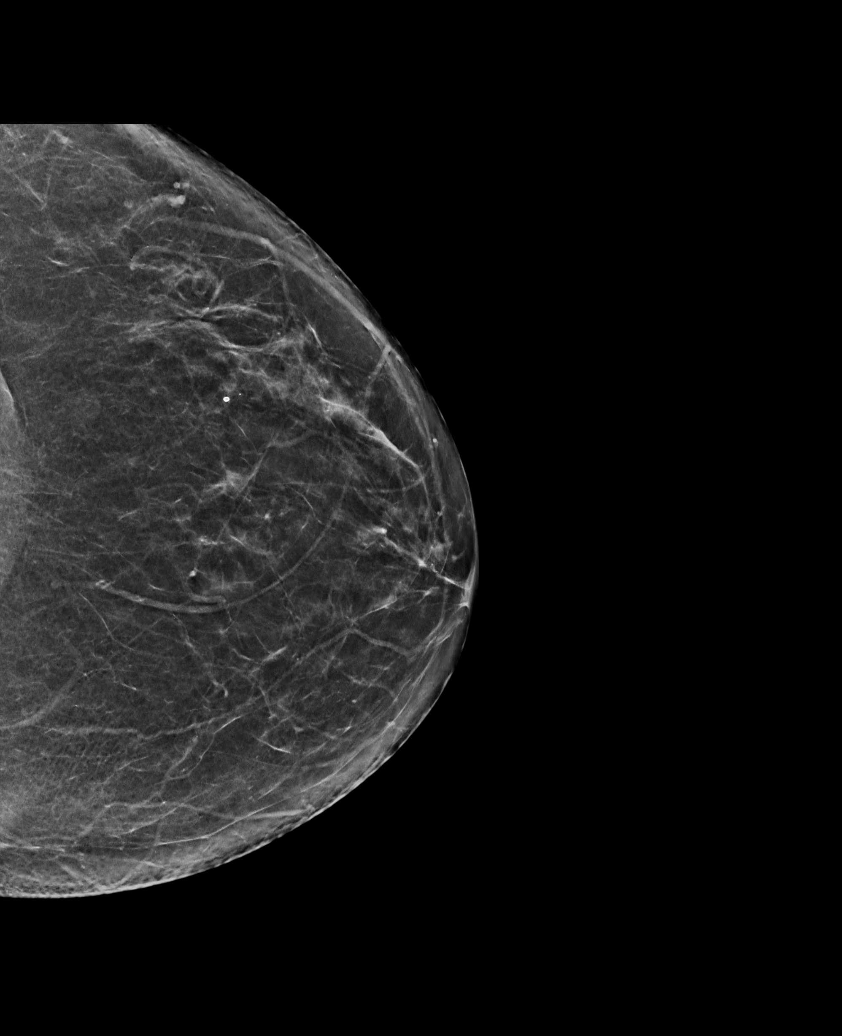

[L MLO synth-2D]
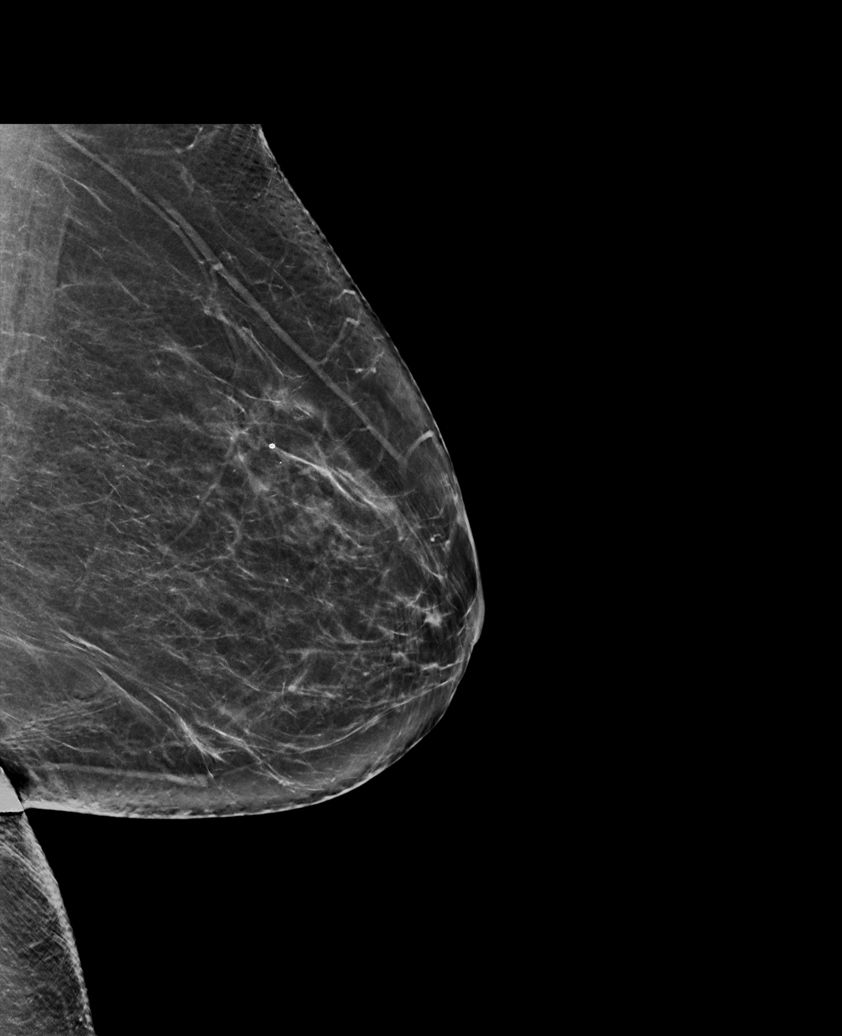

[L MLO]
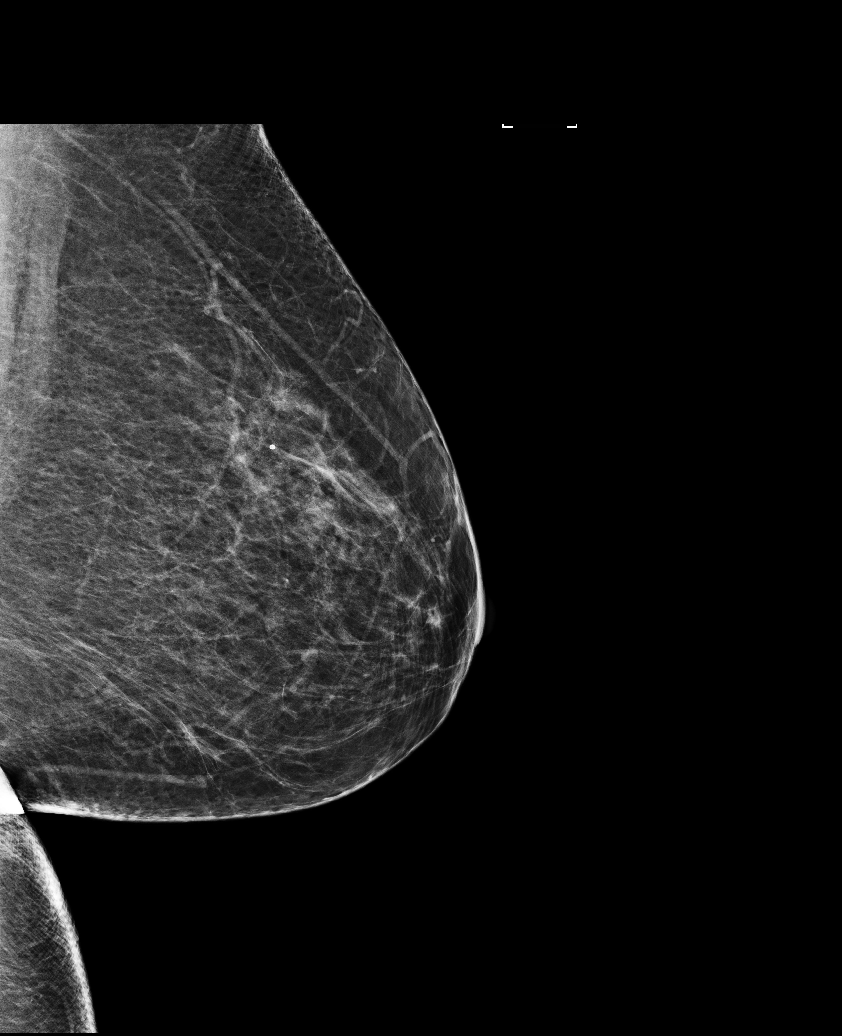

[L CC]
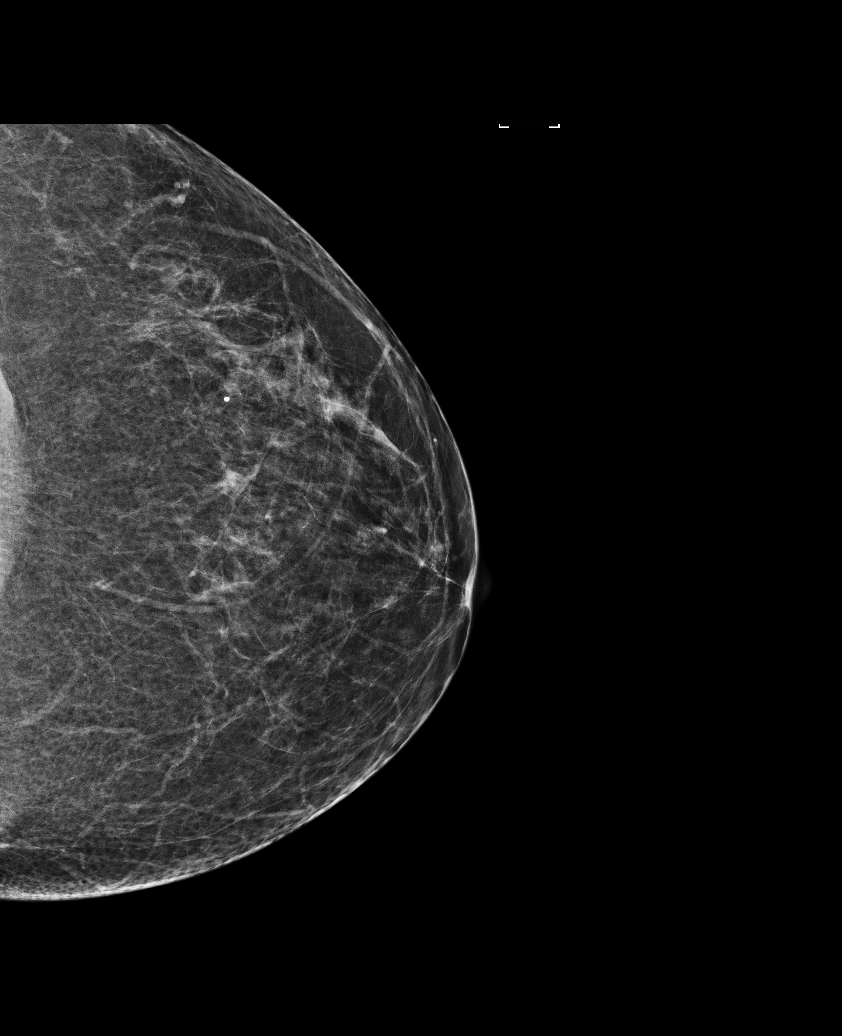

[L MLO tomo · tomo slice 37/73.0]
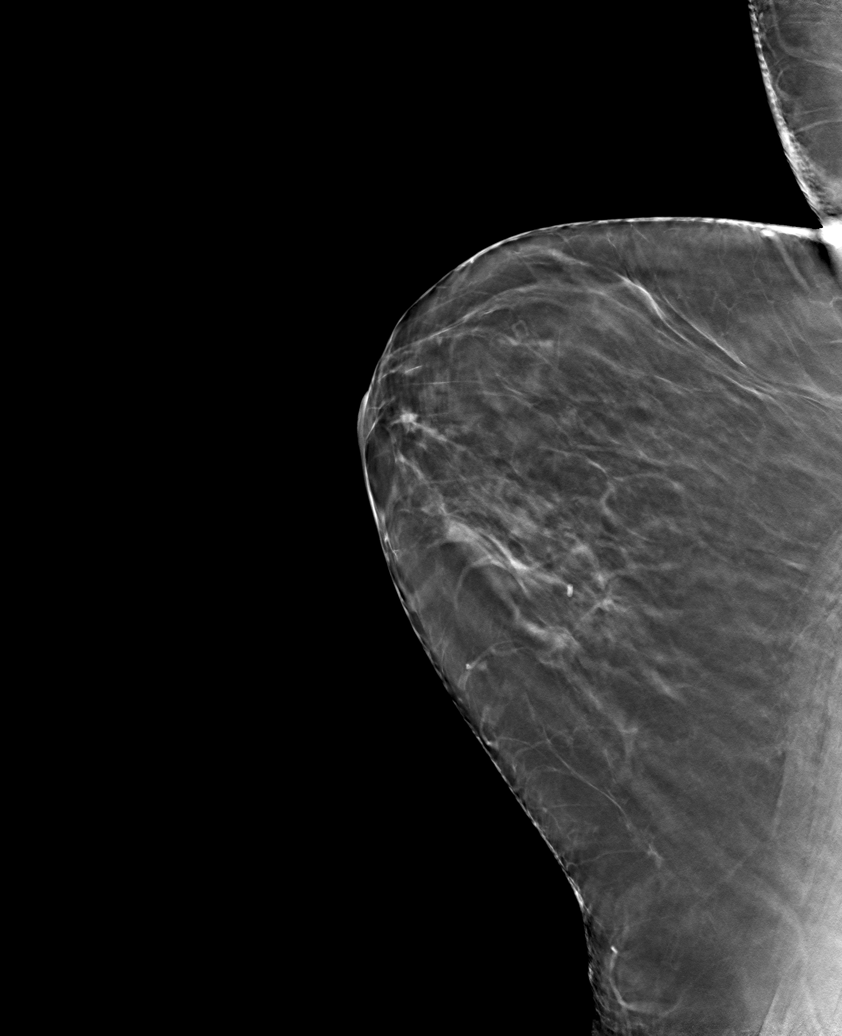

[L CC tomo · tomo slice 33/65.0]
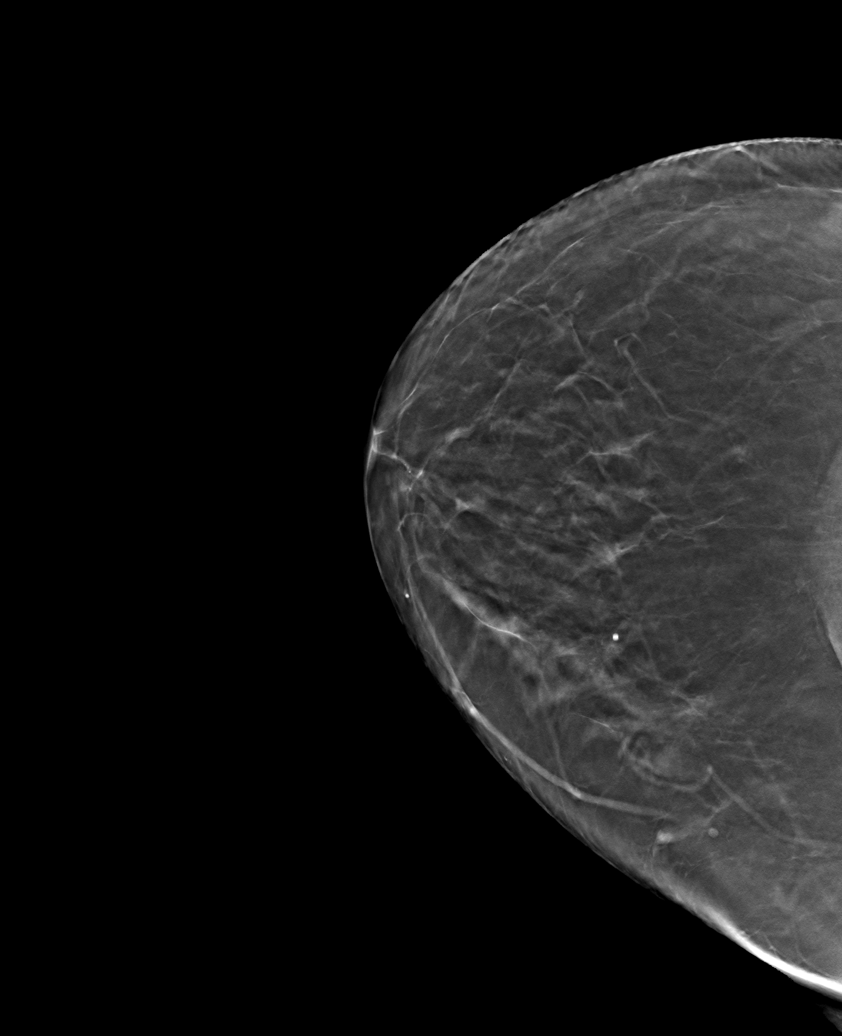

[6 of 14 positions shown; findings below may reference images not displayed]

ACR Breast Density Category b: There are scattered areas of
fibroglandular density.
FINDINGS: There are no findings suspicious for malignancy. Images were
processed with CAD.
IMPRESSION: No mammographic evidence of malignancy. A result letter of this
screening mammogram will be mailed directly to the patient.

RECOMMENDATION:
Screening mammogram in one year. (Code:ED-0-97R)

BI-RADS CATEGORY  1: Negative.

## 2016-12-20 NOTE — Telephone Encounter (Signed)
-----   Message from Eustace Pen, LPN sent at 2/95/2841  3:38 PM EDT ----- Regarding: Labs 4/25 Please place lab orders.   [employer]   Thank you.

## 2016-12-23 ENCOUNTER — Ambulatory Visit (INDEPENDENT_AMBULATORY_CARE_PROVIDER_SITE_OTHER): Payer: Medicare HMO

## 2016-12-23 VITALS — BP 110/80 | HR 94 | Temp 98.3°F | Ht 60.0 in | Wt 153.8 lb

## 2016-12-23 DIAGNOSIS — Z0000 Encounter for general adult medical examination without abnormal findings: Secondary | ICD-10-CM

## 2016-12-23 LAB — CBC WITH DIFFERENTIAL/PLATELET
BASOS ABS: 0 10*3/uL (ref 0.0–0.1)
Basophils Relative: 0.4 % (ref 0.0–3.0)
EOS ABS: 0.2 10*3/uL (ref 0.0–0.7)
Eosinophils Relative: 2.6 % (ref 0.0–5.0)
HCT: 46.4 % — ABNORMAL HIGH (ref 36.0–46.0)
Hemoglobin: 15.3 g/dL — ABNORMAL HIGH (ref 12.0–15.0)
LYMPHS PCT: 19.9 % (ref 12.0–46.0)
Lymphs Abs: 1.9 10*3/uL (ref 0.7–4.0)
MCHC: 32.9 g/dL (ref 30.0–36.0)
MCV: 93.1 fl (ref 78.0–100.0)
MONOS PCT: 6.1 % (ref 3.0–12.0)
Monocytes Absolute: 0.6 10*3/uL (ref 0.1–1.0)
NEUTROS ABS: 6.6 10*3/uL (ref 1.4–7.7)
NEUTROS PCT: 71 % (ref 43.0–77.0)
Platelets: 263 10*3/uL (ref 150.0–400.0)
RBC: 4.99 Mil/uL (ref 3.87–5.11)
RDW: 14 % (ref 11.5–15.5)
WBC: 9.3 10*3/uL (ref 4.0–10.5)

## 2016-12-23 LAB — COMPREHENSIVE METABOLIC PANEL
ALT: 20 U/L (ref 0–35)
AST: 15 U/L (ref 0–37)
Albumin: 3.9 g/dL (ref 3.5–5.2)
Alkaline Phosphatase: 57 U/L (ref 39–117)
BILIRUBIN TOTAL: 0.6 mg/dL (ref 0.2–1.2)
BUN: 18 mg/dL (ref 6–23)
CO2: 29 meq/L (ref 19–32)
CREATININE: 0.85 mg/dL (ref 0.40–1.20)
Calcium: 9.4 mg/dL (ref 8.4–10.5)
Chloride: 104 mEq/L (ref 96–112)
GFR: 69.91 mL/min (ref >60.00)
Glucose, Bld: 93 mg/dL (ref 70–99)
Potassium: 4.1 mEq/L (ref 3.5–5.1)
Sodium: 140 mEq/L (ref 135–145)
TOTAL PROTEIN: 6.7 g/dL (ref 6.0–8.3)

## 2016-12-23 LAB — TSH: TSH: 2.18 u[IU]/mL (ref 0.35–4.50)

## 2016-12-23 LAB — LIPID PANEL
CHOL/HDL RATIO: 4
Cholesterol: 200 mg/dL (ref 0–200)
HDL: 52.7 mg/dL (ref >39.00)
LDL Cholesterol: 119 mg/dL — ABNORMAL HIGH (ref 0–99)
NONHDL: 147.41
Triglycerides: 144 mg/dL (ref 0.0–149.0)
VLDL: 28.8 mg/dL (ref 0.0–40.0)

## 2016-12-23 NOTE — Progress Notes (Signed)
PCP notes:   Health maintenance:  No gaps identified.  Abnormal screenings:   Hearing - failed  Patient concerns:   Pt is concerned about lung nodules.  Nurse concerns:  None  Next PCP appt:   01/06/17 @ 1130  I reviewed health advisor's note, was available for consultation, and agree with documentation and plan. Loura Pardon MD

## 2016-12-23 NOTE — Patient Instructions (Signed)
Judith [name] , Thank you for taking time to come for your Medicare Wellness Visit. I appreciate your ongoing commitment to your health goals. Please review the following plan we discussed and let me know if I can assist you in the future.   These are the goals we discussed: Goals    . Increase physical activity          When tolerated, I will resume Silver Sneakers for 30 min at least 3-4 days per week.        This is a list of the screening recommended for you and due dates:  Health Maintenance  Topic Date Due  .  Hepatitis C: One time screening is recommended by Center for Disease Control  (CDC) for  adults born from [birth year] through 1966.   10/02/2020*  . Flu Shot  03/31/2017  . Mammogram  01/23/2018  . Tetanus Vaccine  03/19/2021  . Colon Cancer Screening  07/28/2025  . DEXA scan (bone density measurement)  Completed  . Pneumonia vaccines  Completed  *Topic was postponed. The date shown is not the original due date.   Preventive Care for Adults  A healthy lifestyle and preventive care can promote health and wellness. Preventive health guidelines for adults include the following key practices.  . A routine yearly physical is a good way to check with your health care provider about your health and preventive screening. It is a chance to share any concerns and updates on your health and to receive a thorough exam.  . Visit your dentist for a routine exam and preventive care every 6 months. Brush your teeth twice a day and floss once a day. Good oral hygiene prevents tooth decay and gum disease.  . The frequency of eye exams is based on your age, health, family medical history, use  of contact lenses, and other factors. Follow your health care provider's ecommendations for frequency of eye exams.  . Eat a healthy diet. Foods like vegetables, fruits, whole grains, low-fat dairy products, and lean protein foods contain the nutrients you need without too many calories. Decrease your  intake of foods high in solid fats, added sugars, and salt. Eat the right amount of calories for you. Get information about a proper diet from your health care provider, if necessary.  . Regular physical exercise is one of the most important things you can do for your health. Most adults should get at least 150 minutes of moderate-intensity exercise (any activity that increases your heart rate and causes you to sweat) each week. In addition, most adults need muscle-strengthening exercises on 2 or more days a week.  Silver Sneakers may be a benefit available to you. To determine eligibility, you may visit the website: www.silversneakers.com or contact program at (929)596-7330 Mon-Fri between 8AM-8PM.   . Maintain a healthy weight. The body mass index (BMI) is a screening tool to identify possible weight problems. It provides an estimate of body fat based on height and weight. Your health care provider can find your BMI and can help you achieve or maintain a healthy weight.   For adults 20 years and older: ? A BMI below 18.5 is considered underweight. ? A BMI of 18.5 to 24.9 is normal. ? A BMI of 25 to 29.9 is considered overweight. ? A BMI of 30 and above is considered obese.   . Maintain normal blood lipids and cholesterol levels by exercising and minimizing your intake of saturated fat. Eat a balanced diet with plenty of  fruit and vegetables. Blood tests for lipids and cholesterol should begin at age 2 and be repeated every 5 years. If your lipid or cholesterol levels are high, you are over 50, or you are at high risk for heart disease, you may need your cholesterol levels checked more frequently. Ongoing high lipid and cholesterol levels should be treated with medicines if diet and exercise are not working.  . If you smoke, find out from your health care provider how to quit. If you do not use tobacco, please do not start.  . If you choose to drink alcohol, please do not consume more than 2  drinks per day. One drink is considered to be 12 ounces (355 mL) of beer, 5 ounces (148 mL) of wine, or 1.5 ounces (44 mL) of liquor.  . If you are 61-66 years old, ask your health care provider if you should take aspirin to prevent strokes.  . Use sunscreen. Apply sunscreen liberally and repeatedly throughout the day. You should seek shade when your shadow is shorter than you. Protect yourself by wearing long sleeves, pants, a wide-brimmed hat, and sunglasses year round, whenever you are outdoors.  . Once a month, do a whole body skin exam, using a mirror to look at the skin on your back. Tell your health care provider of new moles, moles that have irregular borders, moles that are larger than a pencil eraser, or moles that have changed in shape or color.

## 2016-12-23 NOTE — Progress Notes (Signed)
Pre visit review using our clinic review tool, if applicable. No additional management support is needed unless otherwise documented below in the visit note. 

## 2016-12-23 NOTE — Progress Notes (Signed)
Subjective:   Judith [name] is a 71 y.o. female who presents for Medicare Annual (Subsequent) preventive examination.  Review of Systems:  N/A Cardiac Risk Factors include: advanced age (>76mn, >>37women);dyslipidemia;obesity (BMI >30kg/m2)     Objective:     Vitals: BP 110/80 (BP Location: Left Arm, Patient Position: Sitting, Cuff Size: Normal)   Pulse 94   Temp 98.3 F (36.8 C) (Oral)   Ht 5' (1.524 m) Comment: no shoes  Wt 153 lb 12 oz (69.7 kg)   SpO2 100%   BMI 30.03 kg/m   Body mass index is 30.03 kg/m.   Tobacco History  Smoking Status  . Former Smoker  . Quit date: 08/31/1968  Smokeless Tobacco  . Never Used     Counseling given: No   Past Medical History:  Diagnosis Date  . Allergic rhinitis   . Allergy   . Arthritis   . Atrophic vaginitis   . [place]    [place]ctomy  . Diverticulosis   . GERD (gastroesophageal reflux disease)   . H/O: pneumonia    as child-viral pneumonia  . History of shingles   . HLD (hyperlipidemia)    pt denies  . IBS (irritable bowel syndrome)   . Osteopenia   . Sliding hiatal hernia    Past Surgical History:  Procedure Laterality Date  . APPENDECTOMY    . BASAL CELL CARCINOMA EXCISION     scalp  . BASAL CELL CARCINOMA EXCISION  08/2016   nose  . BREAST LUMPECTOMY    . COLONOSCOPY  02/2010   divertics, rpt 5 years  . DEXA  2005   osteopenia, no change from 2003  . DILATION AND CURETTAGE OF UTERUS  10/1999  . LAPAROSCOPIC TOTAL HYSTERECTOMY  10/03   for fibroids/polyp  . MASTECTOMY  7/10   right  . POLYPECTOMY    . TOTAL ABDOMINAL HYSTERECTOMY  2003   for fibroids/polyps   Family History  Problem Relation Age of Onset  . Prostate cancer Father   . Prostate cancer Brother   . Lymphoma Brother   . Heart attack Paternal Grandmother   . Stomach cancer Maternal Grandfather   . Colon cancer Neg Hx   . Rectal cancer Neg Hx   . Esophageal cancer Neg Hx    History  Sexual Activity  .  Sexual activity: Yes    Outpatient Encounter Prescriptions as of 12/23/2016  Medication Sig  . calcium-vitamin D (OSCAL WITH D) 500-200 MG-UNIT per tablet Take 1 tablet by mouth daily.  . diclofenac sodium (VOLTAREN) 1 % GEL Apply 2 g topically 3 (three) times daily as needed.  . fish oil-omega-3 fatty acids 1000 MG capsule Take 1 g by mouth daily.   . methocarbamol (ROBAXIN) 500 MG tablet TAKE ONE TABLET BY MOUTH EVERY EIGHT HOURS AS NEEDED FOR MUSCLE SPASMS  . Multiple Vitamins-Minerals (CENTRUM SILVER ULTRA WOMENS PO) Take by mouth.  .Marland Kitchenomeprazole (PRILOSEC) 20 MG capsule TAKE ONE CAPSULE BY MOUTH DAILY AS NEEDED  . benzonatate (TESSALON) 200 MG capsule Take 1 capsule (200 mg total) by mouth 3 (three) times daily as needed. (Patient not taking: Reported on 12/23/2016)  . HYDROcodone-homatropine (HYCODAN) 5-1.5 MG/5ML syrup Take 5 mLs by mouth every 8 (eight) hours as needed for cough (sedation caution). (Patient not taking: Reported on 12/23/2016)  . [DISCONTINUED] doxycycline (VIBRA-TABS) 100 MG tablet Take 1 tablet (100 mg total) by mouth 2 (two) times daily.  . [DISCONTINUED] predniSONE (DELTASONE) 20 MG tablet  2 tabs po for 5 days, then 1 tab po for 5 days   No facility-administered encounter medications on file as of 12/23/2016.     Activities of Daily Living In your present state of health, do you have any difficulty performing the following activities: 12/23/2016  Hearing? N  Vision? Y  Difficulty concentrating or making decisions? N  Walking or climbing stairs? Y  Dressing or bathing? N  Doing errands, shopping? N  Preparing Food and eating ? N  Using the Toilet? N  In the past six months, have you accidently leaked urine? Y  Do you have problems with loss of bowel control? N  Managing your Medications? N  Managing your Finances? N  Housekeeping or managing your Housekeeping? N  Some recent data might be hidden    Patient Care Team: Abner Greenspan, MD as PCP - General Oneta Rack, MD as Consulting Physician (Dermatology)    Assessment:     Hearing Screening   125Hz  250Hz  500Hz  1000Hz  2000Hz  3000Hz  4000Hz  6000Hz  8000Hz   Right ear:   0 40 40  40    Left ear:   40 40 40  40    Vision Screening Comments: Last vision exam approx. 6 mths ago @ [facility]   Exercise Activities and Dietary recommendations Current Exercise Habits: Home exercise routine;Structured exercise class, Type of exercise: stretching;walking;Other - see comments (fit board, Silver Sneakers), Time (Minutes): 30, Frequency (Times/Week): 3, Weekly Exercise (Minutes/Week): 90, Intensity: Mild, Exercise limited by: orthopedic condition(s)  Goals    . Increase physical activity          When tolerated, I will resume Silver Sneakers for 30 min at least 3-4 days per week.       Fall Risk Fall Risk  12/23/2016 12/12/2015 12/05/2014 10/02/2013  Falls in the past year? No No No Yes  Number falls in past yr: - - - 1  Injury with Fall? - - - No   Depression Screen PHQ 2/9 Scores 12/23/2016 12/12/2015 12/05/2014 10/02/2013  PHQ - 2 Score 0 0 0 0     Cognitive Function MMSE - Mini Mental State Exam 12/23/2016 12/12/2015  Orientation to time 5 5  Orientation to Place 5 5  Registration 3 3  Attention/ Calculation 0 0  Recall 3 3  Language- name 2 objects 0 0  Language- repeat 1 1  Language- follow 3 step command 3 3  Language- read & follow direction 0 0  Write a sentence 0 0  Copy design 0 0  Total score 20 20       PLEASE NOTE: A Mini-Cog screen was completed. Maximum score is 20. A value of 0 denotes this part of [place]stein MMSE was not completed or the patient failed this part of the Mini-Cog screening.   Mini-Cog Screening Orientation to Time - Max 5 pts Orientation to Place - Max 5 pts Registration - Max 3 pts Recall - Max 3 pts Language Repeat - Max 1 pts Language Follow 3 Step Command - Max 3 pts   Immunization History  Administered Date(s) Administered  . Influenza  Split 06/24/2011, 06/08/2012  . Influenza Whole 07/08/2007, 06/11/2008, 06/19/2009, 06/04/2010  . Influenza,inj,Quad PF,36+ Mos 06/15/2013, 06/21/2014, 07/31/2015, 06/10/2016  . Pneumococcal Conjugate-13 12/05/2014  . Pneumococcal Polysaccharide-23 03/20/2011  . Td 06/06/2001  . Tdap 03/20/2011   Screening Tests Health Maintenance  Topic Date Due  . Hepatitis C Screening  10/02/2020 (Originally 1946-06-09)  . INFLUENZA VACCINE  03/31/2017  .  MAMMOGRAM  01/23/2018  . TETANUS/TDAP  03/19/2021  . COLONOSCOPY  07/28/2025  . DEXA SCAN  Completed  . PNA vac Low Risk Adult  Completed      Plan:     I have personally reviewed and addressed the Medicare Annual Wellness questionnaire and have noted the following in the patient's chart:  A. Medical and social history B. Use of alcohol, tobacco or illicit drugs  C. Current medications and supplements D. Functional ability and status E.  Nutritional status F.  Physical activity G. Advance directives H. List of other physicians I.  Hospitalizations, surgeries, and ER visits in previous 12 months J.  [place] to include hearing, vision, cognitive, depression L. Referrals and appointments - none  In addition, I have reviewed and discussed with patient certain preventive protocols, quality metrics, and best practice recommendations. A written personalized care plan for preventive services as well as general preventive health recommendations were provided to patient.  See attached scanned questionnaire for additional information.   Signed,   Lindell Noe, MHA, BS, LPN Health Coach

## 2016-12-24 ENCOUNTER — Encounter: Payer: Self-pay | Admitting: Family Medicine

## 2016-12-24 ENCOUNTER — Ambulatory Visit (INDEPENDENT_AMBULATORY_CARE_PROVIDER_SITE_OTHER): Payer: Medicare HMO | Admitting: Family Medicine

## 2016-12-24 VITALS — BP 100/70 | HR 119 | Temp 99.4°F | Ht 60.0 in | Wt 155.2 lb

## 2016-12-24 DIAGNOSIS — K5732 Diverticulitis of large intestine without perforation or abscess without bleeding: Secondary | ICD-10-CM | POA: Diagnosis not present

## 2016-12-24 MED ORDER — AMOXICILLIN-POT CLAVULANATE 875-125 MG PO TABS: 1.0000 | ORAL_TABLET | Freq: Two times a day (BID) | ORAL | 0 refills | Status: AC

## 2016-12-24 NOTE — Progress Notes (Signed)
Dr. Frederico Hamman T. Crytal Pensinger, MD, [facility] [place], [address] Phone: [phone] Fax: [phone]  12/24/2016  Patient: Judith [name], MRN: [id number], DOB: [birth year], 71 y.o.  Primary Physician:  Loura Pardon, MD   Chief Complaint  Patient presents with  . Diverticulitis   Subjective:   Judith [name] is a 71 y.o. very pleasant female patient who presents with the following:  Decreased PO, some fever.  LLQ pain.   Patient with a history of diverticulitis, but she hasn't had any in about 3 years presents with left lower quadrant pain, decreased by mouth intake, mild fever. She also has some generalized nausea.  She doesn't have any cold symptoms, cough, or diarrhea. She did have a bowel movement this morning and it did not contain blood.  Past Medical History, Surgical History, Social History, Family History, Problem List, Medications, and Allergies have been reviewed and updated if relevant.  Patient Active Problem List   Diagnosis Date Noted  . Cough 11/16/2016  . Rash and nonspecific skin eruption 03/16/2016  . Routine general medical examination at a health care facility 01/06/2016  . IBS (irritable colon syndrome) 05/31/2015  . Colon polyps 05/31/2015  . Trochanteric bursitis of both hips 05/31/2015  . Estrogen deficiency 12/05/2014  . Lower abdominal pain 09/26/2014  . Low back pain 09/26/2014  . Encounter for Medicare annual wellness exam 09/24/2013  . Enlarged lymph node 03/07/2013  . Sarcoid 08/09/2012  . Mixed incontinence urge and stress 03/16/2012  . ALOPECIA 11/14/2010  . GERD 01/24/2009  . NEOPLASM, MALIGNANT, BREAST, HX OF 01/02/2009  . Hyperlipidemia 11/17/2007  . VAGINITIS, ATROPHIC, SYMPTOMATIC 11/17/2007  . Osteopenia 11/17/2007  . TINNITUS 09/07/2007  . ALLERGIC RHINITIS 09/07/2007  . DIVERTICULOSIS, COLON 09/07/2007  . Fatty liver 09/07/2007  . History of diverticulitis  08/12/2007    Past Medical History:  Diagnosis Date  . Allergic rhinitis   . Allergy   . Arthritis   . Atrophic vaginitis   . Breast cancer ([place])    Right mastectomy  . Diverticulosis   . GERD (gastroesophageal reflux disease)   . H/O: pneumonia    as child-viral pneumonia  . History of shingles   . HLD (hyperlipidemia)    pt denies  . IBS (irritable bowel syndrome)   . Osteopenia   . Sliding hiatal hernia     Past Surgical History:  Procedure Laterality Date  . APPENDECTOMY    . BASAL CELL CARCINOMA EXCISION     scalp  . BASAL CELL CARCINOMA EXCISION  08/2016   nose  . BREAST LUMPECTOMY    . COLONOSCOPY  02/2010   divertics, rpt 5 years  . DEXA  2005   osteopenia, no change from 2003  . DILATION AND CURETTAGE OF UTERUS  10/1999  . LAPAROSCOPIC TOTAL HYSTERECTOMY  10/03   for fibroids/polyp  . MASTECTOMY  7/10   right  . POLYPECTOMY    . TOTAL ABDOMINAL HYSTERECTOMY  2003   for fibroids/polyps    Social History   Social History  . Marital status: Married    Spouse name: N/A  . Number of children: 2  . Years of education: N/A   Occupational History  . Retired    Social History Main Topics  . Smoking status: Former Smoker    Quit date: 08/31/1968  . Smokeless tobacco: Never Used  . Alcohol use 0.0 oz/week     Comment: 1-2 glasses of wine/weekly  .  Drug use: No  . Sexual activity: Yes   Other Topics Concern  . Not on file   Social History Narrative   Married      2 sons      Retired      2 cups coffee/daily      Gym 3-4 times/week    Family History  Problem Relation Age of Onset  . Prostate cancer Father   . Prostate cancer Brother   . Lymphoma Brother   . Heart attack Paternal Grandmother   . Stomach cancer Maternal Grandfather   . Colon cancer Neg Hx   . Rectal cancer Neg Hx   . Esophageal cancer Neg Hx     Allergies  Allergen Reactions  . Meloxicam     REACTION: rash  . Niacin And Related Other (See Comments)    Caused black  and blue marks all over body    Medication list reviewed and updated in full in [place].  ROS: GEN: Acute illness details above GI: Tolerating PO intake, decreased GU: maintaining adequate hydration and urination Pulm: No SOB Interactive and getting along well at home.  Otherwise, ROS is as per the HPI.  Objective:   BP 100/70   Pulse (!) 119   Temp 99.4 F (37.4 C) (Oral)   Ht 5' (1.524 m)   Wt 155 lb 4 oz (70.4 kg)   BMI 30.32 kg/m   GEN: WDWN, NAD, Non-toxic, A & O x 3 HEENT: Atraumatic, Normocephalic. Neck supple. No masses, No LAD. Ears and Nose: No external deformity. CV: RRR, No M/G/R. No JVD. No thrill. No extra heart sounds. PULM: CTA B, no wheezes, crackles, rhonchi. No retractions. No resp. distress. No accessory muscle use. ABD: S, mild to moderate tenderness, centered in the left lower quadrant, ND, +BS. No rebound. No HSM. EXTR: No c/c/e NEURO Normal gait.  PSYCH: Normally interactive. Conversant. Not depressed or anxious appearing.  Calm demeanor.     Laboratory and Imaging Data:  Assessment and Plan:   Diverticulitis large intestine w/o perforation or abscess w/o bleeding  Presumptive treatment for diverticulitis with by mouth fluids and antibiotics.  Follow-up: if needed  Meds ordered this encounter  Medications  . amoxicillin-clavulanate (AUGMENTIN) 875-125 MG tablet    Sig: Take 1 tablet by mouth 2 (two) times daily.    Dispense:  20 tablet    Refill:  0   Medications Discontinued During This Encounter  Medication Reason  . HYDROcodone-homatropine (HYCODAN) 5-1.5 MG/5ML syrup Completed Course  . benzonatate (TESSALON) 200 MG capsule Completed Course   Signed,  Frederico Hamman T. Enid Maultsby, MD   Allergies as of 12/24/2016      Reactions   Meloxicam    REACTION: rash   Niacin And Related Other (See Comments)   Caused black and blue marks all over body      Medication List       Accurate as of 12/24/16  1:55 PM. Always use your  most recent med list.          amoxicillin-clavulanate 875-125 MG tablet Commonly known as:  AUGMENTIN Take 1 tablet by mouth 2 (two) times daily.   calcium-vitamin D 500-200 MG-UNIT tablet Commonly known as:  OSCAL WITH D Take 1 tablet by mouth daily.   CENTRUM SILVER ULTRA WOMENS PO Take by mouth.   diclofenac sodium 1 % Gel Commonly known as:  VOLTAREN Apply 2 g topically 3 (three) times daily as needed.   fish oil-omega-3 fatty acids 1000  MG capsule Take 1 g by mouth daily.   methocarbamol 500 MG tablet Commonly known as:  ROBAXIN TAKE ONE TABLET BY MOUTH EVERY EIGHT HOURS AS NEEDED FOR MUSCLE SPASMS   omeprazole 20 MG capsule Commonly known as:  PRILOSEC TAKE ONE CAPSULE BY MOUTH DAILY AS NEEDED

## 2016-12-24 NOTE — Progress Notes (Signed)
Pre visit review using our clinic review tool, if applicable. No additional management support is needed unless otherwise documented below in the visit note. 

## 2017-01-06 ENCOUNTER — Encounter: Payer: Self-pay | Admitting: Family Medicine

## 2017-01-06 ENCOUNTER — Ambulatory Visit (INDEPENDENT_AMBULATORY_CARE_PROVIDER_SITE_OTHER): Payer: Medicare HMO | Admitting: Family Medicine

## 2017-01-06 VITALS — BP 112/64 | HR 97 | Temp 98.8°F | Ht 60.0 in | Wt 156.0 lb

## 2017-01-06 DIAGNOSIS — E7800 Pure hypercholesterolemia, unspecified: Secondary | ICD-10-CM | POA: Diagnosis not present

## 2017-01-06 DIAGNOSIS — R05 Cough: Secondary | ICD-10-CM

## 2017-01-06 DIAGNOSIS — Z0000 Encounter for general adult medical examination without abnormal findings: Secondary | ICD-10-CM

## 2017-01-06 DIAGNOSIS — D869 Sarcoidosis, unspecified: Secondary | ICD-10-CM | POA: Diagnosis not present

## 2017-01-06 DIAGNOSIS — R059 Cough, unspecified: Secondary | ICD-10-CM

## 2017-01-06 DIAGNOSIS — D126 Benign neoplasm of colon, unspecified: Secondary | ICD-10-CM

## 2017-01-06 DIAGNOSIS — M8580 Other specified disorders of bone density and structure, unspecified site: Secondary | ICD-10-CM | POA: Diagnosis not present

## 2017-01-06 MED ORDER — OMEPRAZOLE 20 MG PO CPDR: DELAYED_RELEASE_CAPSULE | ORAL | 11 refills | Status: DC

## 2017-01-06 NOTE — Assessment & Plan Note (Signed)
Rev dexa 5 17 No falls or fractures  On ca and D Re check 1 y

## 2017-01-06 NOTE — Progress Notes (Signed)
Subjective:    Patient ID: Judith [name], female    DOB: [birth year], 71 y.o.   MRN: [id number]  HPI Here for health maintenance exam and to review chronic medical problems    Recent tx for diverticulitis   Still has a cough -it is better than it was  Has not noticed much in terms of sarcoid skin symptoms  No longer wheezing  Prednisone made the biggest difference  Sob only on extreme exertion  Can exercise/ walk a mile/ etc   Husband has also had a cold with cough since Jan (on a cruise)   amw was 4/25 No gaps/concerns   Wt Readings from Last 3 Encounters:  01/06/17 156 lb (70.8 kg)  12/24/16 155 lb 4 oz (70.4 kg)  12/23/16 153 lb 12 oz (69.7 kg)  she is not exercising as much with health problems  Tries to eat a healthy diet/works hard at it  bmi 30.4  mammogram 5/17-nl - does not have it scheduled yet  Personal hx of breast cancer  Self breast exam-no lumps or changes   Has had a hysterectomy  No gyn symptoms   Colonoscopy 11/16- adenoma (could not get all the way through)-tortuous colon/ had BE   dexa 5/17 osteopenia with some dec in BMD No falls or fractures Takes her ca and D   Lung nodules seen on recent CT  She saw a surgeon - who favored obs and re imaging rather than biopsy (suspecting acute infx process) Repeat CT planned on 5/29 Has been tx with prednisone and antibiotics    Hx of hyperlipidemia Lab Results  Component Value Date   CHOL 200 12/23/2016   CHOL 141 12/12/2015   CHOL 169 11/28/2014   Lab Results  Component Value Date   HDL 52.70 12/23/2016   HDL 52.30 12/12/2015   HDL 48.10 11/28/2014   Lab Results  Component Value Date   LDLCALC 119 (H) 12/23/2016   LDLCALC 77 12/12/2015   LDLCALC 97 11/28/2014   Lab Results  Component Value Date   TRIG 144.0 12/23/2016   TRIG 60.0 12/12/2015   TRIG 119.0 11/28/2014   Lab Results  Component Value Date   CHOLHDL 4 12/23/2016   CHOLHDL 3 12/12/2015   CHOLHDL 4 11/28/2014    Lab Results  Component Value Date   LDLDIRECT 154.0 03/13/2011   LDLDIRECT 134.4 12/18/2008   LDLDIRECT 148.2 11/17/2007    Eats a healthy diet  No fried food  More shrimp lately   Results for orders placed or performed in visit on 12/23/16  CBC with Differential/Platelet  Result Value Ref Range   WBC 9.3 4.0 - 10.5 K/uL   RBC 4.99 3.87 - 5.11 Mil/uL   Hemoglobin 15.3 (H) 12.0 - 15.0 g/dL   HCT 46.4 (H) 36.0 - 46.0 %   MCV 93.1 78.0 - 100.0 fl   MCHC 32.9 30.0 - 36.0 g/dL   RDW 14.0 11.5 - 15.5 %   Platelets 263.0 150.0 - 400.0 K/uL   Neutrophils Relative % 71.0 43.0 - 77.0 %   Lymphocytes Relative 19.9 12.0 - 46.0 %   Monocytes Relative 6.1 3.0 - 12.0 %   Eosinophils Relative 2.6 0.0 - 5.0 %   Basophils Relative 0.4 0.0 - 3.0 %   Neutro Abs 6.6 1.4 - 7.7 K/uL   Lymphs Abs 1.9 0.7 - 4.0 K/uL   Monocytes Absolute 0.6 0.1 - 1.0 K/uL   Eosinophils Absolute 0.2 0.0 - 0.7 K/uL   Basophils  Absolute 0.0 0.0 - 0.1 K/uL  Comprehensive metabolic panel  Result Value Ref Range   Sodium 140 135 - 145 mEq/L   Potassium 4.1 3.5 - 5.1 mEq/L   Chloride 104 96 - 112 mEq/L   CO2 29 19 - 32 mEq/L   Glucose, Bld 93 70 - 99 mg/dL   BUN 18 6 - 23 mg/dL   Creatinine, Ser 0.85 0.40 - 1.20 mg/dL   Total Bilirubin 0.6 0.2 - 1.2 mg/dL   Alkaline Phosphatase 57 39 - 117 U/L   AST 15 0 - 37 U/L   ALT 20 0 - 35 U/L   Total Protein 6.7 6.0 - 8.3 g/dL   Albumin 3.9 3.5 - 5.2 g/dL   Calcium 9.4 8.4 - 10.5 mg/dL   GFR 69.91 >60.00 mL/min  Lipid panel  Result Value Ref Range   Cholesterol 200 0 - 200 mg/dL   Triglycerides 144.0 0.0 - 149.0 mg/dL   HDL 52.70 >39.00 mg/dL   VLDL 28.8 0.0 - 40.0 mg/dL   LDL Cholesterol 119 (H) 0 - 99 mg/dL   Total CHOL/HDL Ratio 4    NonHDL 147.41   TSH  Result Value Ref Range   TSH 2.18 0.35 - 4.50 uIU/mL      Patient Active Problem List   Diagnosis Date Noted  . Cough 11/16/2016  . Routine general medical examination at a health care facility  01/06/2016  . IBS (irritable colon syndrome) 05/31/2015  . Colon polyps 05/31/2015  . Trochanteric bursitis of both hips 05/31/2015  . Estrogen deficiency 12/05/2014  . Low back pain 09/26/2014  . Encounter for Medicare annual wellness exam 09/24/2013  . Enlarged lymph node 03/07/2013  . Sarcoid 08/09/2012  . Mixed incontinence urge and stress 03/16/2012  . ALOPECIA 11/14/2010  . GERD 01/24/2009  . NEOPLASM, MALIGNANT, BREAST, HX OF 01/02/2009  . Hyperlipidemia 11/17/2007  . VAGINITIS, ATROPHIC, SYMPTOMATIC 11/17/2007  . Osteopenia 11/17/2007  . TINNITUS 09/07/2007  . ALLERGIC RHINITIS 09/07/2007  . DIVERTICULOSIS, COLON 09/07/2007  . Fatty liver 09/07/2007  . History of diverticulitis 08/12/2007   Past Medical History:  Diagnosis Date  . Allergic rhinitis   . Allergy   . Arthritis   . Atrophic vaginitis   . Breast cancer ([place])    Right mastectomy  . Diverticulosis   . GERD (gastroesophageal reflux disease)   . H/O: pneumonia    as child-viral pneumonia  . History of shingles   . HLD (hyperlipidemia)    pt denies  . IBS (irritable bowel syndrome)   . Osteopenia   . Sliding hiatal hernia    Past Surgical History:  Procedure Laterality Date  . APPENDECTOMY    . BASAL CELL CARCINOMA EXCISION     scalp  . BASAL CELL CARCINOMA EXCISION  08/2016   nose  . BREAST LUMPECTOMY    . COLONOSCOPY  02/2010   divertics, rpt 5 years  . DEXA  2005   osteopenia, no change from 2003  . DILATION AND CURETTAGE OF UTERUS  10/1999  . LAPAROSCOPIC TOTAL HYSTERECTOMY  10/03   for fibroids/polyp  . MASTECTOMY  7/10   right  . POLYPECTOMY    . TOTAL ABDOMINAL HYSTERECTOMY  2003   for fibroids/polyps   Social History  Substance Use Topics  . Smoking status: Former Smoker    Quit date: 08/31/1968  . Smokeless tobacco: Never Used  . Alcohol use 0.0 oz/week     Comment: 1-2 glasses of wine/weekly   Family History  Problem Relation Age of Onset  . Prostate cancer Father   .  Prostate cancer Brother   . Lymphoma Brother   . Heart attack Paternal Grandmother   . Stomach cancer Maternal Grandfather   . Colon cancer Neg Hx   . Rectal cancer Neg Hx   . Esophageal cancer Neg Hx    Allergies  Allergen Reactions  . Meloxicam     REACTION: rash  . Niacin And Related Other (See Comments)    Caused black and blue marks all over body   Current Outpatient Prescriptions on File Prior to Visit  Medication Sig Dispense Refill  . calcium-vitamin D (OSCAL WITH D) 500-200 MG-UNIT per tablet Take 1 tablet by mouth daily.    . diclofenac sodium (VOLTAREN) 1 % GEL Apply 2 g topically 3 (three) times daily as needed. 1 Tube 0  . fish oil-omega-3 fatty acids 1000 MG capsule Take 1 g by mouth daily.     . methocarbamol (ROBAXIN) 500 MG tablet TAKE ONE TABLET BY MOUTH EVERY EIGHT HOURS AS NEEDED FOR MUSCLE SPASMS 30 tablet 1  . Multiple Vitamins-Minerals (CENTRUM SILVER ULTRA WOMENS PO) Take by mouth.     No current facility-administered medications on file prior to visit.     Review of Systems Review of Systems  Constitutional: Negative for fever, appetite change, fatigue and unexpected weight change.  Eyes: Negative for pain and visual disturbance.  Respiratory: Negative for wheeze and shortness of breath.  pos for ongoing cough  Cardiovascular: Negative for cp or palpitations    Gastrointestinal: Negative for nausea, diarrhea and constipation. (abd pain is resolved) Genitourinary: Negative for urgency and frequency.  Skin: Negative for pallor or rash   Neurological: Negative for weakness, light-headedness, numbness and headaches.  Hematological: Negative for adenopathy. Does not bruise/bleed easily.  Psychiatric/Behavioral: Negative for dysphoric mood. The patient is not nervous/anxious.         Objective:   Physical Exam  Constitutional: She appears well-developed and well-nourished. No distress.  overwt and wwll appearing   HENT:  Head: Normocephalic and  atraumatic.  Right Ear: External ear normal.  Left Ear: External ear normal.  Mouth/Throat: Oropharynx is clear and moist.  Eyes: Conjunctivae and EOM are normal. Pupils are equal, round, and reactive to light. No scleral icterus.  Neck: Normal range of motion. Neck supple. No JVD present. Carotid bruit is not present. No thyromegaly present.  Cardiovascular: Normal rate, regular rhythm, normal heart sounds and intact distal pulses.  Exam reveals no gallop.   Pulmonary/Chest: Effort normal and breath sounds normal. No respiratory distress. She has no wheezes. She exhibits no tenderness.  occ harsh cough Good air exch No wheezing or rales or crackles   Abdominal: Soft. Bowel sounds are normal. She exhibits no distension, no abdominal bruit and no mass. There is no tenderness.  Genitourinary: No breast swelling, tenderness, discharge or bleeding.  Genitourinary Comments: s/p R mastectomy  Breast exam left: No mass, nodules, thickening, tenderness, bulging, retraction, inflamation, nipple discharge or skin changes noted.  No axillary or clavicular LA.    R axilla is clear or masses or skin change  Musculoskeletal: Normal range of motion. She exhibits no edema or tenderness.  Lymphadenopathy:    She has no cervical adenopathy.  Neurological: She is alert. She has normal reflexes. No cranial nerve deficit. She exhibits normal muscle tone. Coordination normal.  Skin: Skin is warm and dry. No rash noted. No erythema. No pallor.  Solar lentigines diffusely   Psychiatric:  She has a normal mood and affect.          Assessment & Plan:   Problem List Items Addressed This Visit      Digestive   Colon polyps    Attempted colonoscopy 2016- tortuous colon  Will consider cologuard at 82 y mark        Musculoskeletal and Integument   Osteopenia    Rev dexa 5 17 No falls or fractures  On ca and D Re check 1 y        Other   Cough    Ongoing with pulmonary nodules/lesions on CT Has  been tx with prednisone and abx times 2 Improved but not resolved  Re check of CT is 5/29 and surgeon f/u after that ? If will need bronchoscopy or bx at that time She continues f/u of GERD also  Also hx of sarcoid (skin) not treated currently      Hyperlipidemia    Disc goals for lipids and reasons to control them Rev labs with pt Rev low sat fat diet in detail LDL is up -disc dietary changes       Routine general medical examination at a health care facility - Primary    Reviewed health habits including diet and exercise and skin cancer prevention Reviewed appropriate screening tests for age  Also reviewed health mt list, fam hx and immunization status , as well as social and family history   See HPI AMW rev Labs rev  Disc health habits Disc plan for upcoming CT of chest  She will schedule her own mammogram        Sarcoid    Inv skin  Has side eff from plaquenil -no longer treated  Settled down on its own  Had some new lung findings- being addressed by surgeon  Will continue to follow

## 2017-01-06 NOTE — Assessment & Plan Note (Signed)
Reviewed health habits including diet and exercise and skin cancer prevention Reviewed appropriate screening tests for age  Also reviewed health mt list, fam hx and immunization status , as well as social and family history   See HPI AMW rev Labs rev  Disc health habits Disc plan for upcoming CT of chest  She will schedule her own mammogram

## 2017-01-06 NOTE — Assessment & Plan Note (Signed)
Ongoing with pulmonary nodules/lesions on CT Has been tx with prednisone and abx times 2 Improved but not resolved  Re check of CT is 5/29 and surgeon f/u after that ? 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

## 2017-01-06 NOTE — Patient Instructions (Addendum)
You are due for a mammogram in June- don't forget to schedule it   For cholesterol   Avoid red meat/ fried foods/ egg yolks/ fatty breakfast meats/ butter, cheese and high fat dairy/ and shellfish    Take care of yourself  Stay as active as you can be  Drink your water  Elevate feet when sitting   Follow up for CT and surgeon as planned

## 2017-01-06 NOTE — Progress Notes (Signed)
Pre visit review using our clinic review tool, if applicable. No additional management support is needed unless otherwise documented below in the visit note. 

## 2017-01-06 NOTE — Assessment & Plan Note (Signed)
Disc goals for lipids and reasons to control them Rev labs with pt Rev low sat fat diet in detail LDL is up -disc dietary changes

## 2017-01-06 NOTE — Assessment & Plan Note (Signed)
Inv skin  Has side eff from plaquenil -no longer treated  Settled down on its own  Had some new lung findings- being addressed by surgeon  Will continue to follow

## 2017-01-06 NOTE — Assessment & Plan Note (Signed)
Attempted colonoscopy 2016- tortuous colon  Will consider cologuard at 4 y mark

## 2017-01-11 DIAGNOSIS — H01003 Unspecified blepharitis right eye, unspecified eyelid: Secondary | ICD-10-CM | POA: Diagnosis not present

## 2017-01-26 ENCOUNTER — Other Ambulatory Visit: Payer: Self-pay | Admitting: *Deleted

## 2017-01-26 ENCOUNTER — Ambulatory Visit
Admission: RE | Admit: 2017-01-26 | Discharge: 2017-01-26 | Disposition: A | Discharge status: Home / Self Care | Payer: Medicare HMO | Source: Ambulatory Visit | Attending: Thoracic Surgery (Cardiothoracic Vascular Surgery) | Admitting: Thoracic Surgery (Cardiothoracic Vascular Surgery)

## 2017-01-26 ENCOUNTER — Encounter: Payer: Self-pay | Admitting: Thoracic Surgery (Cardiothoracic Vascular Surgery)

## 2017-01-26 ENCOUNTER — Ambulatory Visit (INDEPENDENT_AMBULATORY_CARE_PROVIDER_SITE_OTHER): Payer: Medicare HMO | Admitting: Thoracic Surgery (Cardiothoracic Vascular Surgery)

## 2017-01-26 VITALS — BP 156/95 | HR 88 | Resp 20 | Ht 60.0 in | Wt 157.0 lb

## 2017-01-26 DIAGNOSIS — R918 Other nonspecific abnormal finding of lung field: Secondary | ICD-10-CM

## 2017-01-26 DIAGNOSIS — Z853 Personal history of malignant neoplasm of breast: Secondary | ICD-10-CM | POA: Diagnosis not present

## 2017-01-26 NOTE — Progress Notes (Signed)
[name]       [place] [address]             [id number]    HPI: Judith [name] returns for follow-up of multiple pulmonary "nodules"  Judith [name] 71 year old woman with a past medical history of breast cancer in 2001 with recurrence in 2010, hiatal hernia, gastroesophageal reflux, hyperlipidemia, arthritis, irritable bowel syndrome, osteopenia, and cutaneous sarcoidosis. She has no prior evidence of pulmonary sarcoidosis other than a report of a tiny lung nodule noted on chest x-rays dating back to 2014.  Earlier this year she developed acute onset of cough and wheezing. She had been working in her yard the day before. She was treated with antibiotics and a short course of prednisone. Her cough improved but did not completely resolve. A CT of the chest showed multiple pulmonary abnormalities including some appeared nodular and others were more groundglass opacities or infiltrative in appearance. She now returns with a short interval CT to reevaluate those nodules.  In the interim since her last visit her cough persists. It has not really changed in character. It occasionally productive of clear sputum. No hemoptysis. Occasional wheezing. No significant shortness of breath. She has complained of some irritation of her right eye as well. Past Medical History:  Diagnosis Date  . Allergic rhinitis   . Allergy   . Arthritis   . Atrophic vaginitis   . Breast cancer ([place])    Right mastectomy  . Diverticulosis   . GERD (gastroesophageal reflux disease)   . H/O: pneumonia    as child-viral pneumonia  . History of shingles   . HLD (hyperlipidemia)    pt denies  . IBS (irritable bowel syndrome)   . Osteopenia   . Sliding hiatal hernia    Past Surgical History:  Procedure Laterality Date  . APPENDECTOMY    . BASAL CELL CARCINOMA EXCISION     scalp  . BASAL CELL CARCINOMA EXCISION  08/2016   nose  . BREAST LUMPECTOMY    . COLONOSCOPY  02/2010   divertics, rpt  5 years  . DEXA  2005   osteopenia, no change from 2003  . DILATION AND CURETTAGE OF UTERUS  10/1999  . LAPAROSCOPIC TOTAL HYSTERECTOMY  10/03   for fibroids/polyp  . MASTECTOMY  7/10   right  . POLYPECTOMY    . TOTAL ABDOMINAL HYSTERECTOMY  2003   for fibroids/polyps     Current Outpatient Prescriptions  Medication Sig Dispense Refill  . calcium-vitamin D (OSCAL WITH D) 500-200 MG-UNIT per tablet Take 1 tablet by mouth daily.    . diclofenac sodium (VOLTAREN) 1 % GEL Apply 2 g topically 3 (three) times daily as needed. 1 Tube 0  . fish oil-omega-3 fatty acids 1000 MG capsule Take 1 g by mouth daily.     . methocarbamol (ROBAXIN) 500 MG tablet TAKE ONE TABLET BY MOUTH EVERY EIGHT HOURS AS NEEDED FOR MUSCLE SPASMS 30 tablet 1  . Multiple Vitamins-Minerals (CENTRUM SILVER ULTRA WOMENS PO) Take by mouth.    Marland Kitchen omeprazole (PRILOSEC) 20 MG capsule TAKE ONE CAPSULE BY MOUTH DAILY AS NEEDED 30 capsule 11   No current facility-administered medications for this visit.    Family History  Problem Relation Age of Onset  . Prostate cancer Father   . Prostate cancer Brother   . Lymphoma Brother   . Heart attack Paternal Grandmother   . Stomach cancer Maternal Grandfather   . Colon cancer Neg Hx   .  Rectal cancer Neg Hx   . Esophageal cancer Neg Hx    Social History   Social History  . Marital status: Married    Spouse name: N/A  . Number of children: 2  . Years of education: N/A   Occupational History  . Retired    Social History Main Topics  . Smoking status: Former Smoker    Quit date: 08/31/1968  . Smokeless tobacco: Never Used  . Alcohol use 0.0 oz/week     Comment: 1-2 glasses of wine/weekly  . Drug use: No  . Sexual activity: Yes   Other Topics Concern  . Not on file   Social History Narrative   Married      2 sons      Retired      2 cups coffee/daily      Gym 3-4 times/week    Physical Exam BP (!) 156/95   Pulse 88   Resp 20   Ht 5' (1.524 m)   Wt 157  lb (71.2 kg)   SpO2 92% Comment: RA  BMI 30.66 kg/m  Judith [name] 71 year old woman in no acute distress Alert and oriented 3 with no focal deficits No cervical or supraclavicular adenopathy Lungs clear with equal breath is bilaterally Cardiac regular rate and rhythm normal S1 and S2 Abdomen soft nontender Extremities no edema  Diagnostic Tests: CT CHEST WITHOUT CONTRAST  TECHNIQUE: Multidetector CT imaging of the chest was performed following the standard protocol without IV contrast.  COMPARISON:  11/25/2016  FINDINGS: Cardiovascular: The heart size is normal. No pericardial effusion. No thoracic aortic aneurysm.  Mediastinum/Nodes: Stable appearance of upper normal mediastinal lymph nodes. Dominant lymph node on prior study was 11 mm short axis subcarinal lymph node which is stable in the interval. No evidence for gross hilar lymphadenopathy although assessment is limited by the lack of intravenous contrast on today's study. The esophagus has normal imaging features. There is no axillary lymphadenopathy.  Lungs/Pleura: Sub solid pulmonary nodules in the right apex are stable. Sub solid linear opacity in the medial left apex is unchanged. Adjacent small solid left upper lobe pulmonary nodules (image 22 series 4) are stable, the larger of the 2 measuring 6 mm on today's exam. 7 mm left upper lobe nodule measured previously is stable (image 26 today). The 9 mm suprahilar left pulmonary nodule (image 33 series 4 today) is unchanged. Numerous other irregular pulmonary nodules are identified some of which appear more confluent on today's study (compare 2.4 cm right lower lobe nodule seen on image 58 today to the 2.4 cm nodule seen on image 81 previously. This may be related to the minimally different slice thickness used between the 2 studies, but interval progression not excluded.  Upper Abdomen: The liver shows diffusely decreased attenuation suggesting  steatosis.  Musculoskeletal: Bone windows reveal no worrisome lytic or sclerotic osseous lesions. Patient is status post right mastectomy.  IMPRESSION: 1. The numerous bilateral solid and sub solid pulmonary nodules have not resolved in the interval and some of the larger sub solid nodules appear more confluent today although this may be related to the slightly thicker slice collimation on today's exam. Given the persistence and history of breast cancer, PET-CT recommended to assess for hypermetabolism in these lesions. 2. Stable appearance of upper normal to borderline mediastinal lymphadenopathy. 3. Hepatic steatosis.   Electronically Signed   By: Misty Stanley M.D.   On: 01/26/2017 11:14 I personally reviewed the CT chest and compared to her previous film. I  concur with the findings noted above.  Impression: Judith [name] is a 71 year old woman with multiple pulmonary nodules. These are both sub-solid and solid in nature. She has a history of cutaneous sarcoidosis. She also has a history of recurrent breast cancer by he years ago. Both primary and metastatic malignancy are in the differential diagnosis. However, this would be a highly unusual appearance for either of those. More likely is sarcoidosis or some other type of infectious process such as AFB or fungal infection.  At this point with no improvement of the abnormalities, I recommended that we proceed with navigational bronchoscopy for biopsy. We discussed the advantages and disadvantages of navigational bronchoscopy versus CT-guided biopsy. She understands that both are subject to false-negative findings and neither can definitively rule out possibility of cancer in one of the nodules. The advantage of bronchoscopic biopsy will be the ability to access multiple lung nodules bilaterally the same setting.  I reviewed the general nature of the procedure with her. She understands this will be done in the operating room under  general anesthesia. We would plan to do it as an outpatient. I reviewed the indications, risks, benefits, and alternatives. She understands the risk include, but are not limited to pneumothorax, bleeding, failure to make a definitive diagnosis, as well as in rare instances blood clots, MI, or death. She understands and accepts those risks and agrees to proceed.  Plan: Electromagnetic navigational bronchoscopy on Monday 02/01/2017.  Melrose Nakayama, MD [facility] [phone]

## 2017-01-29 ENCOUNTER — Encounter: Payer: Self-pay | Admitting: Thoracic Surgery (Cardiothoracic Vascular Surgery)

## 2017-01-29 ENCOUNTER — Encounter (HOSPITAL_COMMUNITY)
Admission: RE | Admit: 2017-01-29 | Discharge: 2017-01-29 | Disposition: A | Discharge status: Home / Self Care | Payer: Medicare HMO | Source: Ambulatory Visit | Attending: Thoracic Surgery (Cardiothoracic Vascular Surgery) | Admitting: Thoracic Surgery (Cardiothoracic Vascular Surgery)

## 2017-01-29 ENCOUNTER — Encounter (HOSPITAL_COMMUNITY): Payer: Self-pay

## 2017-01-29 DIAGNOSIS — Z800 Family history of malignant neoplasm of digestive organs: Secondary | ICD-10-CM | POA: Diagnosis not present

## 2017-01-29 DIAGNOSIS — Z807 Family history of other malignant neoplasms of lymphoid, hematopoietic and related tissues: Secondary | ICD-10-CM | POA: Diagnosis not present

## 2017-01-29 DIAGNOSIS — E785 Hyperlipidemia, unspecified: Secondary | ICD-10-CM | POA: Diagnosis not present

## 2017-01-29 DIAGNOSIS — Z853 Personal history of malignant neoplasm of breast: Secondary | ICD-10-CM | POA: Diagnosis not present

## 2017-01-29 DIAGNOSIS — Z90710 Acquired absence of both cervix and uterus: Secondary | ICD-10-CM | POA: Diagnosis not present

## 2017-01-29 DIAGNOSIS — K589 Irritable bowel syndrome without diarrhea: Secondary | ICD-10-CM | POA: Diagnosis not present

## 2017-01-29 DIAGNOSIS — Z87891 Personal history of nicotine dependence: Secondary | ICD-10-CM | POA: Diagnosis not present

## 2017-01-29 DIAGNOSIS — R918 Other nonspecific abnormal finding of lung field: Secondary | ICD-10-CM | POA: Diagnosis not present

## 2017-01-29 DIAGNOSIS — D869 Sarcoidosis, unspecified: Secondary | ICD-10-CM | POA: Diagnosis not present

## 2017-01-29 DIAGNOSIS — Z79899 Other long term (current) drug therapy: Secondary | ICD-10-CM | POA: Diagnosis not present

## 2017-01-29 DIAGNOSIS — Z8042 Family history of malignant neoplasm of prostate: Secondary | ICD-10-CM | POA: Diagnosis not present

## 2017-01-29 DIAGNOSIS — Z01812 Encounter for preprocedural laboratory examination: Secondary | ICD-10-CM | POA: Diagnosis not present

## 2017-01-29 DIAGNOSIS — Z9011 Acquired absence of right breast and nipple: Secondary | ICD-10-CM | POA: Diagnosis not present

## 2017-01-29 DIAGNOSIS — K219 Gastro-esophageal reflux disease without esophagitis: Secondary | ICD-10-CM | POA: Diagnosis not present

## 2017-01-29 DIAGNOSIS — M1990 Unspecified osteoarthritis, unspecified site: Secondary | ICD-10-CM | POA: Diagnosis not present

## 2017-01-29 HISTORY — DX: Headache, unspecified: R51.9

## 2017-01-29 HISTORY — DX: Sarcoidosis, unspecified: D86.9

## 2017-01-29 HISTORY — DX: Headache: R51

## 2017-01-29 LAB — CBC
HCT: 44.1 % (ref 36.0–46.0)
Hemoglobin: 14.6 g/dL (ref 12.0–15.0)
MCH: 30.9 pg (ref 26.0–34.0)
MCHC: 33.1 g/dL (ref 30.0–36.0)
MCV: 93.4 fL (ref 78.0–100.0)
PLATELETS: 318 10*3/uL (ref 150–400)
RBC: 4.72 MIL/uL (ref 3.87–5.11)
RDW: 13.9 % (ref 11.5–15.5)
WBC: 8 10*3/uL (ref 4.0–10.5)

## 2017-01-29 LAB — COMPREHENSIVE METABOLIC PANEL
ALK PHOS: 76 U/L (ref 38–126)
ALT: 30 U/L (ref 14–54)
ANION GAP: 10 (ref 5–15)
AST: 26 U/L (ref 15–41)
Albumin: 4 g/dL (ref 3.5–5.0)
BUN: 12 mg/dL (ref 6–20)
CALCIUM: 9.5 mg/dL (ref 8.9–10.3)
CHLORIDE: 106 mmol/L (ref 101–111)
CO2: 25 mmol/L (ref 22–32)
CREATININE: 0.73 mg/dL (ref 0.44–1.00)
Glucose, Bld: 98 mg/dL (ref 65–99)
Potassium: 3.9 mmol/L (ref 3.5–5.1)
SODIUM: 141 mmol/L (ref 135–145)
Total Bilirubin: 0.6 mg/dL (ref 0.3–1.2)
Total Protein: 7.4 g/dL (ref 6.5–8.1)

## 2017-01-29 LAB — APTT: aPTT: 28 seconds (ref 24–36)

## 2017-01-29 LAB — PROTIME-INR
INR: 0.98
PROTHROMBIN TIME: 13 s (ref 11.4–15.2)

## 2017-01-29 NOTE — Progress Notes (Addendum)
PCP - [facility] Cardiologist - none, pt denies cardiac history or cardiac workup  Chest x-ray - order for CXR, pt requesting to not have CXR done today d/t having CT this week. Called office to see if patient needs this CXR prior to surgery, waiting for call back. Pt concerned that she has had radiation on her right side and does not want more radiation from the CXR. Spoke with Thurmond Butts at Dr. Leonarda Salon office and stated we need to draw CXR DOS since patient has already left PAT appointment.   Patient denies shortness of breath, fever, and chest pain at PAT appointment, pt denies signs of infection.    Patient verbalized understanding of instructions that were given to them at the PAT appointment. Patient was also instructed that they will need to review over the PAT instructions again at home before surgery.

## 2017-01-29 NOTE — Pre-Procedure Instructions (Signed)
[place]  01/29/2017      [facility] - [place], [place] - [place] [address] [place] [address] Phone: [phone] Fax: [phone]    Your procedure is scheduled on Monday June 4.  Report to [facility] Admitting at 5:30 A.M.  Call this number if you have problems the morning of surgery:  [phone]   Remember:  Do not eat food or drink liquids after midnight.  Take these medicines the morning of surgery with A SIP OF WATER: omeprazole (prilosec), methocarbamol (robaxin) if needed  STOP taking any Aspirin, diclofenac (voltaren), Aleve, Naproxen, Ibuprofen, Motrin, Advil, Goody's, BC's, all herbal medications, fish oil, and all vitamins   Do not wear jewelry, make-up or nail polish.  Do not wear lotions, powders, or perfumes, or deoderant.  Do not shave 48 hours prior to surgery.  Men may shave face and neck.  Do not bring valuables to the hospital.  [facility] is not responsible for any belongings or valuables.  Contacts, dentures or bridgework may not be worn into surgery.  Leave your suitcase in the car.  After surgery it may be brought to your room.  For patients admitted to the hospital, discharge time will be determined by your treatment team.  Patients discharged the day of surgery will not be allowed to drive home.   Special instructions:    [facility]- Preparing For Surgery  Before surgery, you can play an important role. Because skin is not sterile, your skin needs to be as free of germs as possible. You can reduce the number of germs on your skin by washing with CHG (chlorahexidine gluconate) Soap before surgery.  CHG is an antiseptic cleaner which kills germs and bonds with the skin to continue killing germs even after washing.  Please do not use if you have an allergy to CHG or antibacterial soaps. If your skin becomes reddened/irritated stop using the CHG.  Do not shave (including legs and  underarms) for at least 48 hours prior to first CHG shower. It is OK to shave your face.  Please follow these instructions carefully.   1. Shower the NIGHT BEFORE SURGERY and the MORNING OF SURGERY with CHG.   2. If you chose to wash your hair, wash your hair first as usual with your normal shampoo.  3. After you shampoo, rinse your hair and body thoroughly to remove the shampoo.  4. Use CHG as you would any other liquid soap. You can apply CHG directly to the skin and wash gently with a scrungie or a clean washcloth.   5. Apply the CHG Soap to your body ONLY FROM THE NECK DOWN.  Do not use on open wounds or open sores. Avoid contact with your eyes, ears, mouth and genitals (private parts). Wash genitals (private parts) with your normal soap.  6. Wash thoroughly, paying special attention to the area where your surgery will be performed.  7. Thoroughly rinse your body with warm water from the neck down.  8. DO NOT shower/wash with your normal soap after using and rinsing off the CHG Soap.  9. Pat yourself dry with a CLEAN TOWEL.   10. Wear CLEAN PAJAMAS   11. Place CLEAN SHEETS on your bed the night of your first shower and DO NOT SLEEP WITH PETS.    Day of Surgery: Do not apply any deodorants/lotions. Please wear clean clothes to the hospital/surgery center.

## 2017-02-01 ENCOUNTER — Ambulatory Visit (HOSPITAL_COMMUNITY): Payer: Medicare HMO

## 2017-02-01 ENCOUNTER — Encounter (HOSPITAL_COMMUNITY)
Admission: RE | Disposition: A | Discharge status: Home / Self Care | Payer: Self-pay | Source: Ambulatory Visit | Attending: Thoracic Surgery (Cardiothoracic Vascular Surgery)

## 2017-02-01 ENCOUNTER — Ambulatory Visit (HOSPITAL_COMMUNITY): Payer: Medicare HMO | Admitting: Anesthesiology

## 2017-02-01 ENCOUNTER — Ambulatory Visit (HOSPITAL_COMMUNITY)
Admission: RE | Admit: 2017-02-01 | Discharge: 2017-02-01 | Disposition: A | Discharge status: Home / Self Care | Payer: Medicare HMO | Source: Ambulatory Visit | Attending: Thoracic Surgery (Cardiothoracic Vascular Surgery) | Admitting: Thoracic Surgery (Cardiothoracic Vascular Surgery)

## 2017-02-01 ENCOUNTER — Encounter (HOSPITAL_COMMUNITY): Payer: Self-pay | Admitting: Surgery

## 2017-02-01 ENCOUNTER — Ambulatory Visit (HOSPITAL_COMMUNITY): Payer: Medicare HMO | Admitting: Emergency Medicine

## 2017-02-01 DIAGNOSIS — Z853 Personal history of malignant neoplasm of breast: Secondary | ICD-10-CM | POA: Insufficient documentation

## 2017-02-01 DIAGNOSIS — R918 Other nonspecific abnormal finding of lung field: Secondary | ICD-10-CM | POA: Insufficient documentation

## 2017-02-01 DIAGNOSIS — Z90710 Acquired absence of both cervix and uterus: Secondary | ICD-10-CM | POA: Insufficient documentation

## 2017-02-01 DIAGNOSIS — K219 Gastro-esophageal reflux disease without esophagitis: Secondary | ICD-10-CM | POA: Insufficient documentation

## 2017-02-01 DIAGNOSIS — Z01812 Encounter for preprocedural laboratory examination: Secondary | ICD-10-CM | POA: Insufficient documentation

## 2017-02-01 DIAGNOSIS — M1990 Unspecified osteoarthritis, unspecified site: Secondary | ICD-10-CM | POA: Insufficient documentation

## 2017-02-01 DIAGNOSIS — J309 Allergic rhinitis, unspecified: Secondary | ICD-10-CM | POA: Diagnosis not present

## 2017-02-01 DIAGNOSIS — J8410 Pulmonary fibrosis, unspecified: Secondary | ICD-10-CM | POA: Diagnosis not present

## 2017-02-01 DIAGNOSIS — Z9011 Acquired absence of right breast and nipple: Secondary | ICD-10-CM | POA: Diagnosis not present

## 2017-02-01 DIAGNOSIS — Z8042 Family history of malignant neoplasm of prostate: Secondary | ICD-10-CM | POA: Insufficient documentation

## 2017-02-01 DIAGNOSIS — Z01818 Encounter for other preprocedural examination: Secondary | ICD-10-CM | POA: Diagnosis not present

## 2017-02-01 DIAGNOSIS — Z87891 Personal history of nicotine dependence: Secondary | ICD-10-CM | POA: Insufficient documentation

## 2017-02-01 DIAGNOSIS — K589 Irritable bowel syndrome without diarrhea: Secondary | ICD-10-CM | POA: Insufficient documentation

## 2017-02-01 DIAGNOSIS — J984 Other disorders of lung: Secondary | ICD-10-CM | POA: Diagnosis not present

## 2017-02-01 DIAGNOSIS — Z79899 Other long term (current) drug therapy: Secondary | ICD-10-CM | POA: Insufficient documentation

## 2017-02-01 DIAGNOSIS — E785 Hyperlipidemia, unspecified: Secondary | ICD-10-CM | POA: Diagnosis not present

## 2017-02-01 DIAGNOSIS — D869 Sarcoidosis, unspecified: Secondary | ICD-10-CM | POA: Insufficient documentation

## 2017-02-01 DIAGNOSIS — Z419 Encounter for procedure for purposes other than remedying health state, unspecified: Secondary | ICD-10-CM

## 2017-02-01 DIAGNOSIS — Z807 Family history of other malignant neoplasms of lymphoid, hematopoietic and related tissues: Secondary | ICD-10-CM | POA: Insufficient documentation

## 2017-02-01 DIAGNOSIS — R911 Solitary pulmonary nodule: Secondary | ICD-10-CM | POA: Diagnosis not present

## 2017-02-01 DIAGNOSIS — K5730 Diverticulosis of large intestine without perforation or abscess without bleeding: Secondary | ICD-10-CM | POA: Diagnosis not present

## 2017-02-01 DIAGNOSIS — Z800 Family history of malignant neoplasm of digestive organs: Secondary | ICD-10-CM | POA: Insufficient documentation

## 2017-02-01 HISTORY — PX: VIDEO BRONCHOSCOPY WITH ENDOBRONCHIAL NAVIGATION: SHX6175

## 2017-02-01 SURGERY — VIDEO BRONCHOSCOPY WITH ENDOBRONCHIAL NAVIGATION
Anesthesia: General | Site: Chest

## 2017-02-01 MED ORDER — 0.9 % SODIUM CHLORIDE (POUR BTL) OPTIME
TOPICAL | Status: DC | PRN
  Administered 2017-02-01: 1000 mL

## 2017-02-01 MED ORDER — SUGAMMADEX SODIUM 200 MG/2ML IV SOLN
INTRAVENOUS | Status: DC | PRN
  Administered 2017-02-01: 150 mg via INTRAVENOUS

## 2017-02-01 MED ORDER — LIDOCAINE 2% (20 MG/ML) 5 ML SYRINGE
INTRAMUSCULAR | Status: AC
  Filled 2017-02-01: qty 5

## 2017-02-01 MED ORDER — ONDANSETRON HCL 4 MG/2ML IJ SOLN
INTRAMUSCULAR | Status: DC | PRN
  Administered 2017-02-01: 4 mg via INTRAVENOUS

## 2017-02-01 MED ORDER — LIDOCAINE HCL (CARDIAC) 20 MG/ML IV SOLN
INTRAVENOUS | Status: DC | PRN
  Administered 2017-02-01: 60 mg via INTRAVENOUS

## 2017-02-01 MED ORDER — PROPOFOL 10 MG/ML IV BOLUS
INTRAVENOUS | Status: AC
  Filled 2017-02-01: qty 20

## 2017-02-01 MED ORDER — PHENYLEPHRINE HCL 10 MG/ML IJ SOLN
INTRAVENOUS | Status: DC | PRN
  Administered 2017-02-01: 50 ug/min via INTRAVENOUS

## 2017-02-01 MED ORDER — MIDAZOLAM HCL 2 MG/2ML IJ SOLN
INTRAMUSCULAR | Status: AC
  Filled 2017-02-01: qty 2

## 2017-02-01 MED ORDER — ONDANSETRON HCL 4 MG/2ML IJ SOLN
INTRAMUSCULAR | Status: AC
  Filled 2017-02-01: qty 2

## 2017-02-01 MED ORDER — PROPOFOL 10 MG/ML IV BOLUS
INTRAVENOUS | Status: DC | PRN
  Administered 2017-02-01: 10 mg via INTRAVENOUS
  Administered 2017-02-01: 140 mg via INTRAVENOUS

## 2017-02-01 MED ORDER — LACTATED RINGERS IV SOLN
INTRAVENOUS | Status: DC | PRN
  Administered 2017-02-01: 07:00:00 via INTRAVENOUS

## 2017-02-01 MED ORDER — DEXAMETHASONE SODIUM PHOSPHATE 10 MG/ML IJ SOLN
INTRAMUSCULAR | Status: AC
  Filled 2017-02-01: qty 1

## 2017-02-01 MED ORDER — ROCURONIUM BROMIDE 100 MG/10ML IV SOLN
INTRAVENOUS | Status: DC | PRN
Start: 2017-02-01 — End: 2017-02-01
  Administered 2017-02-01: 5 mg via INTRAVENOUS
  Administered 2017-02-01: 50 mg via INTRAVENOUS

## 2017-02-01 MED ORDER — FENTANYL CITRATE (PF) 100 MCG/2ML IJ SOLN
INTRAMUSCULAR | Status: DC | PRN
  Administered 2017-02-01: 50 ug via INTRAVENOUS
  Administered 2017-02-01: 100 ug via INTRAVENOUS

## 2017-02-01 MED ORDER — EPINEPHRINE PF 1 MG/ML IJ SOLN
INTRAMUSCULAR | Status: AC
  Filled 2017-02-01: qty 1

## 2017-02-01 MED ORDER — DEXAMETHASONE SODIUM PHOSPHATE 10 MG/ML IJ SOLN
INTRAMUSCULAR | Status: DC | PRN
  Administered 2017-02-01: 10 mg via INTRAVENOUS

## 2017-02-01 MED ORDER — SUGAMMADEX SODIUM 200 MG/2ML IV SOLN
INTRAVENOUS | Status: AC
  Filled 2017-02-01: qty 2

## 2017-02-01 MED ORDER — FENTANYL CITRATE (PF) 250 MCG/5ML IJ SOLN
INTRAMUSCULAR | Status: AC
  Filled 2017-02-01: qty 5

## 2017-02-01 SURGICAL SUPPLY — 36 items
ADAPTER BRONCH F/PENTAX (ADAPTER) ×2 IMPLANT
BRUSH BIOPSY BRONCH 10 SDTNB (MISCELLANEOUS) ×4 IMPLANT
BRUSH SUPERTRAX BIOPSY (INSTRUMENTS) IMPLANT
BRUSH SUPERTRAX NDL-TIP CYTO (INSTRUMENTS) ×2 IMPLANT
CANISTER SUCT 3000ML PPV (MISCELLANEOUS) ×2 IMPLANT
CHANNEL WORK EXTEND EDGE 180 (KITS) IMPLANT
CHANNEL WORK EXTEND EDGE 45 (KITS) IMPLANT
CHANNEL WORK EXTEND EDGE 90 (KITS) IMPLANT
CONT SPEC 4OZ CLIKSEAL STRL BL (MISCELLANEOUS) ×4 IMPLANT
COVER BACK TABLE 60X90IN (DRAPES) ×2 IMPLANT
FILTER STRAW FLUID ASPIR (MISCELLANEOUS) IMPLANT
FORCEPS BIOP SUPERTRX PREMAR (INSTRUMENTS) ×2 IMPLANT
GAUZE SPONGE 4X4 12PLY STRL (GAUZE/BANDAGES/DRESSINGS) ×2 IMPLANT
GLOVE SURG SIGNA 7.5 PF LTX (GLOVE) ×2 IMPLANT
GOWN STRL REUS W/ TWL XL LVL3 (GOWN DISPOSABLE) ×1 IMPLANT
GOWN STRL REUS W/TWL XL LVL3 (GOWN DISPOSABLE) ×1
KIT CLEAN ENDO COMPLIANCE (KITS) ×2 IMPLANT
KIT PROCEDURE EDGE 180 (KITS) ×2 IMPLANT
KIT PROCEDURE EDGE 45 (KITS) IMPLANT
KIT PROCEDURE EDGE 90 (KITS) IMPLANT
KIT ROOM TURNOVER OR (KITS) ×2 IMPLANT
MARKER SKIN DUAL TIP RULER LAB (MISCELLANEOUS) ×2 IMPLANT
NEEDLE SUPERTRX PREMARK BIOPSY (NEEDLE) ×2 IMPLANT
NS IRRIG 1000ML POUR BTL (IV SOLUTION) ×2 IMPLANT
OIL SILICONE PENTAX (PARTS (SERVICE/REPAIRS)) ×2 IMPLANT
PAD ARMBOARD 7.5X6 YLW CONV (MISCELLANEOUS) ×4 IMPLANT
PATCHES PATIENT (LABEL) ×6 IMPLANT
SYR 20CC LL (SYRINGE) ×2 IMPLANT
SYR 20ML ECCENTRIC (SYRINGE) ×2 IMPLANT
SYR 30ML LL (SYRINGE) ×2 IMPLANT
SYR 5ML LL (SYRINGE) ×2 IMPLANT
TOWEL GREEN STERILE FF (TOWEL DISPOSABLE) ×2 IMPLANT
TRAP SPECIMEN MUCOUS 40CC (MISCELLANEOUS) ×2 IMPLANT
TUBE CONNECTING 20X1/4 (TUBING) ×4 IMPLANT
UNDERPAD 30X30 (UNDERPADS AND DIAPERS) ×2 IMPLANT
WATER STERILE IRR 1000ML POUR (IV SOLUTION) ×2 IMPLANT

## 2017-02-01 NOTE — Op Note (Signed)
Judith [name], Judith [name]           ACCOUNT NO.:  [id number]  MEDICAL RECORD NO.:  [id number]  LOCATION:                                 FACILITY:  PHYSICIAN:  Revonda Standard. Roxan Hockey, M.D. DATE OF BIRTH:  DATE OF PROCEDURE:  02/01/2017 DATE OF DISCHARGE:                              OPERATIVE REPORT   PREOPERATIVE DIAGNOSIS:  Multiple lung nodules.  POSTOPERATIVE DIAGNOSIS:  Multiple lung nodules.  PROCEDURE:  Electromagnetic navigational bronchoscopy with needle aspirations, brushings, transbronchial biopsies, and bronchoalveolar lavage.  SURGEON:  Revonda Standard. Roxan Hockey, M.D.  ASSISTANT:  None.  ANESTHESIA:  General.  FINDINGS:  Able to navigate into close proximity to right upper lobe and right lower lobe nodules with good alignment.  Left upper lobe difficult to get direct alignment.  Pulmonary brushing showed inflammation and possible granulomas.  No malignancy seen.  CLINICAL NOTE:  Judith [name] is a 71 year old woman with a history of breast cancer and cutaneous sarcoidosis.  Earlier this year, she developed coughing and wheezing.  Treatment with antibiotics and steroids improved her symptoms, but they did not resolve.  The CT of the chest showed multiple pulmonary abnormalities.  An interval followup CT showed the lesions were persistent.  She was advised to undergo navigational bronchoscopy for diagnostic purposes.  The indications, risks, benefits, and alternatives were discussed in detail with the patient.  She understood and accepted the risks and agreed to proceed.  OPERATIVE NOTE:  Judith [name] was brought to the operating room on February 01, 2017.  She had induction of general anesthesia and was intubated. Flexible fiberoptic bronchoscopy was performed via the endotracheal tube, it revealed normal endobronchial anatomy and no endobronchial lesions to the level of the subsegmental bronchi.  The locatable guide for navigation was placed through the bronchoscope  and registration was performed.  Three sites have been selected for biopsy, one in the right upper lobe, one in the right lower lobe and one in the left upper lobe. These were the largest lesions visible on the CT.  The locatable guide was advanced to the right upper lobe and the appropriate subsegmental bronchus was cannulated.  The catheter was advanced to within 5 mm of the right upper lobe lesion.  Sampling was performed with needle aspirations followed by brushings and then biopsies.  Three needle aspirations and three brushings with a triple brush were performed. Then, multiple biopsies were obtained with some of the biopsies being sent for AFB and fungal cultures and the remainder were sent for permanent pathology.  The locatable guide was redirected.  The bronchoscope was advanced to the right lower lobe bronchus and the appropriate subsegmental bronchus was cannulated.  The catheter again was advanced to within 9 mm of the center of the lesion and the sampling process was repeated in a similar fashion.  There was minor bleeding with biopsies, which was easily cleared with saline.  The bronchoscope then was redirected to the left side into the left upper lobe and the appropriate subsegmental bronchus was cannulated to approach the left upper lobe nodule. Despite appearing to have good alignment around 1 cm from the nodule, the locatable guide would not be in directed alignment with the nodule.  Repeated attempts  to reposition all ended up in approximately the same location. Trying to approach from other segmental bronchi yielded worse results.  Therefore, samples were taken from 9 mm from the center of the nodule, but not in direct alignment.  Needle aspirations were not performed, but brushings were performed with the triple brush and then biopsies were taken again with some sent for AFB and fungal cultures and the remainder sent for permanent pathology.  The bronchoscope  then was redirected to the right upper lobe and the locatable guide was again advanced into close proximity to the right upper lobe nodule.  Bronchoalveolar lavage was performed with 100 mL of sterile saline.  There was return of 25 mL of bloody fluid.  This will be sent for cytology.  The initial needle aspirations and brushings showed some scant inflammation and possible granulomas, no malignancy was seen.  A final inspection was made and there was no ongoing bleeding.  The bronchoscope was removed.  The patient was extubated in the operating room and taken to the postanesthetic care unit in good condition.     Revonda Standard Roxan Hockey, M.D.     SCH/MEDQ  D:  02/01/2017  T:  02/01/2017  Job:  [id number]

## 2017-02-01 NOTE — Anesthesia Procedure Notes (Signed)
Procedure Name: Intubation Date/Time: 02/01/2017 7:43 AM Performed by: Jenne Campus Pre-anesthesia Checklist: Patient identified, Emergency Drugs available, Suction available and Patient being monitored Patient Re-evaluated:Patient Re-evaluated prior to inductionOxygen Delivery Method: Circle System Utilized Preoxygenation: Pre-oxygenation with 100% oxygen Intubation Type: IV induction Ventilation: Mask ventilation without difficulty and Oral airway inserted - appropriate to patient size Laryngoscope Size: Miller and 2 Grade View: Grade III Tube type: Oral Tube size: 8.5 mm Number of attempts: 1 Airway Equipment and Method: Stylet and Oral airway Placement Confirmation: ETT inserted through vocal cords under direct vision,  positive ETCO2 and breath sounds checked- equal and bilateral Secured at: 21 cm Tube secured with: Tape Dental Injury: Teeth and Oropharynx as per pre-operative assessment  Difficulty Due To: Difficulty was anticipated, Difficult Airway- due to anterior larynx and Difficult Airway- due to dentition Comments: Smooth IV induction. EZ mask with oral airway. DL x 1 Mil 2 by [employer] CRNA. Unable to lift epiglottis. DLx1 MAC 3 by [facility] CRNA. Grade 4 view. DL x 1 Mil 2 Dr Linna Caprice. Grade 3 view. Bougie stylet utilized. +ETCO2 bbse.

## 2017-02-01 NOTE — Anesthesia Preprocedure Evaluation (Addendum)
Anesthesia Evaluation  Patient identified by MRN, date of birth, ID band Patient awake    Reviewed: Allergy & Precautions, NPO status , Patient's Chart, lab work & pertinent test results  Airway Mallampati: III  TM Distance: <3 FB Neck ROM: Full    Dental  (+) Teeth Intact, Dental Advisory Given   Pulmonary former smoker,    breath sounds clear to auscultation       Cardiovascular  Rhythm:Regular Rate:Normal     Neuro/Psych    GI/Hepatic   Endo/Other    Renal/GU      Musculoskeletal   Abdominal   Peds  Hematology   Anesthesia Other Findings   Reproductive/Obstetrics                           Anesthesia Physical Anesthesia Plan  ASA: III  Anesthesia Plan: General   Post-op Pain Management:    Induction: Intravenous  Airway Management Planned: Oral ETT  Additional Equipment:   Intra-op Plan:   Post-operative Plan: Extubation in OR  Informed Consent: I have reviewed the patients History and Physical, chart, labs and discussed the procedure including the risks, benefits and alternatives for the proposed anesthesia with the patient or authorized representative who has indicated his/her understanding and acceptance.   Dental advisory given  Plan Discussed with: CRNA and Anesthesiologist  Anesthesia Plan Comments:         Anesthesia Quick Evaluation

## 2017-02-01 NOTE — Addendum Note (Signed)
Addendum  created 02/01/17 1228 by Jenne Campus, CRNA   Anesthesia Intra Flowsheets edited

## 2017-02-01 NOTE — Anesthesia Postprocedure Evaluation (Signed)
Anesthesia Post Note  Patient: Judith [name]  Procedure(s) Performed: Procedure(s) (LRB): VIDEO BRONCHOSCOPY WITH ENDOBRONCHIAL NAVIGATION (N/A)     Patient location during evaluation: PACU Anesthesia Type: General Level of consciousness: awake, awake and alert and oriented Pain management: pain level controlled Vital Signs Assessment: post-procedure vital signs reviewed and stable Respiratory status: spontaneous breathing, respiratory function stable and nonlabored ventilation Cardiovascular status: blood pressure returned to baseline Anesthetic complications: no    Last Vitals:  Vitals:   02/01/17 1010 02/01/17 1030  BP:  (!) 107/55  Pulse: 87 88  Resp: 15   Temp: 36.8 C     Last Pain:  Vitals:   02/01/17 1030  TempSrc:   PainSc: 0-No pain                 Addalee Kavanagh COKER

## 2017-02-01 NOTE — Transfer of Care (Signed)
Immediate Anesthesia Transfer of Care Note  Patient: [place]  Procedure(s) Performed: Procedure(s): VIDEO BRONCHOSCOPY WITH ENDOBRONCHIAL NAVIGATION (N/A)  Patient Location: PACU  Anesthesia Type:General  Level of Consciousness: awake, oriented and patient cooperative  Airway & Oxygen Therapy: Patient Spontanous Breathing and Patient connected to nasal cannula oxygen  Post-op Assessment: Report given to RN and Post -op Vital signs reviewed and stable  Post vital signs: Reviewed and stable  Last Vitals:  Vitals:   02/01/17 0557 02/01/17 0925  BP: 135/79 115/66  Pulse: 90 95  Resp: 20   Temp: 36.9 C 36.4 C    Last Pain:  Vitals:   02/01/17 0925  TempSrc:   PainSc: Asleep      Patients Stated Pain Goal: 2 (37/95/58 3167)  Complications: No apparent anesthesia complications

## 2017-02-01 NOTE — Discharge Instructions (Signed)
Do not drive or engage in heavy physical activity for 24 hours  You may resume normal activities tomorrow  You may cough up small amounts of blood over the next few days. Call if you cough up more than 2 tablespoons of blood  You may use over the counter cough medication as needed. You may use acetaminophen (Tylenol) if needed for discomfort  Call [phone] if you develop chest pain, shortness of breath, fever > 101 F or cough up large amounts of blood  My office will contact you with follow up information

## 2017-02-01 NOTE — Addendum Note (Signed)
Addendum  created 02/01/17 1201 by Roberts Gaudy, MD   Anesthesia Event edited, Anesthesia Staff edited

## 2017-02-01 NOTE — Interval H&P Note (Signed)
History and Physical Interval Note:  02/01/2017 7:16 AM  Judith [name]  has presented today for surgery, with the diagnosis of MULTIPLE LUNG NODULES  The various methods of treatment have been discussed with the patient and family. After consideration of risks, benefits and other options for treatment, the patient has consented to  Procedure(s): Judith [name] (N/A) as a surgical intervention .  The patient's history has been reviewed, patient examined, no change in status, stable for surgery.  I have reviewed the patient's chart and labs.  Questions were answered to the patient's satisfaction.     Melrose Nakayama

## 2017-02-01 NOTE — Brief Op Note (Signed)
02/01/2017  9:34 AM  PATIENT:  Barbara Cower  71 y.o. female  PRE-OPERATIVE DIAGNOSIS:  MULTIPLE LUNG NODULES  POST-OPERATIVE DIAGNOSIS:  multiple lung nodules  PROCEDURE:  Procedure(s): VIDEO BRONCHOSCOPY WITH ENDOBRONCHIAL NAVIGATION (N/A) Needle aspirations, brushings, biopsies and BAL  SURGEON:  Surgeon(s) and Role:    * Melrose Nakayama, MD - Primary  ANESTHESIA:   general  EBL:  Total I/O In: 800 [I.V.:800] Out: 3 [Blood:3]  BLOOD ADMINISTERED:none  DRAINS: none   LOCAL MEDICATIONS USED:  NONE  SPECIMEN:  Source of Specimen:  RUL, RLL, LUL  DISPOSITION OF SPECIMEN:  Path and micro  PLAN OF CARE: Discharge to home after PACU  PATIENT DISPOSITION:  PACU - hemodynamically stable.   Delay start of Pharmacological VTE agent (>24hrs) due to surgical blood loss or risk of bleeding: not applicable

## 2017-02-01 NOTE — H&P (View-Only) (Signed)
[address]       [place] [address]             [phone]    HPI: Mrs. Oman returns for follow-up of multiple pulmonary "nodules"  Mrs. Pulido 71 year old woman with a past medical history of breast cancer in 2001 with recurrence in 2010, hiatal hernia, gastroesophageal reflux, hyperlipidemia, arthritis, irritable bowel syndrome, osteopenia, and cutaneous sarcoidosis. She has no prior evidence of pulmonary sarcoidosis other than a report of a tiny lung nodule noted on chest x-rays dating back to 2014.  Earlier this year she developed acute onset of cough and wheezing. She had been working in her yard the day before. She was treated with antibiotics and a short course of prednisone. Her cough improved but did not completely resolve. A CT of the chest showed multiple pulmonary abnormalities including some appeared nodular and others were more groundglass opacities or infiltrative in appearance. She now returns with a short interval CT to reevaluate those nodules.  In the interim since her last visit her cough persists. It has not really changed in character. It occasionally productive of clear sputum. No hemoptysis. Occasional wheezing. No significant shortness of breath. She has complained of some irritation of her right eye as well. Past Medical History:  Diagnosis Date  . Allergic rhinitis   . Allergy   . Arthritis   . Atrophic vaginitis   . Breast cancer ([place])    Right mastectomy  . Diverticulosis   . GERD (gastroesophageal reflux disease)   . H/O: pneumonia    as child-viral pneumonia  . History of shingles   . HLD (hyperlipidemia)    pt denies  . IBS (irritable bowel syndrome)   . Osteopenia   . Sliding hiatal hernia    Past Surgical History:  Procedure Laterality Date  . APPENDECTOMY    . BASAL CELL CARCINOMA EXCISION     scalp  . BASAL CELL CARCINOMA EXCISION  08/2016   nose  . BREAST LUMPECTOMY    . COLONOSCOPY  02/2010   divertics, rpt  5 years  . DEXA  2005   osteopenia, no change from 2003  . DILATION AND CURETTAGE OF UTERUS  10/1999  . LAPAROSCOPIC TOTAL HYSTERECTOMY  10/03   for fibroids/polyp  . MASTECTOMY  7/10   right  . POLYPECTOMY    . TOTAL ABDOMINAL HYSTERECTOMY  2003   for fibroids/polyps     Current Outpatient Prescriptions  Medication Sig Dispense Refill  . calcium-vitamin D (OSCAL WITH D) 500-200 MG-UNIT per tablet Take 1 tablet by mouth daily.    . diclofenac sodium (VOLTAREN) 1 % GEL Apply 2 g topically 3 (three) times daily as needed. 1 Tube 0  . fish oil-omega-3 fatty acids 1000 MG capsule Take 1 g by mouth daily.     . methocarbamol (ROBAXIN) 500 MG tablet TAKE ONE TABLET BY MOUTH EVERY EIGHT HOURS AS NEEDED FOR MUSCLE SPASMS 30 tablet 1  . Multiple Vitamins-Minerals (CENTRUM SILVER ULTRA WOMENS PO) Take by mouth.    Marland Kitchen omeprazole (PRILOSEC) 20 MG capsule TAKE ONE CAPSULE BY MOUTH DAILY AS NEEDED 30 capsule 11   No current facility-administered medications for this visit.    Family History  Problem Relation Age of Onset  . Prostate cancer Father   . Prostate cancer Brother   . Lymphoma Brother   . Heart attack Paternal Grandmother   . Stomach cancer Maternal Grandfather   . Colon cancer Neg Hx   .  Rectal cancer Neg Hx   . Esophageal cancer Neg Hx    Social History   Social History  . Marital status: Married    Spouse name: N/A  . Number of children: 2  . Years of education: N/A   Occupational History  . Retired    Social History Main Topics  . Smoking status: Former Smoker    Quit date: 08/31/1968  . Smokeless tobacco: Never Used  . Alcohol use 0.0 oz/week     Comment: 1-2 glasses of wine/weekly  . Drug use: No  . Sexual activity: Yes   Other Topics Concern  . Not on file   Social History Narrative   Married      2 sons      Retired      2 cups coffee/daily      Gym 3-4 times/week    Physical Exam BP (!) 156/95   Pulse 88   Resp 20   Ht 5' (1.524 m)   Wt 157  lb (71.2 kg)   SpO2 92% Comment: RA  BMI 30.66 kg/m  Mrs. Toy Cookey 71 year old woman in no acute distress Alert and oriented 3 with no focal deficits No cervical or supraclavicular adenopathy Lungs clear with equal breath is bilaterally Cardiac regular rate and rhythm normal S1 and S2 Abdomen soft nontender Extremities no edema  Diagnostic Tests: CT CHEST WITHOUT CONTRAST  TECHNIQUE: Multidetector CT imaging of the chest was performed following the standard protocol without IV contrast.  COMPARISON:  11/25/2016  FINDINGS: Cardiovascular: The heart size is normal. No pericardial effusion. No thoracic aortic aneurysm.  Mediastinum/Nodes: Stable appearance of upper normal mediastinal lymph nodes. Dominant lymph node on prior study was 11 mm short axis subcarinal lymph node which is stable in the interval. No evidence for gross hilar lymphadenopathy although assessment is limited by the lack of intravenous contrast on today's study. The esophagus has normal imaging features. There is no axillary lymphadenopathy.  Lungs/Pleura: Sub solid pulmonary nodules in the right apex are stable. Sub solid linear opacity in the medial left apex is unchanged. Adjacent small solid left upper lobe pulmonary nodules (image 22 series 4) are stable, the larger of the 2 measuring 6 mm on today's exam. 7 mm left upper lobe nodule measured previously is stable (image 26 today). The 9 mm suprahilar left pulmonary nodule (image 33 series 4 today) is unchanged. Numerous other irregular pulmonary nodules are identified some of which appear more confluent on today's study (compare 2.4 cm right lower lobe nodule seen on image 58 today to the 2.4 cm nodule seen on image 81 previously. This may be related to the minimally different slice thickness used between the 2 studies, but interval progression not excluded.  Upper Abdomen: The liver shows diffusely decreased attenuation suggesting  steatosis.  Musculoskeletal: Bone windows reveal no worrisome lytic or sclerotic osseous lesions. Patient is status post right mastectomy.  IMPRESSION: 1. The numerous bilateral solid and sub solid pulmonary nodules have not resolved in the interval and some of the larger sub solid nodules appear more confluent today although this may be related to the slightly thicker slice collimation on today's exam. Given the persistence and history of breast cancer, PET-CT recommended to assess for hypermetabolism in these lesions. 2. Stable appearance of upper normal to borderline mediastinal lymphadenopathy. 3. Hepatic steatosis.   Electronically Signed   By: Misty Stanley M.D.   On: 01/26/2017 11:14 I personally reviewed the CT chest and compared to her previous film. I  concur with the findings noted above.  Impression: Mrs. Rock Nephew is a 81 year old woman with multiple pulmonary nodules. These are both sub-solid and solid in nature. She has a history of cutaneous sarcoidosis. She also has a history of recurrent breast cancer by he years ago. Both primary and metastatic malignancy are in the differential diagnosis. However, this would be a highly unusual appearance for either of those. More likely is sarcoidosis or some other type of infectious process such as AFB or fungal infection.  At this point with no improvement of the abnormalities, I recommended that we proceed with navigational bronchoscopy for biopsy. We discussed the advantages and disadvantages of navigational bronchoscopy versus CT-guided biopsy. She understands that both are subject to false-negative findings and neither can definitively rule out possibility of cancer in one of the nodules. The advantage of bronchoscopic biopsy will be the ability to access multiple lung nodules bilaterally the same setting.  I reviewed the general nature of the procedure with her. She understands this will be done in the operating room under  general anesthesia. We would plan to do it as an outpatient. I reviewed the indications, risks, benefits, and alternatives. She understands the risk include, but are not limited to pneumothorax, bleeding, failure to make a definitive diagnosis, as well as in rare instances blood clots, MI, or death. She understands and accepts those risks and agrees to proceed.  Plan: Electromagnetic navigational bronchoscopy on Monday 02/01/2017.  Melrose Nakayama, MD [facility] [phone]

## 2017-02-02 ENCOUNTER — Encounter (HOSPITAL_COMMUNITY): Payer: Self-pay | Admitting: Thoracic Surgery (Cardiothoracic Vascular Surgery)

## 2017-02-02 LAB — ACID FAST SMEAR (AFB)
ACID FAST SMEAR - AFSCU2: NEGATIVE
ACID FAST SMEAR - AFSCU2: NEGATIVE

## 2017-02-02 LAB — ACID FAST SMEAR (AFB, MYCOBACTERIA): Acid Fast Smear: NEGATIVE

## 2017-02-03 ENCOUNTER — Encounter: Payer: Self-pay | Admitting: *Deleted

## 2017-02-03 ENCOUNTER — Ambulatory Visit
Admission: RE | Admit: 2017-02-03 | Discharge: 2017-02-03 | Disposition: A | Discharge status: Home / Self Care | Payer: Medicare HMO | Source: Ambulatory Visit | Attending: Thoracic Surgery (Cardiothoracic Vascular Surgery) | Admitting: Thoracic Surgery (Cardiothoracic Vascular Surgery)

## 2017-02-03 ENCOUNTER — Other Ambulatory Visit: Payer: Self-pay | Admitting: *Deleted

## 2017-02-03 DIAGNOSIS — R079 Chest pain, unspecified: Secondary | ICD-10-CM | POA: Diagnosis not present

## 2017-02-03 DIAGNOSIS — R071 Chest pain on breathing: Secondary | ICD-10-CM

## 2017-02-03 LAB — CULTURE, RESPIRATORY W GRAM STAIN: Culture: NORMAL

## 2017-02-03 NOTE — Progress Notes (Signed)
Judith [name] had her chest xray done as advised. It was free of any effusions or explanations for her symptoms. I informed her of the findings and advised her that if these continued to see her PCP. She agreed. I will inform Dr. Roxan Hockey of this episode.

## 2017-02-03 NOTE — Telephone Encounter (Signed)
Judith [name] had a BRONCHOSCOPY and ENB on 02/01/17. She was told to call if she experienced any chest pain. She said she has started having pain today when taking a deep breath, pain in her shoulder blades and sharp pain on one side of her chest. She is not short of breath. I consulted with Lars Pinks, P.A. And it was decided for her to get a chest xray and then determine the next course of action. She agreed.

## 2017-02-11 ENCOUNTER — Ambulatory Visit (INDEPENDENT_AMBULATORY_CARE_PROVIDER_SITE_OTHER): Payer: Medicare HMO | Admitting: Thoracic Surgery (Cardiothoracic Vascular Surgery)

## 2017-02-11 VITALS — BP 126/85 | HR 100 | Resp 20 | Ht 60.0 in | Wt 156.0 lb

## 2017-02-11 DIAGNOSIS — R918 Other nonspecific abnormal finding of lung field: Secondary | ICD-10-CM | POA: Diagnosis not present

## 2017-02-11 DIAGNOSIS — Z853 Personal history of malignant neoplasm of breast: Secondary | ICD-10-CM

## 2017-02-11 DIAGNOSIS — Z9889 Other specified postprocedural states: Secondary | ICD-10-CM

## 2017-02-11 MED ORDER — PREDNISONE 10 MG PO TABS: 10.0000 mg | ORAL_TABLET | Freq: Every day | ORAL | 1 refills | Status: DC

## 2017-02-11 NOTE — Progress Notes (Signed)
[address]       [place] [address]             [phone]    HPI: Judith [name] returns to discuss the results of her biopsy.  She is a 71 year old woman with a history of sarcoidosis of the scalp as well as breast cancer treated in 2001 and then treated for recurrence in 2010. She presented with cough and wheezing. She was found to have multiple pulmonary nodules on CT. On follow-up CT the nodules were becoming more confluence would proceed with navigational bronchoscopy on 02/01/2017.  She developed back pain the following day a chest x-ray showed no new issues and it turned out it was a primary back problem. That has now improved.  Past Medical History:  Diagnosis Date  . Allergic rhinitis   . Allergy   . Arthritis   . Atrophic vaginitis   . Breast cancer ([place])    Right mastectomy  . Diverticulosis   . GERD (gastroesophageal reflux disease)   . H/O: pneumonia    as child-viral pneumonia  . Headache   . History of shingles   . HLD (hyperlipidemia)    pt denies  . IBS (irritable bowel syndrome)   . Osteopenia   . Sarcoidosis   . Sliding hiatal hernia     Current Outpatient Prescriptions  Medication Sig Dispense Refill  . calcium-vitamin D (OSCAL WITH D) 500-200 MG-UNIT per tablet Take 1 tablet by mouth once a week.     . diclofenac sodium (VOLTAREN) 1 % GEL Apply 2 g topically 3 (three) times daily as needed. 1 Tube 0  . FIBER SELECT GUMMIES PO Take 2 tablets by mouth at bedtime.    . fish oil-omega-3 fatty acids 1000 MG capsule Take 1 g by mouth once a week.     Marland Kitchen ibuprofen (ADVIL,MOTRIN) 200 MG tablet Take 400 mg by mouth daily.    . methocarbamol (ROBAXIN) 500 MG tablet TAKE ONE TABLET BY MOUTH EVERY EIGHT HOURS AS NEEDED FOR MUSCLE SPASMS (Patient taking differently: TAKE ONE TABLET BY MOUTH DAILY IN THE MORNING) 30 tablet 1  . Multiple Vitamin (MULTIVITAMIN WITH MINERALS) TABS tablet Take 1 tablet by mouth at bedtime.    Marland Kitchen  neomycin-polymyxin-dexameth (MAXITROL) 0.1 % OINT Place 1 application into both eyes See admin instructions. TWICE DAILY X1 WEEK & THEN DECREASE TO ONCE DAILY FOR X2 WEEKS    . omeprazole (PRILOSEC) 20 MG capsule TAKE ONE CAPSULE BY MOUTH DAILY AS NEEDED (Patient taking differently: Take 20 mg by mouth daily before breakfast. TAKE ONE CAPSULE BY MOUTH DAILY AS NEEDED) 30 capsule 11  . predniSONE (DELTASONE) 10 MG tablet Take 1 tablet (10 mg total) by mouth daily with breakfast. 30 tablet 1   No current facility-administered medications for this visit.     Physical Exam BP 126/85   Pulse 100   Resp 20   Ht 5' (1.524 m)   Wt 156 lb (70.8 kg)   SpO2 95% Comment: RA  BMI 30.71 kg/m  71 year old woman in no acute distress Lungs clear with equal breath sounds bilaterally  Diagnostic Tests: Pathology showed chronic inflammation with rare granuloma and foreign body giant cell reaction. No malignancy was seen in any of the specimens.  AFB and fungal stains negative, cultures pending  Impression: Judith [name] is a 71 year old woman with a history of cutaneous sarcoidosis of the scalp who presented with cough and was found to have bilateral pulmonary  nodules. On follow-up these appeared more confluent and biopsies and cultures were obtained. Pathology was not definitive but is highly suspicious for pulmonary involvement with sarcoidosis. Fortunately there was no evidence of malignancy.  I recommended to her that we refer her to pulmonology for management of the pulmonary process (likely sarcoidosis). She was agreeable to that. She did request that we go ahead and start prednisone while awaiting the results of the biopsy.  Plan: Prednisone 10 mg a day  Referral to pulmonology  Follow-up with Dr. Glori Bickers.  I will be happy to see her back in any time in the future if I can be of any further assistance with her care.  I spent 15 minutes with [name] during this visit Melrose Nakayama, MD [facility] [phone]

## 2017-02-18 ENCOUNTER — Encounter: Payer: Self-pay | Admitting: *Deleted

## 2017-02-19 ENCOUNTER — Encounter: Payer: Self-pay | Admitting: Internal Medicine

## 2017-02-19 ENCOUNTER — Ambulatory Visit (INDEPENDENT_AMBULATORY_CARE_PROVIDER_SITE_OTHER): Payer: Medicare HMO | Admitting: Internal Medicine

## 2017-02-19 VITALS — BP 110/70 | HR 103 | Resp 16 | Ht 60.0 in | Wt 154.0 lb

## 2017-02-19 DIAGNOSIS — R05 Cough: Secondary | ICD-10-CM | POA: Diagnosis not present

## 2017-02-19 DIAGNOSIS — R918 Other nonspecific abnormal finding of lung field: Secondary | ICD-10-CM | POA: Diagnosis not present

## 2017-02-19 DIAGNOSIS — D869 Sarcoidosis, unspecified: Secondary | ICD-10-CM

## 2017-02-19 DIAGNOSIS — R059 Cough, unspecified: Secondary | ICD-10-CM

## 2017-02-19 MED ORDER — PREDNISONE 20 MG PO TABS
20.0000 mg | ORAL_TABLET | Freq: Every day | ORAL | 1 refills | Status: DC
Start: 2017-02-19 — End: 2017-03-25

## 2017-02-19 NOTE — Progress Notes (Signed)
Name: Judith [name] MRN: [id number] DOB: 1946-02-24     CONSULTATION DATE:02/19/2017  REFERRING MD :  Dr. Roxan Hockey  CHIEF COMPLAINT:  Abnormal CT chest  STUDIES:  CT chest Jan 26, 2017 I have Independently reviewed images of  CT chest   on 02/19/2017 Interpretation:B/l pulm nodules   HISTORY OF PRESENT ILLNESS:   71 year old pleasant white female seen today for abnormal CT chest with bilateral pulmonary nodules Patient has a diagnosis and history of sarcoidosis of the scalp In February 2018 patient developed a chronic cough patient was worked up for chest x-ray and a CT scan of the chest which revealed several pulmonary nodules Patient was then referred to cardiothoracic surgery for further evaluation Dr. 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 No malignant cells were found Patient was then started on prednisone 10 mg daily  Since starting the prednisone, her cough has slightly improved her wheezing has resolved In the past high doses of prednisone were given to patient and it has helped Patient has been previously treated with Plaquenil and she is currently off Plaquenil for the last 8 months Patient sees dermatology for her cutaneous sarcoid Patient sees ophthalmology on an annual basis to assess for sarcoid  At this time patient refuses Plaquenil therapy Patient also refuses immunotherapy  Patient has no signs of infection at this time Patient has no signs of acute heart failure at this time  Patient's cough is persistent and mild but is bothersome for the patient Patient does not have progressive shortness of breath or dyspnea exertion     PAST MEDICAL HISTORY :   has a past medical history of Allergic rhinitis; Allergy; Arthritis; Atrophic vaginitis; Breast cancer ([place]); Diverticulosis; GERD (gastroesophageal  reflux disease); H/O: pneumonia; Headache; History of shingles; HLD (hyperlipidemia); IBS (irritable bowel syndrome); Osteopenia; Sarcoidosis; and Sliding hiatal hernia.  has a past surgical history that includes Dilation and curettage of uterus (10/1999); Laparoscopic total hysterectomy (10/03); Appendectomy; Breast lumpectomy; Colonoscopy (02/2010); DEXA (2005); Total abdominal hysterectomy (2003); Excision basal cell carcinoma; Polypectomy; Mastectomy (7/10); Excision basal cell carcinoma (08/2016); and Video bronchoscopy with endobronchial navigation (N/A, 02/01/2017). Prior to Admission medications   Medication Sig Start Date End Date Taking? Authorizing Provider  calcium-vitamin D (OSCAL WITH D) 500-200 MG-UNIT per tablet Take 1 tablet by mouth once a week.    Yes [provider]  diclofenac sodium (VOLTAREN) 1 % GEL Apply 2 g topically 3 (three) times daily as needed. 12/10/16  Yes Tower, Wynelle Fanny, MD  FIBER SELECT GUMMIES PO Take 2 tablets by mouth at bedtime.   Yes [provider]  fish oil-omega-3 fatty acids 1000 MG capsule Take 1 g by mouth once a week.    Yes [provider]  ibuprofen (ADVIL,MOTRIN) 200 MG tablet Take 400 mg by mouth every 8 (eight) hours as needed.    Yes [provider]  methocarbamol (ROBAXIN) 500 MG tablet TAKE ONE TABLET BY MOUTH EVERY EIGHT HOURS AS NEEDED FOR MUSCLE SPASMS Patient taking differently: TAKE ONE TABLET BY MOUTH DAILY IN THE MORNING 12/07/16  Yes Tower, Wynelle Fanny, MD  Multiple Vitamin (MULTIVITAMIN WITH MINERALS) TABS tablet Take 1 tablet by mouth at bedtime.   Yes [provider]  neomycin-polymyxin-dexameth (MAXITROL) 0.1 % OINT Place 1 application into both eyes See admin instructions. TWICE DAILY X1 WEEK & THEN DECREASE TO ONCE DAILY FOR X2 WEEKS  Yes [provider]  omeprazole (PRILOSEC) 20 MG capsule TAKE ONE CAPSULE BY MOUTH DAILY AS NEEDED Patient taking differently: Take 20 mg by mouth daily before  breakfast. TAKE ONE CAPSULE BY MOUTH DAILY AS NEEDED 01/06/17  Yes Tower, Wynelle Fanny, MD  predniSONE (DELTASONE) 10 MG tablet Take 1 tablet (10 mg total) by mouth daily with breakfast. 02/11/17  Yes Melrose Nakayama, MD   Allergies  Allergen Reactions  . Niacin And Related Other (See Comments)    Caused black and blue marks all over body  . Meloxicam Rash    FAMILY HISTORY:  family history includes Heart attack in her paternal grandmother; Lymphoma in her brother; Prostate cancer in her brother and father; Stomach cancer in her maternal grandfather. SOCIAL HISTORY:  reports that she quit smoking about 48 years ago. She has never used smokeless tobacco. She reports that she drinks alcohol. She reports that she does not use drugs.  REVIEW OF SYSTEMS:   Constitutional: Negative for fever, chills, weight loss, malaise/fatigue and diaphoresis.  HENT: Negative for hearing loss, ear pain, nosebleeds, congestion, sore throat, neck pain, tinnitus and ear discharge.   Eyes: Negative for blurred vision, double vision, photophobia, pain, discharge and redness.  Respiratory: + cough, -hemoptysis, -sputum production, --shortness of breath, wheezing and stridor.   Cardiovascular: Negative for chest pain, palpitations, orthopnea, claudication, leg swelling and PND.  Gastrointestinal: Negative for heartburn, nausea, vomiting, abdominal pain, diarrhea, constipation, blood in stool and melena.  Genitourinary: Negative for dysuria, urgency, frequency, hematuria and flank pain.  Musculoskeletal: Negative for myalgias, back pain, joint pain and falls.  Skin: Negative for itching and rash.  Neurological: Negative for dizziness, tingling, tremors, sensory change, speech change, focal weakness, seizures, loss of consciousness, weakness and headaches.  Endo/Heme/Allergies: Negative for environmental allergies and polydipsia. Does not bruise/bleed easily.  ALL OTHER ROS ARE NEGATIVE   BP 110/70 (BP Location: Left  Arm, Patient Position: Sitting, Cuff Size: Normal)   Pulse (!) 103   Resp 16   Ht 5' (1.524 m)   Wt 154 lb (69.9 kg)   SpO2 93%   BMI 30.08 kg/m    Physical Examination:   GENERAL:NAD, no fevers, chills, no weakness no fatigue HEAD: Normocephalic, atraumatic.  EYES: Pupils equal, round, reactive to light. Extraocular muscles intact. No scleral icterus.  MOUTH: Moist mucosal membrane.   EAR, NOSE, THROAT: Clear without exudates. No external lesions.  NECK: Supple. No thyromegaly. No nodules. No JVD.  PULMONARY:CTA B/L no wheezes, no crackles, no rhonchi CARDIOVASCULAR: S1 and S2. Regular rate and rhythm. No murmurs, rubs, or gallops. No edema.  GASTROINTESTINAL: Soft, nontender, nondistended. No masses. Positive bowel sounds.  MUSCULOSKELETAL: No swelling, clubbing, or edema. Range of motion full in all extremities.  NEUROLOGIC: Cranial nerves II through XII are intact. No gross focal neurological deficits.  SKIN: + ulceration on scalp, +lesions on scalp   PSYCHIATRIC: Mood, affect within normal limits. The patient is awake, alert and oriented x 3. Insight, judgment intact.     ASSESSMENT / PLAN: 71 year old pleasant white female seen today for chronic cough patient with a history of cutaneous sarcoidosis and now presents with bilateral pulmonary nodules undergoing navigational bronchoscopy and findings consistent with an highly suspicious for pulmonary sarcoidosis   At this time prednisone 10 mg may not be helping patient as she has multi-organ involvement with her sarcoid Patient does not want to restart Plaquenil nor does she want to start immunotherapy, patient does not want cough suppressant at this  time  Patient is okay with increasing her prednisone to 20 mg daily and to reassess her respiratory symptoms   #1 follow-up dermatology #2 follow-up ophthalmology #3 start prednisone 20 mg daily #4 follow-up CT chest in 3-6 months   Patient  satisfied with Plan of action and  management. All questions answered Follow-up in one month to assess respiratory symptoms and to reassess prednisone therapy   Corrin Parker, M.D.  [facility]  Medical Director [place] Director [facility] Cardio-Pulmonary Department

## 2017-02-19 NOTE — Patient Instructions (Addendum)
Prednisone 20 mg daily  Follow up in 1 month

## 2017-02-22 LAB — CULTURE, FUNGUS WITHOUT SMEAR

## 2017-03-16 LAB — ACID FAST CULTURE WITH REFLEXED SENSITIVITIES
ACID FAST CULTURE - AFSCU3: NEGATIVE
ACID FAST CULTURE - AFSCU3: NEGATIVE

## 2017-03-16 LAB — ACID FAST CULTURE WITH REFLEXED SENSITIVITIES (MYCOBACTERIA): Acid Fast Culture: NEGATIVE

## 2017-03-17 LAB — ACID FAST SMEAR (AFB, MYCOBACTERIA)

## 2017-03-17 LAB — ACID FAST CULTURE WITH REFLEXED SENSITIVITIES (MYCOBACTERIA): Acid Fast Culture: NEGATIVE

## 2017-03-17 LAB — ACID FAST SMEAR (AFB): ACID FAST SMEAR - AFSCU2: NEGATIVE

## 2017-03-25 ENCOUNTER — Encounter: Payer: Self-pay | Admitting: Internal Medicine

## 2017-03-25 ENCOUNTER — Ambulatory Visit (INDEPENDENT_AMBULATORY_CARE_PROVIDER_SITE_OTHER): Payer: Medicare HMO | Admitting: Internal Medicine

## 2017-03-25 VITALS — BP 128/86 | HR 90 | Ht 60.0 in | Wt 155.0 lb

## 2017-03-25 DIAGNOSIS — D869 Sarcoidosis, unspecified: Secondary | ICD-10-CM

## 2017-03-25 MED ORDER — PREDNISONE 5 MG PO TABS: 15.0000 mg | ORAL_TABLET | Freq: Every day | ORAL | 1 refills | Status: DC

## 2017-03-25 NOTE — Patient Instructions (Signed)
Take  prednisone 15 mg(3 tablets) daily for 1 month Then take prednisone 10 mg( 2 tablets) daily for 1 month

## 2017-03-25 NOTE — Progress Notes (Signed)
Name: Judith [name] MRN: [id number] DOB: [birth year]     CONSULTATION DATE:03/25/2017  REFERRING MD :  Dr. Roxan [name]  CHIEF COMPLAINT:   Follow-up sarcoid  STUDIES:  CT chest Jan 26, 2017 I have Independently reviewed images of  CT chest   on 03/25/2017 Interpretation:B/l pulm nodules  Previous history 71 year old pleasant white female seen today for abnormal CT chest with bilateral pulmonary nodules Patient has a diagnosis and history of sarcoidosis of the scalp In February 2018 patient developed a chronic cough patient was worked up for chest x-ray and a CT scan of the chest which revealed several pulmonary nodules Patient was then referred to cardiothoracic surgery for further evaluation Dr. 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 No malignant cells were found Patient was then started on prednisone 10 mg daily  Since starting the prednisone, her cough has slightly improved her wheezing has resolved In the past high doses of prednisone were given to patient and it has helped Patient has been previously treated with Plaquenil and she is currently off Plaquenil for the last 8 months Patient sees dermatology for her cutaneous sarcoid Patient sees ophthalmology on an annual basis to assess for sarcoid  At this time patient refuses Plaquenil therapy Patient also refuses immunotherapy  Patient has no signs of infection at this time Patient has no signs of acute heart failure at this time  Patient's cough is persistent and mild but is bothersome for the patient Patient does not have progressive shortness of breath or dyspnea exertion  HISTORY OF PRESENT ILLNESS:   Patient was on prednisone therapy at 20 mg daily This regimen seems to have helped her symptoms overall She feels much better Her eye symptoms have resolved, patient  still  Skin lesions No signs of infection at this time No signs of distress at this time  her cough has improved significantly  Allergies  Allergen Reactions  . Niacin And Related Other (See Comments)    Caused black and blue marks all over body  . Meloxicam Rash    FAMILY HISTORY:  family history includes Heart attack in her paternal grandmother; Lymphoma in her brother; Prostate cancer in her brother and father; Stomach cancer in her maternal grandfather. SOCIAL HISTORY:  reports that she quit smoking about 48 years ago. She has never used smokeless tobacco. She reports that she drinks alcohol. She reports that she does not use drugs.  REVIEW OF SYSTEMS:   Constitutional: Negative for fever, chills, weight loss, malaise/fatigue and diaphoresis.  HENT: Negative for hearing loss, ear pain, nosebleeds, congestion, sore throat, neck pain, tinnitus and ear discharge.   Eyes: Negative for blurred vision, double vision, photophobia, pain, discharge and redness.  Respiratory: + cough, -hemoptysis, -sputum production, --shortness of breath, wheezing and stridor.   Cardiovascular: Negative for chest pain, palpitations, orthopnea, claudication, leg swelling and PND.   ALL OTHER ROS ARE NEGATIVE   BP 128/86 (BP Location: Left Arm, Cuff Size: Normal)   Pulse 90   Ht 5' (1.524 m)   Wt 155 lb (70.3 kg)   SpO2 96%   BMI 30.27 kg/m    Physical Examination:   GENERAL:NAD, no fevers, chills, no weakness no fatigue HEAD: Normocephalic, atraumatic.  EYES: Pupils equal, round, reactive to light. Extraocular muscles intact. No scleral icterus.  MOUTH: Moist mucosal membrane.   EAR, NOSE, THROAT: Clear without exudates. No external lesions.  NECK:  Supple. No thyromegaly. No nodules. No JVD.  PULMONARY:CTA B/L no wheezes, no crackles, no rhonchi CARDIOVASCULAR: S1 and S2. Regular rate and rhythm. No murmurs, rubs, or gallops. No edema.  SKIN: + ulceration on scalp, +lesions on scalp     PSYCHIATRIC: Mood, affect within normal limits. The patient is awake, alert and oriented x 3. Insight, judgment intact.     ASSESSMENT / PLAN: 71 year old pleasant white female seen today for chronic cough patient with a history of cutaneous sarcoidosis and now presents with bilateral pulmonary nodules s/p  navigational bronchoscopy and findings consistent with an highly suspicious for pulmonary sarcoidosis,  And after 20 mg of prednisone therapy for a month she feels better   At this time prednisone  20 mg is  helping patient as she has multi-organ involvement with her sarcoid.  Patient does not want to restart Plaquenil nor does she want to start immunotherapy, patient does not want cough suppressant at this time    #1 follow-up dermatology #2 follow-up ophthalmology #3 after further discussion with , will prescribe  Prednisone 15 mg daily for one month then 10 mg daily for 1 month and will follow-up for assessment   Patient  satisfied with Plan of action and management. All questions answered  follow-up in 3 months  Judith [name] Judith [name], M.D.  [name]  Medical Director [place] Director [facility] Cardio-Pulmonary Department

## 2017-03-26 DIAGNOSIS — D863 Sarcoidosis of skin: Secondary | ICD-10-CM | POA: Diagnosis not present

## 2017-03-29 ENCOUNTER — Institutional Professional Consult (permissible substitution): Payer: Medicare HMO | Admitting: Internal Medicine

## 2017-04-05 ENCOUNTER — Other Ambulatory Visit: Payer: Self-pay | Admitting: Family Medicine

## 2017-04-05 NOTE — Telephone Encounter (Signed)
Received refill electronically Last refill 12/07/16 #30/1 Last office visit 01/06/17

## 2017-04-05 NOTE — Telephone Encounter (Signed)
Will refill electronically  

## 2017-05-28 DIAGNOSIS — B372 Candidiasis of skin and nail: Secondary | ICD-10-CM | POA: Diagnosis not present

## 2017-05-28 DIAGNOSIS — C44619 Basal cell carcinoma of skin of left upper limb, including shoulder: Secondary | ICD-10-CM | POA: Diagnosis not present

## 2017-05-28 DIAGNOSIS — Z85828 Personal history of other malignant neoplasm of skin: Secondary | ICD-10-CM | POA: Diagnosis not present

## 2017-05-28 DIAGNOSIS — D485 Neoplasm of uncertain behavior of skin: Secondary | ICD-10-CM | POA: Diagnosis not present

## 2017-05-28 DIAGNOSIS — X32XXXA Exposure to sunlight, initial encounter: Secondary | ICD-10-CM | POA: Diagnosis not present

## 2017-05-28 DIAGNOSIS — L570 Actinic keratosis: Secondary | ICD-10-CM | POA: Diagnosis not present

## 2017-05-28 DIAGNOSIS — Z872 Personal history of diseases of the skin and subcutaneous tissue: Secondary | ICD-10-CM | POA: Diagnosis not present

## 2017-05-28 DIAGNOSIS — Z08 Encounter for follow-up examination after completed treatment for malignant neoplasm: Secondary | ICD-10-CM | POA: Diagnosis not present

## 2017-06-08 ENCOUNTER — Encounter: Payer: Self-pay | Admitting: Family Medicine

## 2017-06-08 ENCOUNTER — Ambulatory Visit (INDEPENDENT_AMBULATORY_CARE_PROVIDER_SITE_OTHER): Payer: Medicare HMO | Admitting: Family Medicine

## 2017-06-08 DIAGNOSIS — D849 Immunodeficiency, unspecified: Secondary | ICD-10-CM | POA: Diagnosis not present

## 2017-06-08 DIAGNOSIS — D899 Disorder involving the immune mechanism, unspecified: Secondary | ICD-10-CM

## 2017-06-08 DIAGNOSIS — H00036 Abscess of eyelid left eye, unspecified eyelid: Secondary | ICD-10-CM | POA: Diagnosis not present

## 2017-06-08 MED ORDER — AMOXICILLIN-POT CLAVULANATE 875-125 MG PO TABS: 1.0000 | ORAL_TABLET | Freq: Two times a day (BID) | ORAL | 0 refills | Status: DC

## 2017-06-08 NOTE — Progress Notes (Signed)
[facility]

## 2017-06-08 NOTE — Patient Instructions (Addendum)
Complete course of oral antibiotics x 10 day . Augmentin. Can use warm compresses on eye for relief  Call if redness spreading, pain increasing, fever, vision change.  If improving but not resolving completely.. Consider follow up here or with eye doctor. Marland Kitchen

## 2017-06-08 NOTE — Assessment & Plan Note (Signed)
No sign of deeper infection, no red flags.  Given pt immunocompromised will treat with oral antibiotics.  Has history of blepharitis.. Already on prednisone for ? pulm sarcoid. If not resolving consider alternate diagnosis and see eye MD.

## 2017-06-08 NOTE — Progress Notes (Signed)
   Subjective:    Patient ID: Judith [name], female    DOB: August 31, 1946, 71 y.o.   MRN: [id number]  HPI  71 year old female pt of Dr. Glori Bickers  With history of blepheritis presents 24 hours on left eyelid swelling.  She has treated it with topical steroid cream as usual. She is using warm wash clothes.   She has pain and swelling of upper eye lid. Clear discharge in eye, but redness, no itching.  No fever.  No URI  Symptoms.. No cough, no congestion.   No nodule noted.  No sick contacts.    She is immunocompromised.Marland Kitchen on prednisone for pulmonary sarcoid.    Review of Systems  Constitutional: Negative for fatigue and fever.  HENT: Negative for ear pain.   Eyes: Negative for pain.  Respiratory: Negative for chest tightness and shortness of breath.   Cardiovascular: Negative for chest pain, palpitations and leg swelling.  Gastrointestinal: Negative for abdominal pain.  Genitourinary: Negative for dysuria.       Objective:   Physical Exam  Constitutional: Vital signs are normal. She appears well-developed and well-nourished. She is cooperative.  Non-toxic appearance. She does not appear ill. No distress.  HENT:  Head: Normocephalic.  Right Ear: Hearing, tympanic membrane, external ear and ear canal normal. Tympanic membrane is not erythematous, not retracted and not bulging.  Left Ear: Hearing, tympanic membrane, external ear and ear canal normal. Tympanic membrane is not erythematous, not retracted and not bulging.  Nose: No mucosal edema or rhinorrhea. Right sinus exhibits no maxillary sinus tenderness and no frontal sinus tenderness. Left sinus exhibits no maxillary sinus tenderness and no frontal sinus tenderness.  Mouth/Throat: Uvula is midline, oropharynx is clear and moist and mucous membranes are normal. No posterior oropharyngeal edema or posterior oropharyngeal erythema.  Eyes: Pupils are equal, round, and reactive to light. Conjunctivae and EOM are normal. Lids are  everted and swept, no foreign bodies found. Right eye exhibits no chemosis, no discharge, no exudate and no hordeolum. No foreign body present in the right eye. Left eye exhibits no chemosis, no discharge, no exudate and no hordeolum. No foreign body present in the left eye. Right conjunctiva is not injected. Right conjunctiva has no hemorrhage. Left conjunctiva is not injected. Left conjunctiva has no hemorrhage. No scleral icterus.  Left eyelid erythematous and swollen, nml EOM.  Neck: Trachea normal and normal range of motion. Neck supple. Carotid bruit is not present. No thyroid mass and no thyromegaly present.  Cardiovascular: Normal rate, regular rhythm, S1 normal, S2 normal, normal heart sounds, intact distal pulses and normal pulses.  Exam reveals no gallop and no friction rub.   No murmur heard. Pulmonary/Chest: Effort normal and breath sounds normal. No tachypnea. No respiratory distress. She has no decreased breath sounds. She has no wheezes. She has no rhonchi. She has no rales.  Neurological: She is alert.  Skin: Skin is warm, dry and intact. No rash noted.  Psychiatric: Her speech is normal and behavior is normal. Judgment normal. Her mood appears not anxious. Cognition and memory are normal. She does not exhibit a depressed mood.          Assessment & Plan:

## 2017-06-13 ENCOUNTER — Encounter: Payer: Self-pay | Admitting: Family Medicine

## 2017-06-15 ENCOUNTER — Ambulatory Visit (INDEPENDENT_AMBULATORY_CARE_PROVIDER_SITE_OTHER): Payer: Medicare HMO

## 2017-06-15 DIAGNOSIS — Z23 Encounter for immunization: Secondary | ICD-10-CM | POA: Diagnosis not present

## 2017-06-17 ENCOUNTER — Encounter: Payer: Self-pay | Admitting: Internal Medicine

## 2017-06-17 ENCOUNTER — Ambulatory Visit (INDEPENDENT_AMBULATORY_CARE_PROVIDER_SITE_OTHER): Payer: Medicare HMO | Admitting: Internal Medicine

## 2017-06-17 VITALS — BP 124/82 | HR 89 | Resp 16 | Ht 60.0 in | Wt 157.0 lb

## 2017-06-17 DIAGNOSIS — H0014 Chalazion left upper eyelid: Secondary | ICD-10-CM | POA: Diagnosis not present

## 2017-06-17 DIAGNOSIS — D869 Sarcoidosis, unspecified: Secondary | ICD-10-CM | POA: Diagnosis not present

## 2017-06-17 MED ORDER — PREDNISONE 2.5 MG PO TABS: 2.5000 mg | ORAL_TABLET | Freq: Every day | ORAL | 0 refills | Status: DC

## 2017-06-17 NOTE — Progress Notes (Signed)
Name: KODI GUERRERA MRN: [id number] DOB: 1946/07/25     CONSULTATION DATE:06/17/2017  REFERRING MD :  Dr. Roxan Hockey  CHIEF COMPLAINT:   Follow-up sarcoid  STUDIES:  CT chest Jan 26, 2017 I have Independently reviewed images of  CT chest   on 06/17/2017 Interpretation:B/l pulm nodules  Previous history 71 year old pleasant white female seen today for abnormal CT chest with bilateral pulmonary nodules Patient has a diagnosis and history of sarcoidosis of the scalp In February 2018 patient developed a chronic cough patient was worked up for chest x-ray and a CT scan of the chest which revealed several pulmonary nodules Patient was then referred to cardiothoracic surgery for further evaluation Dr. 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 Patient sees ophthalmology on an annual basis to assess for sarcoid  At this time patient refuses Plaquenil therapy Patient also refuses immunotherapy  Patient has no signs of infection at this time Patient has no signs of acute heart failure at this time  Patient's cough is persistent and mild but is bothersome for the patient Patient does not have progressive shortness of breath or dyspnea exertion  HISTORY OF PRESENT ILLNESS:   Patient on prednisone therapy at 10 mg daily This regimen seems to have helped her symptoms overall She feels much better LEFT eye swollen and red-on oral abx and  left eye has improved  No signs of infection at this time No signs of distress at this time  her cough has improved significantly  Allergies  Allergen Reactions  . Niacin And Related Other (See Comments)    Caused black and blue marks all over body  . Meloxicam Rash    REVIEW OF SYSTEMS:   Constitutional: Negative for fever, chills, weight loss, +malaise/fatigue  HENT: Negative for hearing loss, ear pain, nosebleeds, congestion, sore throat, neck pain, tinnitus and ear discharge.   Eyes: Negative for blurred vision, double vision, photophobia, pain, discharge and redness.  Respiratory: -cough, -hemoptysis, -sputum production, --shortness of breath, wheezing and stridor.   Cardiovascular: Negative for chest pain, palpitations, orthopnea, claudication, leg swelling and PND.  ALL OTHER ROS ARE NEGATIVE   BP 124/82 (BP Location: Left Arm, Cuff Size: Normal)   Pulse 89   Resp 16   Ht 5' (1.524 m)   Wt 157 lb (71.2 kg)   SpO2 94%   BMI 30.66 kg/m    Physical Examination:   GENERAL:NAD, no fevers, chills, no weakness no fatigue HEAD: Normocephalic, atraumatic.  EYES: Pupils equal, round, reactive to light. Extraocular muscles intact. No scleral icterus.  MOUTH: Moist mucosal membrane.   EAR, NOSE, THROAT: Clear without exudates. No external lesions.  NECK: Supple. No thyromegaly. No nodules. No JVD.  PULMONARY:CTA B/L no wheezes, no crackles, no rhonchi CARDIOVASCULAR: S1 and S2. Regular rate and rhythm. No murmurs, rubs, or gallops. No edema.  SKIN: + ulceration on scalp, +lesions on scalp   PSYCHIATRIC: Mood, affect within normal limits. The patient is awake, alert and oriented x 3. Insight, judgment intact.  ASSESSMENT / PLAN:  71 year old pleasant white female seen today for chronic cough patient with a history of cutaneous sarcoidosis and now presents with bilateral pulmonary nodules s/p  navigational bronchoscopy and findings consistent with an highly suspicious for  pulmonary sarcoidosis,    At this time prednisone  10 mg seems to help patient as she has multi-organ involvement with her sarcoid.  Patient does not want to restart Plaquenil nor does she want to start immunotherapy, patient does not want cough suppressant at this time    #1 follow-up dermatology #2 follow-up ophthalmology #3 after further discussion with , will prescribe  Prednisone 5 mg daily for one month then  2.5 mg daily for 1 month and will follow-up for assessment   Patient  satisfied with Plan of action and management. All questions answered  follow-up in 3 months  Anoop Hemmer Patricia Pesa, M.D.  [facility]  Medical Director [place] Director [facility] Cardio-Pulmonary Department

## 2017-06-17 NOTE — Patient Instructions (Signed)
Prednisone 5 mg daily for 1 month Prednisone 2.5 mg daily for 1 month Then stop

## 2017-07-05 ENCOUNTER — Encounter: Payer: Self-pay | Admitting: Emergency Medicine

## 2017-07-05 ENCOUNTER — Emergency Department
Admission: EM | Admit: 2017-07-05 | Discharge: 2017-07-05 | Disposition: A | Discharge status: Home / Self Care | Payer: Medicare HMO | Attending: Emergency Medicine | Admitting: Emergency Medicine

## 2017-07-05 DIAGNOSIS — Y92002 Bathroom of unspecified non-institutional (private) residence single-family (private) house as the place of occurrence of the external cause: Secondary | ICD-10-CM | POA: Insufficient documentation

## 2017-07-05 DIAGNOSIS — Z853 Personal history of malignant neoplasm of breast: Secondary | ICD-10-CM | POA: Diagnosis not present

## 2017-07-05 DIAGNOSIS — W19XXXA Unspecified fall, initial encounter: Secondary | ICD-10-CM

## 2017-07-05 DIAGNOSIS — E785 Hyperlipidemia, unspecified: Secondary | ICD-10-CM | POA: Insufficient documentation

## 2017-07-05 DIAGNOSIS — Y939 Activity, unspecified: Secondary | ICD-10-CM | POA: Diagnosis not present

## 2017-07-05 DIAGNOSIS — W1789XA Other fall from one level to another, initial encounter: Secondary | ICD-10-CM | POA: Diagnosis not present

## 2017-07-05 DIAGNOSIS — Z79899 Other long term (current) drug therapy: Secondary | ICD-10-CM | POA: Insufficient documentation

## 2017-07-05 DIAGNOSIS — S0101XA Laceration without foreign body of scalp, initial encounter: Secondary | ICD-10-CM | POA: Insufficient documentation

## 2017-07-05 DIAGNOSIS — Y92009 Unspecified place in unspecified non-institutional (private) residence as the place of occurrence of the external cause: Secondary | ICD-10-CM

## 2017-07-05 DIAGNOSIS — Y998 Other external cause status: Secondary | ICD-10-CM | POA: Diagnosis not present

## 2017-07-05 DIAGNOSIS — Z87891 Personal history of nicotine dependence: Secondary | ICD-10-CM | POA: Diagnosis not present

## 2017-07-05 MED ORDER — LIDOCAINE-EPINEPHRINE-TETRACAINE (LET) SOLUTION
3.0000 mL | Freq: Once | NASAL | Status: AC
Start: 2017-07-05 — End: 2017-07-05
  Administered 2017-07-05: 3 mL via TOPICAL
  Filled 2017-07-05: qty 3

## 2017-07-05 NOTE — ED Triage Notes (Signed)
Pt reports was sitting at the edge of bath tub,loss her balance and fell backwards in the bath tub reports hit her head denies any LOC, denies any blood thinners, no pain at present , has laceration to back of head bleeding controlled

## 2017-07-05 NOTE — ED Notes (Signed)
Pt slipped in the bathtub and has a cut on the posterior occipital back of head. No LOS or dizziness.

## 2017-07-05 NOTE — Discharge Instructions (Signed)
Keep the wound clean. Follow-up with your provider for staple removal in 7-10 days.

## 2017-07-06 NOTE — ED Provider Notes (Signed)
[facility] Emergency Department Provider Note ____________________________________________  Time seen: 2216  I have reviewed the triage vital signs and the nursing notes.  HISTORY  Chief Complaint  Head Laceration and Fall  HPI Judith [name] is a 71 y.o. female to the ED for evaluation of posterior scalp laceration, following a mechanical fall at home.  Patient describes it on the edge of the bathtub, when she apparently lost her balance, and fell backwards.  She hit the back of her scalp on the bathtub and sustained a laceration.  She denies any other head injury,  loss of consciousness, or vision change.   Past Medical History:  Diagnosis Date  . Allergic rhinitis   . Allergy   . Arthritis   . Atrophic vaginitis   . Breast cancer ([place])    Right mastectomy  . Diverticulosis   . GERD (gastroesophageal reflux disease)   . H/O: pneumonia    as child-viral pneumonia  . Headache   . History of shingles   . HLD (hyperlipidemia)    pt denies  . IBS (irritable bowel syndrome)   . Osteopenia   . Sarcoidosis   . Sliding hiatal hernia     Patient Active Problem List   Diagnosis Date Noted  . Eyelid cellulitis, left 06/08/2017  . Immunocompromised ([place]) 06/08/2017  . Cough 11/16/2016  . Routine general medical examination at a health care facility 01/06/2016  . IBS (irritable colon syndrome) 05/31/2015  . Colon polyps 05/31/2015  . Trochanteric bursitis of both hips 05/31/2015  . Estrogen deficiency 12/05/2014  . Low back pain 09/26/2014  . Encounter for Medicare annual wellness exam 09/24/2013  . Personal history of other malignant neoplasm of skin 09/21/2013  . Enlarged lymph node 03/07/2013  . Sarcoid 08/09/2012  . Mixed incontinence urge and stress 03/16/2012  . ALOPECIA 11/14/2010  . GERD 01/24/2009  . NEOPLASM, MALIGNANT, BREAST, HX OF 01/02/2009  . Hyperlipidemia 11/17/2007  . VAGINITIS, ATROPHIC, SYMPTOMATIC 11/17/2007  . Osteopenia  11/17/2007  . TINNITUS 09/07/2007  . ALLERGIC RHINITIS 09/07/2007  . DIVERTICULOSIS, COLON 09/07/2007  . Fatty liver 09/07/2007  . History of diverticulitis 08/12/2007    Past Surgical History:  Procedure Laterality Date  . APPENDECTOMY    . BASAL CELL CARCINOMA EXCISION     scalp  . BASAL CELL CARCINOMA EXCISION  08/2016   nose  . BREAST LUMPECTOMY    . COLONOSCOPY  02/2010   divertics, rpt 5 years  . DEXA  2005   osteopenia, no change from 2003  . DILATION AND CURETTAGE OF UTERUS  10/1999  . LAPAROSCOPIC TOTAL HYSTERECTOMY  10/03   for fibroids/polyp  . MASTECTOMY  7/10   right  . POLYPECTOMY    . TOTAL ABDOMINAL HYSTERECTOMY  2003   for fibroids/polyps    Prior to Admission medications   Medication Sig Start Date End Date Taking? Authorizing Provider  amoxicillin-clavulanate (AUGMENTIN) 875-125 MG tablet Take 1 tablet by mouth 2 (two) times daily. 06/08/17   Bedsole, Amy E, MD  calcium-vitamin D (OSCAL WITH D) 500-200 MG-UNIT per tablet Take 1 tablet by mouth once a week.     [provider]  diclofenac sodium (VOLTAREN) 1 % GEL Apply 2 g topically 3 (three) times daily as needed. 12/10/16   Tower, Wynelle Fanny, MD  FIBER SELECT GUMMIES PO Take 2 tablets by mouth at bedtime.    [provider]  fish oil-omega-3 fatty acids 1000 MG capsule Take 1 g by mouth once  a week.     [provider]  ibuprofen (ADVIL,MOTRIN) 200 MG tablet Take 400 mg by mouth every 8 (eight) hours as needed.     [provider]  methocarbamol (ROBAXIN) 500 MG tablet TAKE 1 TABLET BY MOUTH EVERY 8 HOURS AS NEEDED FOR MUSCLE SPASMS. 04/05/17   Tower, Wynelle Fanny, MD  Multiple Vitamin (MULTIVITAMIN WITH MINERALS) TABS tablet Take 1 tablet by mouth at bedtime.    [provider]  neomycin-polymyxin-dexameth (MAXITROL) 0.1 % OINT Place 1 application into both eyes See admin instructions. TWICE DAILY X1 WEEK & THEN DECREASE TO ONCE DAILY FOR X2 WEEKS    [provider]   nystatin cream (MYCOSTATIN)  05/04/17   [provider]  omeprazole (PRILOSEC) 20 MG capsule TAKE ONE CAPSULE BY MOUTH DAILY AS NEEDED Patient taking differently: Take 20 mg by mouth daily before breakfast. TAKE ONE CAPSULE BY MOUTH DAILY AS NEEDED 01/06/17   Tower, Wynelle Fanny, MD  predniSONE (DELTASONE) 2.5 MG tablet Take 1 tablet (2.5 mg total) by mouth daily. 06/17/17 06/17/18  Flora Lipps, MD  predniSONE (DELTASONE) 5 MG tablet Take 3 tablets (15 mg total) by mouth daily with breakfast. Patient taking differently: Take 10 mg by mouth daily with breakfast.  03/25/17   Flora Lipps, MD    Allergies Niacin and related and Meloxicam  Family History  Problem Relation Age of Onset  . Prostate cancer Father   . Prostate cancer Brother   . Lymphoma Brother   . Heart attack Paternal Grandmother   . Stomach cancer Maternal Grandfather   . Colon cancer Neg Hx   . Rectal cancer Neg Hx   . Esophageal cancer Neg Hx     Social History Social History   Tobacco Use  . Smoking status: Former Smoker    Last attempt to quit: 08/31/1968    Years since quitting: 48.8  . Smokeless tobacco: Never Used  Substance Use Topics  . Alcohol use: Yes    Alcohol/week: 0.0 oz    Comment: 1-2 glasses of wine/weekly  . Drug use: No    Review of Systems  Constitutional: Negative for fever. Eyes: Negative for visual changes. ENT: Negative for sore throat. Cardiovascular: Negative for chest pain. Respiratory: Negative for shortness of breath. Gastrointestinal: Negative for abdominal pain, vomiting and diarrhea. Genitourinary: Negative for dysuria. Musculoskeletal: Negative for back pain. Skin: Negative for rash. Posterior scalp lac as above  Neurological: Negative for headaches, focal weakness or numbness. ____________________________________________  PHYSICAL EXAM:  VITAL SIGNS: ED Triage Vitals  Enc Vitals Group     BP 07/05/17 2107 (!) 142/71     Pulse Rate 07/05/17 2107 75     Resp  07/05/17 2107 17     Temp 07/05/17 2107 98.9 F (37.2 C)     Temp Source 07/05/17 2107 Oral     SpO2 07/05/17 2107 94 %     Weight 07/05/17 2109 150 lb (68 kg)     Height 07/05/17 2109 5' 2"  (1.575 m)     Head Circumference --      Peak Flow --      Pain Score 07/05/17 2106 2     Pain Loc --      Pain Edu? --      Excl. in [place]? --     Constitutional: Alert and oriented. Well appearing and in no distress. Head: Normocephalic and atraumatic. Patient with a linear laceration to the posterior scalp. The horizontal laceration is about 3 cm  in length. No active bleeding  Eyes: Conjunctivae are normal. PERRL. Normal extraocular movements Neck: Supple. No thyromegaly. Cardiovascular: Normal rate, regular rhythm. Normal distal pulses. Respiratory: Normal respiratory effort. No wheezes/rales/rhonchi. Musculoskeletal: Nontender with normal range of motion in all extremities.  Neurologic:  Normal gait without ataxia. Normal speech and language. No gross focal neurologic deficits are appreciated. Skin:  Skin is warm, dry and intact. No rash noted. ____________________________________________  PROCEDURES  LACERATION REPAIR Performed by: Melvenia Needles Authorized by: Melvenia Needles Consent: Verbal consent obtained. Risks and benefits: risks, benefits and alternatives were discussed Consent given by: patient Patient identity confirmed: provided demographic data Prepped and Draped in normal sterile fashion Wound explored  Laceration Location: posterior scalp  Laceration Length: 3cm  No Foreign Bodies seen or palpated  Anesthesia: local infiltration  Local anesthetic: lidocaine-epinephrine-tetracaine  Anesthetic total: 3 ml  Irrigation method: syringe Amount of cleaning: standard  Skin closure: staples  Number of staples: 4  Patient tolerance: Patient tolerated the procedure well with no immediate  complications. ____________________________________________  INITIAL IMPRESSION / ASSESSMENT AND PLAN / ED COURSE  Patient with ED evaluation of a mechanical fall at home resulting in a posterior scalp laceration.  Patient's exam is overall benign.  No LOC, or visual disturbance.  Her superficial scalp wound is repaired using staples, after normal sterile prep and drape.  Patient is discharged with wound care instructions and will follow-up with a primary care provider for staple removal in 7-10 days. ____________________________________________  FINAL CLINICAL IMPRESSION(S) / ED DIAGNOSES  Final diagnoses:  Fall in home, initial encounter  Laceration of scalp, initial encounter      Melvenia Needles, PA-C 07/08/17 1608    Darel Hong, MD 07/08/17 2250

## 2017-07-14 ENCOUNTER — Encounter: Payer: Self-pay | Admitting: Family Medicine

## 2017-07-14 ENCOUNTER — Ambulatory Visit: Payer: Medicare HMO | Admitting: Family Medicine

## 2017-07-14 VITALS — BP 122/74 | HR 75 | Temp 98.5°F | Wt 160.8 lb

## 2017-07-14 DIAGNOSIS — R51 Headache: Secondary | ICD-10-CM

## 2017-07-14 DIAGNOSIS — R519 Headache, unspecified: Secondary | ICD-10-CM

## 2017-07-14 DIAGNOSIS — Z4802 Encounter for removal of sutures: Secondary | ICD-10-CM | POA: Diagnosis not present

## 2017-07-14 DIAGNOSIS — H00014 Hordeolum externum left upper eyelid: Secondary | ICD-10-CM

## 2017-07-14 NOTE — Progress Notes (Signed)
Subjective:    Patient ID: Judith [name], female    DOB: June 05, 1946, 71 y.o.   MRN: [id number]  HPI This is a 71 yo female who presents today with staple removal, wants her eye checked and has complaints of sinus headache for several weeks.   She had a fall at home 9 days ago and hit the back of her head on the bathtub.  Staples were placed. She has not had any problems with pain or drainage.   She had a visit for left blepheritis and took 10 days of augmentin with significant improvement but not complete resolution. Continues to do warm compresses 3-4 times a day. Feels tender at times. Eye is clinically watery, no purulent drainage.   Has noticed sinus headache every day, little nasal congestion, some blood with blowing. Headache across eyes, daily, took some excedrin with relief. Headache not always there in the morning, sometimes lingers. No visual changes. Last eye exam recently. Has history of cataracts.  She has seasonal allergies and occasionally takes antihistamines.  Has not been taking antihistamines lately   Past Medical History:  Diagnosis Date  . Allergic rhinitis   . Allergy   . Arthritis   . Atrophic vaginitis   . Breast cancer ([place])    Right mastectomy  . Diverticulosis   . GERD (gastroesophageal reflux disease)   . H/O: pneumonia    as child-viral pneumonia  . Headache   . History of shingles   . HLD (hyperlipidemia)    pt denies  . IBS (irritable bowel syndrome)   . Osteopenia   . Sarcoidosis   . Sliding hiatal hernia    Past Surgical History:  Procedure Laterality Date  . APPENDECTOMY    . BASAL CELL CARCINOMA EXCISION     scalp  . BASAL CELL CARCINOMA EXCISION  08/2016   nose  . BREAST LUMPECTOMY    . COLONOSCOPY  02/2010   divertics, rpt 5 years  . DEXA  2005   osteopenia, no change from 2003  . DILATION AND CURETTAGE OF UTERUS  10/1999  . LAPAROSCOPIC TOTAL HYSTERECTOMY  10/03   for fibroids/polyp  . MASTECTOMY  7/10   right  .  POLYPECTOMY    . TOTAL ABDOMINAL HYSTERECTOMY  2003   for fibroids/polyps   Family History  Problem Relation Age of Onset  . Prostate cancer Father   . Prostate cancer Brother   . Lymphoma Brother   . Heart attack Paternal Grandmother   . Stomach cancer Maternal Grandfather   . Colon cancer Neg Hx   . Rectal cancer Neg Hx   . Esophageal cancer Neg Hx    Social History   Tobacco Use  . Smoking status: Former Smoker    Last attempt to quit: 08/31/1968    Years since quitting: 48.9  . Smokeless tobacco: Never Used  Substance Use Topics  . Alcohol use: Yes    Alcohol/week: 0.0 oz    Comment: 1-2 glasses of wine/weekly  . Drug use: No      Review of Systems Per HPI    Objective:   Physical Exam  Constitutional: She is oriented to person, place, and time. She appears well-developed and well-nourished. No distress.  HENT:  Head: Normocephalic.  Nose: Nose normal.  Mouth/Throat: Oropharynx is clear and moist.  7 staples intact at back of head.  Edges well approximated.  No erythema or drainage  Eyes: Conjunctivae are normal.    Neck: Normal range of motion. Neck  supple.  Cardiovascular: Normal rate, regular rhythm and normal heart sounds.  Pulmonary/Chest: Effort normal and breath sounds normal.  Neurological: She is alert and oriented to person, place, and time.  Skin: Skin is warm and dry. She is not diaphoretic.  Psychiatric: She has a normal mood and affect. Her behavior is normal. Judgment and thought content normal.  Vitals reviewed.     BP 122/74 (BP Location: Left Arm, Patient Position: Sitting, Cuff Size: Normal)   Pulse 75   Temp 98.5 F (36.9 C) (Oral)   Wt 160 lb 12 oz (72.9 kg)   SpO2 97%   BMI 29.40 kg/m  Wt Readings from Last 3 Encounters:  07/14/17 160 lb 12 oz (72.9 kg)  07/05/17 150 lb (68 kg)  06/17/17 157 lb (71.2 kg)   Seven staple removed, edges well approximated    Assessment & Plan:  1. Encounter for staple removal - post staple  removal instructions provided  2. Sinus headache -She will continue over-the-counter analgesics and add her antihistamine -Return to clinic if no improvement  3. Hordeolum externum left upper eyelid -Improved following antibiotic, but not completely resolved. She has an appointment with her dermatologist next week and I suggested she ask her to have a look at it then.  Clarene Reamer, FNP-BC  [facility] [place]  07/15/2017 5:09 PM

## 2017-07-14 NOTE — Patient Instructions (Signed)
Wound Closure Removal The staples, stitches, or skin adhesives that were used to close your skin have been removed. You will need to continue the care described here until the wound is completely healed and your health care provider confirms that wound care can be stopped. How do I care for my wound? How you care for your wound after the wound closure has been removed depends on the kind of wound closure you had. Stitches or Staples  Keep the wound site dry and clean. Do not soak it in water.  If skin adhesive strips were applied after the staples were removed, they will begin to peel off in a few days. Allow them to remain in place until they fall off on their own.  If you still have a bandage (dressing), change it at least once a day or as directed by your health care provider. If the dressing sticks, pour warm, sterile water over it until it loosens and can be removed without pulling apart the wound edges. Pat the area dry with a soft, clean towel. Do not rub the wound because that may cause bleeding.  Apply cream or ointment that stops the growth of bacteria (antibacterial cream or antibacterial ointment) only if your health care provider has directed you to do so.  Place a nonstick bandage over the wound to prevent the dressing from sticking.  Cover the nonstick bandage with a new dressing as directed by your health care provider.  If the bandage becomes wet or dirty or it develops a bad smell, change it as soon as possible.  Take medicines only as directed by your health care provider.  Adhesive Strips or Glue  Adhesive strips and glue peel off on their own.  Leave adhesive strips and glue in place until they fall off.  Are there any bathing restrictions once my wound closure is removed? Do not take baths, swim, or use a hot tub until your health care provider approves. How can I decrease the size of my scar? How your scar heals and the size of your scar depend on many factors,  such as your age, the type of scar you have, and genetic factors. The following may help decrease the size of your scar:  Sunscreen. Use sunscreen with a sun protection factor (SPF) of at least 15 when out in the sun. Reapply the sunscreen every two hours.  Friction massage. Once your wound is completely healed, you can gently massage the scarred area. This can decrease scar thickness.  When should I seek help? Seek help if:  You have a fever.  You have chills.  You have drainage, redness, swelling, or pain at your wound.  There is a bad smell coming from your wound.  Your wound edges open up or do not stay closed after the wound closure has been removed.  This information is not intended to replace advice given to you by your health care provider. Make sure you discuss any questions you have with your health care provider. Document Released: 07/30/2008 Document Revised: 01/29/2016 Document Reviewed: 01/02/2014 Elsevier Interactive Patient Education  2018 [employer].

## 2017-07-15 ENCOUNTER — Encounter: Payer: Self-pay | Admitting: Family Medicine

## 2017-07-16 DIAGNOSIS — C44619 Basal cell carcinoma of skin of left upper limb, including shoulder: Secondary | ICD-10-CM | POA: Diagnosis not present

## 2017-07-16 DIAGNOSIS — L905 Scar conditions and fibrosis of skin: Secondary | ICD-10-CM | POA: Diagnosis not present

## 2017-08-06 DIAGNOSIS — H0014 Chalazion left upper eyelid: Secondary | ICD-10-CM | POA: Diagnosis not present

## 2017-08-22 ENCOUNTER — Telehealth: Payer: Self-pay | Admitting: Internal Medicine

## 2017-08-22 MED ORDER — AMOXICILLIN-POT CLAVULANATE 875-125 MG PO TABS: 1.0000 | ORAL_TABLET | Freq: Two times a day (BID) | ORAL | 0 refills | Status: DC

## 2017-08-22 NOTE — Telephone Encounter (Signed)
Patient has had 5-7 days of increasing abdominal pain LLQ and obstipation/constipation Today increase LLQ pain - no fever, and she has started to have some diarrhea  She says this is classic for her attacks of diverticulitis  Last Tx Augmentin x 10 d in April  Will repeat that  She is advised to call us back 12/26 with an update and we can determine need for follow-up at that time - GI vs PCP if at all  In the interim if she is worse call back or be seen in UC/ED

## 2017-08-23 ENCOUNTER — Telehealth: Payer: Self-pay

## 2017-08-23 MED ORDER — METRONIDAZOLE 500 MG PO TABS: 500.0000 mg | ORAL_TABLET | Freq: Three times a day (TID) | ORAL | 0 refills | Status: DC

## 2017-08-23 MED ORDER — CIPROFLOXACIN HCL 500 MG PO TABS: 500.0000 mg | ORAL_TABLET | Freq: Two times a day (BID) | ORAL | 0 refills | Status: DC

## 2017-08-23 NOTE — Telephone Encounter (Signed)
Per note pt was prescribed augmentin by Dr. Carlean Purl, so Rxs were cancelled per Dr. Glori Bickers

## 2017-08-23 NOTE — Telephone Encounter (Signed)
PLEASE NOTE: All timestamps contained within this report are represented as [place] Standard Time. CONFIDENTIALTY NOTICE: This fax transmission is intended only for the addressee. It contains information that is legally privileged, confidential or otherwise protected from use or disclosure. If you are not the intended recipient, you are strictly prohibited from reviewing, disclosing, copying using or disseminating any of this information or taking any action in reliance on or regarding this information. If you have received this fax in error, please notify us immediately by telephone so that we can arrange for its return to [place]. Phone: [phone], Toll-Free: [phone], Fax: [phone] Page: 1 of 2 Call Id: [id number] [place] Patient Name: Judith [name] Gender: Female DOB: 1945-09-21 Age: 71 Y 19 M Return Phone Number: [phone] (Primary) Address: City/State/Zip: [place] [address] Client [place] - Client Client Site [facility], Roque Lias - MD Contact Type Call Who Is Calling Patient / Member / Family / Caregiver Call Type Triage / Clinical Relationship To Patient Self Return Phone Number [phone] (Primary) Chief Complaint Unknown Complaint Reason for Call Symptomatic / Request for [place] thinks she is having a diverticulitis attack. Translation No Nurse Assessment Nurse: Joaquin Music RN, Baker Janus Date/Time Eilene Ghazi Time): 08/21/2017 7:10:37 PM Confirm and document reason for call. If symptomatic, describe symptoms. ---Caller states: Pt has hx diverticulitis. Has had abd. pain for about a week. No C/O diarrhea and bloating, pain is in L side, rates pain as a 3-4. Feels like previous bouts of diverticulitis. Also having chills, hasn't taken temp. No vomiting. No blood in  stool. Last Monday she finished a course of Prednisone for sarcoid. Does the patient have any new or worsening symptoms? ---Yes Will a triage be completed? ---Yes Related visit to physician within the last 2 weeks? ---No Does the PT have any chronic conditions? (i.e. diabetes, asthma, etc.) ---Yes List chronic conditions. ---sarcoid Is this a behavioral health or substance abuse call? ---No Guidelines Guideline Title Affirmed Question Affirmed Notes Nurse Date/Time (Eastern Time) Diarrhea Abdominal pain (Exception: Pain clears with each passage of diarrhea stool) Joaquin Music, RN, Baker Janus 08/21/2017 7:16:06 PM Disp. Time Eilene Ghazi Time) Disposition Final User 08/21/2017 7:25:33 PM See Physician within 24 Hours Yes Joaquin Music, RN, Christin Bach Disagree/Comply Comply PLEASE NOTE: All timestamps contained within this report are represented as [place] Standard Time. CONFIDENTIALTY NOTICE: This fax transmission is intended only for the addressee. It contains information that is legally privileged, confidential or otherwise protected from use or disclosure. If you are not the intended recipient, you are strictly prohibited from reviewing, disclosing, copying using or disseminating any of this information or taking any action in reliance on or regarding this information. If you have received this fax in error, please notify us immediately by telephone so that we can arrange for its return to [place]. Phone: [phone], Toll-Free: [phone], Fax: [phone] Page: 2 of 2 Call Id: [id number] [place] Understands Yes PreDisposition Call Doctor Care Advice Given Per Guideline SEE PHYSICIAN WITHIN 24 HOURS: CLEAR FLUIDS: * Drink more fluids. * Sip water or a half-strength sports drink (Gatorade or Powerade; mix half and half with water) CALL BACK IF: * You become worse. CARE ADVICE given per Diarrhea (Adult) guideline. Referrals REFERRED TO PCP OFFICE

## 2017-08-23 NOTE — Telephone Encounter (Signed)
Please see phone note from Dr Burlene Arnt on 08/22/17.

## 2017-08-23 NOTE — Telephone Encounter (Signed)
I sent cipro and flagyl to [facility]  If worse -go to ED  Otherwise f/u after xmas  Thanks

## 2017-08-27 NOTE — Telephone Encounter (Signed)
Please call patient to determine if her GI symptoms have resolved. If not then schedule REV within 2 weeks.

## 2017-08-27 NOTE — Telephone Encounter (Signed)
The pt states she feels 100 % better and will call back if symptoms return

## 2017-09-16 ENCOUNTER — Ambulatory Visit: Payer: Medicare HMO | Admitting: Internal Medicine

## 2017-09-16 ENCOUNTER — Encounter: Payer: Self-pay | Admitting: Internal Medicine

## 2017-09-16 VITALS — BP 148/98 | HR 88 | Ht 62.0 in

## 2017-09-16 DIAGNOSIS — D8685 Sarcoid myocarditis: Secondary | ICD-10-CM

## 2017-09-16 DIAGNOSIS — D869 Sarcoidosis, unspecified: Secondary | ICD-10-CM

## 2017-09-16 DIAGNOSIS — I259 Chronic ischemic heart disease, unspecified: Secondary | ICD-10-CM | POA: Diagnosis not present

## 2017-09-16 NOTE — Patient Instructions (Signed)
Cardiology referral

## 2017-09-16 NOTE — Progress Notes (Signed)
Name: Judith [name] MRN: [id number] DOB: Jul 21, 1946     CONSULTATION DATE:09/16/2017  REFERRING MD :  Dr. Roxan Hockey  CHIEF COMPLAINT:   Follow-up sarcoid  STUDIES:  CT chest Jan 26, 2017 I have Independently reviewed images of  CT chest   on 09/16/2017 Interpretation:B/l pulm nodules  Previous history 72 year old pleasant white female seen today for abnormal CT chest with bilateral pulmonary nodules Patient has a diagnosis and history of sarcoidosis of the scalp In February 2018 patient developed a chronic cough patient was worked up for chest x-ray and a CT scan of the chest which revealed several pulmonary nodules Patient was then referred to cardiothoracic surgery for further evaluation Dr. 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 Patient sees ophthalmology on an annual basis to assess for sarcoid  At this time patient refuses Plaquenil therapy Patient also refuses immunotherapy  Patient has no signs of infection at this time Patient has no signs of acute heart failure at this time  Patient's cough is persistent and mild but is bothersome for the patient Patient does not have progressive shortness of breath or dyspnea exertion  HISTORY OF PRESENT ILLNESS:   Stopped prednisone 1 month ago She feels much better +back pain and muscle spasms-needs to be seen   +SOB with exertion with elevated HR Needs to see  cardiology   No signs of infection at this time No signs of distress at this time  her cough has improved significantly  Allergies  Allergen Reactions  . Niacin And Related Other (See Comments)    Caused black and blue marks all over body  . Meloxicam Rash    REVIEW OF SYSTEMS:   Constitutional: Negative for fever, chills, weight loss, +malaise/fatigue  HENT: Negative for hearing loss, ear pain, nosebleeds, congestion, sore throat, neck pain, tinnitus and ear discharge.   Eyes: Negative for blurred vision, double vision, photophobia, pain, discharge and redness.  Respiratory: -cough, -hemoptysis, -sputum production, --shortness of breath, wheezing and stridor.   Cardiovascular: Negative for chest pain, palpitations, orthopnea, claudication, leg swelling and PND.  ALL OTHER ROS ARE NEGATIVE   BP (!) 148/98 (BP Location: Left Arm, Cuff Size: Normal)   Pulse 88   Ht 5' 2"  (1.575 m)   SpO2 97%   BMI 29.40 kg/m    Physical Examination:   GENERAL:NAD, no fevers, chills, no weakness no fatigue HEAD: Normocephalic, atraumatic.  EYES: Pupils equal, round, reactive to light. Extraocular muscles intact. No scleral icterus.  MOUTH: Moist mucosal membrane.   EAR, NOSE, THROAT: Clear without exudates. No external lesions.  NECK: Supple. No thyromegaly. No nodules. No JVD.  PULMONARY:CTA B/L no wheezes, no crackles, no rhonchi CARDIOVASCULAR: S1 and S2. Regular rate and rhythm. No murmurs, rubs, or gallops. No edema.  SKIN: + ulceration on scalp, +lesions on scalp   PSYCHIATRIC: Mood, affect within normal limits. The patient is awake, alert and oriented x 3. Insight, judgment intact.     ASSESSMENT / PLAN:  72 year old pleasant white female  seen today for chronic cough patient with a history of cutaneous sarcoidosis and now presents with bilateral pulmonary nodules s/p  navigational bronchoscopy and findings consistent with an highly suspicious for pulmonary sarcoidosis,    At this  time patient has been weaned off prednisone and doing well from Pulmonary point of view however, patient needs cardiology assessment to assess for cardiac sarcoidosis in setting of elevated hr with SOB with exertion that is new from last OV   Patient does not want to restart Plaquenil nor does she want to start immunotherapy, patient does not want cough suppressant at this time    #1 follow-up dermatology #2 follow-up ophthalmology #3 cardiology referral-assess cardiac sardcoidosis  No need for steroids at this time.   Patient  satisfied with Plan of action and management. All questions answered  follow-up in 6 months  Suleyman Ehrman Patricia Pesa, M.D.  [facility]  Medical Director [place] Director [facility] Cardio-Pulmonary Department

## 2017-09-29 DIAGNOSIS — R69 Illness, unspecified: Secondary | ICD-10-CM | POA: Diagnosis not present

## 2017-09-30 ENCOUNTER — Telehealth: Payer: Self-pay

## 2017-09-30 NOTE — Telephone Encounter (Signed)
Attempted to reach patient to schedule AWV and CPE appointments. Left message with contact info on mobile phone.

## 2017-10-06 ENCOUNTER — Ambulatory Visit (INDEPENDENT_AMBULATORY_CARE_PROVIDER_SITE_OTHER)
Admission: RE | Admit: 2017-10-06 | Discharge: 2017-10-06 | Disposition: A | Discharge status: Home / Self Care | Payer: Medicare HMO | Source: Ambulatory Visit | Attending: Family Medicine | Admitting: Family Medicine

## 2017-10-06 ENCOUNTER — Ambulatory Visit (INDEPENDENT_AMBULATORY_CARE_PROVIDER_SITE_OTHER): Payer: Medicare HMO | Admitting: Family Medicine

## 2017-10-06 ENCOUNTER — Encounter: Payer: Self-pay | Admitting: Family Medicine

## 2017-10-06 VITALS — BP 110/72 | HR 82 | Temp 98.4°F | Ht 62.0 in | Wt 162.5 lb

## 2017-10-06 DIAGNOSIS — M545 Low back pain, unspecified: Secondary | ICD-10-CM | POA: Insufficient documentation

## 2017-10-06 DIAGNOSIS — G8929 Other chronic pain: Secondary | ICD-10-CM

## 2017-10-06 DIAGNOSIS — M5441 Lumbago with sciatica, right side: Secondary | ICD-10-CM | POA: Diagnosis not present

## 2017-10-06 NOTE — Progress Notes (Signed)
Subjective:    Patient ID: Judith [name], female    DOB: 1946-01-05, 72 y.o.   MRN: [id number]  HPI Here for back pain with leg tingling for 2 months  Notices that when she walks over a mild - notes pain in R low back and tingling in R foot  Back has been "out" a lot this year  Methocarbamol helps with an anti inflammatory (advil) - helps prevent spasms (2 severe episodes in the past year)  Can feel R side tightening up - stood for several hours today  Was on prednisone June thru dec for sarcoid and back was better   Trouble on and off for years  Has done PT (last time she went it made her worse)  Has never seen a spine specialist  Never had surgery   Stretching and reaching makes her back worse  Bent over to reach something recently set it off - 1 mo ago  Helps to lie on side with pillow between knees     Wt Readings from Last 3 Encounters:  10/06/17 162 lb 8 oz (73.7 kg)  07/14/17 160 lb 12 oz (72.9 kg)  07/05/17 150 lb (68 kg)  gained wt  Not as much exercise -had to quit silver sneakers (does some wts at home)  29.72 kg/m   Sarcoid - pulmonary ref her to cardiology to check her heart  Cough is improved   Review of Systems     Objective:   Physical Exam  Constitutional: She appears well-developed and well-nourished. No distress.  obese and well appearing   HENT:  Head: Normocephalic and atraumatic.  Eyes: Conjunctivae and EOM are normal. Pupils are equal, round, and reactive to light. No scleral icterus.  Neck: Normal range of motion. Neck supple.  Cardiovascular: Normal rate and regular rhythm.  Pulmonary/Chest: Effort normal and breath sounds normal. She has no wheezes. She has no rales.  Abdominal: Soft. Bowel sounds are normal. She exhibits no distension. There is no tenderness.  Musculoskeletal: She exhibits tenderness. She exhibits no edema.       Right shoulder: She exhibits tenderness. She exhibits normal range of motion, no bony tenderness, no  swelling, no crepitus, no deformity, no spasm, normal pulse and normal strength.       Lumbar back: She exhibits decreased range of motion, tenderness and spasm. She exhibits no bony tenderness and no edema.  No scoliosis or lordosis  No spinous process tenderness Tender over R>L lumbar musculature Nl piriformis fxn and rom of hips  Nl rom of spine (some pain with R lat bend) Nl gait  Neg slr/no neuro changes   Lymphadenopathy:    She has no cervical adenopathy.  Neurological: She is alert. She has normal strength and normal reflexes. She displays no atrophy. No cranial nerve deficit or sensory deficit. She exhibits normal muscle tone. Coordination normal.  Negative SLR  Skin: Skin is warm and dry. No rash noted. No erythema. No pallor.  Psychiatric: She has a normal mood and affect.          Assessment & Plan:   Problem List Items Addressed This Visit      Other   RESOLVED: Low back pain   Relevant Orders   DG Lumbar Spine Complete   Low back pain - Primary    Acute on chronic  Now with sciatic symptoms to R leg  Reassuring exam  Continue methocarbamol  Continue heat prn  nsaid (or voltaren gel) prn if helpful Given  stretches (no imp with PT in the past however) xr today  May consider spine specialist if unable to improve       Relevant Orders   DG Lumbar Spine Complete

## 2017-10-06 NOTE — Patient Instructions (Signed)
Use heat on back as needed for 10 minutes at a time  A warm bath is good also (careful not to fall) Take the methocarbamol (muscle relaxer) as needed  Take the ibuprofen (advil) as needed with food   Let's get an xray today  Then make a long term plan

## 2017-10-06 NOTE — Assessment & Plan Note (Signed)
Acute on chronic  Now with sciatic symptoms to R leg  Reassuring exam  Continue methocarbamol  Continue heat prn  nsaid (or voltaren gel) prn if helpful Given stretches (no imp with PT in the past however) xr today  May consider spine specialist if unable to improve

## 2017-10-08 ENCOUNTER — Telehealth: Payer: Self-pay | Admitting: Family Medicine

## 2017-10-08 DIAGNOSIS — G8929 Other chronic pain: Secondary | ICD-10-CM

## 2017-10-08 DIAGNOSIS — M5441 Lumbago with sciatica, right side: Principal | ICD-10-CM

## 2017-10-08 NOTE — Telephone Encounter (Signed)
Ref done  Will route to [facility] 

## 2017-10-12 NOTE — Telephone Encounter (Signed)
Appt made and patient is aware.

## 2017-10-14 DIAGNOSIS — M549 Dorsalgia, unspecified: Secondary | ICD-10-CM | POA: Insufficient documentation

## 2017-10-14 DIAGNOSIS — M4316 Spondylolisthesis, lumbar region: Secondary | ICD-10-CM | POA: Insufficient documentation

## 2017-10-14 DIAGNOSIS — M546 Pain in thoracic spine: Secondary | ICD-10-CM | POA: Diagnosis not present

## 2017-10-20 ENCOUNTER — Ambulatory Visit: Payer: Medicare HMO | Admitting: Cardiovascular Disease

## 2017-10-23 DIAGNOSIS — R918 Other nonspecific abnormal finding of lung field: Secondary | ICD-10-CM | POA: Insufficient documentation

## 2017-10-23 NOTE — Progress Notes (Signed)
Cardiology Office Note  Date:  10/25/2017   ID:  Judith [name], DOB 07-19-1946, MRN [id number]  PCP:  Abner Greenspan, MD   Chief Complaint  Patient presents with  . New Patient (Initial Visit)    Per Corbin Ade for cardiology assessment to assess for cardiac sarcoidosis and elevated HR with SOB with exertion. Meds reviewed verbally with patient.     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 Mortimer Fries for SOB, tachycardia  In follow-up today she reports that she was previously doing well on prednisone Cough went away More recently feels that cough is starting to come back No significant wheezing off Plaquenil for the last 8 months  Was at a stadium in [place] [facility] for a concert walked up the stadium steps long way had shortness of breath and tachycardia Typically on flat surfaces able to walk without shortness of breath or tachycardia Only seems to happen with hills and stairs  Previously used to exercise, none recently   CT chest 12/2016, images reviewed with her in detail on today's visit  Lung nodules No significant coronary calcification, no aortic atherosclerosis    Resting heart rate around 60 to 70 BPM With walking heart rate up to  120 to 130 Does not really know what her heart rate is with hills or stairs, likely even higher  EKG personally reviewed by myself on todays visit Shows normal sinus rhythm with rate 78 bpm no significant ST or T wave changes   PMH:   has a past medical history of Allergic rhinitis, Allergy, Arthritis, Atrophic vaginitis, Breast cancer ([place]), Diverticulosis, GERD  (gastroesophageal reflux disease), H/O: pneumonia, Headache, History of shingles, HLD (hyperlipidemia), IBS (irritable bowel syndrome), Osteopenia, Sarcoidosis, and Sliding hiatal hernia.  PSH:    Past Surgical History:  Procedure Laterality Date  . APPENDECTOMY    . BASAL CELL CARCINOMA EXCISION     scalp  . BASAL CELL CARCINOMA EXCISION  08/2016   nose  . BREAST LUMPECTOMY    . COLONOSCOPY  02/2010   divertics, rpt 5 years  . DEXA  2005   osteopenia, no change from 2003  . DILATION AND CURETTAGE OF UTERUS  10/1999  . LAPAROSCOPIC TOTAL HYSTERECTOMY  10/03   for fibroids/polyp  . MASTECTOMY  7/10   right  . POLYPECTOMY    . TOTAL ABDOMINAL HYSTERECTOMY  2003   for fibroids/polyps  . VIDEO BRONCHOSCOPY WITH ENDOBRONCHIAL NAVIGATION N/A 02/01/2017   Procedure: VIDEO BRONCHOSCOPY WITH ENDOBRONCHIAL NAVIGATION;  Surgeon: Melrose Nakayama, MD;  Location: [facility] OR;  Service: Thoracic;  Laterality: N/A;    Current Outpatient Medications  Medication Sig Dispense Refill  . calcium-vitamin D (OSCAL WITH D) 500-200 MG-UNIT per tablet Take 1 tablet by mouth once a week.     . diclofenac sodium (VOLTAREN) 1 % GEL Apply 2 g topically 3 (three) times daily as needed. 1 Tube 0  . FIBER SELECT GUMMIES PO Take 2 tablets by mouth at bedtime.    . fish oil-omega-3 fatty acids 1000 MG capsule Take 1 g by mouth once a week.     Marland Kitchen ibuprofen (ADVIL,MOTRIN) 200 MG  tablet Take 400 mg by mouth every 8 (eight) hours as needed.     . methocarbamol (ROBAXIN) 500 MG tablet TAKE 1 TABLET BY MOUTH EVERY 8 HOURS AS NEEDED FOR MUSCLE SPASMS. 30 tablet 3  . Multiple Vitamin (MULTIVITAMIN WITH MINERALS) TABS tablet Take 1 tablet by mouth at bedtime.    Marland Kitchen neomycin-polymyxin-dexameth (MAXITROL) 0.1 % OINT Place 1 application into both eyes See admin instructions. TWICE DAILY X1 WEEK & THEN DECREASE TO ONCE DAILY FOR X2 WEEKS    . omeprazole (PRILOSEC) 20 MG capsule TAKE ONE CAPSULE BY MOUTH DAILY AS NEEDED (Patient  taking differently: Take 20 mg by mouth daily before breakfast. TAKE ONE CAPSULE BY MOUTH DAILY AS NEEDED) 30 capsule 11   No current facility-administered medications for this visit.      Allergies:   Niacin and related and Meloxicam   Social History:  The patient  reports that she quit smoking about 49 years ago. she has never used smokeless tobacco. She reports that she drinks alcohol. She reports that she does not use drugs.   Family History:   family history includes Heart attack in her paternal grandmother; Lymphoma in her brother; Prostate cancer in her brother and father; Stomach cancer in her maternal grandfather.    Review of Systems: Review of Systems  Constitutional: Negative.   Respiratory: Negative.   Cardiovascular: Negative.   Gastrointestinal: Negative.   Musculoskeletal: Negative.   Neurological: Negative.   Psychiatric/Behavioral: Negative.   All other systems reviewed and are negative.    PHYSICAL EXAM: VS:  BP 140/74 (BP Location: Left Arm, Patient Position: Sitting, Cuff Size: Normal)   Pulse 78   Ht 5' 1.5" (1.562 m)   Wt 163 lb (73.9 kg)   BMI 30.30 kg/m  , BMI Body mass index is 30.3 kg/m. GEN: Well nourished, well developed, in no acute distress  HEENT: normal  Neck: no JVD, carotid bruits, or masses Cardiac: RRR; no murmurs, rubs, or gallops,no edema  Respiratory:  clear to auscultation bilaterally, normal work of breathing GI: soft, nontender, nondistended, + BS MS: no deformity or atrophy  Skin: warm and dry, no rash Neuro:  Strength and sensation are intact Psych: euthymic mood, full affect    Recent Labs: 12/23/2016: TSH 2.18 01/29/2017: ALT 30; BUN 12; Creatinine, Ser 0.73; Hemoglobin 14.6; Platelets 318; Potassium 3.9; Sodium 141    Lipid Panel Lab Results  Component Value Date   CHOL 200 12/23/2016   HDL 52.70 12/23/2016   LDLCALC 119 (H) 12/23/2016   TRIG 144.0 12/23/2016      Wt Readings from Last 3 Encounters:  10/25/17  163 lb (73.9 kg)  10/06/17 162 lb 8 oz (73.7 kg)  07/14/17 160 lb 12 oz (72.9 kg)       ASSESSMENT AND PLAN:  Sarcoid - Plan: EKG 12-Lead, ECHOCARDIOGRAM COMPLETE Previously treated with prednisone and Plaquenil Currently not on medications Feels cough may be coming back though unclear if this is allergies  Multiple lung nodules on CT - Plan: EKG 12-Lead, ECHOCARDIOGRAM COMPLETE Seen on CT scan May 2018 No repeat CT scan since that time  Shortness of breath Likely secondary to obesity, deconditioning, sedentary lifestyle Recommend echocardiogram to rule out structural heart disease in the setting of diagnosis of sarcoid  Sinus tachycardia Sinus tachycardia could be secondary to deconditioning Worse with hills and stairs, adequate on flat surface She does not want beta-blockers Recommend echocardiogram as above to rule out structural heart disease  Disposition:   F/U  as needed We will call with the results of her echocardiogram   Total encounter time more than 45 minutes  Greater than 50% was spent in counseling and coordination of care with the patient    Orders Placed This Encounter  Procedures  . EKG 12-Lead  . ECHOCARDIOGRAM COMPLETE     Signed, Esmond Plants, M.D., Ph.D. 10/25/2017  [place], [place]

## 2017-10-25 ENCOUNTER — Ambulatory Visit: Payer: Medicare HMO | Admitting: Cardiovascular Disease

## 2017-10-25 ENCOUNTER — Encounter: Payer: Self-pay | Admitting: Cardiovascular Disease

## 2017-10-25 VITALS — BP 140/74 | HR 78 | Ht 61.5 in | Wt 163.0 lb

## 2017-10-25 DIAGNOSIS — R918 Other nonspecific abnormal finding of lung field: Secondary | ICD-10-CM | POA: Diagnosis not present

## 2017-10-25 DIAGNOSIS — R0602 Shortness of breath: Secondary | ICD-10-CM

## 2017-10-25 DIAGNOSIS — R000 Tachycardia, unspecified: Secondary | ICD-10-CM | POA: Insufficient documentation

## 2017-10-25 DIAGNOSIS — D869 Sarcoidosis, unspecified: Secondary | ICD-10-CM

## 2017-10-25 DIAGNOSIS — R0600 Dyspnea, unspecified: Secondary | ICD-10-CM | POA: Insufficient documentation

## 2017-10-25 NOTE — Patient Instructions (Signed)
Medication Instructions:   No medication changes made  Labwork:  No new labs needed  Testing/Procedures:  We will order an echocardiogram for sarcoid and tachycardia, shortness of breath   Follow-Up: It was a pleasure seeing you in the office today. Please call us if you have new issues that need to be addressed before your next appt.  [phone]  Your physician wants you to follow-up in:  As needed  If you need a refill on your cardiac medications before your next appointment, please call your pharmacy.  For educational health videos Log in to : [email or web address] Or : [email or web address], password : [email or web address]

## 2017-10-27 ENCOUNTER — Other Ambulatory Visit: Payer: Self-pay | Admitting: Cardiovascular Disease

## 2017-10-27 DIAGNOSIS — R0600 Dyspnea, unspecified: Secondary | ICD-10-CM

## 2017-10-27 DIAGNOSIS — R0609 Other forms of dyspnea: Principal | ICD-10-CM

## 2017-10-27 DIAGNOSIS — D8685 Sarcoid myocarditis: Secondary | ICD-10-CM

## 2017-11-01 DIAGNOSIS — M546 Pain in thoracic spine: Secondary | ICD-10-CM | POA: Diagnosis not present

## 2017-11-02 DIAGNOSIS — H0013 Chalazion right eye, unspecified eyelid: Secondary | ICD-10-CM | POA: Diagnosis not present

## 2017-11-05 ENCOUNTER — Ambulatory Visit (INDEPENDENT_AMBULATORY_CARE_PROVIDER_SITE_OTHER): Payer: Medicare HMO

## 2017-11-05 ENCOUNTER — Other Ambulatory Visit: Payer: Self-pay

## 2017-11-05 DIAGNOSIS — D8685 Sarcoid myocarditis: Secondary | ICD-10-CM

## 2017-11-05 DIAGNOSIS — R0600 Dyspnea, unspecified: Secondary | ICD-10-CM

## 2017-11-05 DIAGNOSIS — R0609 Other forms of dyspnea: Secondary | ICD-10-CM

## 2017-11-09 ENCOUNTER — Telehealth: Payer: Self-pay | Admitting: Cardiovascular Disease

## 2017-11-09 NOTE — Telephone Encounter (Signed)
Reviewed that results are still pending physician review and that I would give her a call once received. She verbalized understanding with no further questions.

## 2017-11-09 NOTE — Telephone Encounter (Signed)
Pt calling asking about her echo she did this past Friday  Please advise

## 2017-11-11 ENCOUNTER — Telehealth: Payer: Self-pay | Admitting: Cardiovascular Disease

## 2017-11-11 ENCOUNTER — Encounter: Payer: Self-pay | Admitting: Cardiovascular Disease

## 2017-11-11 NOTE — Telephone Encounter (Signed)
Good morning Dr. Rockey Situ-   This patient has called a couple of times now asking for her echo results- please review.  Thanks!

## 2017-11-11 NOTE — Telephone Encounter (Signed)
Please call regarding echo results.

## 2017-11-15 NOTE — Telephone Encounter (Signed)
See results note. Spoke with patient and reviewed results and recommendations. She verbalized understanding with no further questions.

## 2017-12-03 DIAGNOSIS — D2261 Melanocytic nevi of right upper limb, including shoulder: Secondary | ICD-10-CM | POA: Diagnosis not present

## 2017-12-03 DIAGNOSIS — Z872 Personal history of diseases of the skin and subcutaneous tissue: Secondary | ICD-10-CM | POA: Diagnosis not present

## 2017-12-03 DIAGNOSIS — L570 Actinic keratosis: Secondary | ICD-10-CM | POA: Diagnosis not present

## 2017-12-03 DIAGNOSIS — Z09 Encounter for follow-up examination after completed treatment for conditions other than malignant neoplasm: Secondary | ICD-10-CM | POA: Diagnosis not present

## 2017-12-03 DIAGNOSIS — Z85828 Personal history of other malignant neoplasm of skin: Secondary | ICD-10-CM | POA: Diagnosis not present

## 2017-12-03 DIAGNOSIS — D2271 Melanocytic nevi of right lower limb, including hip: Secondary | ICD-10-CM | POA: Diagnosis not present

## 2017-12-03 DIAGNOSIS — Z08 Encounter for follow-up examination after completed treatment for malignant neoplasm: Secondary | ICD-10-CM | POA: Diagnosis not present

## 2017-12-03 DIAGNOSIS — D2272 Melanocytic nevi of left lower limb, including hip: Secondary | ICD-10-CM | POA: Diagnosis not present

## 2017-12-03 DIAGNOSIS — D2262 Melanocytic nevi of left upper limb, including shoulder: Secondary | ICD-10-CM | POA: Diagnosis not present

## 2017-12-03 DIAGNOSIS — D225 Melanocytic nevi of trunk: Secondary | ICD-10-CM | POA: Diagnosis not present

## 2017-12-23 ENCOUNTER — Telehealth: Payer: Self-pay | Admitting: Family Medicine

## 2017-12-23 DIAGNOSIS — Z0000 Encounter for general adult medical examination without abnormal findings: Secondary | ICD-10-CM

## 2017-12-23 DIAGNOSIS — E7800 Pure hypercholesterolemia, unspecified: Secondary | ICD-10-CM

## 2017-12-23 DIAGNOSIS — K760 Fatty (change of) liver, not elsewhere classified: Secondary | ICD-10-CM

## 2017-12-23 NOTE — Telephone Encounter (Signed)
-----   Message from Eustace Pen, LPN sent at 6/46/8032 11:59 AM EDT ----- Regarding: Labs 4/26 Lab orders needed. Thank you.  Insurance:  [employer]

## 2017-12-24 ENCOUNTER — Ambulatory Visit (INDEPENDENT_AMBULATORY_CARE_PROVIDER_SITE_OTHER): Payer: Medicare HMO

## 2017-12-24 ENCOUNTER — Ambulatory Visit: Payer: Medicare HMO

## 2017-12-24 VITALS — BP 110/82 | HR 82 | Temp 98.4°F | Ht 60.5 in | Wt 158.0 lb

## 2017-12-24 DIAGNOSIS — E7800 Pure hypercholesterolemia, unspecified: Secondary | ICD-10-CM

## 2017-12-24 DIAGNOSIS — K760 Fatty (change of) liver, not elsewhere classified: Secondary | ICD-10-CM

## 2017-12-24 DIAGNOSIS — Z0000 Encounter for general adult medical examination without abnormal findings: Secondary | ICD-10-CM

## 2017-12-24 LAB — CBC WITH DIFFERENTIAL/PLATELET
BASOS PCT: 0.7 % (ref 0.0–3.0)
Basophils Absolute: 0 10*3/uL (ref 0.0–0.1)
EOS PCT: 4.3 % (ref 0.0–5.0)
Eosinophils Absolute: 0.3 10*3/uL (ref 0.0–0.7)
HCT: 45.2 % (ref 36.0–46.0)
Hemoglobin: 15.2 g/dL — ABNORMAL HIGH (ref 12.0–15.0)
LYMPHS ABS: 1.8 10*3/uL (ref 0.7–4.0)
Lymphocytes Relative: 27 % (ref 12.0–46.0)
MCHC: 33.6 g/dL (ref 30.0–36.0)
MCV: 91.6 fl (ref 78.0–100.0)
Monocytes Absolute: 0.5 10*3/uL (ref 0.1–1.0)
Monocytes Relative: 8.2 % (ref 3.0–12.0)
NEUTROS ABS: 3.9 10*3/uL (ref 1.4–7.7)
NEUTROS PCT: 59.8 % (ref 43.0–77.0)
PLATELETS: 319 10*3/uL (ref 150.0–400.0)
RBC: 4.93 Mil/uL (ref 3.87–5.11)
RDW: 14.4 % (ref 11.5–15.5)
WBC: 6.6 10*3/uL (ref 4.0–10.5)

## 2017-12-24 LAB — LIPID PANEL
Cholesterol: 185 mg/dL (ref 0–200)
HDL: 53.2 mg/dL (ref >39.00)
LDL CALC: 109 mg/dL — AB (ref 0–99)
NONHDL: 131.41
Total CHOL/HDL Ratio: 3
Triglycerides: 111 mg/dL (ref 0.0–149.0)
VLDL: 22.2 mg/dL (ref 0.0–40.0)

## 2017-12-24 LAB — COMPREHENSIVE METABOLIC PANEL
ALT: 24 U/L (ref 0–35)
AST: 18 U/L (ref 0–37)
Albumin: 4.2 g/dL (ref 3.5–5.2)
Alkaline Phosphatase: 72 U/L (ref 39–117)
BUN: 20 mg/dL (ref 6–23)
CHLORIDE: 105 meq/L (ref 96–112)
CO2: 26 mEq/L (ref 19–32)
Calcium: 9.3 mg/dL (ref 8.4–10.5)
Creatinine, Ser: 0.81 mg/dL (ref 0.40–1.20)
GFR: 73.7 mL/min (ref >60.00)
GLUCOSE: 94 mg/dL (ref 70–99)
POTASSIUM: 4.3 meq/L (ref 3.5–5.1)
SODIUM: 141 meq/L (ref 135–145)
Total Bilirubin: 0.4 mg/dL (ref 0.2–1.2)
Total Protein: 7.1 g/dL (ref 6.0–8.3)

## 2017-12-24 LAB — TSH: TSH: 2.47 u[IU]/mL (ref 0.35–4.50)

## 2017-12-24 NOTE — Progress Notes (Signed)
PCP notes:   Health maintenance:  No gaps identified.  Abnormal screenings:   Fall risk - hx of single fall Fall Risk  12/24/2017 12/23/2016 12/12/2015 12/05/2014 10/02/2013  Falls in the past year? Yes No No No Yes  Comment accident fall in bathtub; treatment in ER - - - -  Number falls in past yr: 1 - - - 1  Injury with Fall? Yes - - - No    Patient concerns:   Patient reports painful nodules on 1st digit of right hand. Additionally, patient states joint pain is becoming increasingly worse.  Nurse concerns:  None  Next PCP appt:   01/07/18 @ 1430  I reviewed health advisor's note, was available for consultation, and agree with documentation and plan. Loura Pardon MD

## 2017-12-24 NOTE — Progress Notes (Signed)
Subjective:   Judith [name] is a 72 y.o. female who presents for Medicare Annual (Subsequent) preventive examination.  Review of Systems:  N/A Cardiac Risk Factors include: advanced age (>36mn, >>21women);dyslipidemia;obesity (BMI >30kg/m2)     Objective:     Vitals: BP 110/82 (BP Location: Left Arm, Patient Position: Sitting, Cuff Size: Normal)   Pulse 82   Temp 98.4 F (36.9 C) (Oral)   Ht 5' 0.5" (1.537 m) Comment: no shoes  Wt 158 lb (71.7 kg)   SpO2 94%   BMI 30.35 kg/m   Body mass index is 30.35 kg/m.  Advanced Directives 12/24/2017 02/01/2017 01/29/2017 12/23/2016 12/12/2015 07/29/2015 06/27/2015  Does Patient Have a Medical Advance Directive? Yes - Yes Yes Yes Yes Yes  Type of AParamedicof ADexterLiving will - HB[place]Living will HRolling MeadowsLiving will HM[place] ValleyLiving will Healthcare Power of [place]Living will  Does patient want to make changes to medical advance directive? - - - - No - Patient declined - -  Copy of [place] Chart? No - copy requested No - copy requested No - copy requested No - copy requested No - copy requested - -    Tobacco Social History   Tobacco Use  Smoking Status Former Smoker  . Last attempt to quit: 08/31/1968  . Years since quitting: 49.3  Smokeless Tobacco Never Used     Counseling given: No   Clinical Intake:  Pre-visit preparation completed: Yes  Pain : No/denies pain Pain Score: 0-No pain     Nutritional Status: BMI > 30  Obese Nutritional Risks: None Diabetes: No  How often do you need to have someone help you when you read instructions, pamphlets, or other written materials from your doctor or pharmacy?: 1 - Never What is the last grade level you completed in school?: Bachelors degree  Interpreter Needed?: No  Comments: pt lives with spouse Information entered by :: LPinson,  LPN  Past Medical History:  Diagnosis Date  . Allergic rhinitis   . Allergy   . Arthritis   . Atrophic vaginitis   . Breast cancer ([place]    Right mastectomy  . Diverticulosis   . GERD (gastroesophageal reflux disease)   . H/O: pneumonia    as child-viral pneumonia  . Headache   . History of shingles   . HLD (hyperlipidemia)    pt denies  . IBS (irritable bowel syndrome)   . Osteopenia   . Sarcoidosis   . Sliding hiatal hernia    Past Surgical History:  Procedure Laterality Date  . APPENDECTOMY    . BASAL CELL CARCINOMA EXCISION     scalp  . BASAL CELL CARCINOMA EXCISION  08/2016   nose  . BREAST LUMPECTOMY    . COLONOSCOPY  02/2010   divertics, rpt 5 years  . DEXA  2005   osteopenia, no change from 2003  . DILATION AND CURETTAGE OF UTERUS  10/1999  . LAPAROSCOPIC TOTAL HYSTERECTOMY  10/03   for fibroids/polyp  . MASTECTOMY  7/10   right  . POLYPECTOMY    . TOTAL ABDOMINAL HYSTERECTOMY  2003   for fibroids/polyps  . VIDEO BRONCHOSCOPY WITH ENDOBRONCHIAL NAVIGATION N/A 02/01/2017   Procedure: VIDEO BRONCHOSCOPY WITH ENDOBRONCHIAL NAVIGATION;  Surgeon: HMelrose Nakayama MD;  [facility];  Service: Thoracic;  Laterality: N/A;   Family History  Problem Relation Age of Onset  . Prostate cancer Father   .  Prostate cancer Brother   . Lymphoma Brother   . Heart attack Paternal Grandmother   . Stomach cancer Maternal Grandfather   . Colon cancer Neg Hx   . Rectal cancer Neg Hx   . Esophageal cancer Neg Hx    Social History   Socioeconomic History  . Marital status: Married    Spouse name: Not on file  . Number of children: 2  . Years of education: Not on file  . Highest education level: Not on file  Occupational History  . Occupation: Retired  Scientific laboratory technician  . Financial resource strain: Not on file  . Food insecurity:    Worry: Not on file    Inability: Not on file  . Transportation needs:    Medical: Not on file    Non-medical: Not on file    Tobacco Use  . Smoking status: Former Smoker    Last attempt to quit: 08/31/1968    Years since quitting: 49.3  . Smokeless tobacco: Never Used  Substance and Sexual Activity  . Alcohol use: Yes    Alcohol/week: 0.0 oz    Comment: 1-2 glasses of wine/weekly  . Drug use: No  . Sexual activity: Yes  Lifestyle  . Physical activity:    Days per week: Not on file    Minutes per session: Not on file  . Stress: Not on file  Relationships  . Social connections:    Talks on phone: Not on file    Gets together: Not on file    Attends religious service: Not on file    Active member of club or organization: Not on file    Attends meetings of clubs or organizations: Not on file    Relationship status: Not on file  Other Topics Concern  . Not on file  Social History Narrative   Married      2 sons      Retired      2 cups coffee/daily      Gym 3-4 times/week    Outpatient Encounter Medications as of 12/24/2017  Medication Sig  . calcium-vitamin D (OSCAL WITH D) 500-200 MG-UNIT per tablet Take 1 tablet by mouth once a week.   . diclofenac sodium (VOLTAREN) 1 % GEL Apply 2 g topically 3 (three) times daily as needed.  Marland Kitchen FIBER SELECT GUMMIES PO Take 2 tablets by mouth at bedtime.  . fish oil-omega-3 fatty acids 1000 MG capsule Take 1 g by mouth once a week.   Marland Kitchen ibuprofen (ADVIL,MOTRIN) 200 MG tablet Take 400 mg by mouth every 8 (eight) hours as needed.   . methocarbamol (ROBAXIN) 500 MG tablet TAKE 1 TABLET BY MOUTH EVERY 8 HOURS AS NEEDED FOR MUSCLE SPASMS.  . Multiple Vitamin (MULTIVITAMIN WITH MINERALS) TABS tablet Take 1 tablet by mouth at bedtime.  Marland Kitchen neomycin-polymyxin-dexameth (MAXITROL) 0.1 % OINT Place 1 application into both eyes See admin instructions. TWICE DAILY X1 WEEK & THEN DECREASE TO ONCE DAILY FOR X2 WEEKS  . omeprazole (PRILOSEC) 20 MG capsule TAKE ONE CAPSULE BY MOUTH DAILY AS NEEDED (Patient taking differently: Take 20 mg by mouth daily before breakfast. TAKE ONE  CAPSULE BY MOUTH DAILY AS NEEDED)   No facility-administered encounter medications on file as of 12/24/2017.     Activities of Daily Living In your present state of health, do you have any difficulty performing the following activities: 12/24/2017 01/29/2017  Hearing? N N  Vision? Y Y  Comment developing cataracts -  Difficulty concentrating or making  decisions? N N  Walking or climbing stairs? Y N  Dressing or bathing? N N  Doing errands, shopping? N N  Preparing Food and eating ? N -  Using the Toilet? N -  In the past six months, have you accidently leaked urine? Y -  Do you have problems with loss of bowel control? N -  Managing your Medications? N -  Managing your Finances? N -  Housekeeping or managing your Housekeeping? N -  Some recent data might be hidden    Patient Care Team: Tower, Wynelle Fanny, MD as PCP - General Oneta Rack, MD as Consulting Physician (Dermatology)    Assessment:   This is a routine wellness examination for [place].   Hearing Screening   125Hz  250Hz  500Hz  1000Hz  2000Hz  3000Hz  4000Hz  6000Hz  8000Hz   Right ear:   40 40 40  40    Left ear:   40 40 40  40    Vision Screening Comments: Last vision exam in May 2018 @ [facility]   Exercise Activities and Dietary recommendations Current Exercise Habits: Home exercise routine, Type of exercise: walking;Other - see comments(back exercises), Time (Minutes): 20, Frequency (Times/Week): 4, Weekly Exercise (Minutes/Week): 80, Intensity: Mild, Exercise limited by: None identified  Goals    . Increase physical activity     When tolerated, I will continue to walk for 20 minutes 3-4 days per week and to resume Silver Sneakers.        Fall Risk Fall Risk  12/24/2017 12/23/2016 12/12/2015 12/05/2014 10/02/2013  Falls in the past year? Yes No No No Yes  Comment accident fall in bathtub; treatment in ER - - - -  Number falls in past yr: 1 - - - 1  Injury with Fall? Yes - - - No   Depression Screen PHQ  2/9 Scores 12/24/2017 12/23/2016 12/12/2015 12/05/2014  PHQ - 2 Score 0 0 0 0  PHQ- 9 Score 0 - - -     Cognitive Function MMSE - Mini Mental State Exam 12/24/2017 12/23/2016 12/12/2015  Orientation to time 5 5 5   Orientation to Place 5 5 5   Registration 3 3 3   Attention/ Calculation 0 0 0  Recall 3 3 3   Language- name 2 objects 0 0 0  Language- repeat 1 1 1   Language- follow 3 step command 3 3 3   Language- read & follow direction 0 0 0  Write a sentence 0 0 0  Copy design 0 0 0  Total score 20 20 20      PLEASE NOTE: A Mini-Cog screen was completed. Maximum score is 20. A value of 0 denotes this part of Folstein MMSE was not completed or the patient failed this part of the Mini-Cog screening.   Mini-Cog Screening Orientation to Time - Max 5 pts Orientation to Place - Max 5 pts Registration - Max 3 pts Recall - Max 3 pts Language Repeat - Max 1 pts Language Follow 3 Step Command - Max 3 pts    Immunization History  Administered Date(s) Administered  . Influenza Split 06/24/2011, 06/08/2012  . Influenza Whole 07/08/2007, 06/11/2008, 06/19/2009, 06/04/2010  . Influenza,inj,Quad PF,6+ Mos 06/15/2013, 06/21/2014, 07/31/2015, 06/10/2016, 06/15/2017  . Pneumococcal Conjugate-13 12/05/2014  . Pneumococcal Polysaccharide-23 03/20/2011  . Td 06/06/2001  . Tdap 03/20/2011   Screening Tests Health Maintenance  Topic Date Due  . Hepatitis C Screening  10/02/2020 (Originally [birth year])  . MAMMOGRAM  01/23/2018  . INFLUENZA VACCINE  03/31/2018  . TETANUS/TDAP  03/19/2021  .  COLONOSCOPY  07/28/2025  . DEXA SCAN  Completed  . PNA vac Low Risk Adult  Completed       Plan:     I have personally reviewed, addressed, and noted the following in the patient's chart:  A. Medical and social history B. Use of alcohol, tobacco or illicit drugs  C. Current medications and supplements D. Functional ability and status E.  Nutritional status F.  Physical activity G. Advance  directives H. List of other physicians I.  Hospitalizations, surgeries, and ER visits in previous 12 months J.  [place] to include hearing, vision, cognitive, depression L. Referrals and appointments - none  In addition, I have reviewed and discussed with patient certain preventive protocols, quality metrics, and best practice recommendations. A written personalized care plan for preventive services as well as general preventive health recommendations were provided to patient.  See attached scanned questionnaire for additional information.   Signed,   Lindell Noe, MHA, BS, LPN Health Coach

## 2017-12-24 NOTE — Patient Instructions (Addendum)
Ms. Grimsley , Thank you for taking time to come for your Medicare Wellness Visit. I appreciate your ongoing commitment to your health goals. Please review the following plan we discussed and let me know if I can assist you in the future.   These are the goals we discussed: Goals    . Increase physical activity     When tolerated, I will continue to walk for 20 minutes 3-4 days per week and to resume Silver Sneakers.        This is a list of the screening recommended for you and due dates:  Health Maintenance  Topic Date Due  .  Hepatitis C: One time screening is recommended by Center for Disease Control  (CDC) for  adults born from [birth year] through 1966.   10/02/2020*  . Mammogram  01/23/2018  . Flu Shot  03/31/2018  . Tetanus Vaccine  03/19/2021  . Colon Cancer Screening  07/28/2025  . DEXA scan (bone density measurement)  Completed  . Pneumonia vaccines  Completed  *Topic was postponed. The date shown is not the original due date.   Preventive Care for Adults  A healthy lifestyle and preventive care can promote health and wellness. Preventive health guidelines for adults include the following key practices.  . A routine yearly physical is a good way to check with your health care provider about your health and preventive screening. It is a chance to share any concerns and updates on your health and to receive a thorough exam.  . Visit your dentist for a routine exam and preventive care every 6 months. Brush your teeth twice a day and floss once a day. Good oral hygiene prevents tooth decay and gum disease.  . The frequency of eye exams is based on your age, health, family medical history, use  of contact lenses, and other factors. Follow your health care provider's recommendations for frequency of eye exams.  . Eat a healthy diet. Foods like vegetables, fruits, whole grains, low-fat dairy products, and lean protein foods contain the nutrients you need without too many calories.  Decrease your intake of foods high in solid fats, added sugars, and salt. Eat the right amount of calories for you. Get information about a proper diet from your health care provider, if necessary.  . Regular physical exercise is one of the most important things you can do for your health. Most adults should get at least 150 minutes of moderate-intensity exercise (any activity that increases your heart rate and causes you to sweat) each week. In addition, most adults need muscle-strengthening exercises on 2 or more days a week.  Silver Sneakers may be a benefit available to you. To determine eligibility, you may visit the website: www.silversneakers.com or contact program at 305-474-2073 Mon-Fri between 8AM-8PM.   . Maintain a healthy weight. The body mass index (BMI) is a screening tool to identify possible weight problems. It provides an estimate of body fat based on height and weight. Your health care provider can find your BMI and can help you achieve or maintain a healthy weight.   For adults 20 years and older: ? A BMI below 18.5 is considered underweight. ? A BMI of 18.5 to 24.9 is normal. ? A BMI of 25 to 29.9 is considered overweight. ? A BMI of 30 and above is considered obese.   . Maintain normal blood lipids and cholesterol levels by exercising and minimizing your intake of saturated fat. Eat a balanced diet with plenty of fruit and  vegetables. Blood tests for lipids and cholesterol should begin at age 11 and be repeated every 5 years. If your lipid or cholesterol levels are high, you are over 50, or you are at high risk for heart disease, you may need your cholesterol levels checked more frequently. Ongoing high lipid and cholesterol levels should be treated with medicines if diet and exercise are not working.  . If you smoke, find out from your health care provider how to quit. If you do not use tobacco, please do not start.  . If you choose to drink alcohol, please do not consume  more than 2 drinks per day. One drink is considered to be 12 ounces (355 mL) of beer, 5 ounces (148 mL) of wine, or 1.5 ounces (44 mL) of liquor.  . If you are 47-41 years old, ask your health care provider if you should take aspirin to prevent strokes.  . Use sunscreen. Apply sunscreen liberally and repeatedly throughout the day. You should seek shade when your shadow is shorter than you. Protect yourself by wearing long sleeves, pants, a wide-brimmed hat, and sunglasses year round, whenever you are outdoors.  . Once a month, do a whole body skin exam, using a mirror to look at the skin on your back. Tell your health care provider of new moles, moles that have irregular borders, moles that are larger than a pencil eraser, or moles that have changed in shape or color.

## 2017-12-29 ENCOUNTER — Encounter: Payer: Self-pay | Admitting: Family Medicine

## 2018-01-07 ENCOUNTER — Ambulatory Visit (INDEPENDENT_AMBULATORY_CARE_PROVIDER_SITE_OTHER): Payer: Medicare HMO | Admitting: Family Medicine

## 2018-01-07 ENCOUNTER — Encounter: Payer: Self-pay | Admitting: Family Medicine

## 2018-01-07 VITALS — BP 116/70 | HR 74 | Temp 98.5°F | Ht 60.5 in | Wt 157.5 lb

## 2018-01-07 DIAGNOSIS — Z0000 Encounter for general adult medical examination without abnormal findings: Secondary | ICD-10-CM

## 2018-01-07 DIAGNOSIS — D869 Sarcoidosis, unspecified: Secondary | ICD-10-CM

## 2018-01-07 DIAGNOSIS — M8580 Other specified disorders of bone density and structure, unspecified site: Secondary | ICD-10-CM

## 2018-01-07 DIAGNOSIS — E7800 Pure hypercholesterolemia, unspecified: Secondary | ICD-10-CM | POA: Diagnosis not present

## 2018-01-07 DIAGNOSIS — E2839 Other primary ovarian failure: Secondary | ICD-10-CM | POA: Diagnosis not present

## 2018-01-07 DIAGNOSIS — R05 Cough: Secondary | ICD-10-CM | POA: Diagnosis not present

## 2018-01-07 DIAGNOSIS — Z1231 Encounter for screening mammogram for malignant neoplasm of breast: Secondary | ICD-10-CM

## 2018-01-07 DIAGNOSIS — R059 Cough, unspecified: Secondary | ICD-10-CM

## 2018-01-07 MED ORDER — METHOCARBAMOL 500 MG PO TABS: ORAL_TABLET | ORAL | 5 refills | Status: DC

## 2018-01-07 MED ORDER — OMEPRAZOLE 20 MG PO CPDR: 20.0000 mg | DELAYED_RELEASE_CAPSULE | Freq: Every day | ORAL | 11 refills | Status: DC

## 2018-01-07 MED ORDER — DICLOFENAC SODIUM 1 % TD GEL: 2.0000 g | Freq: Three times a day (TID) | TRANSDERMAL | 11 refills | Status: DC | PRN

## 2018-01-07 NOTE — Progress Notes (Signed)
Subjective:    Patient ID: Barbara Cower, female    DOB: [birth year], 72 y.o.   MRN: [id number]  HPI Here for health maintenance exam and to review chronic medical problems    All of her joints bother her  Some nodules on her R index fingers  Wrists and elbows and knees again (especially aching at night)  Has not seen a rheumatologist in a while - for sarcoid  Also feels very tired    Sometimes muscles ache Exercises less due to pain-and then she gets tired   Seeing pulmonologist - was coughing/got better and now just a little bit  Also cardiology -did not think sarcoid was affecting heart  Her derm symptoms - hair is improved/ skin is about the same    Wt Readings from Last 3 Encounters:  01/07/18 157 lb 8 oz (71.4 kg)  12/24/17 158 lb (71.7 kg)  10/25/17 163 lb (73.9 kg)  weight is down- she is working on better diet  30.25 kg/m   Had amw on 4/26 1 fall in tub this year   Mammogram 5/17- has not had it yet  Has a personal hx of breast cancer  Self breast exam -no breast lumps   Colonoscopy 11/16 with [facility] for tortuous colon  Adenoma   dexa 5/17 osteopenia with dec in BMD mild  No fractures Will set that up at [facility] her vitamin D  Exercise as body allows and lifts weights   Zoster status -has had shingles twice   Hyperlipidemia Lab Results  Component Value Date   CHOL 185 12/24/2017   CHOL 200 12/23/2016   CHOL 141 12/12/2015   Lab Results  Component Value Date   HDL 53.20 12/24/2017   HDL 52.70 12/23/2016   HDL 52.30 12/12/2015   Lab Results  Component Value Date   LDLCALC 109 (H) 12/24/2017   LDLCALC 119 (H) 12/23/2016   LDLCALC 77 12/12/2015   Lab Results  Component Value Date   TRIG 111.0 12/24/2017   TRIG 144.0 12/23/2016   TRIG 60.0 12/12/2015   Lab Results  Component Value Date   CHOLHDL 3 12/24/2017   CHOLHDL 4 12/23/2016   CHOLHDL 3 12/12/2015   Lab Results  Component Value Date   LDLDIRECT 154.0 03/13/2011   LDLDIRECT 134.4 12/18/2008   LDLDIRECT 148.2 11/17/2007   improved with a better diet   Other labs Results for orders placed or performed in visit on 12/24/17  TSH  Result Value Ref Range   TSH 2.47 0.35 - 4.50 uIU/mL  CBC with Differential/Platelet  Result Value Ref Range   WBC 6.6 4.0 - 10.5 K/uL   RBC 4.93 3.87 - 5.11 Mil/uL   Hemoglobin 15.2 (H) 12.0 - 15.0 g/dL   HCT 45.2 36.0 - 46.0 %   MCV 91.6 78.0 - 100.0 fl   MCHC 33.6 30.0 - 36.0 g/dL   RDW 14.4 11.5 - 15.5 %   Platelets 319.0 150.0 - 400.0 K/uL   Neutrophils Relative % 59.8 43.0 - 77.0 %   Lymphocytes Relative 27.0 12.0 - 46.0 %   Monocytes Relative 8.2 3.0 - 12.0 %   Eosinophils Relative 4.3 0.0 - 5.0 %   Basophils Relative 0.7 0.0 - 3.0 %   Neutro Abs 3.9 1.4 - 7.7 K/uL   Lymphs Abs 1.8 0.7 - 4.0 K/uL   Monocytes Absolute 0.5 0.1 - 1.0 K/uL   Eosinophils Absolute 0.3 0.0 - 0.7 K/uL   Basophils Absolute 0.0 0.0 -  0.1 K/uL  Comprehensive metabolic panel  Result Value Ref Range   Sodium 141 135 - 145 mEq/L   Potassium 4.3 3.5 - 5.1 mEq/L   Chloride 105 96 - 112 mEq/L   CO2 26 19 - 32 mEq/L   Glucose, Bld 94 70 - 99 mg/dL   BUN 20 6 - 23 mg/dL   Creatinine, Ser 0.81 0.40 - 1.20 mg/dL   Total Bilirubin 0.4 0.2 - 1.2 mg/dL   Alkaline Phosphatase 72 39 - 117 U/L   AST 18 0 - 37 U/L   ALT 24 0 - 35 U/L   Total Protein 7.1 6.0 - 8.3 g/dL   Albumin 4.2 3.5 - 5.2 g/dL   Calcium 9.3 8.4 - 10.5 mg/dL   GFR 73.70 >60.00 mL/min  Lipid panel  Result Value Ref Range   Cholesterol 185 0 - 200 mg/dL   Triglycerides 111.0 0.0 - 149.0 mg/dL   HDL 53.20 >39.00 mg/dL   VLDL 22.2 0.0 - 40.0 mg/dL   LDL Cholesterol 109 (H) 0 - 99 mg/dL   Total CHOL/HDL Ratio 3    NonHDL 131.41      Review of Systems  Constitutional: Negative for activity change, appetite change, fatigue, fever and unexpected weight change.  HENT: Negative for congestion, ear pain, rhinorrhea, sinus pressure and sore throat.   Eyes: Negative for pain,  redness and visual disturbance.  Respiratory: Positive for cough. Negative for shortness of breath and wheezing.   Cardiovascular: Negative for chest pain and palpitations.  Gastrointestinal: Negative for abdominal pain, blood in stool, constipation and diarrhea.  Endocrine: Negative for polydipsia and polyuria.  Genitourinary: Negative for dysuria, frequency and urgency.  Musculoskeletal: Positive for arthralgias. Negative for back pain and myalgias.  Skin: Negative for pallor and rash.  Allergic/Immunologic: Negative for environmental allergies.  Neurological: Negative for dizziness, syncope and headaches.  Hematological: Negative for adenopathy. Does not bruise/bleed easily.  Psychiatric/Behavioral: Negative for decreased concentration and dysphoric mood. The patient is not nervous/anxious.        Objective:   Physical Exam  Constitutional: She appears well-developed and well-nourished. No distress.  overwt and well app  HENT:  Head: Normocephalic and atraumatic.  Right Ear: External ear normal.  Left Ear: External ear normal.  Mouth/Throat: Oropharynx is clear and moist.  Eyes: Pupils are equal, round, and reactive to light. Conjunctivae and EOM are normal. No scleral icterus.  Neck: Normal range of motion. Neck supple. No JVD present. Carotid bruit is not present. No thyromegaly present.  Cardiovascular: Normal rate, regular rhythm, normal heart sounds and intact distal pulses. Exam reveals no gallop.  Pulmonary/Chest: Effort normal and breath sounds normal. No respiratory distress. She has no wheezes. She exhibits no tenderness. No breast tenderness, discharge or bleeding.  Abdominal: Soft. Bowel sounds are normal. She exhibits no distension, no abdominal bruit and no mass. There is no tenderness.  Genitourinary: No breast tenderness, discharge or bleeding.  Genitourinary Comments: Breast exam: No mass, nodules, thickening, tenderness, bulging, retraction, inflamation, nipple  discharge or skin changes noted.  No axillary or clavicular LA.      Musculoskeletal: Normal range of motion. She exhibits no edema or tenderness.  Lymphadenopathy:    She has no cervical adenopathy.  Neurological: She is alert. She has normal reflexes. No cranial nerve deficit. She exhibits normal muscle tone. Coordination normal.  Skin: Skin is warm and dry. No rash noted. No erythema. No pallor.  No changes//sarcoid  Psychiatric: She has a normal mood and affect.  Assessment & Plan:   Problem List Items Addressed This Visit      Musculoskeletal and Integument   Osteopenia    dexa ordered at [facility] No fractures On vit D         Other   Cough    Ongoing f/u pulm for sarcoid       Estrogen deficiency   Relevant Orders   DG Bone Density   Hyperlipidemia    Disc goals for lipids and reasons to control them Rev labs with pt Rev low sat fat diet in detail Improved with a better diet       Routine general medical examination at a health care facility - Primary    Reviewed health habits including diet and exercise and skin cancer prevention Reviewed appropriate screening tests for age  Also reviewed health mt list, fam hx and immunization status , as well as social and family history   See HPI amw rev Lab rev dexa and mammogram ordered Ref to rheum for sarcoid with joint pain Disc shingrix vaccine       Sarcoid    Skin and lung symptoms  Now having joint c/o  Not treating currently  Ref to rheum      Relevant Orders   Ambulatory referral to Rheumatology   Screening mammogram, encounter for    Scheduled annual screening mammogram Nl breast exam today  Encouraged monthly self exams        Relevant Orders   MM DIGITAL SCREENING BILATERAL

## 2018-01-07 NOTE — Patient Instructions (Addendum)
If you are interested in the new shingles vaccine (Shingrix) - call your local pharmacy to check on coverage and availability  Have them put you on a wait list if it is affordable     We will refer you for bone density and mammogram   Also rheumatology for sarcoid

## 2018-01-09 NOTE — Assessment & Plan Note (Signed)
Reviewed health habits including diet and exercise and skin cancer prevention Reviewed appropriate screening tests for age  Also reviewed health mt list, fam hx and immunization status , as well as social and family history   See HPI amw rev Lab rev dexa and mammogram ordered Ref to rheum for sarcoid with joint pain Disc shingrix vaccine

## 2018-01-09 NOTE — Assessment & Plan Note (Signed)
dexa ordered at [facility] No fractures On vit D

## 2018-01-09 NOTE — Assessment & Plan Note (Signed)
Ongoing f/u pulm for sarcoid

## 2018-01-09 NOTE — Assessment & Plan Note (Signed)
Scheduled annual screening mammogram Nl breast exam today  Encouraged monthly self exams   

## 2018-01-09 NOTE — Assessment & Plan Note (Signed)
Disc goals for lipids and reasons to control them Rev labs with pt Rev low sat fat diet in detail Improved with a better diet

## 2018-01-09 NOTE — Assessment & Plan Note (Signed)
Skin and lung symptoms  Now having joint c/o  Not treating currently  Ref to rheum

## 2018-01-10 ENCOUNTER — Telehealth: Payer: Self-pay | Admitting: *Deleted

## 2018-01-10 NOTE — Telephone Encounter (Signed)
Diclofenac 1% PA done at [email or web address], I will await a response

## 2018-01-11 NOTE — Telephone Encounter (Signed)
Pt was approved. Letter faxed to pharmacy and then placed in Dr. Marliss Coots inbox to sign and send for scanning

## 2018-01-21 DIAGNOSIS — R69 Illness, unspecified: Secondary | ICD-10-CM | POA: Diagnosis not present

## 2018-01-26 ENCOUNTER — Ambulatory Visit (INDEPENDENT_AMBULATORY_CARE_PROVIDER_SITE_OTHER)
Admission: RE | Admit: 2018-01-26 | Discharge: 2018-01-26 | Disposition: A | Discharge status: Home / Self Care | Payer: Medicare HMO | Source: Ambulatory Visit | Attending: Family Medicine | Admitting: Family Medicine

## 2018-01-26 DIAGNOSIS — E2839 Other primary ovarian failure: Secondary | ICD-10-CM

## 2018-02-11 ENCOUNTER — Ambulatory Visit
Admission: RE | Admit: 2018-02-11 | Discharge: 2018-02-11 | Disposition: A | Discharge status: Home / Self Care | Payer: Medicare HMO | Source: Ambulatory Visit | Attending: Family Medicine | Admitting: Family Medicine

## 2018-02-11 DIAGNOSIS — Z1231 Encounter for screening mammogram for malignant neoplasm of breast: Secondary | ICD-10-CM

## 2018-02-14 ENCOUNTER — Other Ambulatory Visit: Payer: Self-pay | Admitting: Family Medicine

## 2018-02-14 DIAGNOSIS — R928 Other abnormal and inconclusive findings on diagnostic imaging of breast: Secondary | ICD-10-CM

## 2018-02-14 DIAGNOSIS — N6489 Other specified disorders of breast: Secondary | ICD-10-CM

## 2018-02-18 ENCOUNTER — Ambulatory Visit
Admission: RE | Admit: 2018-02-18 | Discharge: 2018-02-18 | Disposition: A | Discharge status: Home / Self Care | Payer: Medicare HMO | Source: Ambulatory Visit | Attending: Family Medicine | Admitting: Family Medicine

## 2018-02-18 DIAGNOSIS — Z853 Personal history of malignant neoplasm of breast: Secondary | ICD-10-CM | POA: Diagnosis not present

## 2018-02-18 DIAGNOSIS — R928 Other abnormal and inconclusive findings on diagnostic imaging of breast: Secondary | ICD-10-CM | POA: Insufficient documentation

## 2018-02-18 DIAGNOSIS — N6489 Other specified disorders of breast: Secondary | ICD-10-CM | POA: Diagnosis not present

## 2018-02-25 DIAGNOSIS — M546 Pain in thoracic spine: Secondary | ICD-10-CM | POA: Diagnosis not present

## 2018-02-25 DIAGNOSIS — M4316 Spondylolisthesis, lumbar region: Secondary | ICD-10-CM | POA: Diagnosis not present

## 2018-03-16 ENCOUNTER — Ambulatory Visit
Admission: RE | Admit: 2018-03-16 | Discharge: 2018-03-16 | Disposition: A | Discharge status: Home / Self Care | Payer: Medicare HMO | Source: Ambulatory Visit | Attending: Internal Medicine | Admitting: Internal Medicine

## 2018-03-16 ENCOUNTER — Encounter: Payer: Self-pay | Admitting: Internal Medicine

## 2018-03-16 ENCOUNTER — Ambulatory Visit: Payer: Medicare HMO | Admitting: Internal Medicine

## 2018-03-16 VITALS — BP 138/88 | HR 87 | Resp 16 | Ht 60.5 in | Wt 157.0 lb

## 2018-03-16 DIAGNOSIS — D869 Sarcoidosis, unspecified: Secondary | ICD-10-CM

## 2018-03-16 DIAGNOSIS — J4520 Mild intermittent asthma, uncomplicated: Secondary | ICD-10-CM

## 2018-03-16 DIAGNOSIS — J984 Other disorders of lung: Secondary | ICD-10-CM | POA: Insufficient documentation

## 2018-03-16 MED ORDER — ALBUTEROL SULFATE HFA 108 (90 BASE) MCG/ACT IN AERS
2.0000 | INHALATION_SPRAY | RESPIRATORY_TRACT | 2 refills | Status: DC | PRN
Start: 2018-03-16 — End: 2022-02-23

## 2018-03-16 NOTE — Progress Notes (Signed)
Name: Judith [name] MRN: [id number] DOB: 07-14-1946     CONSULTATION DATE:03/16/2018  REFERRING MD :  Dr. Roxan Hockey  CHIEF COMPLAINT:   Follow-up sarcoid  STUDIES:  CT chest Jan 26, 2017 I have Independently reviewed images of  CT chest   on 03/16/2018 Interpretation:B/l pulm nodules  Previous history 72 year old pleasant white female seen today for abnormal CT chest with bilateral pulmonary nodules Patient has a diagnosis and history of sarcoidosis of the scalp In February 2018 patient developed a chronic cough patient was worked up for chest x-ray and a CT scan of the chest which revealed several pulmonary nodules Patient was then referred to cardiothoracic surgery for further evaluation Dr. 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 Patient sees ophthalmology on an annual basis to assess for sarcoid  At this time patient refuses Plaquenil therapy Patient also refuses immunotherapy  Patient has no signs of infection at this time Patient has no signs of acute heart failure at this time  Patient's cough is persistent and mild but is bothersome for the patient Patient does not have progressive shortness of breath or dyspnea exertion  HISTORY OF PRESENT ILLNESS:   Stopped prednisone  Has some SOB with exertion Maybe related to back spasms Has stated she has slipped disk Less exercise since back issues +back pain and muscle  spasms  +SOB with exertion    No signs of infection at this time No signs of distress at this time  her cough has improved significantly  Allergies  Allergen Reactions  . Niacin And Related Other (See Comments)    Caused black and blue marks all over body  . Meloxicam Rash    REVIEW OF SYSTEMS:   Constitutional: Negative for fever, chills, weight loss, +malaise/fatigue  HENT: Negative for hearing loss, ear pain, nosebleeds, congestion, sore throat, neck pain, tinnitus and ear discharge.   Eyes: Negative for blurred vision, double vision, photophobia, pain, discharge and redness.  Respiratory: -cough, -hemoptysis, -sputum production, --shortness of breath, wheezing and stridor.   Cardiovascular: Negative for chest pain, palpitations, orthopnea, claudication, leg swelling and PND.  ALL OTHER ROS ARE NEGATIVE   BP 138/88 (BP Location: Left Arm, Cuff Size: Normal)   Pulse 87   Resp 16   Ht 5' 0.5" (1.537 m)   Wt 157 lb (71.2 kg)   SpO2 93%   BMI 30.16 kg/m    Physical Examination:   GENERAL:NAD, no fevers, chills, no weakness no fatigue HEAD: Normocephalic, atraumatic.  EYES: Pupils equal, round, reactive to light. Extraocular muscles intact. No scleral icterus.  MOUTH: Moist mucosal membrane.   EAR, NOSE, THROAT: Clear without exudates. No external lesions.  NECK: Supple. No thyromegaly. No nodules. No JVD.  PULMONARY:CTA B/L no wheezes, no crackles, no rhonchi CARDIOVASCULAR: S1 and S2. Regular rate and rhythm. No murmurs, rubs, or gallops. No edema.  SKIN: + ulceration on scalp, +lesions on scalp   PSYCHIATRIC: Mood, affect within normal limits. The patient is awake, alert and oriented x 3.  Insight, judgment intact.     ASSESSMENT / PLAN:  72 year old pleasant white female seen today for chronic cough,  patient with a history of cutaneous sarcoidosis and with h/o  bilateral pulmonary nodules s/p  navigational bronchoscopy and findings consistent with pulmonary  sarcoidosis,    At this time patient has been weaned off prednisone, but has some SOB with exertion I will need to obtain CXR to assess interval changes Will add albuterol inhaler to see if this helps her SOB/cough   Patient does not want to restart Plaquenil nor does she want to start immunotherapy, patient does not want cough suppressant at this time    #1 follow-up dermatology #2 follow-up ophthalmology  No need for steroids at this time.   Patient  satisfied with Plan of action and management. All questions answered  follow-up in 3 months  Hever Castilleja Patricia Pesa, M.D.  [facility]  Medical Director [place] Director [facility] Cardio-Pulmonary Department

## 2018-03-16 NOTE — Patient Instructions (Signed)
Start albuterol as needed Follow up Eye appointment Check CXR 2 view  Follow up Rheumotologist

## 2018-03-17 DIAGNOSIS — H0016 Chalazion left eye, unspecified eyelid: Secondary | ICD-10-CM | POA: Diagnosis not present

## 2018-03-18 ENCOUNTER — Encounter: Payer: Self-pay | Admitting: Internal Medicine

## 2018-03-21 DIAGNOSIS — Z6829 Body mass index (BMI) 29.0-29.9, adult: Secondary | ICD-10-CM | POA: Diagnosis not present

## 2018-03-21 DIAGNOSIS — E663 Overweight: Secondary | ICD-10-CM | POA: Diagnosis not present

## 2018-03-21 DIAGNOSIS — M2550 Pain in unspecified joint: Secondary | ICD-10-CM | POA: Diagnosis not present

## 2018-03-21 DIAGNOSIS — M5136 Other intervertebral disc degeneration, lumbar region: Secondary | ICD-10-CM | POA: Diagnosis not present

## 2018-03-21 DIAGNOSIS — D860 Sarcoidosis of lung: Secondary | ICD-10-CM | POA: Diagnosis not present

## 2018-03-22 DIAGNOSIS — M7711 Lateral epicondylitis, right elbow: Secondary | ICD-10-CM | POA: Diagnosis not present

## 2018-03-22 DIAGNOSIS — M4316 Spondylolisthesis, lumbar region: Secondary | ICD-10-CM | POA: Diagnosis not present

## 2018-03-22 DIAGNOSIS — M549 Dorsalgia, unspecified: Secondary | ICD-10-CM | POA: Diagnosis not present

## 2018-04-21 DIAGNOSIS — M7712 Lateral epicondylitis, left elbow: Secondary | ICD-10-CM | POA: Diagnosis not present

## 2018-04-21 DIAGNOSIS — M7711 Lateral epicondylitis, right elbow: Secondary | ICD-10-CM | POA: Diagnosis not present

## 2018-05-19 DIAGNOSIS — H2513 Age-related nuclear cataract, bilateral: Secondary | ICD-10-CM | POA: Diagnosis not present

## 2018-05-24 DIAGNOSIS — M5136 Other intervertebral disc degeneration, lumbar region: Secondary | ICD-10-CM | POA: Diagnosis not present

## 2018-05-24 DIAGNOSIS — D860 Sarcoidosis of lung: Secondary | ICD-10-CM | POA: Diagnosis not present

## 2018-05-24 DIAGNOSIS — Z6829 Body mass index (BMI) 29.0-29.9, adult: Secondary | ICD-10-CM | POA: Diagnosis not present

## 2018-05-24 DIAGNOSIS — E663 Overweight: Secondary | ICD-10-CM | POA: Diagnosis not present

## 2018-05-24 DIAGNOSIS — M2550 Pain in unspecified joint: Secondary | ICD-10-CM | POA: Diagnosis not present

## 2018-05-25 ENCOUNTER — Other Ambulatory Visit: Payer: Self-pay | Admitting: Family Medicine

## 2018-05-25 ENCOUNTER — Ambulatory Visit (INDEPENDENT_AMBULATORY_CARE_PROVIDER_SITE_OTHER)
Admission: RE | Admit: 2018-05-25 | Discharge: 2018-05-25 | Disposition: A | Discharge status: Home / Self Care | Payer: Medicare HMO | Source: Ambulatory Visit | Attending: Family Medicine | Admitting: Family Medicine

## 2018-05-25 ENCOUNTER — Ambulatory Visit (INDEPENDENT_AMBULATORY_CARE_PROVIDER_SITE_OTHER): Payer: Medicare HMO | Admitting: Family Medicine

## 2018-05-25 ENCOUNTER — Encounter: Payer: Self-pay | Admitting: Family Medicine

## 2018-05-25 VITALS — BP 114/72 | HR 76 | Temp 98.9°F | Ht 60.5 in | Wt 155.5 lb

## 2018-05-25 DIAGNOSIS — M25552 Pain in left hip: Secondary | ICD-10-CM | POA: Diagnosis not present

## 2018-05-25 DIAGNOSIS — M25551 Pain in right hip: Secondary | ICD-10-CM

## 2018-05-25 DIAGNOSIS — M7061 Trochanteric bursitis, right hip: Secondary | ICD-10-CM | POA: Diagnosis not present

## 2018-05-25 DIAGNOSIS — Z23 Encounter for immunization: Secondary | ICD-10-CM | POA: Diagnosis not present

## 2018-05-25 DIAGNOSIS — M7062 Trochanteric bursitis, left hip: Secondary | ICD-10-CM | POA: Diagnosis not present

## 2018-05-25 MED ORDER — METHYLPREDNISOLONE ACETATE 40 MG/ML IJ SUSP
80.0000 mg | Freq: Once | INTRAMUSCULAR | Status: AC
  Administered 2018-05-25: 80 mg via INTRA_ARTICULAR

## 2018-05-25 MED ORDER — METHYLPREDNISOLONE ACETATE 40 MG/ML IJ SUSP
80.0000 mg | Freq: Once | INTRAMUSCULAR | Status: AC
Start: 2018-05-25 — End: 2018-05-25
  Administered 2018-05-25: 80 mg via INTRA_ARTICULAR

## 2018-05-25 NOTE — Progress Notes (Signed)
Dr. Frederico Hamman T. Calina Patrie, MD, [facility] Primary [facility] and Sports Medicine [place], [address] Phone: [phone] Fax: [phone]  05/25/2018  Patient: Judith [name], MRN: [id number], DOB: 1946/03/05, 72 y.o.  Primary Physician:  Tower, Wynelle Fanny, MD   Chief Complaint  Patient presents with  . Hip Pain    bilateral   Subjective:   Judith [name] is a 72 y.o. very pleasant female patient who presents with the following:  It appears she has seen Irene Shipper, Dahari Hulan Saas,   B lateral hip pain: She is a well-known patient.  She does have some existing back problems, and she is seeing both Dr. Rolena Infante as well as Dr. Nelva Bush.  I last saw her for some trochanteric bursitis in January 2017, and at that point I did do bilateral trochanteric bursa injections.  This was after a trip to [place].  She has been doing a great job doing her rehab, and her hips are very strong, but now she has a lot of pain in each lateral hip and has an upcoming trip.  Ache.  B GTB inj  Past Medical History, Surgical History, Social History, Family History, Problem List, Medications, and Allergies have been reviewed and updated if relevant.  Patient Active Problem List   Diagnosis Date Noted  . Screening mammogram, encounter for 01/07/2018  . Shortness of breath 10/25/2017  . Sinus tachycardia 10/25/2017  . Multiple lung nodules on CT 10/23/2017  . Spondylolisthesis, lumbar region 10/14/2017  . Pain in thoracic spine 10/14/2017  . Backache 10/14/2017  . Low back pain 10/06/2017  . Cough 11/16/2016  . Routine general medical examination at a health care facility 01/06/2016  . IBS (irritable colon syndrome) 05/31/2015  . Colon polyps 05/31/2015  . Trochanteric bursitis of both hips 05/31/2015  . Estrogen deficiency 12/05/2014  . Encounter for Medicare annual wellness exam 09/24/2013  . Personal history of other malignant neoplasm of skin 09/21/2013  .  Enlarged lymph node 03/07/2013  . Sarcoid 08/09/2012  . Mixed incontinence urge and stress 03/16/2012  . ALOPECIA 11/14/2010  . GERD 01/24/2009  . NEOPLASM, MALIGNANT, BREAST, HX OF 01/02/2009  . Hyperlipidemia 11/17/2007  . VAGINITIS, ATROPHIC, SYMPTOMATIC 11/17/2007  . Osteopenia 11/17/2007  . TINNITUS 09/07/2007  . ALLERGIC RHINITIS 09/07/2007  . DIVERTICULOSIS, COLON 09/07/2007  . Fatty liver 09/07/2007  . History of diverticulitis 08/12/2007    Past Medical History:  Diagnosis Date  . Allergic rhinitis   . Allergy   . Arthritis   . Atrophic vaginitis   . Breast cancer ([place])    Right mastectomy  . Diverticulosis   . GERD (gastroesophageal reflux disease)   . H/O: pneumonia    as child-viral pneumonia  . Headache   . History of shingles   . HLD (hyperlipidemia)    pt denies  . IBS (irritable bowel syndrome)   . Osteopenia   . Sarcoidosis   . Sliding hiatal hernia     Past Surgical History:  Procedure Laterality Date  . APPENDECTOMY    . BASAL CELL CARCINOMA EXCISION     scalp  . BASAL CELL CARCINOMA EXCISION  08/2016   nose  . COLONOSCOPY  02/2010   divertics, rpt 5 years  . DEXA  2005   osteopenia, no change from 2003  . DILATION AND CURETTAGE OF UTERUS  10/1999  . LAPAROSCOPIC TOTAL HYSTERECTOMY  10/03   for fibroids/polyp  . MASTECTOMY Right 02/2009   right  breast cancer  . POLYPECTOMY    . TOTAL ABDOMINAL HYSTERECTOMY  2003   for fibroids/polyps  . VIDEO BRONCHOSCOPY WITH ENDOBRONCHIAL NAVIGATION N/A 02/01/2017   Procedure: VIDEO BRONCHOSCOPY WITH ENDOBRONCHIAL NAVIGATION;  Surgeon: Melrose Nakayama, MD;  Location: [facility];  Service: Thoracic;  Laterality: N/A;    Social History   Socioeconomic History  . Marital status: Married    Spouse name: Not on file  . Number of children: 2  . Years of education: Not on file  . Highest education level: Not on file  Occupational History  . Occupation: Retired  Scientific laboratory technician  . Financial resource  strain: Not on file  . Food insecurity:    Worry: Not on file    Inability: Not on file  . Transportation needs:    Medical: Not on file    Non-medical: Not on file  Tobacco Use  . Smoking status: Former Smoker    Last attempt to quit: 08/31/1968    Years since quitting: 49.7  . Smokeless tobacco: Never Used  Substance and Sexual Activity  . Alcohol use: Yes    Alcohol/week: 0.0 standard drinks    Comment: 1-2 glasses of wine/weekly  . Drug use: No  . Sexual activity: Yes  Lifestyle  . Physical activity:    Days per week: Not on file    Minutes per session: Not on file  . Stress: Not on file  Relationships  . Social connections:    Talks on phone: Not on file    Gets together: Not on file    Attends religious service: Not on file    Active member of club or organization: Not on file    Attends meetings of clubs or organizations: Not on file    Relationship status: Not on file  . Intimate partner violence:    Fear of current or ex partner: Not on file    Emotionally abused: Not on file    Physically abused: Not on file    Forced sexual activity: Not on file  Other Topics Concern  . Not on file  Social History Narrative   Married      2 sons      Retired      2 cups coffee/daily      Gym 3-4 times/week    Family History  Problem Relation Age of Onset  . Prostate cancer Father   . Prostate cancer Brother   . Lymphoma Brother   . Heart attack Paternal Grandmother   . Stomach cancer Maternal Grandfather   . Colon cancer Neg Hx   . Rectal cancer Neg Hx   . Esophageal cancer Neg Hx     Allergies  Allergen Reactions  . Niacin And Related Other (See Comments)    Caused black and blue marks all over body  . Meloxicam Rash    Medication list reviewed and updated in full in [place].  GEN: No fevers, chills. Nontoxic. Primarily MSK c/o today. MSK: Detailed in the HPI GI: tolerating PO intake without difficulty Neuro: No numbness, parasthesias, or  tingling associated. Otherwise the pertinent positives of the ROS are noted above.   Objective:   BP 114/72   Pulse 76   Temp 98.9 F (37.2 C) (Oral)   Ht 5' 0.5" (1.537 m)   Wt 155 lb 8 oz (70.5 kg)   BMI 29.87 kg/m    GEN: WDWN, NAD, Non-toxic, Alert & Oriented x 3 HEENT: Atraumatic, Normocephalic.  Ears and  Nose: No external deformity. EXTR: No clubbing/cyanosis/edema NEURO: Normal gait.  PSYCH: Normally interactive. Conversant. Not depressed or anxious appearing.  Calm demeanor.   HIP EXAM: SIDE: b ROM: Abduction, Flexion, Internal and External range of motion: full Pain with terminal IROM and EROM: no GTB: TTP b SLR: NEG Knees: No effusion FABER: NT REVERSE FABER: NT, neg Piriformis: NT at direct palpation Str: flexion: 5/5 abduction: 5/5 adduction: 5/5 Strength testing non-tender  Radiology: No results found.  Assessment and Plan:   Trochanteric bursitis of both hips - Plan: methylPREDNISolone acetate (DEPO-MEDROL) injection 80 mg, methylPREDNISolone acetate (DEPO-MEDROL) injection 80 mg  Need for prophylactic vaccination and inoculation against influenza - Plan: Flu Vaccine QUAD 6+ mos PF IM (Fluarix Quad PF)  Classic GTB B. Cont with HEP, exercise as she is able.  I think reasonable to inject her hips for pain relief and quality of life.   Trochanteric Bursitis Injection, R Date of procedure: 05/25/2018 Verbal consent obtained. Risks (including infection, potential atrophy), benefits, and alternatives reviewed. Greater trochanter sterilely prepped with Chloraprep. Ethyl Chloride used for anesthesia. 8 cc of Lidocaine 1% injected with 2 mL of Depo-Medrol 40 mg into trochanteric bursa at area of maximal tenderness at greater trochanter. Needle taken to bone to troch bursa, flows easily. Bursa massaged. No bleeding and no complications. Decreased pain after injection. Needle: 22 gauge spinal needle Medication: 2 mL of Depo-Medrol 40 mg, equaling Depo-Medrol 80  mg total  Trochanteric Bursitis Injection, L Date of procedure: 05/25/2018 Verbal consent obtained. Risks (including infection, potential atrophy), benefits, and alternatives reviewed. Greater trochanter sterilely prepped with Chloraprep. Ethyl Chloride used for anesthesia. 8 cc of Lidocaine 1% injected with 2 mL of Depo-Medrol 40 mg into trochanteric bursa at area of maximal tenderness at greater trochanter. Needle taken to bone to troch bursa, flows easily. Bursa massaged. No bleeding and no complications. Decreased pain after injection. Needle: 22 gauge spinal needle Medication: 2 mL of Depo-Medrol 40 mg, equaling Depo-Medrol 80 mg total   Follow-up: prn  Meds ordered this encounter  Medications  . methylPREDNISolone acetate (DEPO-MEDROL) injection 80 mg  . methylPREDNISolone acetate (DEPO-MEDROL) injection 80 mg   Orders Placed This Encounter  Procedures  . Flu Vaccine QUAD 6+ mos PF IM (Fluarix Quad PF)    Signed,  Kartel Wolbert T. Acquanetta Cabanilla, MD   Allergies as of 05/25/2018      Reactions   Niacin And Related Other (See Comments)   Caused black and blue marks all over body   Meloxicam Rash      Medication List        Accurate as of 05/25/18 11:59 PM. Always use your most recent med list.          albuterol 108 (90 Base) MCG/ACT inhaler Commonly known as:  PROVENTIL HFA;VENTOLIN HFA Inhale 2 puffs into the lungs every 4 (four) hours as needed for wheezing or shortness of breath.   calcium-vitamin D 500-200 MG-UNIT tablet Commonly known as:  OSCAL WITH D Take 1 tablet by mouth once a week.   diclofenac sodium 1 % Gel Commonly known as:  VOLTAREN Apply 2 g topically 3 (three) times daily as needed.   FIBER SELECT GUMMIES PO Take 2 tablets by mouth at bedtime.   fish oil-omega-3 fatty acids 1000 MG capsule Take 1 g by mouth once a week.   ibuprofen 200 MG tablet Commonly known as:  ADVIL,MOTRIN Take 400 mg by mouth every 8 (eight) hours as needed.   methocarbamol  500 MG  tablet Commonly known as:  ROBAXIN TAKE 1 TABLET BY MOUTH EVERY 8 HOURS AS NEEDED FOR MUSCLE SPASMS.   multivitamin with minerals Tabs tablet Take 1 tablet by mouth at bedtime.   neomycin-polymyxin-dexameth 0.1 % Oint Commonly known as:  MAXITROL Place 1 application into both eyes See admin instructions. PRN: TWICE DAILY X1 WEEK & THEN DECREASE TO ONCE DAILY FOR X2 WEEKS   omeprazole 20 MG capsule Commonly known as:  PRILOSEC Take 1 capsule (20 mg total) by mouth daily before breakfast. TAKE ONE CAPSULE BY MOUTH DAILY AS NEEDED

## 2018-05-29 ENCOUNTER — Encounter: Payer: Self-pay | Admitting: Family Medicine

## 2018-06-08 ENCOUNTER — Encounter: Payer: Self-pay | Admitting: Family Medicine

## 2018-06-08 DIAGNOSIS — Z872 Personal history of diseases of the skin and subcutaneous tissue: Secondary | ICD-10-CM | POA: Diagnosis not present

## 2018-06-08 DIAGNOSIS — D2272 Melanocytic nevi of left lower limb, including hip: Secondary | ICD-10-CM | POA: Diagnosis not present

## 2018-06-08 DIAGNOSIS — D485 Neoplasm of uncertain behavior of skin: Secondary | ICD-10-CM | POA: Diagnosis not present

## 2018-06-08 DIAGNOSIS — Z08 Encounter for follow-up examination after completed treatment for malignant neoplasm: Secondary | ICD-10-CM | POA: Diagnosis not present

## 2018-06-08 DIAGNOSIS — L820 Inflamed seborrheic keratosis: Secondary | ICD-10-CM | POA: Diagnosis not present

## 2018-06-08 DIAGNOSIS — D2271 Melanocytic nevi of right lower limb, including hip: Secondary | ICD-10-CM | POA: Diagnosis not present

## 2018-06-08 DIAGNOSIS — D225 Melanocytic nevi of trunk: Secondary | ICD-10-CM | POA: Diagnosis not present

## 2018-06-08 DIAGNOSIS — D2262 Melanocytic nevi of left upper limb, including shoulder: Secondary | ICD-10-CM | POA: Diagnosis not present

## 2018-06-08 DIAGNOSIS — Z85828 Personal history of other malignant neoplasm of skin: Secondary | ICD-10-CM | POA: Diagnosis not present

## 2018-06-08 DIAGNOSIS — Z09 Encounter for follow-up examination after completed treatment for conditions other than malignant neoplasm: Secondary | ICD-10-CM | POA: Diagnosis not present

## 2018-06-08 DIAGNOSIS — D2261 Melanocytic nevi of right upper limb, including shoulder: Secondary | ICD-10-CM | POA: Diagnosis not present

## 2018-06-09 ENCOUNTER — Ambulatory Visit (INDEPENDENT_AMBULATORY_CARE_PROVIDER_SITE_OTHER): Payer: Medicare HMO | Admitting: Family Medicine

## 2018-06-09 VITALS — BP 114/80 | HR 70 | Temp 98.7°F | Ht 60.5 in | Wt 153.2 lb

## 2018-06-09 DIAGNOSIS — J0100 Acute maxillary sinusitis, unspecified: Secondary | ICD-10-CM | POA: Diagnosis not present

## 2018-06-09 MED ORDER — FLUTICASONE PROPIONATE 50 MCG/ACT NA SUSP: 2.0000 | Freq: Every day | NASAL | 3 refills | Status: DC

## 2018-06-09 MED ORDER — AMOXICILLIN-POT CLAVULANATE 875-125 MG PO TABS: 1.0000 | ORAL_TABLET | Freq: Two times a day (BID) | ORAL | 0 refills | Status: AC

## 2018-06-09 MED ORDER — CIPROFLOXACIN HCL 500 MG PO TABS: 500.0000 mg | ORAL_TABLET | Freq: Two times a day (BID) | ORAL | 0 refills | Status: DC

## 2018-06-09 NOTE — Progress Notes (Signed)
Dr. Frederico Hamman T. Kamarah Bilotta, MD, [facility] Primary Care and Sports Medicine [place], [address] Phone: [phone] Fax: [phone]  06/09/2018  Patient: Judith [name], MRN: [id number], DOB: [birth year], 72 y.o.  Primary Physician:  Tower, Wynelle Fanny, MD   Chief Complaint  Patient presents with  . Sinus Headaches    since September   Subjective:   This 72 y.o. female patient presents with HA x 1 month with some prior allergy sx. Now the primary complaint has become sinus pressure and pain behind the eyes and in the upper, anterior face.   HA x 1 month. Ongoing soince September. Saw eye doctor recently and doing OK.  Will take some calritin and excedrin. Taking some tylenol sinus. ES. Goes away for a while.   The patent denies sore throat as the primary complaint. Denies sthortness of breath/wheezing, high fever, chest pain, significant myalgia, otalgia, abdominal pain, changes in bowel or bladder.  PMH, PHS, Allergies, Problem List, Medications, Family History, and Social History have all been reviewed.  Patient Active Problem List   Diagnosis Date Noted  . Screening mammogram, encounter for 01/07/2018  . Shortness of breath 10/25/2017  . Sinus tachycardia 10/25/2017  . Multiple lung nodules on CT 10/23/2017  . Spondylolisthesis, lumbar region 10/14/2017  . Pain in thoracic spine 10/14/2017  . Backache 10/14/2017  . Low back pain 10/06/2017  . Cough 11/16/2016  . Routine general medical examination at a health care facility 01/06/2016  . IBS (irritable colon syndrome) 05/31/2015  . Colon polyps 05/31/2015  . Trochanteric bursitis of both hips 05/31/2015  . Estrogen deficiency 12/05/2014  . Encounter for Medicare annual wellness exam 09/24/2013  . Personal history of other malignant neoplasm of skin 09/21/2013  . Enlarged lymph node 03/07/2013  . Sarcoid 08/09/2012  . Mixed incontinence urge and stress 03/16/2012  . ALOPECIA 11/14/2010  .  GERD 01/24/2009  . NEOPLASM, MALIGNANT, BREAST, HX OF 01/02/2009  . Hyperlipidemia 11/17/2007  . VAGINITIS, ATROPHIC, SYMPTOMATIC 11/17/2007  . Osteopenia 11/17/2007  . TINNITUS 09/07/2007  . ALLERGIC RHINITIS 09/07/2007  . DIVERTICULOSIS, COLON 09/07/2007  . Fatty liver 09/07/2007  . History of diverticulitis 08/12/2007    Past Medical History:  Diagnosis Date  . Allergic rhinitis   . Allergy   . Arthritis   . Atrophic vaginitis   . Breast cancer ([place])    Right mastectomy  . Diverticulosis   . GERD (gastroesophageal reflux disease)   . H/O: pneumonia    as child-viral pneumonia  . Headache   . History of shingles   . HLD (hyperlipidemia)    pt denies  . IBS (irritable bowel syndrome)   . Osteopenia   . Sarcoidosis   . Sliding hiatal hernia     Past Surgical History:  Procedure Laterality Date  . APPENDECTOMY    . BASAL CELL CARCINOMA EXCISION     scalp  . BASAL CELL CARCINOMA EXCISION  08/2016   nose  . COLONOSCOPY  02/2010   divertics, rpt 5 years  . DEXA  2005   osteopenia, no change from 2003  . DILATION AND CURETTAGE OF UTERUS  10/1999  . LAPAROSCOPIC TOTAL HYSTERECTOMY  10/03   for fibroids/polyp  . MASTECTOMY Right 02/2009   right breast cancer  . POLYPECTOMY    . TOTAL ABDOMINAL HYSTERECTOMY  2003   for fibroids/polyps  . VIDEO BRONCHOSCOPY WITH ENDOBRONCHIAL NAVIGATION N/A 02/01/2017   Procedure: VIDEO BRONCHOSCOPY WITH ENDOBRONCHIAL NAVIGATION;  Surgeon: Roxan Hockey,  Revonda Standard, MD;  Location: [facility];  Service: Thoracic;  Laterality: N/A;    Social History   Socioeconomic History  . Marital status: Married    Spouse name: Not on file  . Number of children: 2  . Years of education: Not on file  . Highest education level: Not on file  Occupational History  . Occupation: Retired  Scientific laboratory technician  . Financial resource strain: Not on file  . Food insecurity:    Worry: Not on file    Inability: Not on file  . Transportation needs:    Medical: Not on  file    Non-medical: Not on file  Tobacco Use  . Smoking status: Former Smoker    Last attempt to quit: 08/31/1968    Years since quitting: 49.8  . Smokeless tobacco: Never Used  Substance and Sexual Activity  . Alcohol use: Yes    Alcohol/week: 0.0 standard drinks    Comment: 1-2 glasses of wine/weekly  . Drug use: No  . Sexual activity: Yes  Lifestyle  . Physical activity:    Days per week: Not on file    Minutes per session: Not on file  . Stress: Not on file  Relationships  . Social connections:    Talks on phone: Not on file    Gets together: Not on file    Attends religious service: Not on file    Active member of club or organization: Not on file    Attends meetings of clubs or organizations: Not on file    Relationship status: Not on file  . Intimate partner violence:    Fear of current or ex partner: Not on file    Emotionally abused: Not on file    Physically abused: Not on file    Forced sexual activity: Not on file  Other Topics Concern  . Not on file  Social History Narrative   Married      2 sons      Retired      2 cups coffee/daily      Gym 3-4 times/week    Family History  Problem Relation Age of Onset  . Prostate cancer Father   . Prostate cancer Brother   . Lymphoma Brother   . Heart attack Paternal Grandmother   . Stomach cancer Maternal Grandfather   . Colon cancer Neg Hx   . Rectal cancer Neg Hx   . Esophageal cancer Neg Hx     Allergies  Allergen Reactions  . Niacin And Related Other (See Comments)    Caused black and blue marks all over body  . Meloxicam Rash    Medication list reviewed and updated in full in [facility].  ROS as above, eating and drinking - tolerating PO. Urinating normally. No excessive vomitting or diarrhea. O/w as above.  Objective:   Blood pressure 114/80, pulse 70, temperature 98.7 F (37.1 C), temperature source Oral, height 5' 0.5" (1.537 m), weight 153 lb 4 oz (69.5 kg).  GEN: WDWN,  Non-toxic, Atraumatic, normocephalic. A and O x 3. HEENT: Oropharynx clear without exudate, MMM, no significant LAD, mild rhinnorhea Sinuses: Right Frontal, ethmoid, and maxillary: Tender Left Frontal, Ethmoid, and maxillary: Tender Ears: TM clear, COL visualized with good landmarks CV: RRR, no m/g/r. Pulm: CTA B, no wheezes, rhonchi, or crackles, normal respiratory effort. EXT: no c/c/e Psych: well oriented, neither depressed nor anxious in appearance  Assessment and Plan:   Acute non-recurrent maxillary sinusitis  Acute sinusitis: ABX as  below.   Reviewed symptomatic care as well as ABX in this case.  With prior AR  Follow-up: No follow-ups on file.  Meds ordered this encounter  Medications  . fluticasone (FLONASE) 50 MCG/ACT nasal spray    Sig: Place 2 sprays into both nostrils daily.    Dispense:  16 g    Refill:  3  . amoxicillin-clavulanate (AUGMENTIN) 875-125 MG tablet    Sig: Take 1 tablet by mouth 2 (two) times daily for 10 days.    Dispense:  20 tablet    Refill:  0    Signed,  Crystie Yanko T. Ayjah Show, MD  Patient's Medications  New Prescriptions   AMOXICILLIN-CLAVULANATE (AUGMENTIN) 875-125 MG TABLET    Take 1 tablet by mouth 2 (two) times daily for 10 days.   CIPROFLOXACIN (CIPRO) 500 MG TABLET    Take 1 tablet (500 mg total) by mouth 2 (two) times daily.   FLUTICASONE (FLONASE) 50 MCG/ACT NASAL SPRAY    Place 2 sprays into both nostrils daily.  Previous Medications   ALBUTEROL (PROVENTIL HFA;VENTOLIN HFA) 108 (90 BASE) MCG/ACT INHALER    Inhale 2 puffs into the lungs every 4 (four) hours as needed for wheezing or shortness of breath.   CALCIUM-VITAMIN D (OSCAL WITH D) 500-200 MG-UNIT PER TABLET    Take 1 tablet by mouth once a week.    DICLOFENAC SODIUM (VOLTAREN) 1 % GEL    Apply 2 g topically 3 (three) times daily as needed.   FIBER SELECT GUMMIES PO    Take 2 tablets by mouth at bedtime.   FISH OIL-OMEGA-3 FATTY ACIDS 1000 MG CAPSULE    Take 1 g by mouth once  a week.    IBUPROFEN (ADVIL,MOTRIN) 200 MG TABLET    Take 400 mg by mouth every 8 (eight) hours as needed.    METHOCARBAMOL (ROBAXIN) 500 MG TABLET    TAKE 1 TABLET BY MOUTH EVERY 8 HOURS AS NEEDED FOR MUSCLE SPASMS.   MULTIPLE VITAMIN (MULTIVITAMIN WITH MINERALS) TABS TABLET    Take 1 tablet by mouth at bedtime.   NEOMYCIN-POLYMYXIN-DEXAMETH (MAXITROL) 0.1 % OINT    Place 1 application into both eyes See admin instructions. PRN: TWICE DAILY X1 WEEK & THEN DECREASE TO ONCE DAILY FOR X2 WEEKS   OMEPRAZOLE (PRILOSEC) 20 MG CAPSULE    Take 1 capsule (20 mg total) by mouth daily before breakfast. TAKE ONE CAPSULE BY MOUTH DAILY AS NEEDED  Modified Medications   No medications on file  Discontinued Medications   No medications on file

## 2018-06-11 ENCOUNTER — Encounter: Payer: Self-pay | Admitting: Family Medicine

## 2018-06-16 DIAGNOSIS — M546 Pain in thoracic spine: Secondary | ICD-10-CM | POA: Diagnosis not present

## 2018-06-17 DIAGNOSIS — M5134 Other intervertebral disc degeneration, thoracic region: Secondary | ICD-10-CM | POA: Insufficient documentation

## 2018-06-21 DIAGNOSIS — M546 Pain in thoracic spine: Secondary | ICD-10-CM | POA: Diagnosis not present

## 2018-06-22 ENCOUNTER — Ambulatory Visit: Payer: Medicare HMO | Admitting: Internal Medicine

## 2018-06-23 ENCOUNTER — Encounter: Payer: Self-pay | Admitting: Internal Medicine

## 2018-06-23 ENCOUNTER — Ambulatory Visit: Payer: Medicare HMO | Admitting: Internal Medicine

## 2018-06-23 VITALS — BP 112/72 | HR 103 | Ht 60.5 in | Wt 152.4 lb

## 2018-06-23 DIAGNOSIS — D869 Sarcoidosis, unspecified: Secondary | ICD-10-CM | POA: Diagnosis not present

## 2018-06-23 NOTE — Patient Instructions (Signed)
USE ALBUTEROL AS NEEDED FLONASE AS NEEDED  PREDNISONE AS NEEDED for TRIP

## 2018-06-23 NOTE — Progress Notes (Signed)
Name: Judith [name] MRN: [id number] DOB: May 31, 1946     CONSULTATION DATE:06/23/2018  REFERRING MD :  Dr. Roxan Hockey  CHIEF COMPLAINT:   Follow-up sarcoid  STUDIES:  CT chest Jan 26, 2017 I have Independently reviewed images of  CT chest   on 06/23/2018 Interpretation:B/l pulm nodules  Previous history 72 year old pleasant white female seen today for abnormal CT chest with bilateral pulmonary nodules Patient has a diagnosis and history of sarcoidosis of the scalp In February 2018 patient developed a chronic cough patient was worked up for chest x-ray and a CT scan of the chest which revealed several pulmonary nodules Patient was then referred to cardiothoracic surgery for further evaluation Dr. 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 Niacin And Related Other (See Comments)    Caused black and blue marks all over body  . Meloxicam Rash       Review of Systems:  Gen:  Denies  fever, sweats, chills weigh loss  HEENT: Denies blurred vision, double vision, ear pain, eye pain, hearing loss, nose bleeds, sore throat Cardiac:  No dizziness, chest pain or heaviness, chest tightness,edema, No JVD Resp:    +shortness of breath, Gi: Denies swallowing difficulty, stomach pain, nausea or vomiting, diarrhea, constipation, bowel incontinence Gu:  Denies bladder incontinence, burning urine Ext:   Denies Joint pain, stiffness or swelling Skin: Denies  skin rash, easy bruising or bleeding or hives Endoc:  Denies polyuria, polydipsia , polyphagia or weight change Psych:   Denies depression, insomnia or hallucinations  Other:  All other systems negative    Ht 5' 0.5" (1.537 m)   Wt 152 lb 6.4 oz (69.1 kg)   BMI 29.27 kg/m  BP 112/72 (BP Location: Left Arm, Cuff Size: Normal)   Pulse (!) 103   Ht 5' 0.5" (1.537 m)   Wt 152 lb 6.4 oz (69.1 kg)   SpO2 95%   BMI 29.27 kg/m    Physical Examination:   GENERAL:NAD, no fevers, chills, no weakness no fatigue HEAD: Normocephalic, atraumatic.  EYES: Pupils equal, round, reactive to light. Extraocular muscles intact. No scleral icterus.  MOUTH: Moist mucosal membrane. Dentition intact. No abscess noted.  EAR, NOSE, THROAT: Clear without exudates. No external lesions.  NECK: Supple. No thyromegaly. No nodules. No JVD.  PULMONARY: CTA B/L no wheezing, rhonchi, crackles CARDIOVASCULAR: S1 and S2. Regular rate and rhythm. No murmurs, rubs, or gallops. No edema. Pedal pulses 2+ bilaterally.  GASTROINTESTINAL: Soft,  nontender, nondistended. No masses. Positive bowel sounds. No hepatosplenomegaly.  MUSCULOSKELETAL: No swelling, clubbing, or edema. Range of motion full in all extremities.  NEUROLOGIC: Cranial nerves II through XII are intact. No gross focal neurological deficits. Sensation intact. Reflexes intact.  SKIN: No ulceration, lesions, rashes, or cyanosis. Skin warm and dry. Turgor  intact.  PSYCHIATRIC: Mood, affect within normal limits. The patient is awake, alert and oriented x 3. Insight, judgment intact.  ALL OTHER ROS ARE NEGATIVE      ASSESSMENT / PLAN:   72 -year-old pleasant white female seen today for chronic cough patient with history of cutaneous sarcoidosis and with history of bilateral pulmonary nodules status post navigation bronchoscopy in [place] and findings consistent with pulmonary sarcoid  At this time patient has chronic shortness of breath with exertion is stable Patient has been on prednisone taper for her back and her SOB has improved and eye inflammation has improved  At this time patient does not want to start Plaquenil or other immunosuppressive therapy at this time she does not want cough suppressant at this time  #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 All questions answered Follow-up in 6 months  Vernetta Dizdarevic Patricia Pesa, M.D.  [facility]  Medical Director [place] Director [facility] Cardio-Pulmonary Department

## 2018-07-12 ENCOUNTER — Telehealth: Payer: Self-pay | Admitting: Internal Medicine

## 2018-07-12 NOTE — Telephone Encounter (Signed)
Patient scheduled for 11/14 at 3 pm.

## 2018-07-12 NOTE — Telephone Encounter (Signed)
Patient calling States since she got back from her vacation she has noticed her heart rate has been up but her oxygen level has gone down  Would like to know if this is a cause for concern and if she should make an appointment Patient has also sent a mychart message to Dr. Mortimer Fries but would like to speak with nurse  Please call to discuss

## 2018-07-14 ENCOUNTER — Ambulatory Visit
Admission: RE | Admit: 2018-07-14 | Discharge: 2018-07-14 | Disposition: A | Discharge status: Home / Self Care | Payer: Medicare HMO | Source: Ambulatory Visit | Attending: Internal Medicine | Admitting: Internal Medicine

## 2018-07-14 ENCOUNTER — Ambulatory Visit: Payer: Medicare HMO | Admitting: Internal Medicine

## 2018-07-14 ENCOUNTER — Encounter: Payer: Self-pay | Admitting: Internal Medicine

## 2018-07-14 VITALS — BP 142/90 | HR 97 | Ht 60.5 in | Wt 157.2 lb

## 2018-07-14 DIAGNOSIS — D869 Sarcoidosis, unspecified: Secondary | ICD-10-CM | POA: Diagnosis not present

## 2018-07-14 DIAGNOSIS — R05 Cough: Secondary | ICD-10-CM | POA: Diagnosis not present

## 2018-07-14 DIAGNOSIS — R918 Other nonspecific abnormal finding of lung field: Secondary | ICD-10-CM | POA: Diagnosis not present

## 2018-07-14 DIAGNOSIS — R059 Cough, unspecified: Secondary | ICD-10-CM

## 2018-07-14 MED ORDER — PREDNISONE 20 MG PO TABS: 20.0000 mg | ORAL_TABLET | Freq: Every day | ORAL | 0 refills | Status: DC

## 2018-07-14 MED ORDER — GUAIFENESIN-CODEINE 100-10 MG/5ML PO SOLN: 5.0000 mL | ORAL | 0 refills | Status: DC | PRN

## 2018-07-14 NOTE — Progress Notes (Signed)
Name: Judith [name] MRN: [id number] DOB: [birth year]     CONSULTATION DATE:07/14/2018  REFERRING MD :  Dr. Roxan Hockey  CHIEF COMPLAINT:   Follow-up sarcoid  STUDIES:  CT chest Jan 26, 2017 I have Independently reviewed images of  CT chest    Interpretation:B/l pulm nodules  Previous history 72 year old pleasant white female seen today for abnormal CT chest with bilateral pulmonary nodules Patient has a diagnosis and history of sarcoidosis of the scalp In February 2018 patient developed a chronic cough patient was worked up for chest x-ray and a CT scan of the chest which revealed several pulmonary nodules Patient was then referred to cardiothoracic surgery for further evaluation Dr. 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 Niacin And Related Other (See Comments)    Caused black and blue marks all over body  . Meloxicam Rash     Review of Systems:  Gen:   Denies  fever, sweats, chills weigh loss  HEENT: Denies blurred vision, double vision, ear pain, eye pain, hearing loss, nose bleeds, sore throat Cardiac:  No dizziness, chest pain or heaviness, chest tightness,edema, No JVD Resp:   +cough -sputum production, +shortness of breath,+wheezing, -hemoptysis,  Gi: Denies swallowing difficulty, stomach pain, nausea or vomiting, diarrhea, constipation, bowel incontinence Gu:  Denies bladder incontinence, burning urine Ext:   Denies Joint pain, stiffness or swelling Skin: + skin rash, +itching Endoc:  Denies polyuria, polydipsia , polyphagia or weight change Psych:   Denies depression, insomnia or hallucinations  Other:  All other systems negative     Ht 5' 0.5" (1.537 m)   BMI 29.27 kg/m  Ht 5' 0.5" (1.537 m)   BMI 29.27 kg/m  BP (!) 142/90 (BP Location: Left Arm, Cuff Size: Normal)   Pulse 97   Ht 5' 0.5" (1.537 m)   Wt 157 lb 3.2 oz (71.3 kg)   SpO2 90%   BMI 30.20 kg/m    Physical Examination:   GENERAL:NAD, no fevers, chills, no weakness no fatigue HEAD: Normocephalic, atraumatic.  EYES: Pupils equal, round, reactive to light. Extraocular muscles intact. No scleral icterus.  MOUTH: Moist mucosal membrane. Dentition intact. No abscess noted.  EAR, NOSE, THROAT: Clear without exudates. No external lesions.  NECK: Supple. No thyromegaly. No nodules. No JVD.  PULMONARY: CTA B/L no wheezing, rhonchi, crackles CARDIOVASCULAR: S1 and S2. Regular rate and rhythm. No murmurs, rubs, or gallops. No edema. Pedal pulses 2+ bilaterally.  GASTROINTESTINAL: Soft, nontender, nondistended. No masses. Positive bowel sounds. No  hepatosplenomegaly.  MUSCULOSKELETAL: No swelling, clubbing, or edema. Range of motion full in all extremities.  NEUROLOGIC: Cranial nerves II through XII are intact. No gross focal neurological deficits. Sensation intact. Reflexes intact.  SKIN: + rashes PSYCHIATRIC: Mood, affect within normal limits. The patient is  awake, alert and oriented x 3. Insight, judgment intact.  ALL OTHER ROS ARE NEGATIVE         ASSESSMENT / PLAN:   72 yo with pulmonary sarcoidosis with cutaneous sarcoidosis with acute NON productive cough with intermittent wheezing most likely with acute Tracheobronchitis probably from sarcoidosis or viral etiology  1.start prednisone 20 mg daily for 10 days 2.benedryl as needed 3.robitussin with codeine  as needed 4.check ONO and 6MWT 5.check CXR   Follow up in 1 month  Lanice Folden Patricia Pesa, M.D.  [facility]  Medical Director [place] Director [place] Department

## 2018-07-14 NOTE — Patient Instructions (Addendum)
Get CXR 2 view  Start Prednisone 20 mg daily for 10 days  Benadryl as needed for itching  Check ONO and 6MWT for hypoxia  Cough syrup as needed

## 2018-07-19 ENCOUNTER — Telehealth: Payer: Self-pay

## 2018-07-19 NOTE — Telephone Encounter (Signed)
Spoke to patient, let her know Dr. Mortimer Fries states x-ray is very similar to last x-ray, scarring noted in the lungs. Patient has no further questions at this time.

## 2018-07-22 ENCOUNTER — Ambulatory Visit (INDEPENDENT_AMBULATORY_CARE_PROVIDER_SITE_OTHER): Payer: Medicare HMO | Admitting: *Deleted

## 2018-07-22 DIAGNOSIS — R0602 Shortness of breath: Secondary | ICD-10-CM | POA: Diagnosis not present

## 2018-07-22 DIAGNOSIS — M25552 Pain in left hip: Secondary | ICD-10-CM | POA: Diagnosis not present

## 2018-07-22 DIAGNOSIS — M7062 Trochanteric bursitis, left hip: Secondary | ICD-10-CM | POA: Diagnosis not present

## 2018-07-22 NOTE — Progress Notes (Signed)
SIX MIN WALK 07/22/2018  Medications none  Supplimental Oxygen during Test? (L/min) No  Laps 8  Partial Lap (in Meters) 0  Baseline BP (sitting) 116/80  Baseline Heartrate 109  Baseline Dyspnea (Borg Scale) 4  Baseline Fatigue (Borg Scale) 8  Baseline SPO2 94  BP (sitting) 122/74  Heartrate 125  Dyspnea (Borg Scale) 7  Fatigue (Borg Scale) 8  SPO2 95  BP (sitting) 114/70  Heartrate 103  SPO2 95  Stopped or Paused before Six Minutes No  Distance Completed 384  Tech Comments: pt walked a brisk walk and was able to conversate the entire walk. She didn't complain of any sob, pain and or chest tightness.      SMW performed today.

## 2018-07-26 DIAGNOSIS — J984 Other disorders of lung: Secondary | ICD-10-CM | POA: Diagnosis not present

## 2018-07-27 DIAGNOSIS — J984 Other disorders of lung: Secondary | ICD-10-CM | POA: Diagnosis not present

## 2018-08-02 ENCOUNTER — Telehealth: Payer: Self-pay | Admitting: *Deleted

## 2018-08-02 DIAGNOSIS — J984 Other disorders of lung: Secondary | ICD-10-CM

## 2018-08-02 NOTE — Telephone Encounter (Signed)
Patient aware.

## 2018-08-02 NOTE — Telephone Encounter (Signed)
Per DK patient needs 2 liters 02 qhs. LMTCB Orders pending DME Huey Romans

## 2018-08-03 DIAGNOSIS — D869 Sarcoidosis, unspecified: Secondary | ICD-10-CM | POA: Diagnosis not present

## 2018-08-10 ENCOUNTER — Ambulatory Visit: Payer: Medicare HMO | Admitting: Internal Medicine

## 2018-08-10 ENCOUNTER — Encounter: Payer: Self-pay | Admitting: Internal Medicine

## 2018-08-10 VITALS — BP 118/82 | HR 87 | Ht 60.0 in | Wt 156.4 lb

## 2018-08-10 DIAGNOSIS — G4719 Other hypersomnia: Secondary | ICD-10-CM | POA: Diagnosis not present

## 2018-08-10 DIAGNOSIS — D869 Sarcoidosis, unspecified: Secondary | ICD-10-CM | POA: Diagnosis not present

## 2018-08-10 NOTE — Progress Notes (Signed)
Name: Judith [name] MRN: [id number] DOB: [birth year]     CONSULTATION DATE:08/10/2018  REFERRING MD :  Dr. Roxan Hockey  CHIEF COMPLAINT:   Follow-up sarcoid  STUDIES:  CT chest Jan 26, 2017 I have Independently reviewed images of  CT chest    Interpretation:B/l pulm nodules  Previous history 72 year old pleasant white female seen today for abnormal CT chest with bilateral pulmonary nodules Patient has a diagnosis and history of sarcoidosis of the scalp In February 2018 patient developed a chronic cough patient was worked up for chest x-ray and a CT scan of the chest which revealed several pulmonary nodules Patient was then referred to cardiothoracic surgery for further evaluation Dr. 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 Patient sees ophthalmology on an annual basis to assess for sarcoid  At this time patient refuses Plaquenil therapy Patient also refuses immunotherapy    HISTORY OF PRESENT ILLNESS:   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 Niacin And Related Other (See Comments)    Caused black and blue marks all over body  . Meloxicam Rash       Review of Systems:  Gen:  Denies  fever, sweats, chills weigh loss +fatigue HEENT: Denies blurred vision, double vision, ear pain, eye pain, hearing loss, nose bleeds, sore throat Cardiac:  No dizziness, chest pain or heaviness, chest tightness,edema, No JVD Resp:   No cough, -sputum production, -shortness of breath,-wheezing, -hemoptysis,  Gi: Denies swallowing difficulty, stomach pain, nausea or vomiting, diarrhea, constipation, bowel incontinence Gu:  Denies bladder incontinence, burning urine Ext:   Denies Joint pain, stiffness or swelling Skin: Denies  skin rash, easy bruising or bleeding or hives Endoc:  Denies polyuria, polydipsia , polyphagia or weight change Psych:   Denies depression, insomnia or hallucinations  Other:  All other systems negative    BP 118/82   Pulse 87   Ht 5' (1.524 m)   Wt 156 lb 6.4 oz (70.9 kg)   SpO2 97%   BMI 30.54 kg/m     Physical Examination:   GENERAL:NAD, no fevers, chills, no weakness + fatigue HEAD: Normocephalic, atraumatic.  EYES: Pupils equal, round, reactive to light. Extraocular muscles intact. No scleral icterus.  MOUTH: Moist mucosal membrane. Dentition intact. No abscess noted.  EAR, NOSE, THROAT: Clear without exudates. No external lesions.  NECK: Supple. No thyromegaly. No nodules. No JVD.  PULMONARY: CTA B/L no wheezing, rhonchi, crackles CARDIOVASCULAR:  S1 and S2. Regular rate and rhythm. No murmurs, rubs, or gallops. No edema. Pedal pulses 2+ bilaterally.  GASTROINTESTINAL: Soft, nontender, nondistended. No masses. Positive bowel sounds. No hepatosplenomegaly.  MUSCULOSKELETAL: No swelling, clubbing, or edema. Range of motion full in all extremities.  NEUROLOGIC:  Cranial nerves II through XII are intact. No gross focal neurological deficits. Sensation intact. Reflexes intact.  SKIN: No ulceration, lesions, rashes, or cyanosis. Skin warm and dry. Turgor intact.  PSYCHIATRIC: Mood, affect within normal limits. The patient is awake, alert and oriented x 3. Insight, judgment intact.  ALL OTHER ROS ARE NEGATIVE       ASSESSMENT / PLAN:  72 yo pleasant white female with pulmonary sarcoidosis Stage 4 with nocturnal hypoxia with excessive daytime sleepiness with snoring and low energy.  Plan for home sleep study to assess for OSA  Sarcoidosis seems to be stable at this time, resp issues are not active Has intermittent cough No need for prednisone or ABX at this time   Follow up after sleep study completed  Corrin Parker, M.D.  [facility]  Medical Director [place] Director [facility] Cardio-Pulmonary Department

## 2018-08-10 NOTE — Patient Instructions (Signed)
Plan for Sleep apnea test  Continue Oxygen as prescribed

## 2018-08-19 ENCOUNTER — Telehealth: Payer: Self-pay | Admitting: Internal Medicine

## 2018-08-19 DIAGNOSIS — D869 Sarcoidosis, unspecified: Secondary | ICD-10-CM

## 2018-08-19 NOTE — Telephone Encounter (Signed)
Can obtain CT chest without contrast Dx sarcoid

## 2018-08-19 NOTE — Telephone Encounter (Signed)
Spoke to patient regarding sleep study. Let her know that Dr. Juanell Fairly is out of the office until Jan. Patient is also very concerned about her current state, saying that "something is off, and she feels she needs a CT scan." Let her know that I will send to Dr. Mortimer Fries and let her know when I get a response. Patient understands.

## 2018-08-22 NOTE — Telephone Encounter (Signed)
Spoke with patient, she is aware CT order entered and we will be calling her back to schedule once insurance authorization has been approved.

## 2018-08-26 ENCOUNTER — Telehealth: Payer: Self-pay

## 2018-08-26 NOTE — Telephone Encounter (Signed)
Central scheduling calling re:CT scan. Order needs to be signed, cannot take verbal from RN, must be MD. Will forward to MD.

## 2018-08-29 ENCOUNTER — Ambulatory Visit: Admission: RE | Admit: 2018-08-29 | Payer: Medicare HMO | Source: Ambulatory Visit

## 2018-09-03 DIAGNOSIS — D869 Sarcoidosis, unspecified: Secondary | ICD-10-CM | POA: Diagnosis not present

## 2018-09-05 ENCOUNTER — Ambulatory Visit
Admission: RE | Admit: 2018-09-05 | Discharge: 2018-09-05 | Disposition: A | Discharge status: Home / Self Care | Payer: Medicare HMO | Source: Ambulatory Visit | Attending: Internal Medicine | Admitting: Internal Medicine

## 2018-09-05 DIAGNOSIS — D869 Sarcoidosis, unspecified: Secondary | ICD-10-CM | POA: Diagnosis not present

## 2018-09-06 DIAGNOSIS — G4733 Obstructive sleep apnea (adult) (pediatric): Secondary | ICD-10-CM | POA: Diagnosis not present

## 2018-09-07 ENCOUNTER — Telehealth: Payer: Self-pay | Admitting: Internal Medicine

## 2018-09-07 DIAGNOSIS — G4719 Other hypersomnia: Secondary | ICD-10-CM

## 2018-09-07 DIAGNOSIS — M25552 Pain in left hip: Secondary | ICD-10-CM | POA: Insufficient documentation

## 2018-09-07 DIAGNOSIS — G4733 Obstructive sleep apnea (adult) (pediatric): Secondary | ICD-10-CM

## 2018-09-07 NOTE — Telephone Encounter (Signed)
Dicussed CT scan results-no changes over last 2 years Dicussed Sleep study reports She has sleep apnea-final report pending

## 2018-09-08 NOTE — Telephone Encounter (Signed)
Spoke to patient regarding her sleep study.   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 Recommen auto-CPAP with pressure 5-15 cm H2O, recommend ONO while on CPAP.  When speaking to patient she asked if she is to continue O2 at night. Will verify and let her know.

## 2018-09-08 NOTE — Telephone Encounter (Signed)
Patient would like to know if CT Scan will be in her MY Chart to view.

## 2018-09-08 NOTE — Telephone Encounter (Signed)
Start autoCPAP 1-15 cm h20 on RA  Obtain ONO while on CPAP as per recommendations

## 2018-09-09 DIAGNOSIS — M25552 Pain in left hip: Secondary | ICD-10-CM | POA: Diagnosis not present

## 2018-09-09 DIAGNOSIS — M7062 Trochanteric bursitis, left hip: Secondary | ICD-10-CM | POA: Diagnosis not present

## 2018-09-09 NOTE — Telephone Encounter (Signed)
Spoke to patient and confirmed that she will use her CPAP without oxygen, then do the ONO and see if we need to bleed in O2. She is aware. Orders entered.

## 2018-09-09 NOTE — Addendum Note (Signed)
Addended by: [name] on: 09/09/2018 09:00 AM   Modules accepted: Orders

## 2018-09-14 NOTE — Addendum Note (Signed)
Addended by: Maryanna Shape A on: 09/14/2018 01:31 PM   Modules accepted: Orders

## 2018-09-16 DIAGNOSIS — M25552 Pain in left hip: Secondary | ICD-10-CM | POA: Diagnosis not present

## 2018-09-16 DIAGNOSIS — G4733 Obstructive sleep apnea (adult) (pediatric): Secondary | ICD-10-CM | POA: Diagnosis not present

## 2018-09-20 DIAGNOSIS — M7062 Trochanteric bursitis, left hip: Secondary | ICD-10-CM | POA: Diagnosis not present

## 2018-09-21 DIAGNOSIS — R69 Illness, unspecified: Secondary | ICD-10-CM | POA: Diagnosis not present

## 2018-09-27 DIAGNOSIS — G4733 Obstructive sleep apnea (adult) (pediatric): Secondary | ICD-10-CM | POA: Diagnosis not present

## 2018-09-30 DIAGNOSIS — R918 Other nonspecific abnormal finding of lung field: Secondary | ICD-10-CM | POA: Diagnosis not present

## 2018-09-30 DIAGNOSIS — R0602 Shortness of breath: Secondary | ICD-10-CM | POA: Diagnosis not present

## 2018-09-30 DIAGNOSIS — R911 Solitary pulmonary nodule: Secondary | ICD-10-CM | POA: Diagnosis not present

## 2018-09-30 DIAGNOSIS — D869 Sarcoidosis, unspecified: Secondary | ICD-10-CM | POA: Diagnosis not present

## 2018-10-03 ENCOUNTER — Telehealth: Payer: Self-pay | Admitting: Internal Medicine

## 2018-10-04 DIAGNOSIS — D869 Sarcoidosis, unspecified: Secondary | ICD-10-CM | POA: Diagnosis not present

## 2018-10-04 NOTE — Telephone Encounter (Signed)
LMTCB

## 2018-10-04 NOTE — Telephone Encounter (Signed)
ok 

## 2018-10-04 NOTE — Telephone Encounter (Signed)
Contacted patient and made aware ONO needed before we can send d/c order for 02 pickup. She is scheduled to do ONO on cpap 2/26. Will f/u after ONO resulted.

## 2018-10-17 DIAGNOSIS — G4733 Obstructive sleep apnea (adult) (pediatric): Secondary | ICD-10-CM | POA: Diagnosis not present

## 2018-10-21 DIAGNOSIS — R0602 Shortness of breath: Secondary | ICD-10-CM | POA: Diagnosis not present

## 2018-10-26 DIAGNOSIS — G4734 Idiopathic sleep related nonobstructive alveolar hypoventilation: Secondary | ICD-10-CM | POA: Diagnosis not present

## 2018-10-28 ENCOUNTER — Telehealth: Payer: Self-pay | Admitting: *Deleted

## 2018-10-28 NOTE — Telephone Encounter (Signed)
Called patient with results of ONO.  2 liters needed with cpap. Pt aware. She already has 02.

## 2018-10-29 ENCOUNTER — Other Ambulatory Visit: Payer: Self-pay | Admitting: Family Medicine

## 2018-10-31 NOTE — Telephone Encounter (Signed)
CPE scheduled for 01/20/19, last filled on 01/07/18 #30 tabs with 5 refills

## 2018-11-02 ENCOUNTER — Ambulatory Visit: Payer: Medicare HMO | Admitting: Internal Medicine

## 2018-11-02 ENCOUNTER — Telehealth: Payer: Self-pay | Admitting: Internal Medicine

## 2018-11-02 ENCOUNTER — Encounter: Payer: Self-pay | Admitting: Internal Medicine

## 2018-11-02 VITALS — BP 142/80 | HR 90 | Ht 60.0 in | Wt 159.0 lb

## 2018-11-02 DIAGNOSIS — D869 Sarcoidosis, unspecified: Secondary | ICD-10-CM | POA: Diagnosis not present

## 2018-11-02 DIAGNOSIS — Z9989 Dependence on other enabling machines and devices: Secondary | ICD-10-CM

## 2018-11-02 DIAGNOSIS — R05 Cough: Secondary | ICD-10-CM

## 2018-11-02 DIAGNOSIS — G4733 Obstructive sleep apnea (adult) (pediatric): Secondary | ICD-10-CM

## 2018-11-02 DIAGNOSIS — R059 Cough, unspecified: Secondary | ICD-10-CM

## 2018-11-02 NOTE — Telephone Encounter (Signed)
Called and spoke to pt.  Pt is requesting Rx for portable oxygen concentrator to bleed into travel cpap. Pt would also like a Rx for mini cpap machine. Pt would like to purchase these machines out of pocket. Pt requesting that Rx be mailed to address on file.   Dr. Mortimer Fries please advise if okay to order? Thanks

## 2018-11-02 NOTE — Progress Notes (Signed)
Name: Judith [name] MRN: [id number] DOB: 1945/11/26     CONSULTATION DATE:11/02/2018  REFERRING MD :  Dr. Roxan Hockey  CHIEF COMPLAINT:   Follow-up sarcoid  STUDIES:  CT chest Jan 26, 2017 I have Independently reviewed images of  CT chest    Interpretation:B/l pulm nodules  Previous history 73 year old pleasant white female seen today for abnormal CT chest with bilateral pulmonary nodules Patient has a diagnosis and history of sarcoidosis of the scalp In February 2018 patient developed a chronic cough patient was worked up for chest x-ray and a CT scan of the chest which revealed several pulmonary nodules Patient was then referred to cardiothoracic surgery for further evaluation Dr. 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 Patient sees ophthalmology on an annual basis to assess for sarcoid  At this time patient refuses Plaquenil therapy Patient also refuses immunotherapy  6MWT test WNL ONO revelas signs hypoxia Placed on 2 l  oxygen therapy   Chief complaint Follow-up sleep apnea  follow-up sarcoid Follow-up hypoxia  HISTORY OF PRESENT ILLNESS:   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 Niacin And Related Other (See Comments)    Caused black and blue marks all over body  . Meloxicam Rash       Review of Systems:  Gen:  Denies  fever, sweats, chills weigh loss  HEENT: Denies blurred vision, double vision, ear pain, eye pain, hearing loss, nose bleeds, sore throat Cardiac:  No dizziness, chest pain or heaviness, chest tightness,edema, No JVD Resp:   No cough, -sputum production, -shortness of breath,-wheezing, -hemoptysis,  Gi: Denies swallowing difficulty, stomach pain, nausea or vomiting, diarrhea, constipation, bowel incontinence Gu:  Denies bladder incontinence, burning urine Ext:   Denies Joint pain, stiffness or swelling Skin: Denies  skin rash, easy bruising or bleeding or hives Endoc:  Denies polyuria, polydipsia , polyphagia or weight change Psych:   Denies depression, insomnia or hallucinations  Other:  All other systems negative     Ht 5' (1.524 m)   Wt 159 lb (72.1 kg)   BMI 31.05 kg/m  BP (!) 142/80 (BP Location: Left Arm, Cuff Size: Normal)   Pulse 90   Ht 5' (1.524 m)   Wt 159 lb (72.1 kg)   SpO2 95%   BMI 31.05 kg/m     Physical Examination:   GENERAL:NAD,  no fevers, chills, no weakness no fatigue HEAD: Normocephalic, atraumatic.  EYES: PERLA, EOMI No scleral icterus.  MOUTH: Moist mucosal membrane.  EAR, NOSE, THROAT: Clear without exudates. No external lesions.  NECK: Supple. No thyromegaly.  No JVD.  PULMONARY: CTA B/L no wheezing, rhonchi, crackles CARDIOVASCULAR: S1 and S2. Regular rate and rhythm. No murmurs GASTROINTESTINAL:  Soft, nontender, nondistended. Positive bowel sounds.  MUSCULOSKELETAL: No swelling, clubbing, or edema.  NEUROLOGIC: No gross focal neurological deficits. 5/5 strength all extremities SKIN: No ulceration, lesions, rashes, or cyanosis.  PSYCHIATRIC: Insight, judgment intact. 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  Patient underwent a second opinion at [facility] who agrees with our assessment however patient was found to have a mild cardiac condition needs to follow with cardiology  Sarcoidosis seems to be stable at this time No active respiratory issues No significant changes in breathing No indication for steroids or immunosuppressive therapy at this time Follow-up CT chest at [facility] in 6 months  OSA moderate Continue auto CPAP 5 to 15 cm of pressure Excellent compliance report AHI down to 0.2 Continue therapy as prescribed Patient uses nasal pillows She uses and benefits from CPAP therapy  Cardiac condition mild CAD Follow-up with cardiology as outpatient   Patient satisfied with office visit Follow-up in 6 months  Kerith Sherley Patricia Pesa, M.D.  [facility]  Medical Director [place] Director [place] Department

## 2018-11-02 NOTE — Patient Instructions (Signed)
Continue AUTO CPAP as prescribed with Oxygen  Follow up [facility] with CT chest  Follow up Eye Exam  Follow up Dermatology

## 2018-11-03 NOTE — Telephone Encounter (Signed)
Yes please order accordingly

## 2018-11-04 ENCOUNTER — Telehealth: Payer: Self-pay | Admitting: Internal Medicine

## 2018-11-04 NOTE — Telephone Encounter (Signed)
Please see phone note dated as 11/02/18. Thanks

## 2018-11-04 NOTE — Telephone Encounter (Signed)
Order has been printed and placed in [facility] box to sign. Will update chart once order has been signed and mailed off to patient.

## 2018-11-04 NOTE — Telephone Encounter (Signed)
Pt would like to pick Rx up when signed. Please see 11/04/18 mychart encounter.

## 2018-11-04 NOTE — Telephone Encounter (Signed)
Returned call to patient, made aware orders have been placed and we are awaiting a signature from Dr. Mortimer Fries.   While on phone, she states she was told that she did not need oxygen during the day only at night. Upon review I do not see anything in AVS to D/C oxygen.   Dr. Mortimer Fries please advise if okay to send order to D/C oxygen. Thanks.

## 2018-11-07 NOTE — Telephone Encounter (Signed)
Signed forms

## 2018-11-07 NOTE — Telephone Encounter (Signed)
Call made to patient, made aware orders have been signed and they are awaiting pick up. Voiced understanding. Patient made aware we would be sending in order to D/C daytime oxygen. Nothing further is needed at this time.

## 2018-11-15 DIAGNOSIS — G4733 Obstructive sleep apnea (adult) (pediatric): Secondary | ICD-10-CM | POA: Diagnosis not present

## 2018-11-17 ENCOUNTER — Encounter: Payer: Self-pay | Admitting: Family Medicine

## 2018-11-17 ENCOUNTER — Ambulatory Visit (INDEPENDENT_AMBULATORY_CARE_PROVIDER_SITE_OTHER): Payer: Medicare HMO | Admitting: Family Medicine

## 2018-11-17 ENCOUNTER — Ambulatory Visit: Payer: Medicare HMO | Admitting: Primary Care

## 2018-11-17 ENCOUNTER — Other Ambulatory Visit: Payer: Self-pay

## 2018-11-17 VITALS — BP 100/62 | HR 80 | Temp 98.5°F | Ht 60.5 in | Wt 157.0 lb

## 2018-11-17 DIAGNOSIS — K5792 Diverticulitis of intestine, part unspecified, without perforation or abscess without bleeding: Secondary | ICD-10-CM | POA: Diagnosis not present

## 2018-11-17 MED ORDER — CIPROFLOXACIN HCL 500 MG PO TABS: 500.0000 mg | ORAL_TABLET | Freq: Two times a day (BID) | ORAL | 0 refills | Status: DC

## 2018-11-17 MED ORDER — METRONIDAZOLE 500 MG PO TABS: 500.0000 mg | ORAL_TABLET | Freq: Three times a day (TID) | ORAL | 0 refills | Status: DC

## 2018-11-17 NOTE — Progress Notes (Signed)
Subjective:    Patient ID: Judith [name], female    DOB: 18-May-1946, 73 y.o.   MRN: [id number]  HPI    73 year old female pt of Dr. Marliss Coots with history of diverticulosis as well as diverticulitis in past presents with new onset abdominal pain in LLQ.  Pain constant but worse when lying down trying to sleep. When bouncing or walking pain is increased. Mild nausea, no emesis. NO D, had to strain with BM yesterday, was painful to move bowels but had full BM.   No fever. No dysuria. No vaginal discharge.  Her current pain feels similar to past diverticulitis.   S/P TAH, appendectomy.  Has not taken any medication to treat.   Hx of recurrent  Social History /Family History/Past Medical History reviewed in detail and updated in EMR if needed. Blood pressure 100/62, pulse 80, temperature 98.5 F (36.9 C), temperature source Oral, height 5' 0.5" (1.537 m), weight 157 lb (71.2 kg).   Review of Systems  Constitutional: Negative for fatigue and fever.  HENT: Negative for congestion.   Eyes: Negative for pain.  Respiratory: Negative for cough and shortness of breath.   Cardiovascular: Negative for chest pain, palpitations and leg swelling.  Gastrointestinal: Negative for abdominal pain.  Genitourinary: Negative for dysuria and vaginal bleeding.  Musculoskeletal: Negative for back pain.  Neurological: Negative for syncope, light-headedness and headaches.  Psychiatric/Behavioral: Negative for dysphoric mood.       Objective:   Physical Exam Constitutional:      General: She is not in acute distress.    Appearance: Normal appearance. She is well-developed. She is not ill-appearing or toxic-appearing.  HENT:     Head: Normocephalic.     Right Ear: Hearing, tympanic membrane, ear canal and external ear normal. Tympanic membrane is not erythematous, retracted or bulging.     Left Ear: Hearing, tympanic membrane, ear canal and external ear normal. Tympanic membrane is not  erythematous, retracted or bulging.     Nose: No mucosal edema or rhinorrhea.     Right Sinus: No maxillary sinus tenderness or frontal sinus tenderness.     Left Sinus: No maxillary sinus tenderness or frontal sinus tenderness.     Mouth/Throat:     Pharynx: Uvula midline.  Eyes:     General: Lids are normal. Lids are everted, no foreign bodies appreciated.     Conjunctiva/sclera: Conjunctivae normal.     Pupils: Pupils are equal, round, and reactive to light.  Neck:     Musculoskeletal: Normal range of motion and neck supple.     Thyroid: No thyroid mass or thyromegaly.     Vascular: No carotid bruit.     Trachea: Trachea normal.  Cardiovascular:     Rate and Rhythm: Normal rate and regular rhythm.     Pulses: Normal pulses.     Heart sounds: Normal heart sounds, S1 normal and S2 normal. No murmur. No friction rub. No gallop.   Pulmonary:     Effort: Pulmonary effort is normal. No tachypnea or respiratory distress.     Breath sounds: Normal breath sounds. No decreased breath sounds, wheezing, rhonchi or rales.  Abdominal:     General: Bowel sounds are normal.     Palpations: Abdomen is soft.     Tenderness: There is abdominal tenderness in the right lower quadrant and left lower quadrant. There is guarding. There is no rebound.     Hernia: No hernia is present. There is no hernia in the umbilical  area.     Comments: MAinly in LLQ  Skin:    General: Skin is warm and dry.     Findings: No rash.  Neurological:     Mental Status: She is alert.  Psychiatric:        Mood and Affect: Mood is not anxious or depressed.        Speech: Speech normal.        Behavior: Behavior normal. Behavior is cooperative.        Thought Content: Thought content normal.        Judgment: Judgment normal.           Assessment & Plan:  The patient's preventative maintenance and recommended screening tests for an annual wellness exam were reviewed in full today. Brought up to date unless services  declined.  Counselled on the importance of diet, exercise, and its role in overall health and mortality. The patient's FH and SH was reviewed, including their home life, tobacco status, and drug and alcohol status.

## 2018-11-17 NOTE — Assessment & Plan Note (Signed)
Will treat with broad course with cipro and flagyl x 10 days. Pt will call on Monday with an update and will follow up if not improving as expected.  ER and return precautions given.

## 2018-11-17 NOTE — Patient Instructions (Signed)
Please call on Monday with update on how you are feeling.  If not improving... make follow up appt.  Push fluids.  If fever on antibiotics or severe abdominal pain.Marland Kitchen go to ER.  If not tolerating antibiotics.. call.

## 2018-11-21 DIAGNOSIS — R0602 Shortness of breath: Secondary | ICD-10-CM | POA: Diagnosis not present

## 2018-11-21 DIAGNOSIS — D8685 Sarcoid myocarditis: Secondary | ICD-10-CM | POA: Diagnosis not present

## 2018-11-21 DIAGNOSIS — R9431 Abnormal electrocardiogram [ECG] [EKG]: Secondary | ICD-10-CM | POA: Diagnosis not present

## 2018-12-03 DIAGNOSIS — D869 Sarcoidosis, unspecified: Secondary | ICD-10-CM | POA: Diagnosis not present

## 2018-12-07 DIAGNOSIS — R9431 Abnormal electrocardiogram [ECG] [EKG]: Secondary | ICD-10-CM | POA: Diagnosis not present

## 2018-12-07 DIAGNOSIS — R0602 Shortness of breath: Secondary | ICD-10-CM | POA: Diagnosis not present

## 2018-12-16 DIAGNOSIS — G4733 Obstructive sleep apnea (adult) (pediatric): Secondary | ICD-10-CM | POA: Diagnosis not present

## 2018-12-21 ENCOUNTER — Other Ambulatory Visit: Payer: Self-pay | Admitting: Family Medicine

## 2018-12-28 DIAGNOSIS — G4733 Obstructive sleep apnea (adult) (pediatric): Secondary | ICD-10-CM | POA: Diagnosis not present

## 2019-01-02 DIAGNOSIS — D869 Sarcoidosis, unspecified: Secondary | ICD-10-CM | POA: Diagnosis not present

## 2019-01-04 ENCOUNTER — Ambulatory Visit: Payer: Medicare HMO

## 2019-01-11 ENCOUNTER — Encounter: Payer: Medicare HMO | Admitting: Family Medicine

## 2019-01-15 DIAGNOSIS — G4733 Obstructive sleep apnea (adult) (pediatric): Secondary | ICD-10-CM | POA: Diagnosis not present

## 2019-01-16 ENCOUNTER — Ambulatory Visit: Payer: Medicare HMO

## 2019-01-18 ENCOUNTER — Ambulatory Visit (INDEPENDENT_AMBULATORY_CARE_PROVIDER_SITE_OTHER): Payer: Medicare HMO

## 2019-01-18 ENCOUNTER — Other Ambulatory Visit: Payer: Self-pay

## 2019-01-18 ENCOUNTER — Other Ambulatory Visit (INDEPENDENT_AMBULATORY_CARE_PROVIDER_SITE_OTHER): Payer: Medicare HMO

## 2019-01-18 ENCOUNTER — Telehealth: Payer: Self-pay | Admitting: Family Medicine

## 2019-01-18 VITALS — Ht 60.5 in | Wt 155.0 lb

## 2019-01-18 DIAGNOSIS — K760 Fatty (change of) liver, not elsewhere classified: Secondary | ICD-10-CM

## 2019-01-18 DIAGNOSIS — Z853 Personal history of malignant neoplasm of breast: Secondary | ICD-10-CM

## 2019-01-18 DIAGNOSIS — Z1159 Encounter for screening for other viral diseases: Secondary | ICD-10-CM

## 2019-01-18 DIAGNOSIS — E7800 Pure hypercholesterolemia, unspecified: Secondary | ICD-10-CM

## 2019-01-18 DIAGNOSIS — Z0000 Encounter for general adult medical examination without abnormal findings: Secondary | ICD-10-CM

## 2019-01-18 DIAGNOSIS — D869 Sarcoidosis, unspecified: Secondary | ICD-10-CM

## 2019-01-18 DIAGNOSIS — R000 Tachycardia, unspecified: Secondary | ICD-10-CM

## 2019-01-18 LAB — LIPID PANEL
Cholesterol: 195 mg/dL (ref 0–200)
HDL: 46.2 mg/dL (ref >39.00)
LDL Cholesterol: 128 mg/dL — ABNORMAL HIGH (ref 0–99)
NonHDL: 148.43
Total CHOL/HDL Ratio: 4
Triglycerides: 100 mg/dL (ref 0.0–149.0)
VLDL: 20 mg/dL (ref 0.0–40.0)

## 2019-01-18 LAB — CBC WITH DIFFERENTIAL/PLATELET
Basophils Absolute: 0.1 10*3/uL (ref 0.0–0.1)
Basophils Relative: 0.9 % (ref 0.0–3.0)
Eosinophils Absolute: 0.5 10*3/uL (ref 0.0–0.7)
Eosinophils Relative: 7.1 % — ABNORMAL HIGH (ref 0.0–5.0)
HCT: 42.9 % (ref 36.0–46.0)
Hemoglobin: 14.7 g/dL (ref 12.0–15.0)
Lymphocytes Relative: 27.7 % (ref 12.0–46.0)
Lymphs Abs: 1.8 10*3/uL (ref 0.7–4.0)
MCHC: 34.2 g/dL (ref 30.0–36.0)
MCV: 92.3 fl (ref 78.0–100.0)
Monocytes Absolute: 0.4 10*3/uL (ref 0.1–1.0)
Monocytes Relative: 6.3 % (ref 3.0–12.0)
Neutro Abs: 3.7 10*3/uL (ref 1.4–7.7)
Neutrophils Relative %: 58 % (ref 43.0–77.0)
Platelets: 275 10*3/uL (ref 150.0–400.0)
RBC: 4.65 Mil/uL (ref 3.87–5.11)
RDW: 13.8 % (ref 11.5–15.5)
WBC: 6.5 10*3/uL (ref 4.0–10.5)

## 2019-01-18 LAB — COMPREHENSIVE METABOLIC PANEL
ALT: 22 U/L (ref 0–35)
AST: 17 U/L (ref 0–37)
Albumin: 4.1 g/dL (ref 3.5–5.2)
Alkaline Phosphatase: 77 U/L (ref 39–117)
BUN: 25 mg/dL — ABNORMAL HIGH (ref 6–23)
CO2: 26 mEq/L (ref 19–32)
Calcium: 9.1 mg/dL (ref 8.4–10.5)
Chloride: 104 mEq/L (ref 96–112)
Creatinine, Ser: 0.82 mg/dL (ref 0.40–1.20)
GFR: 68.16 mL/min (ref >60.00)
Glucose, Bld: 86 mg/dL (ref 70–99)
Potassium: 4.2 mEq/L (ref 3.5–5.1)
Sodium: 140 mEq/L (ref 135–145)
Total Bilirubin: 0.3 mg/dL (ref 0.2–1.2)
Total Protein: 6.9 g/dL (ref 6.0–8.3)

## 2019-01-18 LAB — TSH: TSH: 2.51 u[IU]/mL (ref 0.35–4.50)

## 2019-01-18 NOTE — Telephone Encounter (Signed)
-----   Message from Ellamae Sia sent at 01/18/2019  7:52 AM EDT ----- Regarding: lab orders for today  AWV lab orders, please.

## 2019-01-18 NOTE — Patient Instructions (Addendum)
Judith [name] , Thank you for taking time to come for your Medicare Wellness Visit. I appreciate your ongoing commitment to your health goals. Please review the following plan we discussed and let me know if I can assist you in the future.   These are the goals we discussed: Goals    . Patient Stated     Starting 01/18/19, I will continue to take medications as prescribed.        This is a list of the screening recommended for you and due dates:  Health Maintenance  Topic Date Due  . Flu Shot  04/01/2019  . Mammogram  02/12/2020  . Tetanus Vaccine  03/19/2021  . Colon Cancer Screening  07/28/2025  . DEXA scan (bone density measurement)  Completed  .  Hepatitis C: One time screening is recommended by Center for Disease Control  (CDC) for  adults born from [birth year] through 1966.   Completed  . Pneumonia vaccines  Completed   Preventive Care for Adults  A healthy lifestyle and preventive care can promote health and wellness. Preventive health guidelines for adults include the following key practices.  . A routine yearly physical is a good way to check with your health care provider about your health and preventive screening. It is a chance to share any concerns and updates on your health and to receive a thorough exam.  . Visit your dentist for a routine exam and preventive care every 6 months. Brush your teeth twice a day and floss once a day. Good oral hygiene prevents tooth decay and gum disease.  . The frequency of eye exams is based on your age, health, family medical history, use  of contact lenses, and other factors. Follow your health care provider's recommendations for frequency of eye exams.  . Eat a healthy diet. Foods like vegetables, fruits, whole grains, low-fat dairy products, and lean protein foods contain the nutrients you need without too many calories. Decrease your intake of foods high in solid fats, added sugars, and salt. Eat the right amount of calories for you. Get  information about a proper diet from your health care provider, if necessary.  . Regular physical exercise is one of the most important things you can do for your health. Most adults should get at least 150 minutes of moderate-intensity exercise (any activity that increases your heart rate and causes you to sweat) each week. In addition, most adults need muscle-strengthening exercises on 2 or more days a week.  Silver Sneakers may be a benefit available to you. To determine eligibility, you may visit the website: www.silversneakers.com or contact program at 2533942419 Mon-Fri between 8AM-8PM.   . Maintain a healthy weight. The body mass index (BMI) is a screening tool to identify possible weight problems. It provides an estimate of body fat based on height and weight. Your health care provider can find your BMI and can help you achieve or maintain a healthy weight.   For adults 20 years and older: ? A BMI below 18.5 is considered underweight. ? A BMI of 18.5 to 24.9 is normal. ? A BMI of 25 to 29.9 is considered overweight. ? A BMI of 30 and above is considered obese.   . Maintain normal blood lipids and cholesterol levels by exercising and minimizing your intake of saturated fat. Eat a balanced diet with plenty of fruit and vegetables. Blood tests for lipids and cholesterol should begin at age 43 and be repeated every 5 years. If your lipid or cholesterol  levels are high, you are over 50, or you are at high risk for heart disease, you may need your cholesterol levels checked more frequently. Ongoing high lipid and cholesterol levels should be treated with medicines if diet and exercise are not working.  . If you smoke, find out from your health care provider how to quit. If you do not use tobacco, please do not start.  . If you choose to drink alcohol, please do not consume more than 2 drinks per day. One drink is considered to be 12 ounces (355 mL) of beer, 5 ounces (148 mL) of wine, or 1.5  ounces (44 mL) of liquor.  . If you are 62-50 years old, ask your health care provider if you should take aspirin to prevent strokes.  . Use sunscreen. Apply sunscreen liberally and repeatedly throughout the day. You should seek shade when your shadow is shorter than you. Protect yourself by wearing long sleeves, pants, a wide-brimmed hat, and sunglasses year round, whenever you are outdoors.  . Once a month, do a whole body skin exam, using a mirror to look at the skin on your back. Tell your health care provider of new moles, moles that have irregular borders, moles that are larger than a pencil eraser, or moles that have changed in shape or color.

## 2019-01-18 NOTE — Progress Notes (Signed)
PCP notes:   Health maintenance:  No gaps identified.  Abnormal screenings:   None  Patient concerns:   None  Nurse concerns:  None  Next PCP appt:   01/20/19 @ 0981  I reviewed health advisor's note, was available for consultation, and agree with documentation and plan. Loura Pardon MD

## 2019-01-18 NOTE — Progress Notes (Signed)
Subjective:   Judith [name] is a 73 y.o. female who presents for Medicare Annual (Subsequent) preventive examination.  Review of Systems: N/A Cardiac Risk Factors include: advanced age (>82mn, >>57women);dyslipidemia     Objective:     Vitals: Ht 5' 0.5" (1.537 m) Comment: patient supplied  Wt 155 lb (70.3 kg) Comment: patient supplied  BMI 29.77 kg/m   Body mass index is 29.77 kg/m.  Advanced Directives 01/18/2019 12/24/2017 02/01/2017 01/29/2017 12/23/2016 12/12/2015 07/29/2015  Does Patient Have a Medical Advance Directive? Yes Yes - Yes Yes Yes Yes  Type of Advance Directive [place] will [place] will - [place] will [place] will [place] will [place] Does patient want to make changes to medical advance directive? - - - - - No - Patient declined -  Copy of [place] Chart? No - copy requested No - copy requested No - copy requested No - copy requested No - copy requested No - copy requested -    Tobacco Social History   Tobacco Use  Smoking Status Former Smoker  . Last attempt to quit: 08/31/1968  . Years since quitting: 50.4  Smokeless Tobacco Never Used     Counseling given: No   Clinical Intake:  Pre-visit preparation completed: Yes  Pain : 0-10 Pain Score: 3  Pain Type: Chronic pain Pain Location: Back(bilateral legs)     Nutritional Status: BMI 25 -29 Overweight Nutritional Risks: None  How often do you need to have someone help you when you read instructions, pamphlets, or other written materials from your doctor or pharmacy?: 1 - Never What is the last grade level you completed in school?: Bachelor degree  Interpreter Needed?: No  Comments: pt lives with spouse Information entered by :: LPinson, LPN  Past Medical History:  Diagnosis Date  . Allergic rhinitis   .  Allergy   . Arthritis   . Atrophic vaginitis   . [place]    [place] m[place]ctomy  . Diverticulosis   . GERD (gastroesophageal reflux disease)   . H/O: pneumonia    as child-viral pneumonia  . Headache   . History of shingles   . HLD (hyperlipidemia)    pt denies  . IBS (irritable bowel syndrome)   . Osteopenia   . Sarcoidosis   . Sliding hiatal hernia    Past Surgical History:  Procedure Laterality Date  . APPENDECTOMY    . BASAL CELL CARCINOMA EXCISION     scalp  . BASAL CELL CARCINOMA EXCISION  08/2016   nose  . COLONOSCOPY  02/2010   divertics, rpt 5 years  . DEXA  2005   osteopenia, no change from 2003  . DILATION AND CURETTAGE OF UTERUS  10/1999  . LAPAROSCOPIC TOTAL HYSTERECTOMY  10/03   for fibroids/polyp  . MASTECTOMY Right 02/2009   right breast cancer  . POLYPECTOMY    . TOTAL ABDOMINAL HYSTERECTOMY  2003   for fibroids/polyps  . VIDEO BRONCHOSCOPY WITH ENDOBRONCHIAL NAVIGATION N/A 02/01/2017   Procedure: VIDEO BRONCHOSCOPY WITH ENDOBRONCHIAL NAVIGATION;  Surgeon: HMelrose Nakayama MD;  [facility];  Service: Thoracic;  Laterality: N/A;   Family History  Problem Relation Age of Onset  . Prostate cancer Father   . Prostate cancer Brother   . Lymphoma Brother   . Heart attack Paternal Grandmother   . Stomach cancer Maternal Grandfather   .  Colon cancer Neg Hx   . Rectal cancer Neg Hx   . Esophageal cancer Neg Hx    Social History   Socioeconomic History  . Marital status: Married    Spouse name: Not on file  . Number of children: 2  . Years of education: Not on file  . Highest education level: Not on file  Occupational History  . Occupation: Retired  Scientific laboratory technician  . Financial resource strain: Not on file  . Food insecurity:    Worry: Not on file    Inability: Not on file  . Transportation needs:    Medical: Not on file    Non-medical: Not on file  Tobacco Use  . Smoking status: Former Smoker    Last attempt to quit: 08/31/1968     Years since quitting: 50.4  . Smokeless tobacco: Never Used  Substance and Sexual Activity  . Alcohol use: Not Currently    Alcohol/week: 0.0 standard drinks  . Drug use: No  . Sexual activity: Yes  Lifestyle  . Physical activity:    Days per week: Not on file    Minutes per session: Not on file  . Stress: Not on file  Relationships  . Social connections:    Talks on phone: Not on file    Gets together: Not on file    Attends religious service: Not on file    Active member of club or organization: Not on file    Attends meetings of clubs or organizations: Not on file    Relationship status: Not on file  Other Topics Concern  . Not on file  Social History Narrative   Married      2 sons      Retired      2 cups coffee/daily      Gym 3-4 times/week    Outpatient Encounter Medications as of 01/18/2019  Medication Sig  . albuterol (PROVENTIL HFA;VENTOLIN HFA) 108 (90 Base) MCG/ACT inhaler Inhale 2 puffs into the lungs every 4 (four) hours as needed for wheezing or shortness of breath.  . calcium-vitamin D (OSCAL WITH D) 500-200 MG-UNIT per tablet Take 1 tablet by mouth once a week.   . diclofenac sodium (VOLTAREN) 1 % GEL Apply 2 g topically 3 (three) times daily as needed.  Marland Kitchen FIBER SELECT GUMMIES PO Take 2 tablets by mouth at bedtime.  . fish oil-omega-3 fatty acids 1000 MG capsule Take 1 g by mouth once a week.   . fluticasone (FLONASE) 50 MCG/ACT nasal spray SPRAY 2 SPRAYS INTO EACH NOSTRIL EVERY DAY (Patient taking differently: as needed. )  . ibuprofen (ADVIL,MOTRIN) 200 MG tablet Take 400 mg by mouth every 8 (eight) hours as needed.   . methocarbamol (ROBAXIN) 500 MG tablet TAKE 1 TABLET BY MOUTH EVERY 8 HOURS AS NEEDED FOR MUSCLE SPASMS.  . Multiple Vitamin (MULTIVITAMIN WITH MINERALS) TABS tablet Take 1 tablet by mouth at bedtime.  Marland Kitchen neomycin-polymyxin-dexameth (MAXITROL) 0.1 % OINT Place 1 application into both eyes See admin instructions. PRN: TWICE DAILY X1 WEEK  & THEN DECREASE TO ONCE DAILY FOR X2 WEEKS  . omeprazole (PRILOSEC) 20 MG capsule Take 1 capsule (20 mg total) by mouth daily before breakfast. TAKE ONE CAPSULE BY MOUTH DAILY AS NEEDED  . [DISCONTINUED] ciprofloxacin (CIPRO) 500 MG tablet Take 1 tablet (500 mg total) by mouth 2 (two) times daily.  . [DISCONTINUED] metroNIDAZOLE (FLAGYL) 500 MG tablet Take 1 tablet (500 mg total) by mouth 3 (three) times daily.  No facility-administered encounter medications on file as of 01/18/2019.     Activities of Daily Living In your present state of health, do you have any difficulty performing the following activities: 01/18/2019  Hearing? N  Vision? Y  Comment has cataracts  Difficulty concentrating or making decisions? N  Walking or climbing stairs? N  Dressing or bathing? N  Doing errands, shopping? N  Preparing Food and eating ? N  Using the Toilet? N  In the past six months, have you accidently leaked urine? Y  Do you have problems with loss of bowel control? N  Managing your Medications? N  Managing your Finances? N  Housekeeping or managing your Housekeeping? N  Some recent data might be hidden    Patient Care Team: Tower, Wynelle Fanny, MD as PCP - General Oneta Rack, MD as Consulting Physician (Dermatology)    Assessment:   This is a routine wellness examination for [place].  Vision Screening Comments: Vision exam in Jan 2020  Exercise Activities and Dietary recommendations Current Exercise Habits: The patient does not participate in regular exercise at present, Exercise limited by: orthopedic condition(s)  Goals    . Patient Stated     Starting 01/18/19, I will continue to take medications as prescribed.        Fall Risk Fall Risk  01/18/2019 12/24/2017 12/23/2016 12/12/2015 12/05/2014  Falls in the past year? 0 Yes No No No  Comment - accident fall in bathtub; treatment in ER - - -  Number falls in past yr: - 1 - - -  Injury with Fall? - Yes - - -    Depression Screen  PHQ 2/9 Scores 01/18/2019 12/24/2017 12/23/2016 12/12/2015  PHQ - 2 Score 0 0 0 0  PHQ- 9 Score 0 0 - -     Cognitive Function MMSE - Mini Mental State Exam 01/18/2019 12/24/2017 12/23/2016 12/12/2015  Orientation to time 5 5 5 5   Orientation to Place 5 5 5 5   Registration 3 3 3 3   Attention/ Calculation 0 0 0 0  Recall 3 3 3 3   Language- name 2 objects 0 0 0 0  Language- repeat 1 1 1 1   Language- follow 3 step command 0 3 3 3   Language- read & follow direction 0 0 0 0  Write a sentence 0 0 0 0  Copy design 0 0 0 0  Total score 17 20 20 20         Immunization History  Administered Date(s) Administered  . Influenza Split 06/24/2011, 06/08/2012  . Influenza Whole 07/08/2007, 06/11/2008, 06/19/2009, 06/04/2010  . Influenza,inj,Quad PF,6+ Mos 06/15/2013, 06/21/2014, 07/31/2015, 06/10/2016, 06/15/2017, 05/25/2018  . Pneumococcal Conjugate-13 12/05/2014  . Pneumococcal Polysaccharide-23 03/20/2011  . Td 06/06/2001  . Tdap 03/20/2011    Screening Tests Health Maintenance  Topic Date Due  . INFLUENZA VACCINE  04/01/2019  . MAMMOGRAM  02/12/2020  . TETANUS/TDAP  03/19/2021  . COLONOSCOPY  07/28/2025  . DEXA SCAN  Completed  . Hepatitis C Screening  Completed  . PNA vac Low Risk Adult  Completed      Plan:      I have personally reviewed, addressed, and noted the following in the patient's chart:  A. Medical and social history B. Use of alcohol, tobacco or illicit drugs  C. Current medications and supplements D. Functional ability and status E.  Nutritional status F.  Physical activity G. Advance directives H. List of other physicians I.  Hospitalizations, surgeries, and ER visits in previous  12 months J.  Vitals (unless it is a telemedicine encounter) K. Screenings to include cognitive, depression, hearing, vision (NOTE: hearing and vision screenings not completed in telemedicine encounter) L. Referrals and appointments   In addition, I have reviewed and discussed with  patient certain preventive protocols, quality metrics, and best practice recommendations. A written personalized care plan for preventive services and recommendations were provided to patient.  With patient's permission, we connected on 01/18/19 at  9:30 AM EDT by a video enabled telemedicine application. Two patient identifiers were used to ensure the encounter occurred with the correct person.    Patient was in home and writer was in office.   Signed,   Lindell Noe, MHA, BS, LPN Health Coach

## 2019-01-19 ENCOUNTER — Encounter: Payer: Medicare HMO | Admitting: Family Medicine

## 2019-01-19 LAB — HEPATITIS C ANTIBODY
Hepatitis C Ab: NONREACTIVE
SIGNAL TO CUT-OFF: 0 (ref <1.00)

## 2019-01-20 ENCOUNTER — Encounter: Payer: Self-pay | Admitting: Family Medicine

## 2019-01-20 ENCOUNTER — Ambulatory Visit (INDEPENDENT_AMBULATORY_CARE_PROVIDER_SITE_OTHER): Payer: Medicare HMO | Admitting: Family Medicine

## 2019-01-20 VITALS — HR 75 | Wt 155.0 lb

## 2019-01-20 DIAGNOSIS — M8580 Other specified disorders of bone density and structure, unspecified site: Secondary | ICD-10-CM

## 2019-01-20 DIAGNOSIS — K219 Gastro-esophageal reflux disease without esophagitis: Secondary | ICD-10-CM | POA: Diagnosis not present

## 2019-01-20 DIAGNOSIS — D869 Sarcoidosis, unspecified: Secondary | ICD-10-CM

## 2019-01-20 DIAGNOSIS — E7800 Pure hypercholesterolemia, unspecified: Secondary | ICD-10-CM

## 2019-01-20 NOTE — Patient Instructions (Addendum)
Don't forget to schedule your mammogram  Work on healthy diet and exercise  For cholesterol Avoid red meat/ fried foods/ egg yolks/ fatty breakfast meats/ butter, cheese and high fat dairy/ and shellfish   Keep exercising

## 2019-01-20 NOTE — Progress Notes (Signed)
Virtual Visit via Video Note  I connected with Judith [name] on 01/20/19 at  3:15 PM EDT by a video enabled telemedicine application and verified that I am speaking with the correct person using two identifiers.  Location: Patient: home Provider: office    I discussed the limitations of evaluation and management by telemedicine and the availability of in person appointments. The patient expressed understanding and agreed to proceed.  History of Present Illness: Pt presents for annual f/u of chronic health problems   Sarcoidosis  Has a PET scan next week at [facility] for her heart - ? If inflamed from sarcoid Stress echo- was ok   Using cpap at night - tracking 02 level  occ it drops down during the day-not enough for 02  A little sob on exertion    Wt Readings from Last 3 Encounters:  01/18/19 155 lb (70.3 kg)  11/17/18 157 lb (71.2 kg)  11/02/18 159 lb (72.1 kg)  155 lb on scale this am  Exercise is a bit limited  Is able to do a 1/2  mile walk  (back problems bother her also) Trochanteric bursitis is bothering her / she needs to f/u with Dr Lorelei Pont  Had amw on 5/20 No gaps or concerns  Takes methocarbamol- for her back- helps a lot   Mammogram 6/19 - had re check on L -was ok  Self breast exam -no lumps on self exam  Rash under breasts -fungal cream or powder works  Colonoscopy 11/16  Recommended cologuard at 5 y mark  dexa 5/19  Osteopenia  No falls or fractures  Taking ca and D  Takes omeprazole for GERD It still works fairly well/ takes it every other day  Has rebound if she skips   Hyperlipidemia Lab Results  Component Value Date   CHOL 195 01/18/2019   CHOL 185 12/24/2017   CHOL 200 12/23/2016   Lab Results  Component Value Date   HDL 46.20 01/18/2019   HDL 53.20 12/24/2017   HDL 52.70 12/23/2016   Lab Results  Component Value Date   LDLCALC 128 (H) 01/18/2019   LDLCALC 109 (H) 12/24/2017   LDLCALC 119 (H) 12/23/2016   Lab Results   Component Value Date   TRIG 100.0 01/18/2019   TRIG 111.0 12/24/2017   TRIG 144.0 12/23/2016   Lab Results  Component Value Date   CHOLHDL 4 01/18/2019   CHOLHDL 3 12/24/2017   CHOLHDL 4 12/23/2016   Lab Results  Component Value Date   LDLDIRECT 154.0 03/13/2011   LDLDIRECT 134.4 12/18/2008   LDLDIRECT 148.2 11/17/2007   HDL is down a bit -less exercise  LDL is up  No fried foods  Some red meat  Some ice cream     Lab Results  Component Value Date   WBC 6.5 01/18/2019   HGB 14.7 01/18/2019   HCT 42.9 01/18/2019   MCV 92.3 01/18/2019   PLT 275.0 01/18/2019    Lab Results  Component Value Date   CREATININE 0.82 01/18/2019   BUN 25 (H) 01/18/2019   NA 140 01/18/2019   K 4.2 01/18/2019   CL 104 01/18/2019   CO2 26 01/18/2019  make effort to drink water   Lab Results  Component Value Date   ALT 22 01/18/2019   AST 17 01/18/2019   ALKPHOS 77 01/18/2019   BILITOT 0.3 01/18/2019   Lab Results  Component Value Date   TSH 2.51 01/18/2019   Hep C screen is negative  Review  of Systems  Constitutional: Negative for chills, fever, malaise/fatigue and weight loss.  HENT: Negative for hearing loss and sore throat.   Eyes: Negative for discharge and redness.  Respiratory: Positive for shortness of breath. Negative for cough and wheezing.        Baseline sob on exertion   Cardiovascular: Negative for chest pain, palpitations and leg swelling.  Gastrointestinal: Negative for abdominal pain, heartburn, nausea and vomiting.  Musculoskeletal: Negative for joint pain and myalgias.  Skin: Negative for itching and rash.  Neurological: Negative for dizziness and headaches.  Endo/Heme/Allergies: Negative for polydipsia.  Psychiatric/Behavioral: Negative for depression. The patient is not nervous/anxious.     Patient Active Problem List   Diagnosis Date Noted  . Screening mammogram, encounter for 01/07/2018  . Shortness of breath 10/25/2017  . Sinus tachycardia 10/25/2017   . Multiple lung nodules on CT 10/23/2017  . Spondylolisthesis, lumbar region 10/14/2017  . Pain in thoracic spine 10/14/2017  . Backache 10/14/2017  . Low back pain 10/06/2017  . Cough 11/16/2016  . Routine general medical examination at a health care facility 01/06/2016  . IBS (irritable colon syndrome) 05/31/2015  . Colon polyps 05/31/2015  . Trochanteric bursitis of both hips 05/31/2015  . Estrogen deficiency 12/05/2014  . Encounter for Medicare annual wellness exam 09/24/2013  . Personal history of other malignant neoplasm of skin 09/21/2013  . Enlarged lymph node 03/07/2013  . Sarcoid 08/09/2012  . Mixed incontinence urge and stress 03/16/2012  . ALOPECIA 11/14/2010  . GERD 01/24/2009  . NEOPLASM, MALIGNANT, BREAST, HX OF 01/02/2009  . Hyperlipidemia 11/17/2007  . VAGINITIS, ATROPHIC, SYMPTOMATIC 11/17/2007  . Osteopenia 11/17/2007  . TINNITUS 09/07/2007  . ALLERGIC RHINITIS 09/07/2007  . DIVERTICULOSIS, COLON 09/07/2007  . Fatty liver 09/07/2007  . History of diverticulitis 08/12/2007   Past Medical History:  Diagnosis Date  . Allergic rhinitis   . Allergy   . Arthritis   . Atrophic vaginitis   . Breast cancer ([place])    Right mastectomy  . Diverticulosis   . GERD (gastroesophageal reflux disease)   . H/O: pneumonia    as child-viral pneumonia  . Headache   . History of shingles   . HLD (hyperlipidemia)    pt denies  . IBS (irritable bowel syndrome)   . Osteopenia   . Sarcoidosis   . Sliding hiatal hernia    Past Surgical History:  Procedure Laterality Date  . APPENDECTOMY    . BASAL CELL CARCINOMA EXCISION     scalp  . BASAL CELL CARCINOMA EXCISION  08/2016   nose  . COLONOSCOPY  02/2010   divertics, rpt 5 years  . DEXA  2005   osteopenia, no change from 2003  . DILATION AND CURETTAGE OF UTERUS  10/1999  . LAPAROSCOPIC TOTAL HYSTERECTOMY  10/03   for fibroids/polyp  . MASTECTOMY Right 02/2009   right breast cancer  . POLYPECTOMY    . TOTAL  ABDOMINAL HYSTERECTOMY  2003   for fibroids/polyps  . VIDEO BRONCHOSCOPY WITH ENDOBRONCHIAL NAVIGATION N/A 02/01/2017   Procedure: VIDEO BRONCHOSCOPY WITH ENDOBRONCHIAL NAVIGATION;  Surgeon: Melrose Nakayama, MD;  Location: [facility] OR;  Service: Thoracic;  Laterality: N/A;   Social History   Tobacco Use  . Smoking status: Former Smoker    Last attempt to quit: 08/31/1968    Years since quitting: 50.4  . Smokeless tobacco: Never Used  Substance Use Topics  . Alcohol use: Not Currently    Alcohol/week: 0.0 standard drinks  . Drug use: No  Family History  Problem Relation Age of Onset  . Prostate cancer Father   . Prostate cancer Brother   . Lymphoma Brother   . Heart attack Paternal Grandmother   . Stomach cancer Maternal Grandfather   . Colon cancer Neg Hx   . Rectal cancer Neg Hx   . Esophageal cancer Neg Hx    Allergies  Allergen Reactions  . Niacin And Related Other (See Comments)    Caused black and blue marks all over body  . Meloxicam Rash   Current Outpatient Medications on File Prior to Visit  Medication Sig Dispense Refill  . albuterol (PROVENTIL HFA;VENTOLIN HFA) 108 (90 Base) MCG/ACT inhaler Inhale 2 puffs into the lungs every 4 (four) hours as needed for wheezing or shortness of breath. 1 Inhaler 2  . calcium-vitamin D (OSCAL WITH D) 500-200 MG-UNIT per tablet Take 1 tablet by mouth once a week.     . diclofenac sodium (VOLTAREN) 1 % GEL Apply 2 g topically 3 (three) times daily as needed. 1 Tube 11  . FIBER SELECT GUMMIES PO Take 2 tablets by mouth at bedtime.    . fish oil-omega-3 fatty acids 1000 MG capsule Take 1 g by mouth once a week.     . fluticasone (FLONASE) 50 MCG/ACT nasal spray SPRAY 2 SPRAYS INTO EACH NOSTRIL EVERY DAY (Patient taking differently: as needed. ) 48 g 0  . ibuprofen (ADVIL,MOTRIN) 200 MG tablet Take 400 mg by mouth every 8 (eight) hours as needed.     . methocarbamol (ROBAXIN) 500 MG tablet TAKE 1 TABLET BY MOUTH EVERY 8 HOURS AS NEEDED  FOR MUSCLE SPASMS. 30 tablet 5  . Multiple Vitamin (MULTIVITAMIN WITH MINERALS) TABS tablet Take 1 tablet by mouth at bedtime.    Marland Kitchen neomycin-polymyxin-dexameth (MAXITROL) 0.1 % OINT Place 1 application into both eyes See admin instructions. PRN: TWICE DAILY X1 WEEK & THEN DECREASE TO ONCE DAILY FOR X2 WEEKS    . omeprazole (PRILOSEC) 20 MG capsule Take 1 capsule (20 mg total) by mouth daily before breakfast. TAKE ONE CAPSULE BY MOUTH DAILY AS NEEDED 30 capsule 11   No current facility-administered medications on file prior to visit.     Observations/Objective: Patient appears well, in no distress Weight is baseline - overwt No facial swelling or asymmetry Normal voice-not hoarse and no slurred speech No obvious tremor or mobility impairment Moving neck and UEs normally Able to hear the call well  No cough or shortness of breath during interview  Talkative and mentally sharp with no cognitive changes No skin changes on face or neck , no rash or pallor Affect is normal    Assessment and Plan: Problem List Items Addressed This Visit      Digestive   GERD    Pt continues ppi every other day  Has not been able to cut further  Overall doing well and watches diet         Musculoskeletal and Integument   Osteopenia    dexa 5/19  Stable  No falls or fx  Continues ca and D and exercise        Other   Hyperlipidemia - Primary    Disc goals for lipids and reasons to control them Rev last labs with pt Rev low sat fat diet in detail LDL is up- poss due to more ice cream HDL down-less exercise        Sarcoid    Pt continues to struggle with hypoxia - may be 2nd  to lung involvement Also in process of w/u for cardiac sarcoid-PET scan soon  Continues to wear 02 at night  No pharmacologic tx          Follow Up Instructions: Don't forget to schedule your mammogram  Work on healthy diet and exercise  For cholesterol Avoid red meat/ fried foods/ egg yolks/ fatty breakfast  meats/ butter, cheese and high fat dairy/ and shellfish   Keep exercising    I discussed the assessment and treatment plan with the patient. The patient was provided an opportunity to ask questions and all were answered. The patient agreed with the plan and demonstrated an understanding of the instructions.   The patient was advised to call back or seek an in-person evaluation if the symptoms worsen or if the condition fails to improve as anticipated.    Loura Pardon, MD

## 2019-01-22 NOTE — Assessment & Plan Note (Signed)
Pt continues ppi every other day  Has not been able to cut further  Overall doing well and watches diet

## 2019-01-22 NOTE — Assessment & Plan Note (Signed)
Pt continues to struggle with hypoxia - may be 2nd to lung involvement Also in process of w/u for cardiac sarcoid-PET scan soon  Continues to wear 02 at night  No pharmacologic tx

## 2019-01-22 NOTE — Assessment & Plan Note (Signed)
Disc goals for lipids and reasons to control them Rev last labs with pt Rev low sat fat diet in detail LDL is up- poss due to more ice cream HDL down-less exercise

## 2019-01-22 NOTE — Assessment & Plan Note (Signed)
dexa 5/19  Stable  No falls or fx  Continues ca and D and exercise

## 2019-01-26 DIAGNOSIS — Z853 Personal history of malignant neoplasm of breast: Secondary | ICD-10-CM | POA: Diagnosis not present

## 2019-01-26 DIAGNOSIS — D869 Sarcoidosis, unspecified: Secondary | ICD-10-CM | POA: Diagnosis not present

## 2019-01-26 DIAGNOSIS — R0602 Shortness of breath: Secondary | ICD-10-CM | POA: Diagnosis not present

## 2019-01-26 DIAGNOSIS — R918 Other nonspecific abnormal finding of lung field: Secondary | ICD-10-CM | POA: Diagnosis not present

## 2019-01-26 DIAGNOSIS — D860 Sarcoidosis of lung: Secondary | ICD-10-CM | POA: Diagnosis not present

## 2019-01-30 DIAGNOSIS — D8685 Sarcoid myocarditis: Secondary | ICD-10-CM | POA: Diagnosis not present

## 2019-02-02 DIAGNOSIS — D869 Sarcoidosis, unspecified: Secondary | ICD-10-CM | POA: Diagnosis not present

## 2019-02-02 DIAGNOSIS — D8685 Sarcoid myocarditis: Secondary | ICD-10-CM | POA: Diagnosis not present

## 2019-02-04 ENCOUNTER — Other Ambulatory Visit: Payer: Self-pay | Admitting: Family Medicine

## 2019-02-06 DIAGNOSIS — Z01818 Encounter for other preprocedural examination: Secondary | ICD-10-CM | POA: Diagnosis not present

## 2019-02-06 DIAGNOSIS — Z1159 Encounter for screening for other viral diseases: Secondary | ICD-10-CM | POA: Diagnosis not present

## 2019-02-06 DIAGNOSIS — R590 Localized enlarged lymph nodes: Secondary | ICD-10-CM | POA: Diagnosis not present

## 2019-02-07 ENCOUNTER — Encounter: Payer: Self-pay | Admitting: Family Medicine

## 2019-02-07 ENCOUNTER — Ambulatory Visit (INDEPENDENT_AMBULATORY_CARE_PROVIDER_SITE_OTHER): Payer: Medicare HMO | Admitting: Family Medicine

## 2019-02-07 ENCOUNTER — Other Ambulatory Visit: Payer: Self-pay

## 2019-02-07 VITALS — BP 100/70 | HR 84 | Temp 98.6°F | Ht 60.5 in | Wt 153.2 lb

## 2019-02-07 DIAGNOSIS — W57XXXA Bitten or stung by nonvenomous insect and other nonvenomous arthropods, initial encounter: Secondary | ICD-10-CM | POA: Insufficient documentation

## 2019-02-07 DIAGNOSIS — S30860A Insect bite (nonvenomous) of lower back and pelvis, initial encounter: Secondary | ICD-10-CM | POA: Diagnosis not present

## 2019-02-07 DIAGNOSIS — D869 Sarcoidosis, unspecified: Secondary | ICD-10-CM

## 2019-02-07 MED ORDER — DOXYCYCLINE HYCLATE 100 MG PO TABS: 100.0000 mg | ORAL_TABLET | Freq: Two times a day (BID) | ORAL | 0 refills | Status: DC

## 2019-02-07 NOTE — Patient Instructions (Signed)
You have a local reaction to a tick bite.  Given the appearance of the site I want to treat you with doxycycline Please watch for a rash on arms /legs or trunk   Keep the bite site clean with soap and water  An over the counter cortisone cream is fine for itch and inflammation  A cold compress also helps

## 2019-02-07 NOTE — Assessment & Plan Note (Signed)
Tick removed entirely today (still alive and moving and not engorged)  Bite area looks inflamed with some features of erythema migrans-so treated with doxycycline No s/s of tick bourne illness like fever/rash/joint pain   Update if not starting to improve in a week or if worsening    She is currently being eval for sarcoid and has a bronchoscopy tomorrow

## 2019-02-07 NOTE — Assessment & Plan Note (Signed)
Began with skin symptoms- now findings in heart and other areas per PET scan For bronchoscopy tomorrow to sample LN  Pend result  Pt feels generally tired

## 2019-02-07 NOTE — Progress Notes (Signed)
Subjective:    Patient ID: Judith [name], female    DOB: [birth year], 73 y.o.   MRN: [id number]  HPI Pt presents with tick bite on back   Red and irritated Tick was still attached  Removed in office today  Looks like a wood tick or a dog tick   Area is burning and itching  No rash anywhere  No fever  Had a headache this am  No ST  Does not feel sick   Has a bronchoscopy -scheduled for tomrrow Her PET scan showed inflammation in multiple areas (sarcoid)    Found a tick on dog's ear - smaller  Suspects she may have gotten the tick from her long haired dog since she herself is not outdoors much  Patient Active Problem List   Diagnosis Date Noted  . Tick bite of back 02/07/2019  . Screening mammogram, encounter for 01/07/2018  . Shortness of breath 10/25/2017  . Sinus tachycardia 10/25/2017  . Multiple lung nodules on CT 10/23/2017  . Spondylolisthesis, lumbar region 10/14/2017  . Pain in thoracic spine 10/14/2017  . Backache 10/14/2017  . Low back pain 10/06/2017  . Cough 11/16/2016  . Routine general medical examination at a health care facility 01/06/2016  . IBS (irritable colon syndrome) 05/31/2015  . Colon polyps 05/31/2015  . Trochanteric bursitis of both hips 05/31/2015  . Estrogen deficiency 12/05/2014  . Encounter for Medicare annual wellness exam 09/24/2013  . Personal history of other malignant neoplasm of skin 09/21/2013  . Enlarged lymph node 03/07/2013  . Sarcoid 08/09/2012  . Mixed incontinence urge and stress 03/16/2012  . ALOPECIA 11/14/2010  . GERD 01/24/2009  . NEOPLASM, MALIGNANT, BREAST, HX OF 01/02/2009  . Hyperlipidemia 11/17/2007  . VAGINITIS, ATROPHIC, SYMPTOMATIC 11/17/2007  . Osteopenia 11/17/2007  . TINNITUS 09/07/2007  . ALLERGIC RHINITIS 09/07/2007  . DIVERTICULOSIS, COLON 09/07/2007  . Fatty liver 09/07/2007  . History of diverticulitis 08/12/2007   Past Medical History:  Diagnosis Date  . Allergic rhinitis   . Allergy    . Arthritis   . Atrophic vaginitis   . Breast cancer ([place])    Right mastectomy  . Diverticulosis   . GERD (gastroesophageal reflux disease)   . H/O: pneumonia    as child-viral pneumonia  . Headache   . History of shingles   . HLD (hyperlipidemia)    pt denies  . IBS (irritable bowel syndrome)   . Osteopenia   . Sarcoidosis   . Sliding hiatal hernia    Past Surgical History:  Procedure Laterality Date  . APPENDECTOMY    . BASAL CELL CARCINOMA EXCISION     scalp  . BASAL CELL CARCINOMA EXCISION  08/2016   nose  . COLONOSCOPY  02/2010   divertics, rpt 5 years  . DEXA  2005   osteopenia, no change from 2003  . DILATION AND CURETTAGE OF UTERUS  10/1999  . LAPAROSCOPIC TOTAL HYSTERECTOMY  10/03   for fibroids/polyp  . MASTECTOMY Right 02/2009   right breast cancer  . POLYPECTOMY    . TOTAL ABDOMINAL HYSTERECTOMY  2003   for fibroids/polyps  . VIDEO BRONCHOSCOPY WITH ENDOBRONCHIAL NAVIGATION N/A 02/01/2017   Procedure: VIDEO BRONCHOSCOPY WITH ENDOBRONCHIAL NAVIGATION;  Surgeon: Melrose Nakayama, MD;  Location[facility] OR;  Service: Thoracic;  Laterality: N/A;   Social History   Tobacco Use  . Smoking status: Former Smoker    Last attempt to quit: 08/31/1968    Years since quitting: 50.4  . Smokeless tobacco:  Never Used  Substance Use Topics  . Alcohol use: Not Currently    Alcohol/week: 0.0 standard drinks  . Drug use: No   Family History  Problem Relation Age of Onset  . Prostate cancer Father   . Prostate cancer Brother   . Lymphoma Brother   . Heart attack Paternal Grandmother   . Stomach cancer Maternal Grandfather   . Colon cancer Neg Hx   . Rectal cancer Neg Hx   . Esophageal cancer Neg Hx    Allergies  Allergen Reactions  . Niacin And Related Other (See Comments)    Caused black and blue marks all over body  . Meloxicam Rash   Current Outpatient Medications on File Prior to Visit  Medication Sig Dispense Refill  . albuterol (PROVENTIL HFA;VENTOLIN  HFA) 108 (90 Base) MCG/ACT inhaler Inhale 2 puffs into the lungs every 4 (four) hours as needed for wheezing or shortness of breath. 1 Inhaler 2  . calcium-vitamin D (OSCAL WITH D) 500-200 MG-UNIT per tablet Take 1 tablet by mouth once a week.     . diclofenac sodium (VOLTAREN) 1 % GEL Apply 2 g topically 3 (three) times daily as needed. 1 Tube 11  . FIBER SELECT GUMMIES PO Take 2 tablets by mouth at bedtime.    . fish oil-omega-3 fatty acids 1000 MG capsule Take 1 g by mouth once a week.     . fluticasone (FLONASE) 50 MCG/ACT nasal spray SPRAY 2 SPRAYS INTO EACH NOSTRIL EVERY DAY (Patient taking differently: as needed. ) 48 g 0  . ibuprofen (ADVIL,MOTRIN) 200 MG tablet Take 400 mg by mouth every 8 (eight) hours as needed.     . methocarbamol (ROBAXIN) 500 MG tablet TAKE 1 TABLET BY MOUTH EVERY 8 HOURS AS NEEDED FOR MUSCLE SPASMS. 30 tablet 5  . Multiple Vitamin (MULTIVITAMIN WITH MINERALS) TABS tablet Take 1 tablet by mouth at bedtime.    Marland Kitchen neomycin-polymyxin-dexameth (MAXITROL) 0.1 % OINT Place 1 application into both eyes See admin instructions. PRN: TWICE DAILY X1 WEEK & THEN DECREASE TO ONCE DAILY FOR X2 WEEKS    . omeprazole (PRILOSEC) 20 MG capsule TAKE 1 CAPSULE (20 MG TOTAL) BY MOUTH DAILY BEFORE BREAKFAST AS NEEDED 30 capsule 11   No current facility-administered medications on file prior to visit.     Review of Systems  Constitutional: Positive for fatigue. Negative for activity change, appetite change, fever and unexpected weight change.  HENT: Negative for congestion, ear pain, rhinorrhea, sinus pressure and sore throat.   Eyes: Negative for pain, redness and visual disturbance.  Respiratory: Negative for cough, shortness of breath and wheezing.   Cardiovascular: Negative for chest pain and palpitations.  Gastrointestinal: Negative for abdominal pain, blood in stool, constipation and diarrhea.  Endocrine: Negative for polydipsia and polyuria.  Genitourinary: Negative for dysuria,  frequency and urgency.  Musculoskeletal: Negative for arthralgias, back pain and myalgias.  Skin: Negative for pallor and rash.       Tick bite R upper back itchy  Allergic/Immunologic: Negative for environmental allergies.  Neurological: Negative for dizziness, syncope and headaches.  Hematological: Negative for adenopathy. Does not bruise/bleed easily.  Psychiatric/Behavioral: Negative for decreased concentration and dysphoric mood. The patient is not nervous/anxious.        Objective:   Physical Exam Constitutional:      General: She is not in acute distress.    Appearance: Normal appearance. She is normal weight. She is not ill-appearing.  HENT:     Head: Normocephalic and  atraumatic.     Mouth/Throat:     Mouth: Mucous membranes are moist.     Pharynx: Oropharynx is clear.  Eyes:     Extraocular Movements: Extraocular movements intact.     Conjunctiva/sclera: Conjunctivae normal.     Pupils: Pupils are equal, round, and reactive to light.  Neck:     Musculoskeletal: Normal range of motion.  Cardiovascular:     Rate and Rhythm: Normal rate and regular rhythm.  Pulmonary:     Effort: No respiratory distress.     Breath sounds: No wheezing, rhonchi or rales.  Lymphadenopathy:     Cervical: No cervical adenopathy.  Skin:    General: Skin is warm and dry.     Findings: Erythema present. No rash.     Comments: Oval area of erythema and induration on back (upper right) with bruised area in the middle and one pale stripe within the oval  Few excoriations   Entire tick was easily removed/ not engorged and still alive   (appears to be a dog or wood tick)   No rashes    Neurological:     Mental Status: She is alert. Mental status is at baseline.  Psychiatric:        Mood and Affect: Mood normal.           Assessment & Plan:   Problem List Items Addressed This Visit      Musculoskeletal and Integument   Tick bite of back - Primary    Tick removed entirely today  (still alive and moving and not engorged)  Bite area looks inflamed with some features of erythema migrans-so treated with doxycycline No s/s of tick bourne illness like fever/rash/joint pain   Update if not starting to improve in a week or if worsening    She is currently being eval for sarcoid and has a bronchoscopy tomorrow           Other   Sarcoid    Began with skin symptoms- now findings in heart and other areas per PET scan For bronchoscopy tomorrow to sample LN  Pend result  Pt feels generally tired

## 2019-02-08 DIAGNOSIS — R918 Other nonspecific abnormal finding of lung field: Secondary | ICD-10-CM | POA: Diagnosis not present

## 2019-02-08 DIAGNOSIS — G4730 Sleep apnea, unspecified: Secondary | ICD-10-CM | POA: Diagnosis not present

## 2019-02-08 DIAGNOSIS — R9389 Abnormal findings on diagnostic imaging of other specified body structures: Secondary | ICD-10-CM | POA: Diagnosis not present

## 2019-02-08 DIAGNOSIS — E785 Hyperlipidemia, unspecified: Secondary | ICD-10-CM | POA: Diagnosis not present

## 2019-02-08 DIAGNOSIS — E669 Obesity, unspecified: Secondary | ICD-10-CM | POA: Diagnosis not present

## 2019-02-08 DIAGNOSIS — Z90710 Acquired absence of both cervix and uterus: Secondary | ICD-10-CM | POA: Diagnosis not present

## 2019-02-08 DIAGNOSIS — Z9981 Dependence on supplemental oxygen: Secondary | ICD-10-CM | POA: Diagnosis not present

## 2019-02-08 DIAGNOSIS — R591 Generalized enlarged lymph nodes: Secondary | ICD-10-CM | POA: Diagnosis not present

## 2019-02-08 DIAGNOSIS — Z9911 Dependence on respirator [ventilator] status: Secondary | ICD-10-CM | POA: Diagnosis not present

## 2019-02-08 DIAGNOSIS — Z87891 Personal history of nicotine dependence: Secondary | ICD-10-CM | POA: Diagnosis not present

## 2019-02-08 DIAGNOSIS — R590 Localized enlarged lymph nodes: Secondary | ICD-10-CM | POA: Diagnosis not present

## 2019-02-08 DIAGNOSIS — Z853 Personal history of malignant neoplasm of breast: Secondary | ICD-10-CM | POA: Diagnosis not present

## 2019-02-08 DIAGNOSIS — R0989 Other specified symptoms and signs involving the circulatory and respiratory systems: Secondary | ICD-10-CM | POA: Diagnosis not present

## 2019-02-08 DIAGNOSIS — K219 Gastro-esophageal reflux disease without esophagitis: Secondary | ICD-10-CM | POA: Diagnosis not present

## 2019-02-15 DIAGNOSIS — G4733 Obstructive sleep apnea (adult) (pediatric): Secondary | ICD-10-CM | POA: Diagnosis not present

## 2019-03-04 DIAGNOSIS — D869 Sarcoidosis, unspecified: Secondary | ICD-10-CM | POA: Diagnosis not present

## 2019-03-14 DIAGNOSIS — D8685 Sarcoid myocarditis: Secondary | ICD-10-CM | POA: Diagnosis not present

## 2019-03-17 DIAGNOSIS — G4733 Obstructive sleep apnea (adult) (pediatric): Secondary | ICD-10-CM | POA: Diagnosis not present

## 2019-03-17 DIAGNOSIS — K7689 Other specified diseases of liver: Secondary | ICD-10-CM | POA: Diagnosis not present

## 2019-03-17 DIAGNOSIS — D869 Sarcoidosis, unspecified: Secondary | ICD-10-CM | POA: Diagnosis not present

## 2019-03-17 DIAGNOSIS — D8685 Sarcoid myocarditis: Secondary | ICD-10-CM | POA: Diagnosis not present

## 2019-03-19 ENCOUNTER — Other Ambulatory Visit: Payer: Self-pay | Admitting: Family Medicine

## 2019-03-29 DIAGNOSIS — G4733 Obstructive sleep apnea (adult) (pediatric): Secondary | ICD-10-CM | POA: Diagnosis not present

## 2019-03-30 DIAGNOSIS — K760 Fatty (change of) liver, not elsewhere classified: Secondary | ICD-10-CM | POA: Diagnosis not present

## 2019-03-30 DIAGNOSIS — K7689 Other specified diseases of liver: Secondary | ICD-10-CM | POA: Diagnosis not present

## 2019-04-04 DIAGNOSIS — D869 Sarcoidosis, unspecified: Secondary | ICD-10-CM | POA: Diagnosis not present

## 2019-04-11 DIAGNOSIS — K769 Liver disease, unspecified: Secondary | ICD-10-CM | POA: Diagnosis not present

## 2019-04-12 DIAGNOSIS — D2272 Melanocytic nevi of left lower limb, including hip: Secondary | ICD-10-CM | POA: Diagnosis not present

## 2019-04-12 DIAGNOSIS — C4359 Malignant melanoma of other part of trunk: Secondary | ICD-10-CM | POA: Diagnosis not present

## 2019-04-12 DIAGNOSIS — D2271 Melanocytic nevi of right lower limb, including hip: Secondary | ICD-10-CM | POA: Diagnosis not present

## 2019-04-12 DIAGNOSIS — D2262 Melanocytic nevi of left upper limb, including shoulder: Secondary | ICD-10-CM | POA: Diagnosis not present

## 2019-04-12 DIAGNOSIS — D225 Melanocytic nevi of trunk: Secondary | ICD-10-CM | POA: Diagnosis not present

## 2019-04-12 DIAGNOSIS — C44519 Basal cell carcinoma of skin of other part of trunk: Secondary | ICD-10-CM | POA: Diagnosis not present

## 2019-04-12 DIAGNOSIS — D485 Neoplasm of uncertain behavior of skin: Secondary | ICD-10-CM | POA: Diagnosis not present

## 2019-04-12 DIAGNOSIS — D2261 Melanocytic nevi of right upper limb, including shoulder: Secondary | ICD-10-CM | POA: Diagnosis not present

## 2019-04-17 DIAGNOSIS — G4733 Obstructive sleep apnea (adult) (pediatric): Secondary | ICD-10-CM | POA: Diagnosis not present

## 2019-05-05 DIAGNOSIS — D869 Sarcoidosis, unspecified: Secondary | ICD-10-CM | POA: Diagnosis not present

## 2019-05-10 DIAGNOSIS — C4359 Malignant melanoma of other part of trunk: Secondary | ICD-10-CM | POA: Diagnosis not present

## 2019-05-10 DIAGNOSIS — D0359 Melanoma in situ of other part of trunk: Secondary | ICD-10-CM | POA: Diagnosis not present

## 2019-05-11 ENCOUNTER — Encounter: Payer: Self-pay | Admitting: Internal Medicine

## 2019-05-11 ENCOUNTER — Ambulatory Visit: Payer: Medicare HMO | Admitting: Internal Medicine

## 2019-05-11 ENCOUNTER — Other Ambulatory Visit: Payer: Self-pay

## 2019-05-11 VITALS — BP 126/78 | Temp 97.8°F | Ht 60.5 in | Wt 154.0 lb

## 2019-05-11 DIAGNOSIS — J9611 Chronic respiratory failure with hypoxia: Secondary | ICD-10-CM

## 2019-05-11 DIAGNOSIS — D8685 Sarcoid myocarditis: Secondary | ICD-10-CM | POA: Diagnosis not present

## 2019-05-11 DIAGNOSIS — D869 Sarcoidosis, unspecified: Secondary | ICD-10-CM

## 2019-05-11 DIAGNOSIS — G4733 Obstructive sleep apnea (adult) (pediatric): Secondary | ICD-10-CM | POA: Diagnosis not present

## 2019-05-11 NOTE — Patient Instructions (Signed)
Continue immunotherapy for cardiac and pulmonary sarcoid Follow up [facility] cardiology  Continue AUTOCPAP Decrease pressure 5-12 cm h20 Increase Oxygen to 3L [place] at night

## 2019-05-11 NOTE — Progress Notes (Signed)
Name: Judith [name] MRN: [id number] DOB: [birth year]     CONSULTATION DATE:05/11/2019  REFERRING MD :  Dr. Roxan Hockey  CHIEF COMPLAINT:   Follow-up sarcoid  STUDIES:  CT chest Jan 26, 2017 I have Independently reviewed images of  CT chest    Interpretation:B/l pulm nodules  Previous history 73 year old pleasant white female seen today for abnormal CT chest with bilateral pulmonary nodules Patient has a diagnosis and history of sarcoidosis of the scalp In February 2018 patient developed a chronic cough patient was worked up for chest x-ray and a CT scan of the chest which revealed several pulmonary nodules Patient was then referred to cardiothoracic surgery for further evaluation Dr. 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 Patient sees ophthalmology on an annual basis to assess for sarcoid  At this time patient refuses Plaquenil therapy Patient also refuses immunotherapy  6MWT test WNL ONO revelas signs hypoxia Placed on 2 L [place] oxygen therapy  patient diagnosed with sleep apnea with AHI of 13 123 events Lowest O2 sat was 75%  CC Follow-up sleep apnea Follow-up sarcoid Follow-up hypoxia   HPI 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 at this time Patient extensive cardiac work-up at [facility] at consistent with cardiac sarcoid PET scan shows lymphadenopathy  Patient currently on 2 L nasal cannula at  nighttime for hypoxia Also has auto CPAP therapy for sleep apnea  Her sleep apnea is well controlled patient uses and benefits from auto CPAP therapy her AHI is less than 1 Patient has excellent compliance report Patient reports thick excess leak she is currently on 5 to 15 cm of water pressure   Nocturnal hypoxia treated with 2 L nasal cannula at nighttime however her Fitbit watch shows that she has intermittent dips in her oxygen level sats to 80s and I recommended to increase her oxygen intake to 3 L nasal cannula  CT chest reviewed with patient January 2020 Stable interstitial lung opacifications moderate Unchanged since May 2018  Cardiac sarcoidosis diagnosed at [facility] Patient is on chronic prednisone therapy at this time for the next 6 months and will transition to methotrexate therapy Patient will need prolonged immunotherapy indefinitely  No signs of infection at this time     Allergies  Allergen Reactions  . Niacin And Related Other (See Comments)    Caused black and blue marks all over body  . Meloxicam Rash       Review of Systems:  Gen:  Denies  fever, sweats, chills weight loss  HEENT: Denies blurred vision, double vision, ear pain, eye pain, hearing loss, nose bleeds, sore throat Cardiac:  No dizziness, chest pain or heaviness, chest tightness,edema, No JVD Resp:   No cough, -sputum production, +shortness of breath,-wheezing, -hemoptysis,  Gi: Denies swallowing difficulty, stomach pain, nausea or vomiting, diarrhea, constipation, bowel incontinence Gu:  Denies bladder incontinence, burning urine Ext:   Denies Joint pain, stiffness or swelling Skin: Denies  skin rash, easy bruising or bleeding or hives Endoc:  Denies polyuria, polydipsia , polyphagia or weight change Psych:   Denies depression, insomnia or hallucinations  Other:  All other systems negative   BP 126/78   Temp 97.8 F (36.6 C) (Temporal)   Ht 5' 0.5" (1.537 m)   Wt 154 lb (69.9  kg)   SpO2 97%   BMI 29.58 kg/m    Physical Examination:   GENERAL:NAD, no fevers, chills, no weakness no fatigue HEAD: Normocephalic, atraumatic.  EYES: PERLA, EOMI No scleral icterus.  MOUTH: Moist mucosal membrane.  EAR, NOSE, THROAT: Clear without exudates. No external lesions.  NECK: Supple. No thyromegaly.  No JVD.  PULMONARY: CTA B/L no wheezing, rhonchi, crackles CARDIOVASCULAR: S1 and S2. Regular rate and rhythm. No murmurs GASTROINTESTINAL: Soft, nontender, nondistended. Positive bowel sounds.  MUSCULOSKELETAL: No swelling, clubbing, or edema.  NEUROLOGIC: No gross focal neurological deficits. 5/5 strength all extremities SKIN: No ulceration, lesions, rashes, or cyanosis.  PSYCHIATRIC: Insight, judgment intact. 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  Cardiac sarcoid Patient on chronic immunosuppressive therapy with prednisone and will transition to methotrexate Patient is followed at [facility] cardiology  Sarcoid stage IV Continue oxygen therapy as prescribed Continue immunosuppressive therapy as prescribed  OSA moderate Continue auto CPAP but will decrease amount of pressure due to increased leaks Will change to 5 - 12 cm of water pressure Patient uses nasal pillows Patient uses and benefits from auto CPAP therapy  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  The importance of social distancing was discussed  today. Hand Washing Techniques and avoid touching face was advised.  MEDICATION ADJUSTMENTS/LABS AND TESTS ORDERED: Continue immunosuppressive therapy Continue auto CPAP decrease 5 to 12 cm of pressure Continue oxygen therapy as prescribed but increase to 3 L   CURRENT MEDICATIONS REVIEWED AT LENGTH WITH PATIENT TODAY   Patient satisfied with Plan of action and management. All questions answered  Follow up in 6 months   Jamillah Camilo Patricia Pesa, M.D.  [facility]  Medical Director [place] Director [facility] Cardio-Pulmonary Department

## 2019-05-12 DIAGNOSIS — R911 Solitary pulmonary nodule: Secondary | ICD-10-CM | POA: Diagnosis not present

## 2019-05-12 DIAGNOSIS — R9389 Abnormal findings on diagnostic imaging of other specified body structures: Secondary | ICD-10-CM | POA: Diagnosis not present

## 2019-05-12 DIAGNOSIS — Z23 Encounter for immunization: Secondary | ICD-10-CM | POA: Diagnosis not present

## 2019-05-12 DIAGNOSIS — D869 Sarcoidosis, unspecified: Secondary | ICD-10-CM | POA: Diagnosis not present

## 2019-05-18 DIAGNOSIS — G4733 Obstructive sleep apnea (adult) (pediatric): Secondary | ICD-10-CM | POA: Diagnosis not present

## 2019-05-24 DIAGNOSIS — C44519 Basal cell carcinoma of skin of other part of trunk: Secondary | ICD-10-CM | POA: Diagnosis not present

## 2019-05-29 DIAGNOSIS — H25043 Posterior subcapsular polar age-related cataract, bilateral: Secondary | ICD-10-CM | POA: Diagnosis not present

## 2019-06-04 DIAGNOSIS — D869 Sarcoidosis, unspecified: Secondary | ICD-10-CM | POA: Diagnosis not present

## 2019-06-13 ENCOUNTER — Encounter: Payer: Self-pay | Admitting: Family Medicine

## 2019-06-13 ENCOUNTER — Ambulatory Visit (INDEPENDENT_AMBULATORY_CARE_PROVIDER_SITE_OTHER): Payer: Medicare HMO | Admitting: Family Medicine

## 2019-06-13 ENCOUNTER — Other Ambulatory Visit: Payer: Self-pay

## 2019-06-13 VITALS — BP 122/76 | HR 77 | Temp 97.1°F | Ht 60.5 in | Wt 158.6 lb

## 2019-06-13 DIAGNOSIS — K5792 Diverticulitis of intestine, part unspecified, without perforation or abscess without bleeding: Secondary | ICD-10-CM | POA: Diagnosis not present

## 2019-06-13 DIAGNOSIS — R1030 Lower abdominal pain, unspecified: Secondary | ICD-10-CM | POA: Diagnosis not present

## 2019-06-13 LAB — POC URINALSYSI DIPSTICK (AUTOMATED)
Bilirubin, UA: NEGATIVE
Blood, UA: NEGATIVE
Glucose, UA: NEGATIVE
Ketones, UA: NEGATIVE
Leukocytes, UA: NEGATIVE
Nitrite, UA: NEGATIVE
Protein, UA: NEGATIVE
Spec Grav, UA: 1.025 (ref 1.010–1.025)
Urobilinogen, UA: 0.2 E.U./dL
pH, UA: 6 (ref 5.0–8.0)

## 2019-06-13 MED ORDER — AMOXICILLIN-POT CLAVULANATE 875-125 MG PO TABS: 1.0000 | ORAL_TABLET | Freq: Two times a day (BID) | ORAL | 0 refills | Status: DC

## 2019-06-13 NOTE — Assessment & Plan Note (Signed)
Early/mild/acute in pt taking chronic prednisone  Cover with augmentin bid 7d -will extend course if needed  Enc fluids- eat light and advance gradually  Avoid nuts/seeds/corn  inst to call if worse pain or any new symptoms GI or other (she is familiar and has had it before)  Consider CT if no imp Update if not starting to improve in a week or if worsening

## 2019-06-13 NOTE — Patient Instructions (Signed)
Take the augmentin as directed for suspected early diverticulitis  Take it with food Take your probiotic   Watch for increase in pain or any fever or nausea or stool change or blood in stool  augmentin may loosen stool more   Update if not starting to improve in a week or if worsening     Avoid nuts/seeds/corn and popcorn

## 2019-06-13 NOTE — Progress Notes (Signed)
Subjective:    Patient ID: Judith [name], female    DOB: 12-Nov-1945, 73 y.o.   MRN: [id number]  HPI Pt presents with left lower abdominal pain   Wt Readings from Last 3 Encounters:  06/13/19 158 lb 9 oz (71.9 kg)  05/11/19 154 lb (69.9 kg)  02/07/19 153 lb 3.2 oz (69.5 kg)   30.46 kg/m   Pain in LLQ of abdomen -= a week   (getting slightly worse)  Bothering her when she walks  Aware of it when not moving  It has kept her awake  Not severe  Is achey  A little tender to the touch  occ radiates to upper L thigh   No difference with eating   No bowel habit changes and no blood to stool  No urinary symptoms   Is going out of town soon -wants to make sure she is ok  Clear UA  Results for orders placed or performed in visit on 06/13/19  POCT Urinalysis Dipstick (Automated)  Result Value Ref Range   Color, UA Yellow    Clarity, UA Clear    Glucose, UA Negative Negative   Bilirubin, UA Negative    Ketones, UA Negative    Spec Grav, UA 1.025 1.010 - 1.025   Blood, UA Negative    pH, UA 6.0 5.0 - 8.0   Protein, UA Negative Negative   Urobilinogen, UA 0.2 0.2 or 1.0 E.U./dL   Nitrite, UA Negative    Leukocytes, UA Negative Negative     H/o diverticulosis   On prednisone for sarcoid - just dropped down to 15 (was on high dose since June)  Was recently on bactrim - just finished (takes it when she is below 20 mg on pred)  Past hx of total hysterectomy and appy Rev last colonoscopy-one polyp and some tics   Patient Active Problem List   Diagnosis Date Noted  . Diverticulitis 06/13/2019  . Tick bite of back 02/07/2019  . Screening mammogram, encounter for 01/07/2018  . Shortness of breath 10/25/2017  . Sinus tachycardia 10/25/2017  . Multiple lung nodules on CT 10/23/2017  . Spondylolisthesis, lumbar region 10/14/2017  . Pain in thoracic spine 10/14/2017  . Backache 10/14/2017  . Low back pain 10/06/2017  . Cough 11/16/2016  . Routine general medical  examination at a health care facility 01/06/2016  . IBS (irritable colon syndrome) 05/31/2015  . Colon polyps 05/31/2015  . Trochanteric bursitis of both hips 05/31/2015  . Estrogen deficiency 12/05/2014  . Encounter for Medicare annual wellness exam 09/24/2013  . Personal history of other malignant neoplasm of skin 09/21/2013  . Enlarged lymph node 03/07/2013  . Sarcoid 08/09/2012  . Mixed incontinence urge and stress 03/16/2012  . ALOPECIA 11/14/2010  . GERD 01/24/2009  . NEOPLASM, MALIGNANT, BREAST, HX OF 01/02/2009  . Hyperlipidemia 11/17/2007  . VAGINITIS, ATROPHIC, SYMPTOMATIC 11/17/2007  . Osteopenia 11/17/2007  . TINNITUS 09/07/2007  . ALLERGIC RHINITIS 09/07/2007  . DIVERTICULOSIS, COLON 09/07/2007  . Fatty liver 09/07/2007  . History of diverticulitis 08/12/2007   Past Medical History:  Diagnosis Date  . Allergic rhinitis   . Allergy   . Arthritis   . Atrophic vaginitis   . Breast cancer ([place])    Right mastectomy  . Diverticulosis   . GERD (gastroesophageal reflux disease)   . H/O: pneumonia    as child-viral pneumonia  . Headache   . History of shingles   . HLD (hyperlipidemia)    pt denies  .  IBS (irritable bowel syndrome)   . Osteopenia   . Sarcoidosis   . Sliding hiatal hernia    Past Surgical History:  Procedure Laterality Date  . APPENDECTOMY    . BASAL CELL CARCINOMA EXCISION     scalp  . BASAL CELL CARCINOMA EXCISION  08/2016   nose  . COLONOSCOPY  02/2010   divertics, rpt 5 years  . DEXA  2005   osteopenia, no change from 2003  . DILATION AND CURETTAGE OF UTERUS  10/1999  . LAPAROSCOPIC TOTAL HYSTERECTOMY  10/03   for fibroids/polyp  . MASTECTOMY Right 02/2009   right breast cancer  . POLYPECTOMY    . TOTAL ABDOMINAL HYSTERECTOMY  2003   for fibroids/polyps  . VIDEO BRONCHOSCOPY WITH ENDOBRONCHIAL NAVIGATION N/A 02/01/2017   Procedure: VIDEO BRONCHOSCOPY WITH ENDOBRONCHIAL NAVIGATION;  Surgeon: Melrose Nakayama, MD;  Location: [facility] OR;   Service: Thoracic;  Laterality: N/A;   Social History   Tobacco Use  . Smoking status: Former Smoker    Quit date: 08/31/1968    Years since quitting: 50.8  . Smokeless tobacco: Never Used  Substance Use Topics  . Alcohol use: Not Currently    Alcohol/week: 0.0 standard drinks  . Drug use: No   Family History  Problem Relation Age of Onset  . Prostate cancer Father   . Prostate cancer Brother   . Lymphoma Brother   . Heart attack Paternal Grandmother   . Stomach cancer Maternal Grandfather   . Colon cancer Neg Hx   . Rectal cancer Neg Hx   . Esophageal cancer Neg Hx    Allergies  Allergen Reactions  . Niacin And Related Other (See Comments)    Caused black and blue marks all over body  . Meloxicam Rash   Current Outpatient Medications on File Prior to Visit  Medication Sig Dispense Refill  . albuterol (PROVENTIL HFA;VENTOLIN HFA) 108 (90 Base) MCG/ACT inhaler Inhale 2 puffs into the lungs every 4 (four) hours as needed for wheezing or shortness of breath. 1 Inhaler 2  . calcium-vitamin D (OSCAL WITH D) 500-200 MG-UNIT per tablet Take 1 tablet by mouth once a week.     . diclofenac sodium (VOLTAREN) 1 % GEL Apply 2 g topically 3 (three) times daily as needed. 1 Tube 11  . FIBER SELECT GUMMIES PO Take 2 tablets by mouth at bedtime.    . fish oil-omega-3 fatty acids 1000 MG capsule Take 1 g by mouth once a week.     . fluticasone (FLONASE) 50 MCG/ACT nasal spray Place 2 sprays into both nostrils daily as needed. 48 mL 2  . ibuprofen (ADVIL,MOTRIN) 200 MG tablet Take 400 mg by mouth every 8 (eight) hours as needed.     . methocarbamol (ROBAXIN) 500 MG tablet TAKE 1 TABLET BY MOUTH EVERY 8 HOURS AS NEEDED FOR MUSCLE SPASMS. 30 tablet 5  . Multiple Vitamin (MULTIVITAMIN WITH MINERALS) TABS tablet Take 1 tablet by mouth at bedtime.    Marland Kitchen neomycin-polymyxin-dexameth (MAXITROL) 0.1 % OINT Place 1 application into both eyes See admin instructions. PRN: TWICE DAILY X1 WEEK & THEN DECREASE  TO ONCE DAILY FOR X2 WEEKS    . omeprazole (PRILOSEC) 20 MG capsule TAKE 1 CAPSULE (20 MG TOTAL) BY MOUTH DAILY BEFORE BREAKFAST AS NEEDED 30 capsule 11  . predniSONE (DELTASONE) 10 MG tablet Take 1 tablet by mouth daily.    . predniSONE (DELTASONE) 5 MG tablet Take 1 tablet by mouth daily.     No  current facility-administered medications on file prior to visit.     Review of Systems  Constitutional: Negative for activity change, appetite change, fatigue, fever and unexpected weight change.  HENT: Negative for congestion, ear pain, rhinorrhea, sinus pressure and sore throat.   Eyes: Negative for pain, redness and visual disturbance.  Respiratory: Negative for cough, shortness of breath and wheezing.   Cardiovascular: Negative for chest pain and palpitations.  Gastrointestinal: Positive for abdominal pain. Negative for abdominal distention, anal bleeding, blood in stool, constipation, diarrhea, nausea, rectal pain and vomiting.  Endocrine: Negative for polydipsia and polyuria.  Genitourinary: Negative for dysuria, frequency and urgency.  Musculoskeletal: Negative for arthralgias, back pain and myalgias.  Skin: Negative for pallor and rash.  Allergic/Immunologic: Negative for environmental allergies.  Neurological: Negative for dizziness, syncope and headaches.  Hematological: Negative for adenopathy. Does not bruise/bleed easily.  Psychiatric/Behavioral: Negative for decreased concentration and dysphoric mood. The patient is not nervous/anxious.        Objective:   Physical Exam Constitutional:      General: She is not in acute distress.    Appearance: Normal appearance. She is well-developed. She is obese. She is not ill-appearing.  HENT:     Head: Normocephalic and atraumatic.  Eyes:     General: No scleral icterus.    Conjunctiva/sclera: Conjunctivae normal.     Pupils: Pupils are equal, round, and reactive to light.  Neck:     Musculoskeletal: Normal range of motion and neck  supple.  Cardiovascular:     Rate and Rhythm: Normal rate and regular rhythm.     Heart sounds: Normal heart sounds.  Pulmonary:     Effort: Pulmonary effort is normal. No respiratory distress.     Breath sounds: Normal breath sounds. No wheezing or rales.  Abdominal:     General: Abdomen is flat. Bowel sounds are normal. There is no distension or abdominal bruit.     Palpations: Abdomen is soft. There is no hepatomegaly, splenomegaly, mass or pulsatile mass.     Tenderness: There is abdominal tenderness in the left lower quadrant. There is no right CVA tenderness, left CVA tenderness, guarding or rebound. Negative signs include Rovsing's sign, psoas sign and obturator sign.     Hernia: No hernia is present.     Comments: Mild LLQ tenderness   Mild discomfort when table is shaken  Musculoskeletal:     Comments: Nl rom of hips Hip rotation and flexion do not reproduce pain   Lymphadenopathy:     Cervical: No cervical adenopathy.  Skin:    General: Skin is warm and dry.     Coloration: Skin is not pale.     Findings: No erythema.  Neurological:     Mental Status: She is alert.     Coordination: Coordination normal.  Psychiatric:        Mood and Affect: Mood normal.           Assessment & Plan:   Problem List Items Addressed This Visit      Other   Diverticulitis    Early/mild/acute in pt taking chronic prednisone  Cover with augmentin bid 7d -will extend course if needed  Enc fluids- eat light and advance gradually  Avoid nuts/seeds/corn  inst to call if worse pain or any new symptoms GI or other (she is familiar and has had it before)  Consider CT if no imp Update if not starting to improve in a week or if worsening  Other Visit Diagnoses    Lower abdominal pain    -  Primary   Relevant Orders   POCT Urinalysis Dipstick (Automated) (Completed)

## 2019-06-16 ENCOUNTER — Other Ambulatory Visit: Payer: Self-pay

## 2019-06-16 ENCOUNTER — Ambulatory Visit
Admission: RE | Admit: 2019-06-16 | Discharge: 2019-06-16 | Disposition: A | Discharge status: Home / Self Care | Payer: Medicare HMO | Source: Ambulatory Visit | Attending: Family Medicine | Admitting: Family Medicine

## 2019-06-16 ENCOUNTER — Telehealth: Payer: Self-pay

## 2019-06-16 DIAGNOSIS — R1032 Left lower quadrant pain: Secondary | ICD-10-CM | POA: Insufficient documentation

## 2019-06-16 DIAGNOSIS — K5730 Diverticulosis of large intestine without perforation or abscess without bleeding: Secondary | ICD-10-CM | POA: Diagnosis not present

## 2019-06-16 DIAGNOSIS — K5792 Diverticulitis of intestine, part unspecified, without perforation or abscess without bleeding: Secondary | ICD-10-CM

## 2019-06-16 LAB — POCT I-STAT CREATININE: Creatinine, Ser: 0.7 mg/dL (ref 0.44–1.00)

## 2019-06-16 MED ORDER — IOHEXOL 300 MG/ML  SOLN
100.0000 mL | Freq: Once | INTRAMUSCULAR | Status: AC | PRN
  Administered 2019-06-16: 100 mL via INTRAVENOUS

## 2019-06-16 NOTE — Telephone Encounter (Signed)
I'm putting an order in for a CT scan [place]-if I need to put in a lab order please let me know

## 2019-06-16 NOTE — Telephone Encounter (Signed)
Pt called triage line. States that she started the amoxicillin 06-14-19. She is not feeling any better. Was told to F/U if things have not improved. She is having more abdominal pain.

## 2019-06-16 NOTE — Telephone Encounter (Signed)
Rosaria Ferries is discussing it with pt now

## 2019-06-16 NOTE — Telephone Encounter (Signed)
CT scheduled at [facility], sending patient over now to drink oral contrast and then the scan. Patient called and notified.

## 2019-06-17 DIAGNOSIS — G4733 Obstructive sleep apnea (adult) (pediatric): Secondary | ICD-10-CM | POA: Diagnosis not present

## 2019-06-20 ENCOUNTER — Ambulatory Visit (INDEPENDENT_AMBULATORY_CARE_PROVIDER_SITE_OTHER): Payer: Medicare HMO

## 2019-06-20 DIAGNOSIS — Z23 Encounter for immunization: Secondary | ICD-10-CM

## 2019-06-22 ENCOUNTER — Telehealth: Payer: Self-pay | Admitting: Family Medicine

## 2019-06-22 DIAGNOSIS — Z8719 Personal history of other diseases of the digestive system: Secondary | ICD-10-CM

## 2019-06-22 DIAGNOSIS — R1032 Left lower quadrant pain: Secondary | ICD-10-CM

## 2019-06-22 NOTE — Telephone Encounter (Signed)
Pt called wanting to get a referral to GI  She saw dr tower 10/13 for abd pain.  She stated she is not doing any better  Pain in left side/diarrhea started today.  Stomach cramps abd bloated and sore.  Offered doxy appointment pt declined

## 2019-06-22 NOTE — Telephone Encounter (Signed)
Appt scheduled with Dr Melanee Left for 06/23/2019, patient is aware.

## 2019-06-22 NOTE — Telephone Encounter (Signed)
I did ref and will route to [facility]

## 2019-06-26 ENCOUNTER — Other Ambulatory Visit: Payer: Self-pay

## 2019-06-26 ENCOUNTER — Ambulatory Visit: Payer: Medicare HMO | Admitting: Gastroenterology

## 2019-06-26 ENCOUNTER — Encounter: Payer: Self-pay | Admitting: Gastroenterology

## 2019-06-26 VITALS — BP 142/84 | HR 84 | Temp 97.5°F | Ht 60.5 in | Wt 155.4 lb

## 2019-06-26 DIAGNOSIS — R142 Eructation: Secondary | ICD-10-CM

## 2019-06-26 DIAGNOSIS — R1032 Left lower quadrant pain: Secondary | ICD-10-CM | POA: Diagnosis not present

## 2019-06-26 DIAGNOSIS — K219 Gastro-esophageal reflux disease without esophagitis: Secondary | ICD-10-CM

## 2019-06-26 DIAGNOSIS — D869 Sarcoidosis, unspecified: Secondary | ICD-10-CM

## 2019-06-26 DIAGNOSIS — K5790 Diverticulosis of intestine, part unspecified, without perforation or abscess without bleeding: Secondary | ICD-10-CM | POA: Diagnosis not present

## 2019-06-26 DIAGNOSIS — K449 Diaphragmatic hernia without obstruction or gangrene: Secondary | ICD-10-CM | POA: Diagnosis not present

## 2019-06-26 MED ORDER — MESALAMINE ER 0.375 G PO CP24: ORAL_CAPSULE | ORAL | 3 refills | Status: DC

## 2019-06-26 NOTE — Progress Notes (Signed)
P  Chief Complaint:    Lower abdominal pain, history of diverticulitis  Referring physician: Tower, Wynelle Fanny, MD  HPI:    Patient is a 73 y.o. female with a history of diverticulitis, along with Sarcoidosis, Hyperlipidemia, IBS, GERD, referred to the Gastroenterology clinic for evaluation of lower abdominal pain.  She does report a history of recurrent diverticulitis, with the last time being treated in the GI clinic in 07/2017, treated with Augmentin.  Since that time, reports a few additional episodes of diverticulitis, again treated with antibiotics.  Today, she continues to have LLQ pain nonradiating.  Aside from a single episode of nonbloody diarrhea, has generally been without change in bowel habits, until no BM in the last 3 days.  Has changed to a bland diet, without improvement.  Otherwise, no fever, chills, n/v.  Symptoms are similar to prior episodes of diverticulitis.  In total, symptoms been present for approximately 3 weeks.  Seen by her PCM for this issue on 06/13/2019, and started on Augmentin for presumed diverticulitis.  No appreciable improvement, so CT then obtained which was largely unrevealing as outlined below.  Had been responsive to Abx in the past.   Interestingly, she is currently being treated with steroids for Sarcoidosis.  Had weaned from 20 mg/day to 15 mg/day 3 days prior to sxs onset. Now on 15 mg, with plan to wean to 10 mg/day next week and hold at 10 mg/day. Repeat CT lungs scheduled for 12/1.   Normal CBC and CMP (BUN 25) in 5/20.  No repeat labs since then.  -CT (06/16/2019): Severe left-sided diverticulosis without diverticulitis, otherwise unremarkable  Separately, she has increased belching lately. Improves with Gas-X. Continues to take Prilosec daily which otherwise controls her reflux symptoms well.  No dysphagia.  Endoscopic history: -Colonoscopy (11/16, Dr. Fuller Plan): 6 mm rectal TA, sigmoid diverticulosis with associated tortuosity, angulation,  hypertrophy, luminal narrowing.  Unable to advance beyond the sigmoid colon and recommended barium enema. -Barium enema (2017): Diverticulosis, otherwise normal -Colonoscopy (02/2010, Dr.Stark): Moderate sigmoid diverticulosis, 3 subcentimeter tubular adenomas -Colonoscopy (03/2005, Dr. Fuller Plan): Left-sided diverticulosis, hemorrhoids, otherwise normal -Colonoscopy (05/2000, Dr. Fuller Plan): Diverticulosis, tortuous colon -EGD (12/1999, Dr. Fuller Plan): Small HH   Review of systems:     No chest pain, no SOB, no fevers, no urinary sx   Past Medical History:  Diagnosis Date  . Allergic rhinitis   . Allergy   . Arthritis   . Atrophic vaginitis   . Breast cancer ([place])    Right mastectomy  . Diverticulosis   . GERD (gastroesophageal reflux disease)   . H/O: pneumonia    as child-viral pneumonia  . Headache   . History of shingles   . HLD (hyperlipidemia)    pt denies  . IBS (irritable bowel syndrome)   . Osteopenia   . Sarcoidosis   . Sliding hiatal hernia     Patient's surgical history, family medical history, social history, medications and allergies were all reviewed in Epic    Current Outpatient Medications  Medication Sig Dispense Refill  . albuterol (PROVENTIL HFA;VENTOLIN HFA) 108 (90 Base) MCG/ACT inhaler Inhale 2 puffs into the lungs every 4 (four) hours as needed for wheezing or shortness of breath. 1 Inhaler 2  . amoxicillin-clavulanate (AUGMENTIN) 875-125 MG tablet Take 1 tablet by mouth 2 (two) times daily. 14 tablet 0  . calcium-vitamin D (OSCAL WITH D) 500-200 MG-UNIT per tablet Take 1 tablet by mouth once a week.     . diclofenac sodium (VOLTAREN)  1 % GEL Apply 2 g topically 3 (three) times daily as needed. 1 Tube 11  . FIBER SELECT GUMMIES PO Take 2 tablets by mouth at bedtime.    . fish oil-omega-3 fatty acids 1000 MG capsule Take 1 g by mouth once a week.     . fluticasone (FLONASE) 50 MCG/ACT nasal spray Place 2 sprays into both nostrils daily as needed. 48 mL 2  .  ibuprofen (ADVIL,MOTRIN) 200 MG tablet Take 400 mg by mouth every 8 (eight) hours as needed.     . methocarbamol (ROBAXIN) 500 MG tablet TAKE 1 TABLET BY MOUTH EVERY 8 HOURS AS NEEDED FOR MUSCLE SPASMS. 30 tablet 5  . Multiple Vitamin (MULTIVITAMIN WITH MINERALS) TABS tablet Take 1 tablet by mouth at bedtime.    Marland Kitchen neomycin-polymyxin-dexameth (MAXITROL) 0.1 % OINT Place 1 application into both eyes See admin instructions. PRN: TWICE DAILY X1 WEEK & THEN DECREASE TO ONCE DAILY FOR X2 WEEKS    . omeprazole (PRILOSEC) 20 MG capsule TAKE 1 CAPSULE (20 MG TOTAL) BY MOUTH DAILY BEFORE BREAKFAST AS NEEDED 30 capsule 11  . predniSONE (DELTASONE) 10 MG tablet Take 1 tablet by mouth daily.    . predniSONE (DELTASONE) 5 MG tablet Take 1 tablet by mouth daily.     No current facility-administered medications for this visit.     Physical Exam:     There were no vitals taken for this visit.  GENERAL:  Pleasant female in NAD PSYCH: : Cooperative, normal affect EENT:  conjunctiva pink, mucous membranes moist, neck supple without masses CARDIAC:  RRR, no murmur heard, no peripheral edema PULM: Normal respiratory effort, lungs CTA bilaterally, no wheezing ABDOMEN: Mild to moderate TTP in LLQ.  No rebound or peritoneal signs.  Nondistended, soft. 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  CT was without diverticulitis, and no appreciable response to trial of antibiotics.  Interestingly, symptoms started 3 days after weaning prednisone from 20 mg/day to 15 mg/day.  Curious about inflammation held at [place] with steroids, then uncovered with titration.  Discussed the possibility of diverticular  associated colitis, along with a full DDX today.  Discussed diagnostic modalities to include colonoscopy (previous incomplete colonoscopy 2016, and patient does not want to repeat), barium enema (normal in 2017, and patient does not want to repeat), CT colonography, inflammatory markers vs trial of therapy for diagnostic/therapeutic intent.  After a in-depth conversation, she opted for the latter.  -Start Apriso 1.5 g/day per day all therapeutic intent -Check BMP 2 weeks after starting therapy, and if normal, repeat 8 weeks later -If no appreciable improvement in the next 7 days or so, to call the GI clinic, and can plan for additional diagnostic modalities as outlined above -Follow-up with me in 2 months or sooner as needed  3) Sarcoidosis -Currently treated with prednisone 15 mg/day -On PPI currently, which will aid in gastric prophylaxis  4) GERD 5) Small hiatal hernia 6) Belching -Resume Prilosec as currently prescribed -Agree with Gas-X prn -If belching increasingly bothersome, can trial short course of high-dose PPI for diagnostic/therapeutic intent, then titrate to lowest effective dose     I spent a total of 25 minutes of face-to-face time with the patient. Greater  than 50% of the time was spent counseling and coordinating care.       Lavena Bullion ,DO, FACG 06/26/2019, 3:46 PM

## 2019-06-26 NOTE — Patient Instructions (Signed)
If you are age 74 or older, your body mass index should be between 23-30. Your Body mass index is 29.85 kg/m. If this is out of the aforementioned range listed, please consider follow up with your Primary Care Provider.  If you are age 67 or younger, your body mass index should be between 19-25. Your Body mass index is 29.85 kg/m. If this is out of the aformentioned range listed, please consider follow up with your Primary Care Provider.    We have sent the following medications to your pharmacy for you to pick up at your convenience: Apriso  Follow up in 2 months  It was a pleasure to see you today!  Vito Cirigliano, D.O.

## 2019-06-28 ENCOUNTER — Other Ambulatory Visit: Payer: Self-pay

## 2019-06-28 ENCOUNTER — Telehealth: Payer: Self-pay | Admitting: Gastroenterology

## 2019-06-28 MED ORDER — APRISO 0.375 G PO CP24: ORAL_CAPSULE | ORAL | 3 refills | Status: DC

## 2019-06-29 ENCOUNTER — Telehealth: Payer: Self-pay | Admitting: Gastroenterology

## 2019-06-29 DIAGNOSIS — G4733 Obstructive sleep apnea (adult) (pediatric): Secondary | ICD-10-CM | POA: Diagnosis not present

## 2019-06-29 NOTE — Telephone Encounter (Signed)
Insurance called regarding PA for [employer].

## 2019-06-29 NOTE — Telephone Encounter (Signed)
Medication Apriso is approved. It is approved through 08/30/2020. Vicky from [place] ran a test claim and said that it is approved now as well.   Called patient and told her this and she understands

## 2019-06-30 ENCOUNTER — Ambulatory Visit: Payer: Medicare HMO | Admitting: Physician Assistant

## 2019-07-05 DIAGNOSIS — D869 Sarcoidosis, unspecified: Secondary | ICD-10-CM | POA: Diagnosis not present

## 2019-07-11 ENCOUNTER — Other Ambulatory Visit: Payer: Self-pay | Admitting: Internal Medicine

## 2019-07-11 ENCOUNTER — Telehealth: Payer: Self-pay | Admitting: Family Medicine

## 2019-07-11 DIAGNOSIS — D869 Sarcoidosis, unspecified: Secondary | ICD-10-CM

## 2019-07-11 NOTE — Telephone Encounter (Signed)
He does not do any type on back injections.  If she is speaking of a trigger finger injections, then yes he does inject trigger fingers.

## 2019-07-11 NOTE — Telephone Encounter (Signed)
Patient called and scheduled appointment for tomorrow afternoon with Dr Lorelei Pont for pain in her back. She wanted to know if he is able to give a trigger injection before she comes in . Patient stated this would be the main reason she is coming in.

## 2019-07-12 ENCOUNTER — Ambulatory Visit: Payer: Medicare HMO | Admitting: Family Medicine

## 2019-07-12 DIAGNOSIS — M5134 Other intervertebral disc degeneration, thoracic region: Secondary | ICD-10-CM | POA: Diagnosis not present

## 2019-07-12 DIAGNOSIS — M546 Pain in thoracic spine: Secondary | ICD-10-CM | POA: Diagnosis not present

## 2019-07-18 DIAGNOSIS — G4733 Obstructive sleep apnea (adult) (pediatric): Secondary | ICD-10-CM | POA: Diagnosis not present

## 2019-07-25 DIAGNOSIS — M546 Pain in thoracic spine: Secondary | ICD-10-CM | POA: Diagnosis not present

## 2019-08-01 DIAGNOSIS — R9389 Abnormal findings on diagnostic imaging of other specified body structures: Secondary | ICD-10-CM | POA: Diagnosis not present

## 2019-08-01 DIAGNOSIS — D869 Sarcoidosis, unspecified: Secondary | ICD-10-CM | POA: Diagnosis not present

## 2019-08-01 DIAGNOSIS — R911 Solitary pulmonary nodule: Secondary | ICD-10-CM | POA: Diagnosis not present

## 2019-08-01 DIAGNOSIS — Z853 Personal history of malignant neoplasm of breast: Secondary | ICD-10-CM | POA: Diagnosis not present

## 2019-08-04 DIAGNOSIS — R911 Solitary pulmonary nodule: Secondary | ICD-10-CM | POA: Diagnosis not present

## 2019-08-04 DIAGNOSIS — D869 Sarcoidosis, unspecified: Secondary | ICD-10-CM | POA: Diagnosis not present

## 2019-08-04 DIAGNOSIS — D8685 Sarcoid myocarditis: Secondary | ICD-10-CM | POA: Diagnosis not present

## 2019-08-07 DIAGNOSIS — M546 Pain in thoracic spine: Secondary | ICD-10-CM | POA: Diagnosis not present

## 2019-08-07 DIAGNOSIS — M5134 Other intervertebral disc degeneration, thoracic region: Secondary | ICD-10-CM | POA: Diagnosis not present

## 2019-08-10 DIAGNOSIS — L304 Erythema intertrigo: Secondary | ICD-10-CM | POA: Diagnosis not present

## 2019-08-10 DIAGNOSIS — D225 Melanocytic nevi of trunk: Secondary | ICD-10-CM | POA: Diagnosis not present

## 2019-08-10 DIAGNOSIS — Z08 Encounter for follow-up examination after completed treatment for malignant neoplasm: Secondary | ICD-10-CM | POA: Diagnosis not present

## 2019-08-10 DIAGNOSIS — Z85820 Personal history of malignant melanoma of skin: Secondary | ICD-10-CM | POA: Diagnosis not present

## 2019-08-10 DIAGNOSIS — Z85828 Personal history of other malignant neoplasm of skin: Secondary | ICD-10-CM | POA: Diagnosis not present

## 2019-08-17 DIAGNOSIS — G4733 Obstructive sleep apnea (adult) (pediatric): Secondary | ICD-10-CM | POA: Diagnosis not present

## 2019-08-30 ENCOUNTER — Encounter: Payer: Self-pay | Admitting: Gastroenterology

## 2019-08-30 ENCOUNTER — Other Ambulatory Visit (INDEPENDENT_AMBULATORY_CARE_PROVIDER_SITE_OTHER): Payer: Medicare HMO

## 2019-08-30 ENCOUNTER — Ambulatory Visit: Payer: Medicare HMO | Admitting: Gastroenterology

## 2019-08-30 VITALS — BP 108/70 | HR 92 | Temp 97.8°F | Ht 60.0 in | Wt 162.0 lb

## 2019-08-30 DIAGNOSIS — Z79899 Other long term (current) drug therapy: Secondary | ICD-10-CM

## 2019-08-30 DIAGNOSIS — K219 Gastro-esophageal reflux disease without esophagitis: Secondary | ICD-10-CM

## 2019-08-30 DIAGNOSIS — R142 Eructation: Secondary | ICD-10-CM | POA: Diagnosis not present

## 2019-08-30 DIAGNOSIS — K5790 Diverticulosis of intestine, part unspecified, without perforation or abscess without bleeding: Secondary | ICD-10-CM | POA: Diagnosis not present

## 2019-08-30 DIAGNOSIS — K5730 Diverticulosis of large intestine without perforation or abscess without bleeding: Secondary | ICD-10-CM | POA: Diagnosis not present

## 2019-08-30 DIAGNOSIS — K5010 Crohn's disease of large intestine without complications: Secondary | ICD-10-CM

## 2019-08-30 DIAGNOSIS — Z7189 Other specified counseling: Secondary | ICD-10-CM

## 2019-08-30 LAB — IBC + FERRITIN
Ferritin: 51.4 ng/mL (ref 10.0–291.0)
Iron: 94 ug/dL (ref 42–145)
Saturation Ratios: 32.4 % (ref 20.0–50.0)
Transferrin: 207 mg/dL — ABNORMAL LOW (ref 212.0–360.0)

## 2019-08-30 LAB — B12 AND FOLATE PANEL
Folate: >24.1 ng/mL (ref >5.9)
Vitamin B-12: 796 pg/mL (ref 211–911)

## 2019-08-30 NOTE — Progress Notes (Signed)
P  Chief Complaint:    LLQ pain, diverticular associated colitis, GERD  GI History: 73 yo female with a history of diverticulitis as well as diverticular-associated colitis, started on Apriso in 06/2019. Has had episodes of acute diverticulitis requiring Abx with resolution, with course of Abx in 06/2019 (Augmentin). 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    Additionally with a hx of Sarcoidosis, and interestingly was weaning steroids from 20 mg/day to 15 mg/day at time of most recent flare of sxs. Currently on prednisone 10 mg/day with repeat CT in the next few months.   -CT (06/16/2019): Severe left-sided diverticulosis without diverticulitis, otherwise unremarkable  Separately, she has increased belching lately. Improves with Gas-X. Continues to take Prilosec daily which otherwise controls her reflux symptoms well.  No dysphagia.  Endoscopic history: -Colonoscopy (11/16, Dr. Fuller Plan): 6 mm rectal TA, sigmoid diverticulosis with associated tortuosity, angulation, hypertrophy, luminal narrowing.  Unable to advance beyond the sigmoid colon and recommended barium enema. -Barium enema (2017): Diverticulosis, otherwise normal -Colonoscopy (02/2010, Dr.Stark): Moderate sigmoid diverticulosis, 3 subcentimeter tubular adenomas -Colonoscopy (03/2005, Dr. Fuller Plan): Left-sided diverticulosis, hemorrhoids, otherwise normal -Colonoscopy (05/2000, Dr. Fuller Plan): Diverticulosis, tortuous colon -EGD (12/1999, Dr. Fuller Plan): Small HH   HPI:    Patient is a 73 y.o. female presenting [facility] for follow-up. Last seen by me in 10/202 and diagnosed with diverticular-associated colitis at that time and started on Apriso. She did not want to repeat any diagnostic modalities, to include colonoscopy, barium enema, CT colo, etc.   Was also evaluated for increased belching at that time. Reflux sxs o/w well controlled with Prilosec. Treated with Gas-X prn with good  relief of belching. Belching is well controlled when she takes her Prilosec as prescribed, but she misses doses regularly.   Today, she states her abdominal pain has completely resolved since starting 5-ASA therapy. Took for 2 months without any issue. Finished last week.   No new labs or imaging for review since last appt.   Review of systems:     No chest pain, no SOB, no fevers, no urinary sx   Past Medical History:  Diagnosis Date  . Allergic rhinitis   . Allergy   . Arthritis   . Atrophic vaginitis   . Breast cancer ([place])    Right mastectomy  . Diverticulosis   . GERD (gastroesophageal reflux disease)   . H/O: pneumonia    as child-viral pneumonia  . Headache   . History of shingles   . HLD (hyperlipidemia)    pt denies  . IBS (irritable bowel syndrome)   . Osteopenia   . Sarcoidosis   . Sliding hiatal hernia     Patient's surgical history, family medical history, social history, medications and allergies were all reviewed in Epic    Current Outpatient Medications  Medication Sig Dispense Refill  . albuterol (PROVENTIL HFA;VENTOLIN HFA) 108 (90 Base) MCG/ACT inhaler Inhale 2 puffs into the lungs every 4 (four) hours as needed for wheezing or shortness of breath. 1 Inhaler 2  . calcium-vitamin D (OSCAL WITH D) 500-200 MG-UNIT per tablet Take 1 tablet by mouth once a week.     . diclofenac sodium (VOLTAREN) 1 % GEL Apply 2 g topically 3 (three) times daily as needed. 1 Tube 11  . FIBER SELECT GUMMIES PO Take 2 tablets by mouth at bedtime.    . fish oil-omega-3 fatty acids 1000 MG capsule Take 1 g by mouth once a  week.     . fluticasone (FLONASE) 50 MCG/ACT nasal spray Place 2 sprays into both nostrils daily as needed. 48 mL 2  . ibuprofen (ADVIL,MOTRIN) 200 MG tablet Take 400 mg by mouth every 8 (eight) hours as needed.     . Multiple Vitamin (MULTIVITAMIN WITH MINERALS) TABS tablet Take 1 tablet by mouth at bedtime.    Marland Kitchen neomycin-polymyxin-dexameth (MAXITROL) 0.1 % OINT  Place 1 application into both eyes See admin instructions. PRN: TWICE DAILY X1 WEEK & THEN DECREASE TO ONCE DAILY FOR X2 WEEKS    . omeprazole (PRILOSEC) 20 MG capsule TAKE 1 CAPSULE (20 MG TOTAL) BY MOUTH DAILY BEFORE BREAKFAST AS NEEDED 30 capsule 11  . predniSONE (DELTASONE) 10 MG tablet Take 1 tablet by mouth daily.     No current facility-administered medications for this visit.    Physical Exam:   1  BP 108/70   Pulse 92   Temp 97.8 F (36.6 C)   Ht 5' (1.524 m)   Wt 162 lb (73.5 kg)   BMI 31.64 kg/m   GENERAL:  Pleasant female in NAD PSYCH: : Cooperative, normal affect EENT:  conjunctiva pink, mucous membranes moist, neck supple without masses ABDOMEN:  Nondistended, soft, nontender. No obvious masses, no hepatomegaly,  normal bowel sounds SKIN:  turgor, no lesions seen Musculoskeletal:  Normal muscle tone, normal strength NEURO: Alert and oriented x 3, no focal neurologic deficits   IMPRESSION and PLAN:     1) Diverticular associated colitis/Diverticulosis -Complete resolution of LLQ pain with 7-monthtrial of Apriso -If recurrence of symptoms, to call clinic and plan for CBC to rule out  leukocytosis/diverticulitis and repeat course of Apriso -Does not want to keep taking Apriso as a prophylactic measure -Does not want to undergo colonoscopy  2) GERD 3) Long-term PPI use 4) Belching  -Reflux symptoms and belch essentially well controlled when taking Prilosec regularly.  Encouraged her to take daily as this controls symptoms well - Check annual B12, folate, iron - Normal CMP and Vit D at [facility] this month - I have reviewed the indications, risks, and benefits of PPI therapy with the patient today. I have discussed studies that raise ? of increased osteoporosis, dementia, and kidney failure and explained that these studies show very weak associations of unclear significance and not clear cause and effect. We did discuss the potential for vitamin malabsorption,  to include magnesium (very rare), calcium (easily modifiable with Calcium Citrate supplement), vitamin B12 (again, correctable with oral B12 supplement), and iron (although rarely clinically significant outside patients who require iron supplementation previously), and can monitor each of these periodically with routine labs. We have agreed to continue PPI treatment in this case.  RTC in 6-12 months or sooner as needed  I spent over 30 minutes of time, including in depth chart review, independent review of results as outlined above, communicating results with the patient directly, face-to-face time with the patient, coordinating care, and ordering studies and medications as appropriate. Greater than 50% of the time was spent counseling and coordinating care.           VLavena Bullion,DO, FACG 08/30/2019, 2:52 PM

## 2019-08-30 NOTE — Patient Instructions (Signed)
If you are age 74 or older, your body mass index should be between 23-30. Your Body mass index is 31.64 kg/m. If this is out of the aforementioned range listed, please consider follow up with your Primary Care Provider.  If you are age 50 or younger, your body mass index should be between 19-25. Your Body mass index is 31.64 kg/m. If this is out of the aformentioned range listed, please consider follow up with your Primary Care Provider.   Your provider has requested that you go to the basement level for lab work before leaving today. Press "B" on the elevator. The lab is located at the first door on the left as you exit the elevator.  Follow up 6-12 months or earlier if needed.

## 2019-09-04 DIAGNOSIS — D869 Sarcoidosis, unspecified: Secondary | ICD-10-CM | POA: Diagnosis not present

## 2019-09-12 DIAGNOSIS — D8685 Sarcoid myocarditis: Secondary | ICD-10-CM | POA: Diagnosis not present

## 2019-09-26 ENCOUNTER — Ambulatory Visit: Payer: Medicare HMO

## 2019-09-29 ENCOUNTER — Ambulatory Visit: Payer: Medicare HMO | Attending: Internal Medicine

## 2019-09-29 DIAGNOSIS — Z20822 Contact with and (suspected) exposure to covid-19: Secondary | ICD-10-CM

## 2019-09-29 DIAGNOSIS — G4733 Obstructive sleep apnea (adult) (pediatric): Secondary | ICD-10-CM | POA: Diagnosis not present

## 2019-09-30 LAB — NOVEL CORONAVIRUS, NAA: SARS-CoV-2, NAA: NOT DETECTED

## 2019-10-04 DIAGNOSIS — R591 Generalized enlarged lymph nodes: Secondary | ICD-10-CM | POA: Diagnosis not present

## 2019-10-04 DIAGNOSIS — D8685 Sarcoid myocarditis: Secondary | ICD-10-CM | POA: Diagnosis not present

## 2019-10-04 DIAGNOSIS — R918 Other nonspecific abnormal finding of lung field: Secondary | ICD-10-CM | POA: Diagnosis not present

## 2019-10-05 ENCOUNTER — Ambulatory Visit: Payer: Medicare HMO | Attending: Internal Medicine

## 2019-10-05 DIAGNOSIS — D869 Sarcoidosis, unspecified: Secondary | ICD-10-CM | POA: Diagnosis not present

## 2019-10-05 DIAGNOSIS — Z23 Encounter for immunization: Secondary | ICD-10-CM

## 2019-10-05 NOTE — Progress Notes (Signed)
   Covid-19 Vaccination Clinic  Name:  SHEILLA MARIS    MRN: [id number] DOB: 25-Apr-1946  10/05/2019  Ms. Marseille was observed post Covid-19 immunization for 15 minutes without incidence. She was provided with Vaccine Information Sheet and instruction to access the V-Safe system.   Ms. Ormand was instructed to call 911 with any severe reactions post vaccine: Marland Kitchen Difficulty breathing  . Swelling of your face and throat  . A fast heartbeat  . A bad rash all over your body  . Dizziness and weakness    Immunizations Administered    Name Date Dose VIS Date Route   Pfizer COVID-19 Vaccine 10/05/2019  4:17 PM 0.3 mL 08/11/2019 Intramuscular   Manufacturer: [place]   Lot: [id number]   [place]: [address]-4

## 2019-10-13 DIAGNOSIS — D8685 Sarcoid myocarditis: Secondary | ICD-10-CM | POA: Diagnosis not present

## 2019-10-13 DIAGNOSIS — Z79899 Other long term (current) drug therapy: Secondary | ICD-10-CM | POA: Diagnosis not present

## 2019-10-21 ENCOUNTER — Ambulatory Visit: Payer: Medicare HMO

## 2019-10-27 ENCOUNTER — Ambulatory Visit: Payer: Medicare HMO

## 2019-10-30 ENCOUNTER — Ambulatory Visit: Payer: Medicare HMO | Attending: Internal Medicine

## 2019-10-30 DIAGNOSIS — Z23 Encounter for immunization: Secondary | ICD-10-CM | POA: Insufficient documentation

## 2019-10-30 NOTE — Progress Notes (Signed)
   Covid-19 Vaccination Clinic  Name:  Judith [name]    MRN: [id number] DOB: [birth year]  10/30/2019  Judith [name] was observed post Covid-19 immunization for 15 minutes without incidence. She was provided with Vaccine Information Sheet and instruction to access the V-Safe system.   Judith [name] was instructed to call 911 with any severe reactions post vaccine: Marland Kitchen Difficulty breathing  . Swelling of your face and throat  . A fast heartbeat  . A bad rash all over your body  . Dizziness and weakness    Immunizations Administered    Name Date Dose VIS Date Route   Pfizer COVID-19 Vaccine 10/30/2019  3:25 PM 0.3 mL 08/11/2019 Intramuscular   Manufacturer: [place]   Lot: [id number]   [place]: [address]-7

## 2019-11-02 DIAGNOSIS — D869 Sarcoidosis, unspecified: Secondary | ICD-10-CM | POA: Diagnosis not present

## 2019-11-09 DIAGNOSIS — Z79899 Other long term (current) drug therapy: Secondary | ICD-10-CM | POA: Diagnosis not present

## 2019-11-09 DIAGNOSIS — D8685 Sarcoid myocarditis: Secondary | ICD-10-CM | POA: Diagnosis not present

## 2019-12-03 DIAGNOSIS — D869 Sarcoidosis, unspecified: Secondary | ICD-10-CM | POA: Diagnosis not present

## 2019-12-07 DIAGNOSIS — D8685 Sarcoid myocarditis: Secondary | ICD-10-CM | POA: Diagnosis not present

## 2019-12-14 ENCOUNTER — Other Ambulatory Visit: Payer: Self-pay | Admitting: Family Medicine

## 2019-12-14 NOTE — Telephone Encounter (Signed)
Not on med list it says it was d/c on 06/26/19, no future appts., an last OV was on 06/13/19, please advise

## 2019-12-19 ENCOUNTER — Ambulatory Visit: Payer: Medicare HMO | Admitting: Internal Medicine

## 2019-12-19 ENCOUNTER — Other Ambulatory Visit: Payer: Self-pay

## 2019-12-19 ENCOUNTER — Encounter: Payer: Self-pay | Admitting: Internal Medicine

## 2019-12-19 VITALS — BP 104/62 | HR 81 | Temp 97.5°F | Ht 61.0 in | Wt 162.3 lb

## 2019-12-19 DIAGNOSIS — D869 Sarcoidosis, unspecified: Secondary | ICD-10-CM

## 2019-12-19 DIAGNOSIS — G4733 Obstructive sleep apnea (adult) (pediatric): Secondary | ICD-10-CM

## 2019-12-19 NOTE — Progress Notes (Signed)
Name: Judith [name] MRN: [id number] DOB: 12-13-1945     CONSULTATION DATE:12/19/2019  REFERRING MD :  Dr. Roxan Hockey  CHIEF COMPLAINT:   Follow-up sarcoid  STUDIES:  CT chest Jan 26, 2017 I have Independently reviewed images of  CT chest    Interpretation:B/l pulm nodules  Previous history 74 year old  abnormal CT chest with bilateral pulmonary nodules Patient has a diagnosis and history of sarcoidosis of the scalp In February 2018 patient developed a chronic cough patient was worked up for chest x-ray and a CT scan of the chest which revealed several pulmonary nodules Patient was then referred to cardiothoracic surgery for further evaluation Dr. 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 Patient sees ophthalmology on an annual basis to assess for sarcoid 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  6MWT test WNL ONO revelas signs hypoxia Placed on 2 L [place] oxygen therapy  patient diagnosed with sleep apnea with AHI of 13 123 events Lowest O2 sat was 75%  CC Follow up OSA Follow up Sarcoid Follow up hypoxia   HPI Diagnosis of stage 4 sarcoid-seems to be stable at this time intermittent SOB and DOE   hypoxia On 2L [place] oxygen at night +OSA on auto-CPAP therapy   OSA well controlled  Patient uses and benefis from therapy excellent report autoCPAP 5-15 cm h20 Excellent compliance report at 100% AHI is down to 0.2    CARDIAC SARCOIDOSIS  Dx at [facility]-chronic prednisone transitioned to MTX therapy Patient will need prolonged immunotherapy indefinitely  No  exacerbation at this time No evidence of heart failure at this time No evidence or signs of infection at this time No respiratory distress No fevers, chills, nausea, vomiting, diarrhea No evidence of lower extremity edema No evidence hemoptysis      Allergies  Allergen Reactions  . Niacin And Related Other (See Comments)    Caused black and blue marks all over body  . Meloxicam Rash      Review of Systems:  Gen:  Denies  fever, sweats, chills weight loss  HEENT: Denies blurred vision, double vision, ear pain, eye pain, hearing loss, nose bleeds, sore throat Cardiac:  No dizziness, chest pain or heaviness, chest tightness,edema, No JVD Resp:   No cough, -sputum production, -shortness of breath,-wheezing, -hemoptysis,  Gi: Denies swallowing difficulty, stomach pain, nausea or vomiting, diarrhea, constipation, bowel incontinence Gu:  Denies bladder incontinence, burning urine Ext:   Denies Joint pain, stiffness or swelling Skin: Denies  skin rash, easy bruising or bleeding or hives Endoc:  Denies polyuria, polydipsia , polyphagia or weight change Psych:   Denies depression, insomnia or hallucinations  Other:  All other systems negative  BP 104/62 (BP Location: Left Arm, Cuff Size: Normal)   Pulse 81   Temp (!) 97.5 F (36.4 C) (Temporal)   Ht 5' 1"  (1.549 m)   Wt 162 lb 4.8 oz (73.6 kg)   SpO2 94%   BMI 30.67 kg/m   Physical Examination:   General Appearance: No distress  Neuro:without  focal findings,  speech normal,  HEENT: PERRLA, EOM intact.   Pulmonary: normal breath sounds, No wheezing.  CardiovascularNormal S1,S2.  No m/r/g.   Abdomen: Benign, Soft, non-tender. Renal:  No costovertebral tenderness  GU:  Not performed at this time. Endoc: No evident thyromegaly Skin:   warm, no rashes, no ecchymosis  Extremities: normal, no  cyanosis, clubbing. PSYCHIATRIC: Mood, affect within normal limits.   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  Cardiac sarcoidosis Patient on chronic immunosuppressive therapy Patient had transition to methotrexate therapy Follow-up with [facility] cardiology  Sarcoidosis pulmonary stage IV Continue oxygen as prescribed Continue immunosuppressive therapy as prescribed  OSA moderate Continue auto CPAP as prescribed Last pressure was 5 to 12 cm water pressure Patient with excellent compliance Patient uses benefits from CPAP therapy Patient uses nasal pillows  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  The importance of social distancing was discussed today. Hand Washing Techniques and avoid touching face was advised.  MEDICATION ADJUSTMENTS/LABS AND TESTS ORDERED: Continue immunosuppressive therapy Continue auto CPAP decrease 5 to 12 cm of pressure Continue oxygen therapy as prescribed    CURRENT MEDICATIONS REVIEWED AT LENGTH WITH PATIENT TODAY   Patient satisfied with Plan of action and management. All questions answered  Follow-up in 6 months Total time spent 22 minutes  Maretta Bees Patricia Pesa, M.D.  [facility]  Medical Director [place] Director [facility] Cardio-Pulmonary Department

## 2019-12-19 NOTE — Patient Instructions (Signed)
Continue CPAP as prescribed EXCELLENT JOB!!! A+++++   Continue oxygen as prescribed

## 2019-12-29 DIAGNOSIS — G4733 Obstructive sleep apnea (adult) (pediatric): Secondary | ICD-10-CM | POA: Diagnosis not present

## 2020-01-02 DIAGNOSIS — D869 Sarcoidosis, unspecified: Secondary | ICD-10-CM | POA: Diagnosis not present

## 2020-01-05 DIAGNOSIS — D8685 Sarcoid myocarditis: Secondary | ICD-10-CM | POA: Diagnosis not present

## 2020-01-09 ENCOUNTER — Telehealth: Payer: Self-pay | Admitting: Family Medicine

## 2020-01-09 DIAGNOSIS — Z0000 Encounter for general adult medical examination without abnormal findings: Secondary | ICD-10-CM

## 2020-01-09 DIAGNOSIS — E7800 Pure hypercholesterolemia, unspecified: Secondary | ICD-10-CM

## 2020-01-09 DIAGNOSIS — K760 Fatty (change of) liver, not elsewhere classified: Secondary | ICD-10-CM

## 2020-01-09 NOTE — Telephone Encounter (Signed)
-----   Message from Ellamae Sia sent at 01/04/2020 10:00 AM EDT ----- Regarding: Lab orders for Friday, 5.21.21 Patient is scheduled for CPX labs, please order future labs, Thanks , Karna Christmas

## 2020-01-19 ENCOUNTER — Other Ambulatory Visit (INDEPENDENT_AMBULATORY_CARE_PROVIDER_SITE_OTHER): Payer: Medicare HMO

## 2020-01-19 ENCOUNTER — Ambulatory Visit (INDEPENDENT_AMBULATORY_CARE_PROVIDER_SITE_OTHER): Payer: Medicare HMO

## 2020-01-19 ENCOUNTER — Other Ambulatory Visit: Payer: Self-pay

## 2020-01-19 DIAGNOSIS — Z0000 Encounter for general adult medical examination without abnormal findings: Secondary | ICD-10-CM | POA: Diagnosis not present

## 2020-01-19 DIAGNOSIS — R69 Illness, unspecified: Secondary | ICD-10-CM | POA: Diagnosis not present

## 2020-01-19 DIAGNOSIS — K760 Fatty (change of) liver, not elsewhere classified: Secondary | ICD-10-CM | POA: Diagnosis not present

## 2020-01-19 DIAGNOSIS — E7800 Pure hypercholesterolemia, unspecified: Secondary | ICD-10-CM

## 2020-01-19 LAB — CBC WITH DIFFERENTIAL/PLATELET
Basophils Absolute: 0 10*3/uL (ref 0.0–0.1)
Basophils Relative: 0.5 % (ref 0.0–3.0)
Eosinophils Absolute: 0.1 10*3/uL (ref 0.0–0.7)
Eosinophils Relative: 0.8 % (ref 0.0–5.0)
HCT: 42.6 % (ref 36.0–46.0)
Hemoglobin: 14.4 g/dL (ref 12.0–15.0)
Lymphocytes Relative: 39.2 % (ref 12.0–46.0)
Lymphs Abs: 3.8 10*3/uL (ref 0.7–4.0)
MCHC: 33.9 g/dL (ref 30.0–36.0)
MCV: 95.2 fl (ref 78.0–100.0)
Monocytes Absolute: 0.5 10*3/uL (ref 0.1–1.0)
Monocytes Relative: 5.6 % (ref 3.0–12.0)
Neutro Abs: 5.3 10*3/uL (ref 1.4–7.7)
Neutrophils Relative %: 53.9 % (ref 43.0–77.0)
Platelets: 282 10*3/uL (ref 150.0–400.0)
RBC: 4.47 Mil/uL (ref 3.87–5.11)
RDW: 15.3 % (ref 11.5–15.5)
WBC: 9.8 10*3/uL (ref 4.0–10.5)

## 2020-01-19 LAB — COMPREHENSIVE METABOLIC PANEL
ALT: 25 U/L (ref 0–35)
AST: 15 U/L (ref 0–37)
Albumin: 4.2 g/dL (ref 3.5–5.2)
Alkaline Phosphatase: 42 U/L (ref 39–117)
BUN: 20 mg/dL (ref 6–23)
CO2: 29 mEq/L (ref 19–32)
Calcium: 8.9 mg/dL (ref 8.4–10.5)
Chloride: 105 mEq/L (ref 96–112)
Creatinine, Ser: 0.95 mg/dL (ref 0.40–1.20)
GFR: 57.36 mL/min — ABNORMAL LOW (ref >60.00)
Glucose, Bld: 80 mg/dL (ref 70–99)
Potassium: 3.6 mEq/L (ref 3.5–5.1)
Sodium: 140 mEq/L (ref 135–145)
Total Bilirubin: 0.5 mg/dL (ref 0.2–1.2)
Total Protein: 6.5 g/dL (ref 6.0–8.3)

## 2020-01-19 LAB — LIPID PANEL
Cholesterol: 209 mg/dL — ABNORMAL HIGH (ref 0–200)
HDL: 65.9 mg/dL (ref >39.00)
LDL Cholesterol: 117 mg/dL — ABNORMAL HIGH (ref 0–99)
NonHDL: 143.35
Total CHOL/HDL Ratio: 3
Triglycerides: 134 mg/dL (ref 0.0–149.0)
VLDL: 26.8 mg/dL (ref 0.0–40.0)

## 2020-01-19 LAB — TSH: TSH: 3.38 u[IU]/mL (ref 0.35–4.50)

## 2020-01-19 NOTE — Progress Notes (Signed)
PCP notes:  Health Maintenance: No gaps noted   Abnormal Screenings: none   Patient concerns: none   Nurse concerns: none   Next PCP appt.: 01/25/2020 @ 12:15 pm

## 2020-01-19 NOTE — Patient Instructions (Signed)
Judith [name] , Thank you for taking time to come for your Medicare Wellness Visit. I appreciate your ongoing commitment to your health goals. Please review the following plan we discussed and let me know if I can assist you in the future.   Screening recommendations/referrals: Colonoscopy: Up to date, completed 07/29/2015 Mammogram: due Bone Density: completed 01/26/2018 Recommended yearly ophthalmology/optometry visit for glaucoma screening and checkup Recommended yearly dental visit for hygiene and checkup  Vaccinations: Influenza vaccine: Up to date, completed 06/20/2019 Pneumococcal vaccine: Completed series Tdap vaccine: Up to date, completed 03/20/2011 Shingles vaccine: discussed    Advanced directives: Please bring a copy of your POA (Power of [place]) and/or Living Will to your next appointment.   Conditions/risks identified: hyperlipidemia  Next appointment: 01/25/2020 @ 12:15 pm    Preventive Care 65 Years and Older, Female Preventive care refers to lifestyle choices and visits with your health care provider that can promote health and wellness. What does preventive care include?  A yearly physical exam. This is also called an annual well check.  Dental exams once or twice a year.  Routine eye exams. Ask your health care provider how often you should have your eyes checked.  Personal lifestyle choices, including:  Daily care of your teeth and gums.  Regular physical activity.  Eating a healthy diet.  Avoiding tobacco and drug use.  Limiting alcohol use.  Practicing safe sex.  Taking low-dose aspirin every day.  Taking vitamin and mineral supplements as recommended by your health care provider. What happens during an annual well check? The services and screenings done by your health care provider during your annual well check will depend on your age, overall health, lifestyle risk factors, and family history of disease. Counseling  Your health care provider  may ask you questions about your:  Alcohol use.  Tobacco use.  Drug use.  Emotional well-being.  Home and relationship well-being.  Sexual activity.  Eating habits.  History of falls.  Memory and ability to understand (cognition).  Work and work Statistician.  Reproductive health. Screening  You may have the following tests or measurements:  Height, weight, and BMI.  Blood pressure.  Lipid and cholesterol levels. These may be checked every 5 years, or more frequently if you are over 59 years old.  Skin check.  Lung cancer screening. You may have this screening every year starting at age 32 if you have a 30-pack-year history of smoking and currently smoke or have quit within the past 15 years.  Fecal occult blood test (FOBT) of the stool. You may have this test every year starting at age 71.  Flexible sigmoidoscopy or colonoscopy. You may have a sigmoidoscopy every 5 years or a colonoscopy every 10 years starting at age 62.  Hepatitis C blood test.  Hepatitis B blood test.  Sexually transmitted disease (STD) testing.  Diabetes screening. This is done by checking your blood sugar (glucose) after you have not eaten for a while (fasting). You may have this done every 1-3 years.  Bone density scan. This is done to screen for osteoporosis. You may have this done starting at age 72.  Mammogram. This may be done every 1-2 years. Talk to your health care provider about how often you should have regular mammograms. Talk with your health care provider about your test results, treatment options, and if necessary, the need for more tests. Vaccines  Your health care provider may recommend certain vaccines, such as:  Influenza vaccine. This is recommended every year.  Tetanus, diphtheria, and acellular pertussis (Tdap, Td) vaccine. You may need a Td booster every 10 years.  Zoster vaccine. You may need this after age 3.  Pneumococcal 13-valent conjugate (PCV13) vaccine.  One dose is recommended after age 82.  Pneumococcal polysaccharide (PPSV23) vaccine. One dose is recommended after age 60. Talk to your health care provider about which screenings and vaccines you need and how often you need them. This information is not intended to replace advice given to you by your health care provider. Make sure you discuss any questions you have with your health care provider. Document Released: 09/13/2015 Document Revised: 05/06/2016 Document Reviewed: 06/18/2015 Elsevier Interactive Patient Education  2017 [place] Prevention in the Home Falls can cause injuries. They can happen to people of all ages. There are many things you can do to make your home safe and to help prevent falls. What can I do on the outside of my home?  Regularly fix the edges of walkways and driveways and fix any cracks.  Remove anything that might make you trip as you walk through a door, such as a raised step or threshold.  Trim any bushes or trees on the path to your home.  Use bright outdoor lighting.  Clear any walking paths of anything that might make someone trip, such as rocks or tools.  Regularly check to see if handrails are loose or broken. Make sure that both sides of any steps have handrails.  Any raised decks and porches should have guardrails on the edges.  Have any leaves, snow, or ice cleared regularly.  Use sand or salt on walking paths during winter.  Clean up any spills in your garage right away. This includes oil or grease spills. What can I do in the bathroom?  Use night lights.  Install grab bars by the toilet and in the tub and shower. Do not use towel bars as grab bars.  Use non-skid mats or decals in the tub or shower.  If you need to sit down in the shower, use a plastic, non-slip stool.  Keep the floor dry. Clean up any water that spills on the floor as soon as it happens.  Remove soap buildup in the tub or shower regularly.  Attach bath  mats securely with double-sided non-slip rug tape.  Do not have throw rugs and other things on the floor that can make you trip. What can I do in the bedroom?  Use night lights.  Make sure that you have a light by your bed that is easy to reach.  Do not use any sheets or blankets that are too big for your bed. They should not hang down onto the floor.  Have a firm chair that has side arms. You can use this for support while you get dressed.  Do not have throw rugs and other things on the floor that can make you trip. What can I do in the kitchen?  Clean up any spills right away.  Avoid walking on wet floors.  Keep items that you use a lot in easy-to-reach places.  If you need to reach something above you, use a strong step stool that has a grab bar.  Keep electrical cords out of the way.  Do not use floor polish or wax that makes floors slippery. If you must use wax, use non-skid floor wax.  Do not have throw rugs and other things on the floor that can make you trip. What can I do  with my stairs?  Do not leave any items on the stairs.  Make sure that there are handrails on both sides of the stairs and use them. Fix handrails that are broken or loose. Make sure that handrails are as long as the stairways.  Check any carpeting to make sure that it is firmly attached to the stairs. Fix any carpet that is loose or worn.  Avoid having throw rugs at the top or bottom of the stairs. If you do have throw rugs, attach them to the floor with carpet tape.  Make sure that you have a light switch at the top of the stairs and the bottom of the stairs. If you do not have them, ask someone to add them for you. What else can I do to help prevent falls?  Wear shoes that:  Do not have high heels.  Have rubber bottoms.  Are comfortable and fit you well.  Are closed at the toe. Do not wear sandals.  If you use a stepladder:  Make sure that it is fully opened. Do not climb a closed  stepladder.  Make sure that both sides of the stepladder are locked into place.  Ask someone to hold it for you, if possible.  Clearly mark and make sure that you can see:  Any grab bars or handrails.  First and last steps.  Where the edge of each step is.  Use tools that help you move around (mobility aids) if they are needed. These include:  Canes.  Walkers.  Scooters.  Crutches.  Turn on the lights when you go into a dark area. Replace any light bulbs as soon as they burn out.  Set up your furniture so you have a clear path. Avoid moving your furniture around.  If any of your floors are uneven, fix them.  If there are any pets around you, be aware of where they are.  Review your medicines with your doctor. Some medicines can make you feel dizzy. This can increase your chance of falling. Ask your doctor what other things that you can do to help prevent falls. This information is not intended to replace advice given to you by your health care provider. Make sure you discuss any questions you have with your health care provider. Document Released: 06/13/2009 Document Revised: 01/23/2016 Document Reviewed: 09/21/2014 Elsevier Interactive Patient Education  2017 [employer].

## 2020-01-19 NOTE — Progress Notes (Signed)
Subjective:   Judith [name] is a 74 y.o. female who presents for Medicare Annual (Subsequent) preventive examination.  Review of Systems: N/A   I connected with the patient today by telephone and verified that I am speaking with the correct person using two identifiers. Location patient: home Location nurse: work Persons participating in the virtual visit: patient, Marine scientist.   I discussed the limitations, risks, security and privacy concerns of performing an evaluation and management service by telephone and the availability of in person appointments. I also discussed with the patient that there may be a patient responsible charge related to this service. The patient expressed understanding and verbally consented to this telephonic visit.    Interactive audio and video telecommunications were attempted between this nurse and patient, however failed, due to patient having technical difficulties OR patient did not have access to video capability.  We continued and completed visit with audio only.     Cardiac Risk Factors include: advanced age (>26mn, >>72women);dyslipidemia     Objective:     Vitals: There were no vitals taken for this visit.  There is no height or weight on file to calculate BMI.  Advanced Directives 01/19/2020 01/18/2019 12/24/2017 02/01/2017 01/29/2017 12/23/2016 12/12/2015  Does Patient Have a Medical Advance Directive? Yes Yes Yes - Yes Yes Yes  Type of Advance Directive [place] will [place]Living will - [place] IsabellaLiving will [place] VistaLiving will [place] will  Does patient want to make changes to medical advance directive? - - - - - - No - Patient declined  Copy of [place] Chart? No - copy requested No - copy requested No - copy requested No - copy requested No - copy requested No - copy  requested No - copy requested    Tobacco Social History   Tobacco Use  Smoking Status Former Smoker  . Packs/day: 1.00  . Years: 3.00  . Pack years: 3.00  . Types: Cigarettes  . Start date: 89 . Quit date: 08/31/1968  . Years since quitting: 51.4  Smokeless Tobacco Never Used     Counseling given: Not Answered   Clinical Intake:  Pre-visit preparation completed: Yes  Pain : 0-10 Pain Score: 5  Pain Type: Chronic pain Pain Location: Back Pain Descriptors / Indicators: Aching Pain Onset: More than a month ago Pain Frequency: Constant     Nutritional Risks: None Diabetes: No  How often do you need to have someone help you when you read instructions, pamphlets, or other written materials from your doctor or pharmacy?: 1 - Never What is the last grade level you completed in school?: BS degree  Interpreter Needed?: No  Information entered by :: CJohnson, LPN  Past Medical History:  Diagnosis Date  . Allergic rhinitis   . Allergy   . Arthritis   . Atrophic vaginitis   . [place]    Right mastectomy  . Diverticulosis   . GERD (gastroesophageal reflux disease)   . H/O: pneumonia    as child-viral pneumonia  . Headache   . History of shingles   . HLD (hyperlipidemia)    pt denies  . IBS (irritable bowel syndrome)   . Osteopenia   . Sarcoidosis   . Sliding hiatal hernia    Past Surgical History:  Procedure Laterality Date  . APPENDECTOMY    . BASAL CELL CARCINOMA EXCISION  scalp  . BASAL CELL CARCINOMA EXCISION  08/2016   nose  . COLONOSCOPY  02/2010   divertics, rpt 5 years  . DEXA  2005   osteopenia, no change from 2003  . DILATION AND CURETTAGE OF UTERUS  10/1999  . LAPAROSCOPIC TOTAL HYSTERECTOMY  10/03   for fibroids/polyp  . MASTECTOMY Right 02/2009   right breast cancer  . POLYPECTOMY    . TOTAL ABDOMINAL HYSTERECTOMY  2003   for fibroids/polyps  . VIDEO BRONCHOSCOPY WITH ENDOBRONCHIAL NAVIGATION N/A 02/01/2017   Procedure:  VIDEO BRONCHOSCOPY WITH ENDOBRONCHIAL NAVIGATION;  Surgeon: Melrose Nakayama, MD;  Location: [facility] OR;  Service: Thoracic;  Laterality: N/A;   Family History  Problem Relation Age of Onset  . Prostate cancer Father   . Prostate cancer Brother   . Lymphoma Brother   . Heart attack Paternal Grandmother   . Stomach cancer Maternal Grandfather   . Colon cancer Neg Hx   . Rectal cancer Neg Hx   . Esophageal cancer Neg Hx    Social History   Socioeconomic History  . Marital status: Married    Spouse name: Not on file  . Number of children: 2  . Years of education: Not on file  . Highest education level: Not on file  Occupational History  . Occupation: Retired  Tobacco Use  . Smoking status: Former Smoker    Packs/day: 1.00    Years: 3.00    Pack years: 3.00    Types: Cigarettes    Start date: 48    Quit date: 08/31/1968    Years since quitting: 51.4  . Smokeless tobacco: Never Used  Substance and Sexual Activity  . Alcohol use: Not Currently    Alcohol/week: 0.0 standard drinks  . Drug use: No  . Sexual activity: Yes  Other Topics Concern  . Not on file  Social History Narrative   Married      2 sons      Retired      2 cups coffee/daily      Gym 3-4 times/week   Social Determinants of Radio broadcast assistant Strain: Low Risk   . Difficulty of Paying Living Expenses: Not hard at all  Food Insecurity: No Food Insecurity  . Worried About Charity fundraiser in the Last Year: Never true  . Ran Out of Food in the Last Year: Never true  Transportation Needs: No Transportation Needs  . Lack of Transportation (Medical): No  . Lack of Transportation (Non-Medical): No  Physical Activity: Sufficiently Active  . Days of Exercise per Week: 7 days  . Minutes of Exercise per Session: 30 min  Stress: No Stress Concern Present  . Feeling of Stress : Not at all  Social Connections:   . Frequency of Communication with Friends and Family:   . Frequency of Social  Gatherings with Friends and Family:   . Attends Religious Services:   . Active Member of Clubs or Organizations:   . Attends Archivist Meetings:   Marland Kitchen Marital Status:     Outpatient Encounter Medications as of 01/19/2020  Medication Sig  . albuterol (PROVENTIL HFA;VENTOLIN HFA) 108 (90 Base) MCG/ACT inhaler Inhale 2 puffs into the lungs every 4 (four) hours as needed for wheezing or shortness of breath.  . calcium-vitamin D (OSCAL WITH D) 500-200 MG-UNIT per tablet Take 1 tablet by mouth once a week.   . diclofenac sodium (VOLTAREN) 1 % GEL Apply 2 g topically 3 (three) times  daily as needed.  Marland Kitchen FIBER SELECT GUMMIES PO Take 2 tablets by mouth at bedtime.  . fish oil-omega-3 fatty acids 1000 MG capsule Take 1 g by mouth once a week.   . fluticasone (FLONASE) 50 MCG/ACT nasal spray Place 2 sprays into both nostrils daily as needed.  Marland Kitchen ibuprofen (ADVIL,MOTRIN) 200 MG tablet Take 400 mg by mouth every 8 (eight) hours as needed.   . methocarbamol (ROBAXIN) 500 MG tablet TAKE 1 TABLET BY MOUTH EVERY 8 HOURS AS NEEDED FOR MUSCLE SPASM  . methotrexate (RHEUMATREX) 2.5 MG tablet 6 tablets weekly  . Multiple Vitamin (MULTIVITAMIN WITH MINERALS) TABS tablet Take 1 tablet by mouth at bedtime.  Marland Kitchen neomycin-polymyxin-dexameth (MAXITROL) 0.1 % OINT Place 1 application into both eyes See admin instructions. PRN: TWICE DAILY X1 WEEK & THEN DECREASE TO ONCE DAILY FOR X2 WEEKS  . omeprazole (PRILOSEC) 20 MG capsule TAKE 1 CAPSULE (20 MG TOTAL) BY MOUTH DAILY BEFORE BREAKFAST AS NEEDED  . predniSONE (DELTASONE) 10 MG tablet Take 1 tablet by mouth daily. 15 mg daily  . sulfamethoxazole-trimethoprim (BACTRIM DS) 800-160 MG tablet Take 1 tablet by mouth 3 (three) times a week.   No facility-administered encounter medications on file as of 01/19/2020.    Activities of Daily Living In your present state of health, do you have any difficulty performing the following activities: 01/19/2020  Hearing? N    Vision? Y  Comment blurred vision  Difficulty concentrating or making decisions? N  Walking or climbing stairs? N  Dressing or bathing? N  Doing errands, shopping? N  Preparing Food and eating ? N  Using the Toilet? N  In the past six months, have you accidently leaked urine? Y  Comment wears pads  Do you have problems with loss of bowel control? N  Managing your Medications? N  Managing your Finances? N  Housekeeping or managing your Housekeeping? N  Some recent data might be hidden    Patient Care Team: Tower, Wynelle Fanny, MD as PCP - General Oneta Rack, MD as Consulting Physician (Dermatology)    Assessment:   This is a routine wellness examination for [place].  Exercise Activities and Dietary recommendations Current Exercise Habits: Home exercise routine, Type of exercise: walking, Time (Minutes): 30, Frequency (Times/Week): 7, Weekly Exercise (Minutes/Week): 210, Intensity: Moderate, Exercise limited by: None identified  Goals    . Patient Stated     Starting 01/18/19, I will continue to take medications as prescribed.     . Patient Stated     01/19/2020, I will continue to walk my dog 1-2 miles everyday.        Fall Risk Fall Risk  01/19/2020 01/18/2019 12/24/2017 12/23/2016 12/12/2015  Falls in the past year? 0 0 Yes No No  Comment - - accident fall in bathtub; treatment in ER - -  Number falls in past yr: 0 - 1 - -  Injury with Fall? 0 - Yes - -  Risk for fall due to : Medication side effect - - - -  Follow up Falls prevention discussed;Falls evaluation completed - - - -   Is the patient's home free of loose throw rugs in walkways, pet beds, electrical cords, etc?   yes      Grab bars in the bathroom? yes      Handrails on the stairs?   yes      Adequate lighting?   yes  Timed Get Up and Go performed: N/A  Depression Screen PHQ 2/9 Scores  01/19/2020 01/18/2019 12/24/2017 12/23/2016  PHQ - 2 Score 0 0 0 0  PHQ- 9 Score 0 0 0 -     Cognitive Function MMSE  - Mini Mental State Exam 01/19/2020 01/18/2019 12/24/2017 12/23/2016 12/12/2015  Orientation to time 5 5 5 5 5   Orientation to Place 5 5 5 5 5   Registration 3 3 3 3 3   Attention/ Calculation 5 0 0 0 0  Recall 3 3 3 3 3   Language- name 2 objects - 0 0 0 0  Language- repeat 1 1 1 1 1   Language- follow 3 step command - 0 3 3 3   Language- read & follow direction - 0 0 0 0  Write a sentence - 0 0 0 0  Copy design - 0 0 0 0  Total score - 17 20 20 20   Mini Cog  Mini-Cog screen was completed. Maximum score is 22. A value of 0 denotes this part of the MMSE was not completed or the patient failed this part of the Mini-Cog screening.       Immunization History  Administered Date(s) Administered  . Fluad Quad(high Dose 65+) 06/20/2019  . Influenza Split 06/24/2011, 06/08/2012  . Influenza Whole 07/08/2007, 06/11/2008, 06/19/2009, 06/04/2010  . Influenza,inj,Quad PF,6+ Mos 06/15/2013, 06/21/2014, 07/31/2015, 06/10/2016, 06/15/2017, 05/25/2018  . Influenza,inj,quad, With Preservative 06/29/2017  . PFIZER SARS-COV-2 Vaccination 10/05/2019, 10/30/2019  . Pneumococcal Conjugate-13 12/05/2014  . Pneumococcal Polysaccharide-23 03/20/2011  . Td 06/06/2001  . Tdap 03/20/2011    Qualifies for Shingles Vaccine: yes   Screening Tests Health Maintenance  Topic Date Due  . MAMMOGRAM  02/12/2020  . INFLUENZA VACCINE  03/31/2020  . TETANUS/TDAP  03/19/2021  . COLONOSCOPY  07/28/2025  . DEXA SCAN  Completed  . COVID-19 Vaccine  Completed  . Hepatitis C Screening  Completed  . PNA vac Low Risk Adult  Completed    Cancer Screenings: Lung: Low Dose CT Chest recommended if Age 48-80 years, 30 pack-year currently smoking OR have quit w/in 15 years. Patient does not qualify. Breast:  Up to date on Mammogram: No, due. Patient declined due to having PET scans done.    Bone Density/Dexa: completed 01/26/2018 Colorectal: completed 07/29/2015  Additional Screenings:  Hepatitis C Screening: 01/18/2019      Plan:   Patient will continue to walk her dog everyday for 1-2 miles.   I have personally reviewed and noted the following in the patient's chart:   . Medical and social history . Use of alcohol, tobacco or illicit drugs  . Current medications and supplements . Functional ability and status . Nutritional status . Physical activity . Advanced directives . List of other physicians . Hospitalizations, surgeries, and ER visits in previous 12 months . Vitals . Screenings to include cognitive, depression, and falls . Referrals and appointments  In addition, I have reviewed and discussed with patient certain preventive protocols, quality metrics, and best practice recommendations. A written personalized care plan for preventive services as well as general preventive health recommendations were provided to patient.     Andrez Grime, LPN  02/24/349

## 2020-01-25 ENCOUNTER — Encounter: Payer: Medicare HMO | Admitting: Family Medicine

## 2020-02-02 DIAGNOSIS — D869 Sarcoidosis, unspecified: Secondary | ICD-10-CM | POA: Diagnosis not present

## 2020-02-02 DIAGNOSIS — D8685 Sarcoid myocarditis: Secondary | ICD-10-CM | POA: Diagnosis not present

## 2020-02-08 DIAGNOSIS — C44519 Basal cell carcinoma of skin of other part of trunk: Secondary | ICD-10-CM | POA: Diagnosis not present

## 2020-02-08 DIAGNOSIS — L538 Other specified erythematous conditions: Secondary | ICD-10-CM | POA: Diagnosis not present

## 2020-02-08 DIAGNOSIS — D2262 Melanocytic nevi of left upper limb, including shoulder: Secondary | ICD-10-CM | POA: Diagnosis not present

## 2020-02-08 DIAGNOSIS — D2271 Melanocytic nevi of right lower limb, including hip: Secondary | ICD-10-CM | POA: Diagnosis not present

## 2020-02-08 DIAGNOSIS — L820 Inflamed seborrheic keratosis: Secondary | ICD-10-CM | POA: Diagnosis not present

## 2020-02-08 DIAGNOSIS — Z85828 Personal history of other malignant neoplasm of skin: Secondary | ICD-10-CM | POA: Diagnosis not present

## 2020-02-08 DIAGNOSIS — L821 Other seborrheic keratosis: Secondary | ICD-10-CM | POA: Diagnosis not present

## 2020-02-08 DIAGNOSIS — D485 Neoplasm of uncertain behavior of skin: Secondary | ICD-10-CM | POA: Diagnosis not present

## 2020-02-08 DIAGNOSIS — Z85820 Personal history of malignant melanoma of skin: Secondary | ICD-10-CM | POA: Diagnosis not present

## 2020-02-08 DIAGNOSIS — D225 Melanocytic nevi of trunk: Secondary | ICD-10-CM | POA: Diagnosis not present

## 2020-02-14 ENCOUNTER — Other Ambulatory Visit: Payer: Self-pay

## 2020-02-14 ENCOUNTER — Ambulatory Visit (INDEPENDENT_AMBULATORY_CARE_PROVIDER_SITE_OTHER): Payer: Medicare HMO | Admitting: Family Medicine

## 2020-02-14 ENCOUNTER — Encounter: Payer: Self-pay | Admitting: Family Medicine

## 2020-02-14 VITALS — BP 114/70 | HR 105 | Temp 96.9°F | Ht 60.0 in | Wt 159.6 lb

## 2020-02-14 DIAGNOSIS — M7061 Trochanteric bursitis, right hip: Secondary | ICD-10-CM | POA: Diagnosis not present

## 2020-02-14 DIAGNOSIS — D869 Sarcoidosis, unspecified: Secondary | ICD-10-CM

## 2020-02-14 DIAGNOSIS — Z0000 Encounter for general adult medical examination without abnormal findings: Secondary | ICD-10-CM

## 2020-02-14 DIAGNOSIS — E7800 Pure hypercholesterolemia, unspecified: Secondary | ICD-10-CM | POA: Diagnosis not present

## 2020-02-14 DIAGNOSIS — M7062 Trochanteric bursitis, left hip: Secondary | ICD-10-CM

## 2020-02-14 DIAGNOSIS — R000 Tachycardia, unspecified: Secondary | ICD-10-CM | POA: Diagnosis not present

## 2020-02-14 DIAGNOSIS — M8589 Other specified disorders of bone density and structure, multiple sites: Secondary | ICD-10-CM

## 2020-02-14 DIAGNOSIS — D126 Benign neoplasm of colon, unspecified: Secondary | ICD-10-CM | POA: Diagnosis not present

## 2020-02-14 MED ORDER — METHOCARBAMOL 500 MG PO TABS: ORAL_TABLET | ORAL | 3 refills | Status: DC

## 2020-02-14 NOTE — Assessment & Plan Note (Signed)
This has improved with prednisone

## 2020-02-14 NOTE — Patient Instructions (Addendum)
Ask your pulmonologist about getting the shingrix vaccine  If you are interested in the new shingles vaccine (Shingrix) - call your local pharmacy to check on coverage and availability  If affordable, get on a wait list at your pharmacy to get the vaccine.  Please schedule your mammogram   Keep walking Take care of yourself  Use sun protection

## 2020-02-14 NOTE — Assessment & Plan Note (Signed)
Continues pulm and cardiology f/u  Working to eventually wean off prednisone  On methotrexate  Slow progress Her specialists continue to follow PET scans

## 2020-02-14 NOTE — Assessment & Plan Note (Signed)
Stable 5/19 dexa No fx One fall  (disc fall prev) Taking prednisone-slow taper and hopes to get off of it  Good exercise habits Taking ca and D

## 2020-02-14 NOTE — Assessment & Plan Note (Addendum)
Reviewed health habits including diet and exercise and skin cancer prevention Reviewed appropriate screening tests for age  Also reviewed health mt list, fam hx and immunization status , as well as social and family history   See HPI amw rev Labs rev Vaccinated for covid Considering shingrix-discussed  Consider cologuard at next visit  Rev dexa 5/19 - stable osteopenia /no fractures , enc ca and D and exercise  Goes to dermatology- had basal cell lesion tx , enc to continue sun protection  Enc pt to schedule her mammogram

## 2020-02-14 NOTE — Assessment & Plan Note (Signed)
Disc goals for lipids and reasons to control them Rev last labs with pt Rev low sat fat diet in detail Some improvement with better diet and exercise

## 2020-02-14 NOTE — Progress Notes (Signed)
Subjective:    Patient ID: Barbara Cower, female    DOB: [birth year], 74 y.o.   MRN: [id number]  This visit occurred during the SARS-CoV-2 public health emergency.  Safety protocols were in place, including screening questions prior to the visit, additional usage of staff PPE, and extensive cleaning of exam room while observing appropriate contact time as indicated for disinfecting solutions.    HPI Here for health maintenance exam and to review chronic medical problems   Wt Readings from Last 3 Encounters:  02/14/20 159 lb 9 oz (72.4 kg)  12/19/19 162 lb 4.8 oz (73.6 kg)  08/30/19 162 lb (73.5 kg)  wt is good  31.16 kg/m  covid status- vaccinated  Zoster status - has not had shingrix   Had amw on 5/21 No gaps or abn screenings   Mammogram 6/19- did not get a mammogram due to numerous pet scans  Self breast exam - no lumps   Colonoscopy 11/16 (could not complete fully) Tortuous colon  Consider cologuard at 5 y  dexa 5/19 -mild osteopenia, stable Falls- tripped over dogs leash- no injuries  Fractures - none  Supplements-ca and D  Exercise - has been walking daily 1.5 miles   BP Readings from Last 3 Encounters:  02/14/20 114/70  12/19/19 104/62  08/30/19 108/70   Pulse Readings from Last 3 Encounters:  02/14/20 (!) 105  12/19/19 81  08/30/19 92  her HR goes up more now with lung issues/sarcoid    Hyperlipidemia Lab Results  Component Value Date   CHOL 209 (H) 01/19/2020   CHOL 195 01/18/2019   CHOL 185 12/24/2017   Lab Results  Component Value Date   HDL 65.90 01/19/2020   HDL 46.20 01/18/2019   HDL 53.20 12/24/2017   Lab Results  Component Value Date   LDLCALC 117 (H) 01/19/2020   LDLCALC 128 (H) 01/18/2019   LDLCALC 109 (H) 12/24/2017   Lab Results  Component Value Date   TRIG 134.0 01/19/2020   TRIG 100.0 01/18/2019   TRIG 111.0 12/24/2017   Lab Results  Component Value Date   CHOLHDL 3 01/19/2020   CHOLHDL 4 01/18/2019   CHOLHDL 3  12/24/2017   Lab Results  Component Value Date   LDLDIRECT 154.0 03/13/2011   LDLDIRECT 134.4 12/18/2008   LDLDIRECT 148.2 11/17/2007   Improved LDL and HDL  Doing well with diet and exercise   Patient Active Problem List   Diagnosis Date Noted  . Abdominal pain, left lower quadrant 06/16/2019  . Screening mammogram, encounter for 01/07/2018  . Shortness of breath 10/25/2017  . Sinus tachycardia 10/25/2017  . Multiple lung nodules on CT 10/23/2017  . Spondylolisthesis, lumbar region 10/14/2017  . Pain in thoracic spine 10/14/2017  . Backache 10/14/2017  . Low back pain 10/06/2017  . Cough 11/16/2016  . Routine general medical examination at a health care facility 01/06/2016  . IBS (irritable colon syndrome) 05/31/2015  . Colon polyps 05/31/2015  . Trochanteric bursitis of both hips 05/31/2015  . Estrogen deficiency 12/05/2014  . Encounter for Medicare annual wellness exam 09/24/2013  . Personal history of other malignant neoplasm of skin 09/21/2013  . Enlarged lymph node 03/07/2013  . Sarcoid 08/09/2012  . Mixed incontinence urge and stress 03/16/2012  . ALOPECIA 11/14/2010  . GERD 01/24/2009  . NEOPLASM, MALIGNANT, BREAST, HX OF 01/02/2009  . Hyperlipidemia 11/17/2007  . VAGINITIS, ATROPHIC, SYMPTOMATIC 11/17/2007  . Osteopenia 11/17/2007  . TINNITUS 09/07/2007  . ALLERGIC RHINITIS 09/07/2007  . DIVERTICULOSIS,  COLON 09/07/2007  . Fatty liver 09/07/2007  . History of diverticulitis 08/12/2007   Past Medical History:  Diagnosis Date  . Allergic rhinitis   . Allergy   . Arthritis   . Atrophic vaginitis   . Breast cancer ([facility])    Right mastectomy  . Diverticulosis   . GERD (gastroesophageal reflux disease)   . H/O: pneumonia    as child-viral pneumonia  . Headache   . History of shingles   . HLD (hyperlipidemia)    pt denies  . IBS (irritable bowel syndrome)   . Osteopenia   . Sarcoidosis   . Sliding hiatal hernia    Past Surgical History:  Procedure  Laterality Date  . APPENDECTOMY    . BASAL CELL CARCINOMA EXCISION     scalp  . BASAL CELL CARCINOMA EXCISION  08/2016   nose  . COLONOSCOPY  02/2010   divertics, rpt 5 years  . DEXA  2005   osteopenia, no change from 2003  . DILATION AND CURETTAGE OF UTERUS  10/1999  . LAPAROSCOPIC TOTAL HYSTERECTOMY  10/03   for fibroids/polyp  . MASTECTOMY Right 02/2009   right breast cancer  . POLYPECTOMY    . TOTAL ABDOMINAL HYSTERECTOMY  2003   for fibroids/polyps  . VIDEO BRONCHOSCOPY WITH ENDOBRONCHIAL NAVIGATION N/A 02/01/2017   Procedure: VIDEO BRONCHOSCOPY WITH ENDOBRONCHIAL NAVIGATION;  Surgeon: Melrose Nakayama, MD;  Location: [facility] OR;  Service: Thoracic;  Laterality: N/A;   Social History   Tobacco Use  . Smoking status: Former Smoker    Packs/day: 1.00    Years: 3.00    Pack years: 3.00    Types: Cigarettes    Start date: 70    Quit date: 08/31/1968    Years since quitting: 51.4  . Smokeless tobacco: Never Used  Vaping Use  . Vaping Use: Never used  Substance Use Topics  . Alcohol use: Not Currently    Alcohol/week: 0.0 standard drinks  . Drug use: No   Family History  Problem Relation Age of Onset  . Prostate cancer Father   . Prostate cancer Brother   . Lymphoma Brother   . Heart attack Paternal Grandmother   . Stomach cancer Maternal Grandfather   . Colon cancer Neg Hx   . Rectal cancer Neg Hx   . Esophageal cancer Neg Hx    Allergies  Allergen Reactions  . Niacin And Related Other (See Comments)    Caused black and blue marks all over body  . Meloxicam Rash   Current Outpatient Medications on File Prior to Visit  Medication Sig Dispense Refill  . albuterol (PROVENTIL HFA;VENTOLIN HFA) 108 (90 Base) MCG/ACT inhaler Inhale 2 puffs into the lungs every 4 (four) hours as needed for wheezing or shortness of breath. 1 Inhaler 2  . calcium-vitamin D (OSCAL WITH D) 500-200 MG-UNIT per tablet Take 1 tablet by mouth once a week.     . diclofenac sodium (VOLTAREN) 1  % GEL Apply 2 g topically 3 (three) times daily as needed. 1 Tube 11  . FIBER SELECT GUMMIES PO Take 2 tablets by mouth at bedtime.    . fish oil-omega-3 fatty acids 1000 MG capsule Take 1 g by mouth once a week.     . fluticasone (FLONASE) 50 MCG/ACT nasal spray Place 2 sprays into both nostrils daily as needed. 48 mL 2  . ibuprofen (ADVIL,MOTRIN) 200 MG tablet Take 400 mg by mouth every 8 (eight) hours as needed.     Marland Kitchen  methotrexate (RHEUMATREX) 2.5 MG tablet 6 tablets weekly    . Multiple Vitamin (MULTIVITAMIN WITH MINERALS) TABS tablet Take 1 tablet by mouth at bedtime.    Marland Kitchen neomycin-polymyxin-dexameth (MAXITROL) 0.1 % OINT Place 1 application into both eyes See admin instructions. PRN: TWICE DAILY X1 WEEK & THEN DECREASE TO ONCE DAILY FOR X2 WEEKS    . predniSONE (DELTASONE) 10 MG tablet Take 1 tablet by mouth daily. 15 mg daily     No current facility-administered medications on file prior to visit.    Review of Systems  Constitutional: Positive for fatigue. Negative for activity change, appetite change, fever and unexpected weight change.  HENT: Negative for congestion, ear pain, rhinorrhea, sinus pressure and sore throat.   Eyes: Negative for pain, redness and visual disturbance.  Respiratory: Positive for shortness of breath. Negative for cough and wheezing.        Baseline sob is improving Able to walk 1-2 mi  Cardiovascular: Negative for chest pain, palpitations and leg swelling.  Gastrointestinal: Negative for abdominal pain, blood in stool, constipation and diarrhea.       No recent colitis flares  Endocrine: Negative for polydipsia and polyuria.  Genitourinary: Negative for dysuria, frequency and urgency.  Musculoskeletal: Positive for arthralgias and back pain. Negative for myalgias.  Skin: Negative for pallor and rash.  Allergic/Immunologic: Negative for environmental allergies.  Neurological: Negative for dizziness, syncope and headaches.  Hematological: Negative for  adenopathy. Does not bruise/bleed easily.  Psychiatric/Behavioral: Negative for decreased concentration and dysphoric mood. The patient is not nervous/anxious.        Objective:   Physical Exam Constitutional:      General: She is not in acute distress.    Appearance: Normal appearance. She is well-developed. She is obese. She is not ill-appearing or diaphoretic.  HENT:     Head: Normocephalic and atraumatic.     Right Ear: Tympanic membrane, ear canal and external ear normal.     Left Ear: Tympanic membrane, ear canal and external ear normal.     Nose: Nose normal. No congestion.     Mouth/Throat:     Mouth: Mucous membranes are moist.     Pharynx: Oropharynx is clear. No posterior oropharyngeal erythema.  Eyes:     General: No scleral icterus.    Extraocular Movements: Extraocular movements intact.     Conjunctiva/sclera: Conjunctivae normal.     Pupils: Pupils are equal, round, and reactive to light.  Neck:     Thyroid: No thyromegaly.     Vascular: No carotid bruit or JVD.  Cardiovascular:     Rate and Rhythm: Normal rate and regular rhythm.     Pulses: Normal pulses.     Heart sounds: Normal heart sounds. No gallop.   Pulmonary:     Effort: Pulmonary effort is normal. No respiratory distress.     Breath sounds: Normal breath sounds. No wheezing.     Comments: Good air exch Chest:     Chest wall: No tenderness.  Abdominal:     General: Bowel sounds are normal. There is no distension or abdominal bruit.     Palpations: Abdomen is soft. There is no mass.     Tenderness: There is no abdominal tenderness.     Hernia: No hernia is present.  Genitourinary:    Comments: Breast exam left No mass, nodules, thickening, tenderness, bulging, retraction, inflamation, nipple discharge or skin changes noted.  No axillary or clavicular LA.     R mastectomy site is clear  Musculoskeletal:        General: No tenderness. Normal range of motion.     Cervical back: Normal range of  motion and neck supple. No rigidity. No muscular tenderness.     Right lower leg: No edema.     Left lower leg: No edema.  Lymphadenopathy:     Cervical: No cervical adenopathy.  Skin:    General: Skin is warm and dry.     Coloration: Skin is not pale.     Findings: No erythema or rash.     Comments: Solar lentigines diffusely Scar from recent skin ca tx mid back  Some comedones   Neurological:     Mental Status: She is alert. Mental status is at baseline.     Cranial Nerves: No cranial nerve deficit.     Motor: No abnormal muscle tone.     Coordination: Coordination normal.     Gait: Gait normal.     Deep Tendon Reflexes: Reflexes normal.  Psychiatric:        Mood and Affect: Mood normal.        Cognition and Memory: Cognition and memory normal.           Assessment & Plan:   Problem List Items Addressed This Visit      Digestive   Colon polyps    Consider cologuard at the 5 y mark Could not complete last colonoscopy due to tortuous colon        Musculoskeletal and Integument   Osteopenia    Stable 5/19 dexa No fx One fall  (disc fall prev) Taking prednisone-slow taper and hopes to get off of it  Good exercise habits Taking ca and D       Trochanteric bursitis of both hips    This has improved with prednisone         Other   Hyperlipidemia    Disc goals for lipids and reasons to control them Rev last labs with pt Rev low sat fat diet in detail Some improvement with better diet and exercise       Sarcoid    Continues pulm and cardiology f/u  Working to eventually wean off prednisone  On methotrexate  Slow progress Her specialists continue to follow PET scans      Routine general medical examination at a health care facility - Primary    Reviewed health habits including diet and exercise and skin cancer prevention Reviewed appropriate screening tests for age  Also reviewed health mt list, fam hx and immunization status , as well as social and  family history   See HPI amw rev Labs rev Vaccinated for covid Considering shingrix-discussed  Consider cologuard at next visit  Rev dexa 5/19 - stable osteopenia /no fractures , enc ca and D and exercise  Goes to dermatology- had basal cell lesion tx , enc to continue sun protection  Enc pt to schedule her mammogram       Sinus tachycardia    Per pt -HR goes up more with exertion with the pulm sarcoid  Will watch

## 2020-02-14 NOTE — Assessment & Plan Note (Signed)
Per pt -HR goes up more with exertion with the pulm sarcoid  Will watch

## 2020-02-14 NOTE — Telephone Encounter (Signed)
DONE

## 2020-02-14 NOTE — Assessment & Plan Note (Signed)
Consider cologuard at the 5 y mark Could not complete last colonoscopy due to tortuous colon

## 2020-03-01 DIAGNOSIS — D8685 Sarcoid myocarditis: Secondary | ICD-10-CM | POA: Diagnosis not present

## 2020-03-03 DIAGNOSIS — D869 Sarcoidosis, unspecified: Secondary | ICD-10-CM | POA: Diagnosis not present

## 2020-03-06 DIAGNOSIS — C44519 Basal cell carcinoma of skin of other part of trunk: Secondary | ICD-10-CM | POA: Diagnosis not present

## 2020-03-08 ENCOUNTER — Other Ambulatory Visit: Payer: Self-pay

## 2020-03-08 ENCOUNTER — Encounter: Payer: Self-pay | Admitting: Family Medicine

## 2020-03-08 ENCOUNTER — Ambulatory Visit (INDEPENDENT_AMBULATORY_CARE_PROVIDER_SITE_OTHER): Payer: Medicare HMO | Admitting: Family Medicine

## 2020-03-08 VITALS — BP 124/78 | HR 101 | Temp 97.9°F | Wt 161.0 lb

## 2020-03-08 DIAGNOSIS — S80261A Insect bite (nonvenomous), right knee, initial encounter: Secondary | ICD-10-CM | POA: Diagnosis not present

## 2020-03-08 DIAGNOSIS — W57XXXA Bitten or stung by nonvenomous insect and other nonvenomous arthropods, initial encounter: Secondary | ICD-10-CM

## 2020-03-08 NOTE — Patient Instructions (Signed)
Apply topical steroid cream likely cortisone cream x 2 times daily.   Tick Bite Information, Adult  Ticks are insects that draw blood for food. Most ticks live in shrubs and grassy areas. They climb onto people and animals that brush against the leaves and grasses that they rest on. Then they bite, attaching themselves to the skin. Most ticks are harmless, but some ticks carry germs that can spread to a person through a bite and cause a disease. To reduce your risk of getting a disease from a tick bite, it is important to take steps to prevent tick bites. It is also important to check for ticks after being outdoors. If you find that a tick has attached to you, watch for symptoms of disease. How can I prevent tick bites? Take these steps to help prevent tick bites when you are outdoors in an area where ticks are found:  Use insect repellent that has DEET (20% or higher), picaridin, or IR3535 in it. Use it on: ? Skin that is showing. ? The top of your boots. ? Your pant legs. ? Your sleeve cuffs.  For repellent products that contain permethrin, follow product instructions. Use these products on: ? Clothing. ? Gear. ? Boots. ? Tents.  Wear protective clothing. Long sleeves and long pants offer the best protection from ticks.  Wear light-colored clothing so you can see ticks more easily.  Tuck your pant legs into your socks.  If you go walking on a trail, stay in the middle of the trail so your skin, hair, and clothing do not touch the bushes.  Avoid walking through areas with long grass.  Check for ticks on your clothing, hair, and skin often while you are outside, and check again before you go inside. Make sure to check the places that ticks attach themselves most often. These places include the scalp, neck, armpits, waist, groin, and joint areas. Ticks that carry a disease called Lyme disease have to be attached to the skin for 24-48 hours. Checking for ticks every day will lessen your  risk of this and other diseases.  When you come indoors, wash your clothes and take a shower or a bath right away. Dry your clothes in a dryer on high heat for at least 60 minutes. This will kill any ticks in your clothes. What is the proper way to remove a tick? If you find a tick on your body, remove it as soon as possible. Removing a tick sooner rather than later can prevent germs from passing from the tick to your body. To remove a tick that is crawling on your skin but has not bitten:  Go outdoors and brush the tick off.  Remove the tick with tape or a lint roller. To remove a tick that is attached to your skin:  Wash your hands.  If you have latex gloves, put them on.  Use tweezers, curved forceps, or a tick-removal tool to gently grasp the tick as close to your skin and the tick's head as possible.  Gently pull with steady, upward pressure until the tick lets go. When removing the tick: ? Take care to keep the tick's head attached to its body. ? Do not twist or jerk the tick. This can make the tick's head or mouth break off. ? Do not squeeze or crush the tick's body. This could force disease-carrying fluids from the tick into your body. Do not try to remove a tick with heat, alcohol, petroleum jelly, or fingernail  polish. Using these methods can cause the tick to salivate and regurgitate into your bloodstream, increasing your risk of getting a disease. What should I do after removing a tick?  Clean the bite area with soap and water, rubbing alcohol, or an iodine scrub.  If an antiseptic cream or ointment is available, apply a small amount to the bite site.  Wash and disinfect any instruments that you used to remove the tick. How should I dispose of a tick? To dispose of a live tick, use one of these methods:  Place it in rubbing alcohol.  Place it in a sealed bag or container.  Wrap it tightly in tape.  Flush it down the toilet. Contact a health care provider if:  You  have symptoms of a disease after a tick bite. Symptoms of a tick-borne disease can occur from moments after the tick bites to up to 30 days after a tick is removed. Symptoms include: ? Muscle, joint, or bone pain. ? Difficulty walking or moving your legs. ? Numbness in the legs. ? Paralysis. ? Red rash around the tick bite area that is shaped like a target or a "bull's-eye." ? Redness and swelling in the area of the tick bite. ? Fever. ? Repeated vomiting. ? Diarrhea. ? Weight loss. ? Tender, swollen lymph glands. ? Shortness of breath. ? Cough. ? Pain in the abdomen. ? Headache. ? Abnormal tiredness. ? A change in your level of consciousness. ? Confusion. Get help right away if:  You are not able to remove a tick.  A part of a tick breaks off and gets stuck in your skin.  Your symptoms get worse. Summary  Ticks may carry germs that can spread to a person through a bite and cause disease.  Wear protective clothing and use insect repellent to prevent tick bites. Follow product instructions.  If you find a tick on your body, remove it as soon as possible. If the tick is attached, do not try to remove with heat, alcohol, petroleum jelly, or fingernail polish.  Remove the attached tick using tweezers, curved forceps, or a tick-removal tool. Gently pull with steady, upward pressure until the tick lets go. Do not twist or jerk the tick. Do not squeeze or crush the tick's body.  If you have symptoms after being bitten by a tick, contact a health care provider. This information is not intended to replace advice given to you by your health care provider. Make sure you discuss any questions you have with your health care provider. Document Revised: 07/30/2017 Document Reviewed: 05/29/2016 Elsevier Patient Education  2020 [employer].

## 2020-03-08 NOTE — Assessment & Plan Note (Signed)
Successful removal. No local infeciton only local allergic reaction. Apply topical steroid cream to site BID.  Reviewed timeline of tick borne illness and symptoms.  Low risk of transmission of tick borne bacteria given short attachment time.  Keep close eye though with low threshold to treat given immunocompromised.

## 2020-03-08 NOTE — Progress Notes (Signed)
Chief Complaint  Patient presents with  . Tick Removal    right popliteal     History of Present Illness: HPI   74 year old female presents with  Tick attached to right posterior knee.  She reports noting tick attached  This AM  redness, itching. She was outside last night.. likely present 24 hours.  Appears to be a adult lone start tick.  No fever. No SOB   She is immunocompromised on methotrexate.    This visit occurred during the SARS-CoV-2 public health emergency.  Safety protocols were in place, including screening questions prior to the visit, additional usage of staff PPE, and extensive cleaning of exam room while observing appropriate contact time as indicated for disinfecting solutions.   COVID 19 screen:  No recent travel or known exposure to COVID19 The patient denies respiratory symptoms of COVID 19 at this time. The importance of social distancing was discussed today.     Review of Systems  Constitutional: Negative for chills and fever.  HENT: Negative for congestion and ear pain.   Eyes: Negative for pain and redness.  Respiratory: Negative for cough and shortness of breath.   Cardiovascular: Negative for chest pain, palpitations and leg swelling.  Gastrointestinal: Negative for abdominal pain, blood in stool, constipation, diarrhea, nausea and vomiting.  Genitourinary: Negative for dysuria.  Musculoskeletal: Negative for falls and myalgias.  Skin: Negative for rash.  Neurological: Negative for dizziness.  Psychiatric/Behavioral: Negative for depression. The patient is not nervous/anxious.       Past Medical History:  Diagnosis Date  . Allergic rhinitis   . Allergy   . Arthritis   . Atrophic vaginitis   . Breast cancer ([place])    Right mastectomy  . Diverticulosis   . GERD (gastroesophageal reflux disease)   . H/O: pneumonia    as child-viral pneumonia  . Headache   . History of shingles   . HLD (hyperlipidemia)    pt denies  . IBS (irritable  bowel syndrome)   . Osteopenia   . Sarcoidosis   . Sliding hiatal hernia     reports that she quit smoking about 51 years ago. Her smoking use included cigarettes. She started smoking about 54 years ago. She has a 3.00 pack-year smoking history. She has never used smokeless tobacco. She reports previous alcohol use. She reports that she does not use drugs.   Current Outpatient Medications:  .  albuterol (PROVENTIL HFA;VENTOLIN HFA) 108 (90 Base) MCG/ACT inhaler, Inhale 2 puffs into the lungs every 4 (four) hours as needed for wheezing or shortness of breath., Disp: 1 Inhaler, Rfl: 2 .  calcium-vitamin D (OSCAL WITH D) 500-200 MG-UNIT per tablet, Take 1 tablet by mouth once a week. , Disp: , Rfl:  .  diclofenac sodium (VOLTAREN) 1 % GEL, Apply 2 g topically 3 (three) times daily as needed., Disp: 1 Tube, Rfl: 11 .  FIBER SELECT GUMMIES PO, Take 2 tablets by mouth at bedtime., Disp: , Rfl:  .  fish oil-omega-3 fatty acids 1000 MG capsule, Take 1 g by mouth once a week. , Disp: , Rfl:  .  fluticasone (FLONASE) 50 MCG/ACT nasal spray, Place 2 sprays into both nostrils daily as needed., Disp: 48 mL, Rfl: 2 .  ibuprofen (ADVIL,MOTRIN) 200 MG tablet, Take 400 mg by mouth every 8 (eight) hours as needed. , Disp: , Rfl:  .  methocarbamol (ROBAXIN) 500 MG tablet, TAKE 1 TABLET BY MOUTH EVERY 8 HOURS AS NEEDED FOR MUSCLE SPASM, Disp:  90 tablet, Rfl: 3 .  methotrexate (RHEUMATREX) 2.5 MG tablet, 6 tablets weekly, Disp: , Rfl:  .  Multiple Vitamin (MULTIVITAMIN WITH MINERALS) TABS tablet, Take 1 tablet by mouth at bedtime., Disp: , Rfl:  .  neomycin-polymyxin-dexameth (MAXITROL) 0.1 % OINT, Place 1 application into both eyes See admin instructions. PRN: TWICE DAILY X1 WEEK & THEN DECREASE TO ONCE DAILY FOR X2 WEEKS, Disp: , Rfl:  .  PREDNISONE PO, Take 15 mg by mouth daily with breakfast. Take 45m by mouth daily, Disp: , Rfl:    Observations/Objective: Blood pressure 124/78, pulse (!) 101, temperature 97.9  F (36.6 C), temperature source Temporal, weight 161 lb (73 kg), SpO2 97 %.  Physical Exam Constitutional:      General: She is not in acute distress.    Appearance: Normal appearance. She is well-developed. She is not ill-appearing or toxic-appearing.  HENT:     Head: Normocephalic.     Right Ear: Hearing, tympanic membrane, ear canal and external ear normal. Tympanic membrane is not erythematous, retracted or bulging.     Left Ear: Hearing, tympanic membrane, ear canal and external ear normal. Tympanic membrane is not erythematous, retracted or bulging.     Nose: No mucosal edema or rhinorrhea.     Right Sinus: No maxillary sinus tenderness or frontal sinus tenderness.     Left Sinus: No maxillary sinus tenderness or frontal sinus tenderness.     Mouth/Throat:     Pharynx: Uvula midline.  Eyes:     General: Lids are normal. Lids are everted, no foreign bodies appreciated.     Conjunctiva/sclera: Conjunctivae normal.     Pupils: Pupils are equal, round, and reactive to light.  Neck:     Thyroid: No thyroid mass or thyromegaly.     Vascular: No carotid bruit.     Trachea: Trachea normal.  Cardiovascular:     Rate and Rhythm: Normal rate and regular rhythm.     Pulses: Normal pulses.     Heart sounds: Normal heart sounds, S1 normal and S2 normal. No murmur heard.  No friction rub. No gallop.   Pulmonary:     Effort: Pulmonary effort is normal. No tachypnea or respiratory distress.     Breath sounds: Normal breath sounds. No decreased breath sounds, wheezing, rhonchi or rales.  Abdominal:     General: Bowel sounds are normal.     Palpations: Abdomen is soft.     Tenderness: There is no abdominal tenderness.  Musculoskeletal:     Cervical back: Normal range of motion and neck supple.  Skin:    General: Skin is warm and dry.     Findings: No rash.     Comments: Procedure: Area cleaned with alcohol.  Gentle pressure applied to attachment site of tick... tick released with  mouthparts intact. No complications.  1 cm redness at site of bite. No increased warmth.  Neurological:     Mental Status: She is alert.  Psychiatric:        Mood and Affect: Mood is not anxious or depressed.        Speech: Speech normal.        Behavior: Behavior normal. Behavior is cooperative.        Thought Content: Thought content normal.        Judgment: Judgment normal.      Assessment and Plan Tick bite Successful removal. No local infeciton only local allergic reaction. Apply topical steroid cream to site BID.  Reviewed timeline of  tick borne illness and symptoms.  Low risk of transmission of tick borne bacteria given short attachment time.  Keep close eye though with low threshold to treat given immunocompromised.       Eliezer Lofts, MD

## 2020-03-13 ENCOUNTER — Encounter: Payer: Self-pay | Admitting: Podiatry

## 2020-03-13 ENCOUNTER — Other Ambulatory Visit: Payer: Self-pay

## 2020-03-13 ENCOUNTER — Ambulatory Visit (INDEPENDENT_AMBULATORY_CARE_PROVIDER_SITE_OTHER): Payer: Medicare HMO

## 2020-03-13 ENCOUNTER — Ambulatory Visit: Payer: Medicare HMO | Admitting: Podiatry

## 2020-03-13 DIAGNOSIS — M7662 Achilles tendinitis, left leg: Secondary | ICD-10-CM | POA: Diagnosis not present

## 2020-03-13 DIAGNOSIS — M722 Plantar fascial fibromatosis: Secondary | ICD-10-CM | POA: Diagnosis not present

## 2020-03-13 NOTE — Progress Notes (Signed)
Subjective:  Patient ID: Judith [name], female    DOB: 06/28/1946,  MRN: [id number] HPI Chief Complaint  Patient presents with  . Foot Pain    Patient presents today for left heel pain x 6 months off and on.  She says its painful in the mornings or when first standing up.  Its achy and painful to walk and sore on the back of the heel.  No treatment has been done    74 y.o. female presents with the above complaint.   ROS: Denies fever chills nausea vomiting muscle aches pains calf pain back pain chest pain shortness of breath.  Past Medical History:  Diagnosis Date  . Allergic rhinitis   . Allergy   . Arthritis   . Atrophic vaginitis   . Breast cancer ([place])    Right mastectomy  . Diverticulosis   . GERD (gastroesophageal reflux disease)   . H/O: pneumonia    as child-viral pneumonia  . Headache   . History of shingles   . HLD (hyperlipidemia)    pt denies  . IBS (irritable bowel syndrome)   . Osteopenia   . Sarcoidosis   . Sliding hiatal hernia    Past Surgical History:  Procedure Laterality Date  . APPENDECTOMY    . BASAL CELL CARCINOMA EXCISION     scalp  . BASAL CELL CARCINOMA EXCISION  08/2016   nose  . COLONOSCOPY  02/2010   divertics, rpt 5 years  . DEXA  2005   osteopenia, no change from 2003  . DILATION AND CURETTAGE OF UTERUS  10/1999  . LAPAROSCOPIC TOTAL HYSTERECTOMY  10/03   for fibroids/polyp  . MASTECTOMY Right 02/2009   right breast cancer  . POLYPECTOMY    . TOTAL ABDOMINAL HYSTERECTOMY  2003   for fibroids/polyps  . VIDEO BRONCHOSCOPY WITH ENDOBRONCHIAL NAVIGATION N/A 02/01/2017   Procedure: VIDEO BRONCHOSCOPY WITH ENDOBRONCHIAL NAVIGATION;  Surgeon: Melrose Nakayama, MD;  Location: [facility];  Service: Thoracic;  Laterality: N/A;    Current Outpatient Medications:  .  albuterol (PROVENTIL HFA;VENTOLIN HFA) 108 (90 Base) MCG/ACT inhaler, Inhale 2 puffs into the lungs every 4 (four) hours as needed for wheezing or shortness of breath.,  Disp: 1 Inhaler, Rfl: 2 .  calcium-vitamin D (OSCAL WITH D) 500-200 MG-UNIT per tablet, Take 1 tablet by mouth once a week. , Disp: , Rfl:  .  diclofenac sodium (VOLTAREN) 1 % GEL, Apply 2 g topically 3 (three) times daily as needed., Disp: 1 Tube, Rfl: 11 .  FIBER SELECT GUMMIES PO, Take 2 tablets by mouth at bedtime., Disp: , Rfl:  .  fish oil-omega-3 fatty acids 1000 MG capsule, Take 1 g by mouth once a week. , Disp: , Rfl:  .  fluticasone (FLONASE) 50 MCG/ACT nasal spray, Place 2 sprays into both nostrils daily as needed., Disp: 48 mL, Rfl: 2 .  ibuprofen (ADVIL,MOTRIN) 200 MG tablet, Take 400 mg by mouth every 8 (eight) hours as needed. , Disp: , Rfl:  .  methocarbamol (ROBAXIN) 500 MG tablet, TAKE 1 TABLET BY MOUTH EVERY 8 HOURS AS NEEDED FOR MUSCLE SPASM, Disp: 90 tablet, Rfl: 3 .  methotrexate (RHEUMATREX) 2.5 MG tablet, 6 tablets weekly, Disp: , Rfl:  .  Multiple Vitamin (MULTIVITAMIN WITH MINERALS) TABS tablet, Take 1 tablet by mouth at bedtime., Disp: , Rfl:  .  neomycin-polymyxin-dexameth (MAXITROL) 0.1 % OINT, Place 1 application into both eyes See admin instructions. PRN: TWICE DAILY X1 WEEK & THEN DECREASE TO ONCE  DAILY FOR X2 WEEKS, Disp: , Rfl:  .  PREDNISONE PO, Take 15 mg by mouth daily with breakfast. Take 55m by mouth daily, Disp: , Rfl:   Allergies  Allergen Reactions  . Niacin And Related Other (See Comments)    Caused black and blue marks all over body  . Meloxicam Rash   Review of Systems Objective:  There were no vitals filed for this visit.  General: Well developed, nourished, in no acute distress, alert and oriented x3   Dermatological: Skin is warm, dry and supple bilateral. Nails x 10 are well maintained; remaining integument appears unremarkable at this time. There are no open sores, no preulcerative lesions, no rash or signs of infection present.  Vascular: Dorsalis Pedis artery and Posterior Tibial artery pedal pulses are 2/4 bilateral with immedate  capillary fill time. Pedal hair growth present. No varicosities and no lower extremity edema present bilateral.   Neruologic: Grossly intact via light touch bilateral. Vibratory intact via tuning fork bilateral. Protective threshold with Semmes Wienstein monofilament intact to all pedal sites bilateral. Patellar and Achilles deep tendon reflexes 2+ bilateral. No Babinski or clonus noted bilateral.   Musculoskeletal: No gross boney pedal deformities bilateral. No pain, crepitus, or limitation noted with foot and ankle range of motion bilateral. Muscular strength 5/5 in all groups tested bilateral.  She has pain on palpation insertion site of the Achilles left and pain on palpation medial calcaneal tubercle left  Gait: Unassisted, Nonantalgic.    Radiographs:  Reviewed radiographs demonstrating retrocalcaneal heel spur and plantar fasciitis.  Assessment & Plan:   Assessment: Plan fasciitis left insertional Achilles tendinitis bursitis left  Plan: Injected the area today with dexamethasone posteriorly 2 mg and local anesthetic 20 mg of Kenalog and 5 mg of Marcaine injected plantar fascial calcaneal insertion site.     Juvencio Verdi T. [place]

## 2020-03-18 ENCOUNTER — Other Ambulatory Visit: Payer: Self-pay | Admitting: Family Medicine

## 2020-03-18 ENCOUNTER — Telehealth: Payer: Self-pay | Admitting: Family Medicine

## 2020-03-18 DIAGNOSIS — Z789 Other specified health status: Secondary | ICD-10-CM | POA: Insufficient documentation

## 2020-03-18 DIAGNOSIS — D8685 Sarcoid myocarditis: Secondary | ICD-10-CM | POA: Diagnosis not present

## 2020-03-18 DIAGNOSIS — Z1152 Encounter for screening for COVID-19: Secondary | ICD-10-CM

## 2020-03-18 NOTE — Telephone Encounter (Signed)
Patient called.  Patient's requesting getting the covid antibody test. Patient said her Cardiologist at [facility] recommended it before she travels.

## 2020-03-18 NOTE — Telephone Encounter (Signed)
I ordered the test Let me know if this is not the correct order She is immunized but also immunocompromised   Please schedule a lab appt

## 2020-03-18 NOTE — Telephone Encounter (Signed)
Lab appt scheduled.

## 2020-03-19 ENCOUNTER — Other Ambulatory Visit: Payer: Self-pay

## 2020-03-19 ENCOUNTER — Other Ambulatory Visit (INDEPENDENT_AMBULATORY_CARE_PROVIDER_SITE_OTHER): Payer: Medicare HMO

## 2020-03-19 DIAGNOSIS — Z1152 Encounter for screening for COVID-19: Secondary | ICD-10-CM | POA: Diagnosis not present

## 2020-03-20 LAB — SARS-COV-2 ANTIBODY(IGG)SPIKE,SEMI-QUANTITATIVE: SARS COV1 AB(IGG)SPIKE,SEMI QN: 1.37 index — ABNORMAL HIGH (ref <1.00)

## 2020-03-27 DIAGNOSIS — D490 Neoplasm of unspecified behavior of digestive system: Secondary | ICD-10-CM | POA: Diagnosis not present

## 2020-03-27 DIAGNOSIS — K862 Cyst of pancreas: Secondary | ICD-10-CM | POA: Diagnosis not present

## 2020-04-01 DIAGNOSIS — G4733 Obstructive sleep apnea (adult) (pediatric): Secondary | ICD-10-CM | POA: Diagnosis not present

## 2020-04-03 DIAGNOSIS — D869 Sarcoidosis, unspecified: Secondary | ICD-10-CM | POA: Diagnosis not present

## 2020-04-11 DIAGNOSIS — D8685 Sarcoid myocarditis: Secondary | ICD-10-CM | POA: Diagnosis not present

## 2020-04-22 DIAGNOSIS — D869 Sarcoidosis, unspecified: Secondary | ICD-10-CM | POA: Diagnosis not present

## 2020-04-22 DIAGNOSIS — D8685 Sarcoid myocarditis: Secondary | ICD-10-CM | POA: Diagnosis not present

## 2020-04-26 DIAGNOSIS — D490 Neoplasm of unspecified behavior of digestive system: Secondary | ICD-10-CM | POA: Diagnosis not present

## 2020-04-26 DIAGNOSIS — D8685 Sarcoid myocarditis: Secondary | ICD-10-CM | POA: Diagnosis not present

## 2020-05-03 DIAGNOSIS — D8685 Sarcoid myocarditis: Secondary | ICD-10-CM | POA: Diagnosis not present

## 2020-05-04 DIAGNOSIS — D869 Sarcoidosis, unspecified: Secondary | ICD-10-CM | POA: Diagnosis not present

## 2020-05-09 DIAGNOSIS — D8685 Sarcoid myocarditis: Secondary | ICD-10-CM | POA: Diagnosis not present

## 2020-05-09 DIAGNOSIS — D490 Neoplasm of unspecified behavior of digestive system: Secondary | ICD-10-CM | POA: Diagnosis not present

## 2020-05-27 DIAGNOSIS — D8685 Sarcoid myocarditis: Secondary | ICD-10-CM | POA: Diagnosis not present

## 2020-05-29 ENCOUNTER — Ambulatory Visit (INDEPENDENT_AMBULATORY_CARE_PROVIDER_SITE_OTHER): Payer: Medicare HMO

## 2020-05-29 ENCOUNTER — Other Ambulatory Visit: Payer: Self-pay

## 2020-05-29 DIAGNOSIS — H2513 Age-related nuclear cataract, bilateral: Secondary | ICD-10-CM | POA: Diagnosis not present

## 2020-05-29 DIAGNOSIS — Z23 Encounter for immunization: Secondary | ICD-10-CM

## 2020-05-30 DIAGNOSIS — K862 Cyst of pancreas: Secondary | ICD-10-CM | POA: Diagnosis not present

## 2020-06-03 DIAGNOSIS — D869 Sarcoidosis, unspecified: Secondary | ICD-10-CM | POA: Diagnosis not present

## 2020-06-05 DIAGNOSIS — Z03818 Encounter for observation for suspected exposure to other biological agents ruled out: Secondary | ICD-10-CM | POA: Diagnosis not present

## 2020-06-20 ENCOUNTER — Encounter: Payer: Self-pay | Admitting: Adult Health

## 2020-06-20 ENCOUNTER — Other Ambulatory Visit
Admission: RE | Admit: 2020-06-20 | Discharge: 2020-06-20 | Disposition: A | Discharge status: Home / Self Care | Payer: Medicare HMO | Source: Home / Self Care | Attending: Adult Health | Admitting: Adult Health

## 2020-06-20 ENCOUNTER — Other Ambulatory Visit: Payer: Self-pay

## 2020-06-20 ENCOUNTER — Telehealth: Payer: Self-pay | Admitting: Adult Health

## 2020-06-20 ENCOUNTER — Ambulatory Visit: Payer: Medicare HMO | Admitting: Adult Health

## 2020-06-20 ENCOUNTER — Ambulatory Visit
Admission: RE | Admit: 2020-06-20 | Discharge: 2020-06-20 | Disposition: A | Discharge status: Home / Self Care | Payer: Medicare HMO | Attending: Adult Health | Admitting: Adult Health

## 2020-06-20 ENCOUNTER — Ambulatory Visit
Admission: RE | Admit: 2020-06-20 | Discharge: 2020-06-20 | Disposition: A | Discharge status: Home / Self Care | Payer: Medicare HMO | Source: Ambulatory Visit | Attending: Adult Health | Admitting: Adult Health

## 2020-06-20 VITALS — BP 128/74 | HR 100 | Temp 97.3°F | Ht 60.5 in | Wt 157.6 lb

## 2020-06-20 DIAGNOSIS — G4733 Obstructive sleep apnea (adult) (pediatric): Secondary | ICD-10-CM | POA: Diagnosis not present

## 2020-06-20 DIAGNOSIS — R0600 Dyspnea, unspecified: Secondary | ICD-10-CM

## 2020-06-20 DIAGNOSIS — R0609 Other forms of dyspnea: Secondary | ICD-10-CM

## 2020-06-20 DIAGNOSIS — R0602 Shortness of breath: Secondary | ICD-10-CM | POA: Insufficient documentation

## 2020-06-20 DIAGNOSIS — D869 Sarcoidosis, unspecified: Secondary | ICD-10-CM

## 2020-06-20 DIAGNOSIS — J9611 Chronic respiratory failure with hypoxia: Secondary | ICD-10-CM | POA: Insufficient documentation

## 2020-06-20 HISTORY — DX: Obstructive sleep apnea (adult) (pediatric): G47.33

## 2020-06-20 LAB — CBC WITH DIFFERENTIAL/PLATELET
Abs Immature Granulocytes: 0.17 10*3/uL — ABNORMAL HIGH (ref 0.00–0.07)
Basophils Absolute: 0 10*3/uL (ref 0.0–0.1)
Basophils Relative: 0 %
Eosinophils Absolute: 0 10*3/uL (ref 0.0–0.5)
Eosinophils Relative: 0 %
HCT: 44.9 % (ref 36.0–46.0)
Hemoglobin: 15 g/dL (ref 12.0–15.0)
Immature Granulocytes: 2 %
Lymphocytes Relative: 6 %
Lymphs Abs: 0.7 10*3/uL (ref 0.7–4.0)
MCH: 32.9 pg (ref 26.0–34.0)
MCHC: 33.4 g/dL (ref 30.0–36.0)
MCV: 98.5 fL (ref 80.0–100.0)
Monocytes Absolute: 0.4 10*3/uL (ref 0.1–1.0)
Monocytes Relative: 4 %
Neutro Abs: 10.2 10*3/uL — ABNORMAL HIGH (ref 1.7–7.7)
Neutrophils Relative %: 88 %
Platelets: 342 10*3/uL (ref 150–400)
RBC: 4.56 MIL/uL (ref 3.87–5.11)
RDW: 14.4 % (ref 11.5–15.5)
WBC: 11.5 10*3/uL — ABNORMAL HIGH (ref 4.0–10.5)
nRBC: 0 % (ref 0.0–0.2)

## 2020-06-20 LAB — FIBRIN DERIVATIVES D-DIMER (ARMC ONLY): Fibrin derivatives D-dimer (ARMC): 402.64 ng/mL (FEU) (ref 0.00–499.00)

## 2020-06-20 LAB — BASIC METABOLIC PANEL
Anion gap: 12 (ref 5–15)
BUN: 25 mg/dL — ABNORMAL HIGH (ref 8–23)
CO2: 23 mmol/L (ref 22–32)
Calcium: 9.2 mg/dL (ref 8.9–10.3)
Chloride: 105 mmol/L (ref 98–111)
Creatinine, Ser: 0.84 mg/dL (ref 0.44–1.00)
GFR, Estimated: >60 mL/min (ref >60)
Glucose, Bld: 136 mg/dL — ABNORMAL HIGH (ref 70–99)
Potassium: 4.7 mmol/L (ref 3.5–5.1)
Sodium: 140 mmol/L (ref 135–145)

## 2020-06-20 LAB — BRAIN NATRIURETIC PEPTIDE: B Natriuretic Peptide: 30.7 pg/mL (ref 0.0–100.0)

## 2020-06-20 MED ORDER — AZITHROMYCIN 250 MG PO TABS: ORAL_TABLET | ORAL | 0 refills | Status: AC

## 2020-06-20 NOTE — Telephone Encounter (Signed)
Spoke with Margie in [place] who states that the order has now been signed and CXR has been completed. Nothing further needed at this time.

## 2020-06-20 NOTE — Addendum Note (Signed)
Addended by: Melvenia Needles on: 06/20/2020 04:28 PM   Modules accepted: Orders

## 2020-06-20 NOTE — Assessment & Plan Note (Signed)
Known chronic respiratory failure on oxygen at bedtime with CPAP.  Now with exertional hypoxemia questionable etiology.  This began around 3 to 4 weeks ago.  Will require oxygen 2 L with activity Check chest x-ray today.  D-dimer.  If D-dimer is positive will need a CT with PE protocol if renal function allows  Plan  Patient Instructions  Chest xray today .  Labs today.  Albuterol As needed   Increase Prednisone 36m daily for 1 week , then 120mdaily -hold at this dose.  Follow up with Cardiology as planned.  Begin Oxygen 2l/m with activity .  Continue on CPAP At bedtime with oxygen Follow up with Dr. KaMortimer Friesn 2-3 weeks and As needed   Please contact office for sooner follow up if symptoms do not improve or worsen or seek emergency care

## 2020-06-20 NOTE — Progress Notes (Signed)
@Patient  ID: Judith [name], female    DOB: 09/04/1945, 74 y.o.   MRN: [id number]  Chief Complaint  Patient presents with  . Follow-up    Sarcoid /OSA     Referring provider: Tower, Wynelle Fanny, MD  HPI: 74 year old female followed for sarcoidosis with multisystem involvement including cutaneous, cardiac, pulmonary.  She has obstructive sleep apnea on nocturnal CPAP with oxygen. History of right-sided breast cancer status postmastectomy and radiation 2001/2009  She is followed by [facility] cardiology and pulmonology  TEST/EVENTS :  In April 2020, she had a cardiac MRI that showed evidence of cardiac sarcoidosis with hyperenhancement in the basal anteroseptum extending into the infunZdibular RV free wall and a total scar burden estimated at 7%.  May 2020 cardiac PET/CT showed active inflammation in the septal wall with additional hypermetabolic areas at the right and left hilar nodes, peribronchovascular nodular opacities in 3 liver lesions.  Bronchoscopy with biopsy June 2020 showed crushed lymphoid aggregates with possible granulomas concerning for sarcoidosis  06/20/2020 Follow up : Pulmonary sarcoidosis and obstructive sleep apnea Patient returns for a 65-monthfollow-up.  Patient has known sarcoidosis with multisystem involvement including cutaneous, cardiac and pulmonary.  She is currently on methotrexate and prednisone.  Current dose of prednisone is at 15 mg. She says she has noticed over the last couple months that her breathing has been getting more short with activity.  She is currently being evaluated by cardiology for possible ICD implantation. Plans for cardiac recorder on 07/01/20.   She has noticed that her oxygen levels have been decreasing with activity.  She is on oxygen at nighttime with her CPAP.  She does have a portable system at home.  And has noticed her O2 sats dipped into the 80s on room air.  Today in the office O2 saturation dropped to 86% walking on room air.  She  required 2 L to maintain O2 saturations greater than 88 to 90%.  Care everywhere notes show CT chest August 01, 2019 showed mild chronic upper lobe predominant parenchymal scarring.  Resolution of right lower lobe groundglass opacities and small groundglass attenuation in the right hilum. PET cardiac with concurrent CT April 22, 2020 showed no evidence of lymphadenopathy.  No pleural effusion.  Bilateral subpleural reticulation.  Unchanged 7 mm right upper lobe pulmonary nodule.  Overall cardiac findings significantly improved compared to prior exam  Symptoms started approximately 3 to 4 weeks ago.  [place] symptoms have been progressively worsening with worsening shortness of breath pleuritic pain on the right.  Minimal cough.  Patient did have a low-grade fever during this timeframe.  COVID-19 testing was negative.  Covid vaccines are up-to-date.  She denies any hemoptysis, exertional chest pain orthopnea or leg swelling.  Patient did have some calf pain x1 day. She says she did miss a methotrexate dose due to her flu shot.  And symptoms seem to have gotten worse during this timeframe.  Patient has underlying obstructive sleep apnea is on nocturnal CPAP.  Says she is doing well on CPAP.  She wears her CPAP every night and never misses any.  She uses it about 8 to 9 hours.  CPAP download shows excellent compliance with daily average usage at 9 hours.  Patient is on auto CPAP 5 to 15 cm H2O.  AHI 0.2.  Allergies  Allergen Reactions  . Niacin And Related Other (See Comments)    Caused black and blue marks  all over body  . Meloxicam Rash    Immunization History  Administered Date(s) Administered  . Fluad Quad(high Dose 65+) 06/20/2019, 05/29/2020  . Influenza Split 06/24/2011, 06/08/2012  . Influenza Whole 07/08/2007, 06/11/2008, 06/19/2009, 06/04/2010  . Influenza,inj,Quad PF,6+ Mos 06/15/2013, 06/21/2014, 07/31/2015, 06/10/2016,  06/15/2017, 05/25/2018  . Influenza,inj,quad, With Preservative 06/29/2017  . PFIZER SARS-COV-2 Vaccination 10/05/2019, 10/30/2019, 04/30/2020  . Pneumococcal Conjugate-13 12/05/2014  . Pneumococcal Polysaccharide-23 03/20/2011  . Td 06/06/2001  . Tdap 03/20/2011    Past Medical History:  Diagnosis Date  . Allergic rhinitis   . Allergy   . Arthritis   . Atrophic vaginitis   . Breast cancer ([place])    Right mastectomy  . Diverticulosis   . GERD (gastroesophageal reflux disease)   . H/O: pneumonia    as child-viral pneumonia  . Headache   . History of shingles   . HLD (hyperlipidemia)    pt denies  . IBS (irritable bowel syndrome)   . OSA (obstructive sleep apnea) 06/20/2020  . Osteopenia   . Sarcoidosis   . Sliding hiatal hernia     Tobacco History: Social History   Tobacco Use  Smoking Status Former Smoker  . Packs/day: 1.00  . Years: 3.00  . Pack years: 3.00  . Types: Cigarettes  . Start date: 58  . Quit date: 08/31/1968  . Years since quitting: 51.8  Smokeless Tobacco Never Used   Counseling given: Not Answered   Outpatient Medications Prior to Visit  Medication Sig Dispense Refill  . albuterol (PROVENTIL HFA;VENTOLIN HFA) 108 (90 Base) MCG/ACT inhaler Inhale 2 puffs into the lungs every 4 (four) hours as needed for wheezing or shortness of breath. 1 Inhaler 2  . calcium-vitamin D (OSCAL WITH D) 500-200 MG-UNIT per tablet Take 1 tablet by mouth once a week.     . cyclobenzaprine (FLEXERIL) 10 MG tablet Take 10 mg by mouth 3 (three) times daily.    . diclofenac sodium (VOLTAREN) 1 % GEL Apply 2 g topically 3 (three) times daily as needed. 1 Tube 11  . erythromycin ophthalmic ointment SMARTSIG:In Eye(s)    . FIBER SELECT GUMMIES PO Take 2 tablets by mouth at bedtime.    . fish oil-omega-3 fatty acids 1000 MG capsule Take 1 g by mouth once a week.     . folic acid (FOLVITE) 1 MG tablet Take 1 mg by mouth daily.    . methocarbamol (ROBAXIN) 500 MG tablet TAKE 1  TABLET BY MOUTH EVERY 8 HOURS AS NEEDED FOR MUSCLE SPASM 90 tablet 3  . methotrexate (RHEUMATREX) 2.5 MG tablet 8 tablets weekly    . Multiple Vitamin (MULTIVITAMIN WITH MINERALS) TABS tablet Take 1 tablet by mouth at bedtime.    Marland Kitchen neomycin-polymyxin-dexameth (MAXITROL) 0.1 % OINT Place 1 application into both eyes See admin instructions. PRN: TWICE DAILY X1 WEEK & THEN DECREASE TO ONCE DAILY FOR X2 WEEKS    . PREDNISONE PO Take 15 mg by mouth daily with breakfast. Take 40m by mouth daily    . fluticasone (FLONASE) 50 MCG/ACT nasal spray Place 2 sprays into both nostrils daily as needed. (Patient not taking: Reported on 06/20/2020) 48 mL 2  . ibuprofen (ADVIL,MOTRIN) 200 MG tablet Take 400 mg by mouth every 8 (eight) hours as needed.  (Patient not taking: Reported on 06/20/2020)     No facility-administered medications prior to visit.     Review of Systems:   Constitutional:   No  weight loss, night sweats,  + Fevers, chills,  fatigue, or  lassitude.  HEENT:   No headaches,  Difficulty swallowing,  Tooth/dental problems, or  Sore throat,                No sneezing, itching, ear ache, nasal congestion, post nasal drip,   CV:  No chest pain,  Orthopnea, PND, swelling in lower extremities, anasarca, dizziness, palpitations, syncope.   GI  No heartburn, indigestion, abdominal pain, nausea, vomiting, diarrhea, change in bowel habits, loss of appetite, bloody stools.   Resp:    No chest wall deformity  Skin: no rash or lesions.  GU: no dysuria, change in color of urine, no urgency or frequency.  No flank pain, no hematuria   MS:  No joint pain or swelling.  No decreased range of motion.  No back pain.    Physical Exam  BP 128/74 (BP Location: Left Arm, Patient Position: Sitting, Cuff Size: Normal)   Pulse 100   Temp (!) 97.3 F (36.3 C) (Temporal)   Ht 5' 0.5" (1.537 m)   Wt 157 lb 9.6 oz (71.5 kg)   SpO2 92%   BMI 30.27 kg/m   GEN: A/Ox3; pleasant , NAD, well nourished      HEENT:  /AT,   NOSE-clear, THROAT-clear, no lesions, no postnasal drip or exudate noted.   NECK:  Supple w/ fair ROM; no JVD; normal carotid impulses w/o bruits; no thyromegaly or nodules palpated; no lymphadenopathy.    RESP  Clear  P & A; w/o, wheezes/ rales/ or rhonchi. no accessory muscle use, no dullness to percussion  CARD:  RRR, no m/r/g, no peripheral edema, pulses intact, no cyanosis or clubbing. Negative Homans' sign  GI:   Soft & nt; nml bowel sounds; no organomegaly or masses detected.   Musco: Warm bil, no deformities or joint swelling noted.   Neuro: alert, no focal deficits noted.    Skin: Warm, no lesions or rashes     BNP No results found for: BNP  ProBNP No results found for: PROBNP  Imaging: No results found.    No flowsheet data found.  No results found for: NITRICOXIDE      Assessment & Plan:   Sarcoidosis Sarcoidosis with multisystem involvement including cutaneous, pulmonary, cardiac Patient appears to be having increased symptoms possibly secondary to underlying sarcoid flare.  This did occur during the time frame where she was off of her methotrexate. We will increase prednisone to 20 mg daily for 1 week and then resume 15 mg daily.  Continue with methotrexate Continue follow-up with [facility]ology at [facility]. Chest x-ray today.  Plan  Patient Instructions  Chest xray today .  Labs today.  Albuterol As needed   Increase Prednisone 66m daily for 1 week , then 144mdaily -hold at this dose.  Follow up with Cardiology as planned.  Begin Oxygen 2l/m with activity .  Continue on CPAP At bedtime with oxygen Follow up with Dr. KaMortimer Friesn 2-3 weeks and As needed   Please contact office for sooner follow up if symptoms do not improve or worsen or seek emergency care       [place] failure on oxygen at bedtime with CPAP.  Now with exertional hypoxemia questionable etiology.  This  began around 3 to 4 weeks ago.  Will require oxygen 2 L with activity Check chest x-ray today.  D-dimer.  If D-dimer is positive will need a CT with PE protocol if renal function allows  Plan  Patient Instructions  Chest xray today .  Labs today.  Albuterol As needed   Increase Prednisone 46m daily for 1 week , then 179mdaily -hold at this dose.  Follow up with Cardiology as planned.  Begin Oxygen 2l/m with activity .  Continue on CPAP At bedtime with oxygen Follow up with Dr. KaMortimer Friesn 2-3 weeks and As needed   Please contact office for sooner follow up if symptoms do not improve or worsen or seek emergency care       Dyspnea Dyspnea worsening with activity over the last 3 to 4 weeks.  Questionable etiology.  Will need further evaluation.  Chest x-ray today.  Along with labs including CBC be met and BNP and D-dimer. If D-dimer is positive will need a CTA with PE protocol.  Plan  Patient Instructions  Chest xray today .  Labs today.  Albuterol As needed   Increase Prednisone 2063maily for 1 week , then 54m41mily -hold at this dose.  Follow up with Cardiology as planned.  Begin Oxygen 2l/m with activity .  Continue on CPAP At bedtime with oxygen Follow up with Dr. KasaMortimer Fries2-3 weeks and As needed   Please contact office for sooner follow up if symptoms do not improve or worsen or seek emergency care       OSA (obstructive sleep apnea) Obstructive sleep apnea with excellent control and compliance on nocturnal CPAP Continue on CPAP with oxygen at bedtime  Plan  Patient Instructions  Chest xray today .  Labs today.  Albuterol As needed   Increase Prednisone 20mg85mly for 1 week , then 54mg 17my -hold at this dose.  Follow up with Cardiology as planned.  Begin Oxygen 2l/m with activity .  Continue on CPAP At bedtime with oxygen Follow up with Dr. Kasa iMortimer Fries3 weeks and As needed   Please contact office for sooner follow up if symptoms do not improve or worsen or seek  emergency care          Kaylan Yates Rexene Edison0/21/2021

## 2020-06-20 NOTE — Assessment & Plan Note (Signed)
Dyspnea worsening with activity over the last 3 to 4 weeks.  Questionable etiology.  Will need further evaluation.  Chest x-ray today.  Along with labs including CBC be met and BNP and D-dimer. If D-dimer is positive will need a CTA with PE protocol.  Plan  Patient Instructions  Chest xray today .  Labs today.  Albuterol As needed   Increase Prednisone 80m daily for 1 week , then 165mdaily -hold at this dose.  Follow up with Cardiology as planned.  Begin Oxygen 2l/m with activity .  Continue on CPAP At bedtime with oxygen Follow up with Judith [name] 2-3 weeks and As needed   Please contact office for sooner follow up if symptoms do not improve or worsen or seek emergency care

## 2020-06-20 NOTE — Assessment & Plan Note (Signed)
Obstructive sleep apnea with excellent control and compliance on nocturnal CPAP Continue on CPAP with oxygen at bedtime  Plan  Patient Instructions  Chest xray today .  Labs today.  Albuterol As needed   Increase Prednisone 5m daily for 1 week , then 18mdaily -hold at this dose.  Follow up with Cardiology as planned.  Begin Oxygen 2l/m with activity .  Continue on CPAP At bedtime with oxygen Follow up with Dr. KaMortimer Friesn 2-3 weeks and As needed   Please contact office for sooner follow up if symptoms do not improve or worsen or seek emergency care

## 2020-06-20 NOTE — Assessment & Plan Note (Signed)
Sarcoidosis with multisystem involvement including cutaneous, pulmonary, cardiac Patient appears to be having increased symptoms possibly secondary to underlying sarcoid flare.  This did occur during the time frame where she was off of her methotrexate. We will increase prednisone to 20 mg daily for 1 week and then resume 15 mg daily.  Continue with methotrexate Continue follow-up with cardiology at [facility]. Chest x-ray today.  Plan  Patient Instructions  Chest xray today .  Labs today.  Albuterol As needed   Increase Prednisone 92m daily for 1 week , then 15mdaily -hold at this dose.  Follow up with Cardiology as planned.  Begin Oxygen 2l/m with activity .  Continue on CPAP At bedtime with oxygen Follow up with Dr. KaMortimer Friesn 2-3 weeks and As needed   Please contact office for sooner follow up if symptoms do not improve or worsen or seek emergency care

## 2020-06-20 NOTE — Patient Instructions (Addendum)
Chest xray today .  Labs today.  Albuterol As needed   Increase Prednisone 58m daily for 1 week , then 136mdaily -hold at this dose.  Follow up with Cardiology as planned.  Begin Oxygen 2l/m with activity .  Continue on CPAP At bedtime with oxygen Follow up with Dr. KaMortimer Friesn 2-3 weeks and As needed   Please contact office for sooner follow up if symptoms do not improve or worsen or seek emergency care

## 2020-07-01 DIAGNOSIS — I493 Ventricular premature depolarization: Secondary | ICD-10-CM | POA: Diagnosis not present

## 2020-07-01 DIAGNOSIS — Z87898 Personal history of other specified conditions: Secondary | ICD-10-CM | POA: Diagnosis not present

## 2020-07-01 DIAGNOSIS — Z01818 Encounter for other preprocedural examination: Secondary | ICD-10-CM | POA: Diagnosis not present

## 2020-07-01 DIAGNOSIS — R002 Palpitations: Secondary | ICD-10-CM | POA: Diagnosis not present

## 2020-07-01 DIAGNOSIS — I472 Ventricular tachycardia: Secondary | ICD-10-CM | POA: Diagnosis not present

## 2020-07-02 DIAGNOSIS — G4733 Obstructive sleep apnea (adult) (pediatric): Secondary | ICD-10-CM | POA: Diagnosis not present

## 2020-07-04 DIAGNOSIS — D869 Sarcoidosis, unspecified: Secondary | ICD-10-CM | POA: Diagnosis not present

## 2020-07-22 ENCOUNTER — Ambulatory Visit: Payer: Medicare HMO | Admitting: Primary Care

## 2020-07-22 ENCOUNTER — Other Ambulatory Visit: Payer: Self-pay

## 2020-07-22 ENCOUNTER — Encounter: Payer: Self-pay | Admitting: Primary Care

## 2020-07-22 VITALS — BP 128/78 | HR 91 | Temp 97.8°F | Ht 60.5 in | Wt 159.6 lb

## 2020-07-22 DIAGNOSIS — G4733 Obstructive sleep apnea (adult) (pediatric): Secondary | ICD-10-CM

## 2020-07-22 DIAGNOSIS — D869 Sarcoidosis, unspecified: Secondary | ICD-10-CM | POA: Diagnosis not present

## 2020-07-22 DIAGNOSIS — J9611 Chronic respiratory failure with hypoxia: Secondary | ICD-10-CM

## 2020-07-22 NOTE — Assessment & Plan Note (Addendum)
-   Patient is 100% compliant with CPAP. She reports feeling bloated after wearing CPAP. Pressure 5-15cm h20; residual AHI 0.3. Plan decreased cpap pressure to 5-13cm h20. FU in 6 months

## 2020-07-22 NOTE — Progress Notes (Signed)
@Patient  ID: Judith [name], female    DOB: [birth year], 74 y.o.   MRN: [id number]  Chief Complaint  Patient presents with  . Follow-up    breathing has improved since last OV. c/o sob with exertion and occ wheezing. wearing cpap avg 8-9hr nightly-- feels pressure and mask is okay.     Referring provider: Tower, Wynelle Fanny, MD  HPI: 74 year old female, former smoker quit 1970 (3-pack-year history).  Past medical history significant for multisystem sarcoid and OSA.  Patient of Mortimer Fries, last seen by pulmonary nurse practitioner 06/20/20 sarcoidosis flare.  Treated with prednisone 20 mg daily x1 week and Zpack. CXR showed no evidence of pneumonia, chronic sarcoid.  She is followed by [facility].  Maintained on methotrexate and 14m prednisone daily.  07/22/2020 Patient presents today for 1 month follow-up sarcoidosis flare. She is feeling significantly better. She completed course of azithromycin and stayed on higher dose of prednisone for an additional week or two until her cough resolve. [facility] She resumed 158mdaily last week. Her breathing, cough and heart rate have improved. She still has baseline dyspnea with activity. She can walk around her house without breathing difficulties. Her O2 has been sustaining >90% on room air. At rest her O2 has been 95% or higher which is an improvement for her. She is 100% complaint with CPAP use with 2L oxygen. She monitors her oxygen level via her watch and reports several nights her O2 level will fall below 88%. Her methotrexate was increased in August 2021. She had a heart monitor placed by [facility] in September 2021 d/t cardiac sarcoidosis. Denies active f/c/s, cough, chest tightness or chest pain.   Airview download 06/18/20-07/17/20: 30/30 days used (100%) Average usage 9 hours 15 mins Pressure 5-15cm h20 (10.9cm h20-95%) AHI 0.3  Allergies  Allergen Reactions  . Niacin And Related Other (See Comments)    Caused black  and blue marks all over body  . Meloxicam Rash    Immunization History  Administered Date(s) Administered  . Fluad Quad(high Dose 65+) 06/20/2019, 05/29/2020  . Influenza Split 06/24/2011, 06/08/2012  . Influenza Whole 07/08/2007, 06/11/2008, 06/19/2009, 06/04/2010  . Influenza,inj,Quad PF,6+ Mos 06/15/2013, 06/21/2014, 07/31/2015, 06/10/2016, 06/15/2017, 05/25/2018  . Influenza,inj,quad, With Preservative 06/29/2017  . PFIZER SARS-COV-2 Vaccination 10/05/2019, 10/30/2019, 04/30/2020  . Pneumococcal Conjugate-13 12/05/2014  . Pneumococcal Polysaccharide-23 03/20/2011  . Td 06/06/2001  . Tdap 03/20/2011    Past Medical History:  Diagnosis Date  . Allergic rhinitis   . Allergy   . Arthritis   . Atrophic vaginitis   . [place]   Right m[place]ctomy  . Diverticulosis   . GERD (gastroesophageal reflux disease)   . H/O: pneumonia    as child-viral pneumonia  . Headache   . History of shingles   . HLD (hyperlipidemia)    pt denies  . IBS (irritable bowel syndrome)   . OSA (obstructive sleep apnea) 06/20/2020  . Osteopenia   . Sarcoidosis   . Sliding hiatal hernia     Tobacco History: Social History   Tobacco Use  Smoking Status Former Smoker  . Packs/day: 1.00  . Years: 3.00  . Pack years: 3.00  . Types: Cigarettes  . Start date: 1972. Quit date: 08/31/1968  . Years since quitting: 51.9  Smokeless Tobacco Never Used   Counseling given: Not Answered   Outpatient Medications Prior to Visit  Medication Sig Dispense Refill  . albuterol (PROVENTIL HFA;VENTOLIN HFA) 108 (90  Base) MCG/ACT inhaler Inhale 2 puffs into the lungs every 4 (four) hours as needed for wheezing or shortness of breath. 1 Inhaler 2  . calcium-vitamin D (OSCAL WITH D) 500-200 MG-UNIT per tablet Take 1 tablet by mouth once a week.     . cyclobenzaprine (FLEXERIL) 10 MG tablet Take 10 mg by mouth 3 (three) times daily.    . diclofenac sodium (VOLTAREN) 1 % GEL Apply 2 g topically 3 (three)  times daily as needed. 1 Tube 11  . erythromycin ophthalmic ointment SMARTSIG:In Eye(s)    . FIBER SELECT GUMMIES PO Take 2 tablets by mouth at bedtime.    . fish oil-omega-3 fatty acids 1000 MG capsule Take 1 g by mouth once a week.     . fluticasone (FLONASE) 50 MCG/ACT nasal spray Place 2 sprays into both nostrils daily as needed. 48 mL 2  . folic acid (FOLVITE) 1 MG tablet Take 1 mg by mouth daily.    Marland Kitchen ibuprofen (ADVIL,MOTRIN) 200 MG tablet Take 400 mg by mouth every 8 (eight) hours as needed.     . methocarbamol (ROBAXIN) 500 MG tablet TAKE 1 TABLET BY MOUTH EVERY 8 HOURS AS NEEDED FOR MUSCLE SPASM 90 tablet 3  . methotrexate (RHEUMATREX) 2.5 MG tablet 8 tablets weekly    . Multiple Vitamin (MULTIVITAMIN WITH MINERALS) TABS tablet Take 1 tablet by mouth at bedtime.    Marland Kitchen neomycin-polymyxin-dexameth (MAXITROL) 0.1 % OINT Place 1 application into both eyes See admin instructions. PRN: TWICE DAILY X1 WEEK & THEN DECREASE TO ONCE DAILY FOR X2 WEEKS    . PREDNISONE PO Take 15 mg by mouth daily with breakfast. Take 81m by mouth daily     No facility-administered medications prior to visit.    Review of Systems  Review of Systems  Constitutional: Negative.   HENT: Negative.   Respiratory: Negative for cough, shortness of breath and wheezing.        Baseline doe   Cardiovascular: Negative.    Physical Exam  BP 128/78 (BP Location: Left Arm, Cuff Size: Normal)   Pulse 91   Temp 97.8 F (36.6 C) (Temporal)   Ht 5' 0.5" (1.537 m)   Wt 159 lb 9.6 oz (72.4 kg)   SpO2 95%   BMI 30.66 kg/m  Physical Exam Constitutional:      Appearance: Normal appearance.  HENT:     Head: Normocephalic and atraumatic.     Mouth/Throat:     Mouth: Mucous membranes are moist.     Pharynx: Oropharynx is clear.  Cardiovascular:     Rate and Rhythm: Normal rate and regular rhythm.  Pulmonary:     Effort: Pulmonary effort is normal.     Breath sounds: Normal breath sounds. No wheezing, rhonchi or  rales.     Comments: CTA Musculoskeletal:        General: Normal range of motion.  Skin:    General: Skin is warm and dry.  Neurological:     Mental Status: She is alert.  Psychiatric:        Mood and Affect: Mood normal.        Behavior: Behavior normal.        Thought Content: Thought content normal.        Judgment: Judgment normal.      Lab Results:  CBC    Component Value Date/Time   WBC 11.5 (H) 06/20/2020 1309   RBC 4.56 06/20/2020 1309   HGB 15.0 06/20/2020 1309   HCT  44.9 06/20/2020 1309   PLT 342 06/20/2020 1309   MCV 98.5 06/20/2020 1309   MCH 32.9 06/20/2020 1309   MCHC 33.4 06/20/2020 1309   RDW 14.4 06/20/2020 1309   LYMPHSABS 0.7 06/20/2020 1309   MONOABS 0.4 06/20/2020 1309   EOSABS 0.0 06/20/2020 1309   BASOSABS 0.0 06/20/2020 1309    BMET    Component Value Date/Time   NA 140 06/20/2020 1309   K 4.7 06/20/2020 1309   CL 105 06/20/2020 1309   CO2 23 06/20/2020 1309   GLUCOSE 136 (H) 06/20/2020 1309   BUN 25 (H) 06/20/2020 1309   CREATININE 0.84 06/20/2020 1309   CALCIUM 9.2 06/20/2020 1309   GFRNONAA >60 06/20/2020 1309   GFRAA >60 01/29/2017 1402    BNP    Component Value Date/Time   BNP 30.7 06/20/2020 1309    ProBNP No results found for: PROBNP  Imaging: No results found.   Assessment & Plan:   OSA (obstructive sleep apnea) - Patient is 100% compliant with CPAP. She reports feeling bloated after wearing CPAP. Pressure 5-15cm h20; residual AHI 0.3. Plan decreased cpap pressure to 5-13cm h20. FU in 6 months   Chronic respiratory failure with hypoxia ([place]) - Patient reports several nights her O2 has dropped below 88% 2l per her fitbit watch  - Recommend checking ONO on CPAP/2L oxygen  Sarcoidosis - Treated for sarcoidosis flare in October 2021, symptoms improved after completed course of Azithromycin and increasing her prednisone to 40m x 2-3 weeks - Continue Methotrexate and prednisone 159mdaily - Follows with  DuFrancee GentileNP 07/22/2020

## 2020-07-22 NOTE — Assessment & Plan Note (Signed)
-   Patient reports several nights her O2 has dropped below 88% 2l per her fitbit watch  - Recommend checking ONO on CPAP/2L oxygen

## 2020-07-22 NOTE — Assessment & Plan Note (Signed)
-   Treated for sarcoidosis flare in October 2021, symptoms improved after completed course of Azithromycin and increasing her prednisone to 73m x 2-3 weeks - Continue Methotrexate and prednisone 185mdaily - Follows with [facility]

## 2020-07-22 NOTE — Patient Instructions (Addendum)
Sarcoid: - Continue 22m prednisone daily - Continue methotrexate as prescribed   Sleep apnea: - Continue to wear CPAP every night with 2L oxygen  - Do not drive if experiencing excessive daytime fatigue or somnolence  Orders: - Change CPAP to 5-13cm h20  - ONO on CPAP and 2L oxygen  Follow-up: - 6 months with Dr. KMortimer Fries

## 2020-07-24 ENCOUNTER — Encounter: Payer: Self-pay | Admitting: Primary Care

## 2020-07-29 DIAGNOSIS — J9610 Chronic respiratory failure, unspecified whether with hypoxia or hypercapnia: Secondary | ICD-10-CM | POA: Diagnosis not present

## 2020-08-02 DIAGNOSIS — C50911 Malignant neoplasm of unspecified site of right female breast: Secondary | ICD-10-CM | POA: Diagnosis not present

## 2020-08-02 NOTE — Progress Notes (Signed)
ONO 07/24/20 on CPAP/2L oxygen showed basal O2 93% with SpO2 86%. Patient spent 11 mins with O2 <88%. Recommend patient increasing oxygen to 2.5-3L. She feels less bloated since lowering CPAP pressure. No significant residual events.

## 2020-08-03 DIAGNOSIS — D869 Sarcoidosis, unspecified: Secondary | ICD-10-CM | POA: Diagnosis not present

## 2020-08-05 ENCOUNTER — Encounter: Payer: Self-pay | Admitting: Podiatry

## 2020-08-05 ENCOUNTER — Ambulatory Visit: Payer: Medicare HMO | Admitting: Podiatry

## 2020-08-05 ENCOUNTER — Other Ambulatory Visit: Payer: Self-pay

## 2020-08-05 DIAGNOSIS — M722 Plantar fascial fibromatosis: Secondary | ICD-10-CM

## 2020-08-05 DIAGNOSIS — M7662 Achilles tendinitis, left leg: Secondary | ICD-10-CM | POA: Diagnosis not present

## 2020-08-05 MED ORDER — DEXAMETHASONE SODIUM PHOSPHATE 120 MG/30ML IJ SOLN
2.0000 mg | Freq: Once | INTRAMUSCULAR | Status: AC
  Administered 2020-08-05: 2 mg via INTRA_ARTICULAR

## 2020-08-05 NOTE — Progress Notes (Signed)
She presents today for follow-up of her heel pain.  States that the fasciitis is doing fine but the back of the heel is still exquisitely tender she refers to the left.  Objective: Vital signs are stable alert oriented x3 she has no pain on palpation medial calcaneal tubercle of the right heel though she does have an area of fluctuance on the posterior superior aspect of the calcaneus.  There is no area of hypertrophy of the skin associated with this.  Is just an area of fluctuance.  Assessment: Adventitial bursitis retro-Achilles bursitis.  Resolved plantar fasciitis left foot.  Plan: I injected subcutaneously into the bursa today dexamethasone and local anesthetic.  I will follow-up with her as needed.  May need to consider MRI and physical therapy.

## 2020-08-06 DIAGNOSIS — D8685 Sarcoid myocarditis: Secondary | ICD-10-CM | POA: Diagnosis not present

## 2020-08-15 DIAGNOSIS — D2272 Melanocytic nevi of left lower limb, including hip: Secondary | ICD-10-CM | POA: Diagnosis not present

## 2020-08-15 DIAGNOSIS — Z85820 Personal history of malignant melanoma of skin: Secondary | ICD-10-CM | POA: Diagnosis not present

## 2020-08-15 DIAGNOSIS — D225 Melanocytic nevi of trunk: Secondary | ICD-10-CM | POA: Diagnosis not present

## 2020-08-15 DIAGNOSIS — D2261 Melanocytic nevi of right upper limb, including shoulder: Secondary | ICD-10-CM | POA: Diagnosis not present

## 2020-08-15 DIAGNOSIS — X32XXXA Exposure to sunlight, initial encounter: Secondary | ICD-10-CM | POA: Diagnosis not present

## 2020-08-15 DIAGNOSIS — L570 Actinic keratosis: Secondary | ICD-10-CM | POA: Diagnosis not present

## 2020-08-15 DIAGNOSIS — Z85828 Personal history of other malignant neoplasm of skin: Secondary | ICD-10-CM | POA: Diagnosis not present

## 2020-08-16 DIAGNOSIS — D869 Sarcoidosis, unspecified: Secondary | ICD-10-CM | POA: Diagnosis not present

## 2020-08-16 DIAGNOSIS — D860 Sarcoidosis of lung: Secondary | ICD-10-CM | POA: Diagnosis not present

## 2020-08-16 DIAGNOSIS — D8685 Sarcoid myocarditis: Secondary | ICD-10-CM | POA: Diagnosis not present

## 2020-08-16 DIAGNOSIS — J849 Interstitial pulmonary disease, unspecified: Secondary | ICD-10-CM | POA: Diagnosis not present

## 2020-08-16 DIAGNOSIS — R918 Other nonspecific abnormal finding of lung field: Secondary | ICD-10-CM | POA: Diagnosis not present

## 2020-08-16 DIAGNOSIS — R0602 Shortness of breath: Secondary | ICD-10-CM | POA: Diagnosis not present

## 2020-09-03 DIAGNOSIS — D869 Sarcoidosis, unspecified: Secondary | ICD-10-CM | POA: Diagnosis not present

## 2020-09-23 DIAGNOSIS — D8685 Sarcoid myocarditis: Secondary | ICD-10-CM | POA: Diagnosis not present

## 2020-10-03 DIAGNOSIS — G4733 Obstructive sleep apnea (adult) (pediatric): Secondary | ICD-10-CM | POA: Diagnosis not present

## 2020-10-04 DIAGNOSIS — D869 Sarcoidosis, unspecified: Secondary | ICD-10-CM | POA: Diagnosis not present

## 2020-10-08 ENCOUNTER — Encounter: Payer: Self-pay | Admitting: Gastroenterology

## 2020-10-17 ENCOUNTER — Telehealth: Payer: Self-pay | Admitting: *Deleted

## 2020-10-17 NOTE — Telephone Encounter (Addendum)
Mrs. Bosko call the triage line stating she and her husband tested positive for Covid today with a home test.  They found out they were exposed on Saturday while playing Bridge with friends. She is on Methotrexate.  Her current symptoms are body aches, fever, sore throat and headache.  She is wanting to know if there is any medication that can be given.  I advised I would send Dr. Glori Bickers a message since she is immunocompromised to see if she might qualify for any type of covid infusion/treatment.  Patient is aware that she needs to quarantine for 5 days.

## 2020-10-17 NOTE — Telephone Encounter (Signed)
Patient scheduled virtual appointment with Dr.Tower 12/16/20 at 3:30.

## 2020-10-17 NOTE — Telephone Encounter (Signed)
Please schedule virtual visit with first available

## 2020-10-18 ENCOUNTER — Encounter: Payer: Self-pay | Admitting: Family Medicine

## 2020-10-18 ENCOUNTER — Other Ambulatory Visit: Payer: Self-pay

## 2020-10-18 ENCOUNTER — Telehealth (INDEPENDENT_AMBULATORY_CARE_PROVIDER_SITE_OTHER): Payer: Medicare HMO | Admitting: Family Medicine

## 2020-10-18 DIAGNOSIS — U071 COVID-19: Secondary | ICD-10-CM

## 2020-10-18 NOTE — Patient Instructions (Signed)
Continue fluids and rest  Monitor temperature, pulse ox and breathing If suddenly worse/short of breath go to the ER Continue your current medicines  Hold methotrexate and contact your pulmonologist  Tylenol is ok  Use your inhaler if you wheeze I placed a referral for covid treatment (like infusion) and you will get a call about that   Please keep [place] posted

## 2020-10-18 NOTE — Progress Notes (Signed)
Virtual Visit via Video Note  I connected with [place] on 10/18/20 at  3:30 PM EST by a video enabled telemedicine application and verified that I am speaking with the correct person using two identifiers.  Location: Patient: home Provider: office   I discussed the limitations of evaluation and management by telemedicine and the availability of in person appointments. The patient expressed understanding and agreed to proceed.  Parties involved in encounter  Patient: Judith [name]  Provider:  Loura Pardon MD   History of Present Illness: Pt presents for positive covid test   Was exposed on Saturday playing bridge with friends Is on methotrexate for pulmonary and cardiac sarcoidosis  Also prednisone 15 mg daily   Symptoms started wed night  Home test the next day - she and husband were both positive   Today  Woke up with a fever- took tylenol  100.5 and then 99.0 Temp is normal now  Diarrhea  achey muscles  Cough- mostly dry, occ prod - mucous (? Color) thinks clear  Last night her pulse ox did go below 88% (she used 02 this am and uses prn)  No wheezing  ST- improved   Resting heart rate was 118 (high)  She contacted her cardiologist Drinking water    immunized with booster   otc  Tylenol  Not needing inhaler-has albuterol if she needs   Patient Active Problem List   Diagnosis Date Noted  . COVID-19 10/18/2020  . Chronic respiratory failure with hypoxia ([place]) 06/20/2020  . OSA (obstructive sleep apnea) 06/20/2020  . Unknown status of immunity to COVID-19 virus 03/18/2020  . Abdominal pain, left lower quadrant 06/16/2019  . Tick bite 02/07/2019  . Pain of left hip joint 09/07/2018  . Degeneration of thoracic intervertebral disc 06/17/2018  . Screening mammogram, encounter for 01/07/2018  . Dyspnea 10/25/2017  . Sinus tachycardia 10/25/2017  . Multiple lung nodules on CT 10/23/2017  . Spondylolisthesis, lumbar region 10/14/2017  . Pain in  thoracic spine 10/14/2017  . Backache 10/14/2017  . Low back pain 10/06/2017  . Cough 11/16/2016  . Routine general medical examination at a health care facility 01/06/2016  . IBS (irritable colon syndrome) 05/31/2015  . Colon polyps 05/31/2015  . Trochanteric bursitis of both hips 05/31/2015  . Estrogen deficiency 12/05/2014  . Encounter for Medicare annual wellness exam 09/24/2013  . Personal history of other malignant neoplasm of skin 09/21/2013  . Enlarged lymph node 03/07/2013  . Sarcoidosis 08/09/2012  . Mixed incontinence urge and stress 03/16/2012  . ALOPECIA 11/14/2010  . GERD 01/24/2009  . NEOPLASM, MALIGNANT, BREAST, HX OF 01/02/2009  . Hyperlipidemia 11/17/2007  . VAGINITIS, ATROPHIC, SYMPTOMATIC 11/17/2007  . Osteopenia 11/17/2007  . TINNITUS 09/07/2007  . ALLERGIC RHINITIS 09/07/2007  . DIVERTICULOSIS, COLON 09/07/2007  . Fatty liver 09/07/2007  . History of diverticulitis 08/12/2007   Past Medical History:  Diagnosis Date  . Allergic rhinitis   . Allergy   . Arthritis   . Atrophic vaginitis   . Breast cancer ([place])    Right mastectomy  . Diverticulosis   . GERD (gastroesophageal reflux disease)   . H/O: pneumonia    as child-viral pneumonia  . Headache   . History of shingles   . HLD (hyperlipidemia)    pt denies  . IBS (irritable bowel syndrome)   . OSA (obstructive sleep apnea) 06/20/2020  . Osteopenia   . Sarcoidosis   . Sliding hiatal hernia    Past Surgical History:  Procedure Laterality  Date  . APPENDECTOMY    . BASAL CELL CARCINOMA EXCISION     scalp  . BASAL CELL CARCINOMA EXCISION  08/2016   nose  . COLONOSCOPY  02/2010   divertics, rpt 5 years  . DEXA  2005   osteopenia, no change from 2003  . DILATION AND CURETTAGE OF UTERUS  10/1999  . LAPAROSCOPIC TOTAL HYSTERECTOMY  10/03   for fibroids/polyp  . MASTECTOMY Right 02/2009   right breast cancer  . POLYPECTOMY    . TOTAL ABDOMINAL HYSTERECTOMY  2003   for fibroids/polyps  .  VIDEO BRONCHOSCOPY WITH ENDOBRONCHIAL NAVIGATION N/A 02/01/2017   Procedure: VIDEO BRONCHOSCOPY WITH ENDOBRONCHIAL NAVIGATION;  Surgeon: Melrose Nakayama, MD;  Location: [facility];  Service: Thoracic;  Laterality: N/A;   Social History   Tobacco Use  . Smoking status: Former Smoker    Packs/day: 1.00    Years: 3.00    Pack years: 3.00    Types: Cigarettes    Start date: 40    Quit date: 08/31/1968    Years since quitting: 52.1  . Smokeless tobacco: Never Used  Vaping Use  . Vaping Use: Never used  Substance Use Topics  . Alcohol use: Not Currently    Alcohol/week: 0.0 standard drinks  . Drug use: No   Family History  Problem Relation Age of Onset  . Prostate cancer Father   . Prostate cancer Brother   . Lymphoma Brother   . Heart attack Paternal Grandmother   . Stomach cancer Maternal Grandfather   . Colon cancer Neg Hx   . Rectal cancer Neg Hx   . Esophageal cancer Neg Hx    Allergies  Allergen Reactions  . Niacin And Related Other (See Comments)    Caused black and blue marks all over body  . Meloxicam Rash   Current Outpatient Medications on File Prior to Visit  Medication Sig Dispense Refill  . albuterol (PROVENTIL HFA;VENTOLIN HFA) 108 (90 Base) MCG/ACT inhaler Inhale 2 puffs into the lungs every 4 (four) hours as needed for wheezing or shortness of breath. 1 Inhaler 2  . calcium-vitamin D (OSCAL WITH D) 500-200 MG-UNIT per tablet Take 1 tablet by mouth once a week.     . cyclobenzaprine (FLEXERIL) 10 MG tablet Take 10 mg by mouth 3 (three) times daily.    . diclofenac sodium (VOLTAREN) 1 % GEL Apply 2 g topically 3 (three) times daily as needed. 1 Tube 11  . erythromycin ophthalmic ointment SMARTSIG:In Eye(s)    . FIBER SELECT GUMMIES PO Take 2 tablets by mouth at bedtime.    . fish oil-omega-3 fatty acids 1000 MG capsule Take 1 g by mouth once a week.    . fluticasone (FLONASE) 50 MCG/ACT nasal spray Place 2 sprays into both nostrils daily as needed. 48 mL 2  .  folic acid (FOLVITE) 1 MG tablet Take 1 mg by mouth daily.    Marland Kitchen ibuprofen (ADVIL,MOTRIN) 200 MG tablet Take 400 mg by mouth every 8 (eight) hours as needed.     . methocarbamol (ROBAXIN) 500 MG tablet TAKE 1 TABLET BY MOUTH EVERY 8 HOURS AS NEEDED FOR MUSCLE SPASM 90 tablet 3  . methotrexate (RHEUMATREX) 2.5 MG tablet 8 tablets weekly    . Multiple Vitamin (MULTIVITAMIN WITH MINERALS) TABS tablet Take 1 tablet by mouth at bedtime.    Marland Kitchen neomycin-polymyxin-dexameth (MAXITROL) 0.1 % OINT Place 1 application into both eyes See admin instructions. PRN: TWICE DAILY X1 WEEK & THEN DECREASE TO  ONCE DAILY FOR X2 WEEKS    . PREDNISONE PO Take 15 mg by mouth daily with breakfast. Take 59m by mouth daily     No current facility-administered medications on file prior to visit.   Review of Systems  Constitutional: Positive for fever. Negative for chills and malaise/fatigue.  HENT: Positive for sore throat. Negative for congestion, ear pain and sinus pain.   Eyes: Negative for blurred vision, discharge and redness.  Respiratory: Positive for cough and sputum production. Negative for shortness of breath, wheezing and stridor.   Cardiovascular: Negative for chest pain, palpitations and leg swelling.  Gastrointestinal: Positive for diarrhea. Negative for abdominal pain, nausea and vomiting.  Musculoskeletal: Negative for myalgias.  Skin: Negative for rash.  Neurological: Positive for headaches. Negative for dizziness.    Observations/Objective: Patient appears well, in no distress Weight is baseline  No facial swelling or asymmetry Slightly hoarse voice No obvious tremor or mobility impairment Moving neck and UEs normally Able to hear the call well  No wheeze or shortness of breath during interview, cough sounds dry and hacking Talkative and mentally sharp with no cognitive changes No skin changes on face or neck , no rash or pallor Affect is normal    Assessment and Plan: Problem List Items  Addressed This Visit      Other   COVID-19    In pt with cardiac and pulmonary sarcoid on methotrexate and prednisone  Some hypoxia last night -used her 02 and better  Has albuterol if needed and taking tylenol Due to immunocomp status- a referral for ambulatory covid treatment was done and told pt she will get a call  inst for now to hold methotrexate until she d/w pulmonary (is weekly)  ER parameters discussed      Relevant Orders   Ambulatory referral for Covid Treatment       Follow Up Instructions: Continue fluids and rest  Monitor temperature, pulse ox and breathing If suddenly worse/short of breath go to the ER Continue your current medicines  Hold methotrexate and contact your pulmonologist  Tylenol is ok  Use your inhaler if you wheeze I placed a referral for covid treatment (like infusion) and you will get a call about that   Please keep [place]osted    I discussed the assessment and treatment plan with the patient. The patient was provided an opportunity to ask questions and all were answered. The patient agreed with the plan and demonstrated an understanding of the instructions.   The patient was advised to call back or seek an in-person evaluation if the symptoms worsen or if the condition fails to improve as anticipated.     MLoura Pardon MD

## 2020-10-18 NOTE — Assessment & Plan Note (Signed)
In pt with cardiac and pulmonary sarcoid on methotrexate and prednisone  Some hypoxia last night -used her 02 and better  Has albuterol if needed and taking tylenol Due to immunocomp status- a referral for ambulatory covid treatment was done and told pt she will get a call  inst for now to hold methotrexate until she d/w pulmonary (is weekly)  ER parameters discussed

## 2020-10-19 ENCOUNTER — Telehealth (HOSPITAL_COMMUNITY): Payer: Self-pay | Admitting: Pharmacist

## 2020-10-19 ENCOUNTER — Other Ambulatory Visit (HOSPITAL_COMMUNITY): Payer: Self-pay | Admitting: Family

## 2020-10-19 ENCOUNTER — Telehealth: Payer: Self-pay | Admitting: Family

## 2020-10-19 MED ORDER — NIRMATRELVIR/RITONAVIR (PAXLOVID)TABLET: ORAL_TABLET | ORAL | 0 refills | Status: DC

## 2020-10-19 MED FILL — PAXLOVID 20 X 150 MG & 10 X: 20 X 150 MG | 5 days supply | Qty: 20 | Fill #0

## 2020-10-19 NOTE — Telephone Encounter (Signed)
Called to discuss with patient about COVID-19 symptoms and the use of one of the available treatments for those with mild to moderate Covid symptoms and at a high risk of hospitalization.  Pt appears to qualify for outpatient treatment due to co-morbid conditions and/or a member of an at-risk group in accordance with the FDA Emergency Use Authorization.    Symptom onset: 10/16/20 Vaccinated: Yes Booster? Yes Immunocompromised? Yes Qualifiers: Chronic respiratory failure, sarcoidosis, history of breast cancer, hyperlipidemia  Continues to have cough, sore throat, and fatigue. Uses oxygen at night.  Has noted oxygen levels lower than normal and 88% today. We discussed the risks and benefits of treatment with Remdesivir and Paxlovid. She wishes to proceed with treatment with Paxlovid. See note below.  Outpatient Oral COVID Treatment Note  I connected with [place] on 10/19/2020/10:23 AM by telephone and verified that I am speaking with the correct person using two identifiers.  I discussed the limitations, risks, security, and privacy concerns of performing an evaluation and management service by telephone and the availability of in person appointments. I also discussed with the patient that there may be a patient responsible charge related to this service. The patient expressed understanding and agreed to proceed.  Patient location: Home Provider location: Clinic  Diagnosis: COVID-19 infection  Purpose of visit: Discussion of potential use of Molnupiravir or Paxlovid, a new treatment for mild to moderate COVID-19 viral infection in non-hospitalized patients.   Subjective: Patient is a 75 y.o. female who has been diagnosed with COVID 19 viral infection.  Their symptoms began on 2/16 with fever, fatigue, sore throat, and cough.    Past Medical History:  Diagnosis Date  . Allergic rhinitis   . Allergy   . Arthritis   . Atrophic vaginitis   . Breast cancer ([place])    Right mastectomy   . Diverticulosis   . GERD (gastroesophageal reflux disease)   . H/O: pneumonia    as child-viral pneumonia  . Headache   . History of shingles   . HLD (hyperlipidemia)    pt denies  . IBS (irritable bowel syndrome)   . OSA (obstructive sleep apnea) 06/20/2020  . Osteopenia   . Sarcoidosis   . Sliding hiatal hernia     Allergies  Allergen Reactions  . Niacin And Related Other (See Comments)    Caused black and blue marks all over body  . Meloxicam Rash     Current Outpatient Medications:  .  albuterol (PROVENTIL HFA;VENTOLIN HFA) 108 (90 Base) MCG/ACT inhaler, Inhale 2 puffs into the lungs every 4 (four) hours as needed for wheezing or shortness of breath., Disp: 1 Inhaler, Rfl: 2 .  calcium-vitamin D (OSCAL WITH D) 500-200 MG-UNIT per tablet, Take 1 tablet by mouth once a week. , Disp: , Rfl:  .  cyclobenzaprine (FLEXERIL) 10 MG tablet, Take 10 mg by mouth 3 (three) times daily., Disp: , Rfl:  .  diclofenac sodium (VOLTAREN) 1 % GEL, Apply 2 g topically 3 (three) times daily as needed., Disp: 1 Tube, Rfl: 11 .  erythromycin ophthalmic ointment, SMARTSIG:In Eye(s), Disp: , Rfl:  .  FIBER SELECT GUMMIES PO, Take 2 tablets by mouth at bedtime., Disp: , Rfl:  .  fish oil-omega-3 fatty acids 1000 MG capsule, Take 1 g by mouth once a week., Disp: , Rfl:  .  fluticasone (FLONASE) 50 MCG/ACT nasal spray, Place 2 sprays into both nostrils daily as needed., Disp: 48 mL, Rfl: 2 .  folic acid (FOLVITE) 1  MG tablet, Take 1 mg by mouth daily., Disp: , Rfl:  .  ibuprofen (ADVIL,MOTRIN) 200 MG tablet, Take 400 mg by mouth every 8 (eight) hours as needed. , Disp: , Rfl:  .  methocarbamol (ROBAXIN) 500 MG tablet, TAKE 1 TABLET BY MOUTH EVERY 8 HOURS AS NEEDED FOR MUSCLE SPASM, Disp: 90 tablet, Rfl: 3 .  methotrexate (RHEUMATREX) 2.5 MG tablet, 8 tablets weekly, Disp: , Rfl:  .  Multiple Vitamin (MULTIVITAMIN WITH MINERALS) TABS tablet, Take 1 tablet by mouth at bedtime., Disp: , Rfl:  .   neomycin-polymyxin-dexameth (MAXITROL) 0.1 % OINT, Place 1 application into both eyes See admin instructions. PRN: TWICE DAILY X1 WEEK & THEN DECREASE TO ONCE DAILY FOR X2 WEEKS, Disp: , Rfl:  .  PREDNISONE PO, Take 15 mg by mouth daily with breakfast. Take 3m by mouth daily, Disp: , Rfl:   Objective: Patient appears/sounds hoarse .  They are in no apparent distress.  Breathing is non labored.  Mood and behavior are normal.  Laboratory Data:  No results found for this or any previous visit (from the past 2160 hour(s)).   Assessment: 75y.o. female with mild/moderate COVID 19 viral infection diagnosed on 2/17 at high risk for progression to severe COVID 19.  Plan:  This patient is a 75y.o. female that meets the following criteria for Emergency Use Authorization of: Paxlovid 1. Age >12 yr AND > 40 kg 2. SARS-COV-2 positive test 3. Symptom onset < 5 days 4. Mild-to-moderate COVID disease with high risk for severe progression to hospitalization or death  I have spoken and communicated the following to the patient or parent/caregiver regarding: 1. Paxlovid is an unapproved drug that is authorized for use under an Emergency Use Authorization.  2. There are no adequate, approved, available products for the treatment of COVID-19 in adults who have mild-to-moderate COVID-19 and are at high risk for progressing to severe COVID-19, including hospitalization or death. 3. Other therapeutics are currently authorized. For additional information on all products authorized for treatment or prevention of COVID-19, please see [facility].pl  4. There are benefits and risks of taking this treatment as outlined in the "Fact Sheet for Patients and Caregivers."  5. "Fact Sheet for Patients and Caregivers" was reviewed with patient. A hard copy will be provided to patient from pharmacy prior to the patient  receiving treatment. 6. Patients should continue to self-isolate and use infection control measures (e.g., wear mask, isolate, social distance, avoid sharing personal items, clean and disinfect "high touch" surfaces, and frequent handwashing) according to CDC guidelines.  7. The patient or parent/caregiver has the option to accept or refuse treatment. 8. Patient medication history was reviewed for potential drug interactions:Interaction with home meds: Prednisone and flonase (not currently taking) discussed reducing dose to 10 mg.  9. Patient's GFR was calculated to be 54, and they were therefore prescribed Reduced dose (GFR 30-60) - nirmatrelvir 1534mtab (1 tablet) by mouth twice daily AND ritonavir 10078mab (1 tablet) by mouth twice daily   After reviewing above information with the patient, the patient agrees to receive Paxlovid.  Follow up instructions:    . Take prescription BID x 5 days as directed . Reach out to pharmacist for counseling on medication if desired . For concerns regarding further COVID symptoms please follow up with your PCP or urgent care . For urgent or life-threatening issues, seek care at your local emergency department  The patient was provided an opportunity to ask questions, and all were  answered. The patient agreed with the plan and demonstrated an understanding of the instructions.   Script sent to [facility] and opted to pick up RX.  The patient was advised to call their PCP or seek an in-person evaluation if the symptoms worsen or if the condition fails to improve as anticipated.   I provided 20 minutes of non face-to-face telephone visit time during this encounter, and > 50% was spent counseling as documented under my assessment & plan.  Mauricio Po, FNP 10/19/2020 /10:23 AM

## 2020-10-19 NOTE — Telephone Encounter (Signed)
Patient was prescribed oral covid treatment Paxlovid and treatment note was reviewed. Medication has been received by [place] and reviewed for appropriateness.  Drug Interactions or Dosage Adjustments Noted:  Crcl: 43 mL/min - renally adjusted dosing required  DDI - recommendations:  Flonase: hold   Maxitrol eye drops: hold   Prednisone: discussed this with prescribing provider; decided to reduce dose of prednisone during paxlovid therapy as recommended by UpToDate  Delivery Method: Pick-up  Patient contacted for counseling on 10/19/20 and verbalized understanding.   Delivery or Pick-Up Date: 10/19/20   Lowella Bandy 10/19/2020, 11:46 AM [facility] Outpatient Pharmacist Phone# [phone]

## 2020-10-21 ENCOUNTER — Encounter: Payer: Self-pay | Admitting: Family Medicine

## 2020-10-30 ENCOUNTER — Other Ambulatory Visit: Payer: Self-pay

## 2020-10-30 ENCOUNTER — Ambulatory Visit (INDEPENDENT_AMBULATORY_CARE_PROVIDER_SITE_OTHER): Payer: Medicare HMO | Admitting: Podiatry

## 2020-10-30 ENCOUNTER — Encounter: Payer: Self-pay | Admitting: Podiatry

## 2020-10-30 DIAGNOSIS — S86012A Strain of left Achilles tendon, initial encounter: Secondary | ICD-10-CM | POA: Diagnosis not present

## 2020-10-30 NOTE — Progress Notes (Signed)
She presents today for follow-up of her Achilles tendinitis of her left heel.  States that is still hurting he really has not gotten any better since October.  She states that wearing the boot has caused her other hip to hurt and is also causing the posterior aspect of her heel to hurt as well.  Objective: Vital signs are stable she is alert and oriented x3.  Pulses are palpable.  Still has severe pain on palpation of the distal medial aspect of the Achilles with some mild edema.  Mild erythema mild edema mild fluctuance.  Assessment: Insertional Achilles tendinitis.  Possible tear of the Achilles distal medial.  Plan: At this point requesting MRI for differential diagnosis surgical planning.  We will send to physical therapy if negative.

## 2020-11-01 DIAGNOSIS — D869 Sarcoidosis, unspecified: Secondary | ICD-10-CM | POA: Diagnosis not present

## 2020-11-05 ENCOUNTER — Ambulatory Visit: Payer: Medicare HMO | Admitting: Podiatry

## 2020-11-05 ENCOUNTER — Encounter: Payer: Self-pay | Admitting: Podiatry

## 2020-11-11 ENCOUNTER — Ambulatory Visit: Payer: Medicare HMO | Admitting: Podiatry

## 2020-11-11 ENCOUNTER — Encounter: Payer: Self-pay | Admitting: Podiatry

## 2020-11-11 ENCOUNTER — Other Ambulatory Visit: Payer: Self-pay

## 2020-11-11 DIAGNOSIS — M7662 Achilles tendinitis, left leg: Secondary | ICD-10-CM | POA: Diagnosis not present

## 2020-11-11 MED ORDER — DEXAMETHASONE SODIUM PHOSPHATE 120 MG/30ML IJ SOLN
2.0000 mg | Freq: Once | INTRAMUSCULAR | Status: AC
  Administered 2020-11-11: 2 mg via INTRA_ARTICULAR

## 2020-11-11 NOTE — Progress Notes (Signed)
She presents today complaining of pain to the posterior medial aspect of her left heel.  She states that she is getting ready to go on vacation would like to have a injection if possible she is going to have an MRI at the end of the month.  Objective: Vital signs are stable alert oriented x3 she has pain on palpation of the posterior distal medial aspect of the Achilles insertion fibers left foot.  Assessment: Achilles tendinitis.  Plan: I injected the area today with dexamethasone local anesthetic 2 mg was injected making sure not to inject into the tendon I injected medial to the tendon and superficial subcutaneous tissue.  Follow-up with me after the MRI.

## 2020-11-25 DIAGNOSIS — D8689 Sarcoidosis of other sites: Secondary | ICD-10-CM | POA: Diagnosis not present

## 2020-11-25 DIAGNOSIS — D8685 Sarcoid myocarditis: Secondary | ICD-10-CM | POA: Diagnosis not present

## 2020-11-25 DIAGNOSIS — E669 Obesity, unspecified: Secondary | ICD-10-CM | POA: Diagnosis not present

## 2020-11-27 ENCOUNTER — Ambulatory Visit
Admission: RE | Admit: 2020-11-27 | Discharge: 2020-11-27 | Disposition: A | Discharge status: Home / Self Care | Payer: Medicare HMO | Source: Ambulatory Visit | Attending: Podiatry | Admitting: Podiatry

## 2020-11-27 ENCOUNTER — Other Ambulatory Visit: Payer: Self-pay

## 2020-11-27 DIAGNOSIS — M65872 Other synovitis and tenosynovitis, left ankle and foot: Secondary | ICD-10-CM | POA: Diagnosis not present

## 2020-11-27 DIAGNOSIS — M19072 Primary osteoarthritis, left ankle and foot: Secondary | ICD-10-CM | POA: Diagnosis not present

## 2020-11-27 DIAGNOSIS — M722 Plantar fascial fibromatosis: Secondary | ICD-10-CM | POA: Diagnosis not present

## 2020-11-27 DIAGNOSIS — M7732 Calcaneal spur, left foot: Secondary | ICD-10-CM | POA: Diagnosis not present

## 2020-11-27 DIAGNOSIS — S86012A Strain of left Achilles tendon, initial encounter: Secondary | ICD-10-CM

## 2020-12-02 ENCOUNTER — Telehealth: Payer: Self-pay

## 2020-12-02 DIAGNOSIS — H2513 Age-related nuclear cataract, bilateral: Secondary | ICD-10-CM | POA: Diagnosis not present

## 2020-12-02 DIAGNOSIS — D869 Sarcoidosis, unspecified: Secondary | ICD-10-CM | POA: Diagnosis not present

## 2020-12-02 NOTE — Telephone Encounter (Signed)
-----   Message from Garrel Ridgel, [place] sent at 11/28/2020  5:26 PM EDT ----- Achilles looks good.  Send to PT.  That should help.  They may need a copy of the MRI.

## 2020-12-02 NOTE — Telephone Encounter (Signed)
Patient has been notified of results and informed of recommendation to therapy per Dr. Milinda Pointer.  She stated that she will go to physical therapy to see if it will help.  She continues to have pain when walking.  She asked if a cam boot would help for a little while.  I instructed her to pick up boot at office and schedule physical therapy for treatment.   Script to [place] has been faxed along with MRI report

## 2020-12-20 DIAGNOSIS — D860 Sarcoidosis of lung: Secondary | ICD-10-CM | POA: Diagnosis not present

## 2020-12-20 DIAGNOSIS — J849 Interstitial pulmonary disease, unspecified: Secondary | ICD-10-CM | POA: Diagnosis not present

## 2020-12-20 DIAGNOSIS — R918 Other nonspecific abnormal finding of lung field: Secondary | ICD-10-CM | POA: Diagnosis not present

## 2020-12-20 DIAGNOSIS — D8685 Sarcoid myocarditis: Secondary | ICD-10-CM | POA: Diagnosis not present

## 2020-12-23 DIAGNOSIS — D8685 Sarcoid myocarditis: Secondary | ICD-10-CM | POA: Diagnosis not present

## 2020-12-23 DIAGNOSIS — D860 Sarcoidosis of lung: Secondary | ICD-10-CM | POA: Diagnosis not present

## 2020-12-25 DIAGNOSIS — M25572 Pain in left ankle and joints of left foot: Secondary | ICD-10-CM | POA: Diagnosis not present

## 2020-12-31 DIAGNOSIS — Z95818 Presence of other cardiac implants and grafts: Secondary | ICD-10-CM | POA: Diagnosis not present

## 2021-01-01 DIAGNOSIS — D869 Sarcoidosis, unspecified: Secondary | ICD-10-CM | POA: Diagnosis not present

## 2021-01-01 DIAGNOSIS — M25572 Pain in left ankle and joints of left foot: Secondary | ICD-10-CM | POA: Diagnosis not present

## 2021-01-02 DIAGNOSIS — G4733 Obstructive sleep apnea (adult) (pediatric): Secondary | ICD-10-CM | POA: Diagnosis not present

## 2021-01-03 DIAGNOSIS — M25572 Pain in left ankle and joints of left foot: Secondary | ICD-10-CM | POA: Diagnosis not present

## 2021-01-06 DIAGNOSIS — M25572 Pain in left ankle and joints of left foot: Secondary | ICD-10-CM | POA: Diagnosis not present

## 2021-01-09 DIAGNOSIS — M25572 Pain in left ankle and joints of left foot: Secondary | ICD-10-CM | POA: Diagnosis not present

## 2021-01-15 DIAGNOSIS — Z7952 Long term (current) use of systemic steroids: Secondary | ICD-10-CM | POA: Diagnosis not present

## 2021-01-15 DIAGNOSIS — D8685 Sarcoid myocarditis: Secondary | ICD-10-CM | POA: Diagnosis not present

## 2021-01-15 DIAGNOSIS — Z79899 Other long term (current) drug therapy: Secondary | ICD-10-CM | POA: Diagnosis not present

## 2021-01-16 ENCOUNTER — Other Ambulatory Visit: Payer: Self-pay | Admitting: Family Medicine

## 2021-01-17 DIAGNOSIS — M25572 Pain in left ankle and joints of left foot: Secondary | ICD-10-CM | POA: Diagnosis not present

## 2021-01-17 NOTE — Telephone Encounter (Signed)
CPE scheduled for 02/19/21, last filled on 02/14/20 #90 tabs with 3 refills

## 2021-01-20 DIAGNOSIS — M25572 Pain in left ankle and joints of left foot: Secondary | ICD-10-CM | POA: Diagnosis not present

## 2021-01-22 DIAGNOSIS — M25572 Pain in left ankle and joints of left foot: Secondary | ICD-10-CM | POA: Diagnosis not present

## 2021-01-27 DIAGNOSIS — Z95818 Presence of other cardiac implants and grafts: Secondary | ICD-10-CM | POA: Diagnosis not present

## 2021-01-30 DIAGNOSIS — M25572 Pain in left ankle and joints of left foot: Secondary | ICD-10-CM | POA: Diagnosis not present

## 2021-02-01 DIAGNOSIS — D869 Sarcoidosis, unspecified: Secondary | ICD-10-CM | POA: Diagnosis not present

## 2021-02-06 DIAGNOSIS — M25572 Pain in left ankle and joints of left foot: Secondary | ICD-10-CM | POA: Diagnosis not present

## 2021-02-11 ENCOUNTER — Telehealth: Payer: Self-pay | Admitting: Family Medicine

## 2021-02-11 DIAGNOSIS — Z0000 Encounter for general adult medical examination without abnormal findings: Secondary | ICD-10-CM

## 2021-02-11 DIAGNOSIS — K760 Fatty (change of) liver, not elsewhere classified: Secondary | ICD-10-CM

## 2021-02-11 DIAGNOSIS — E7800 Pure hypercholesterolemia, unspecified: Secondary | ICD-10-CM

## 2021-02-11 NOTE — Telephone Encounter (Signed)
-----   Message from Ellamae Sia sent at 01/28/2021  1:53 PM EDT ----- Regarding: lab orders for Wednesday, 6.15.22 Patient is scheduled for CPX labs, please order future labs, Thanks , Karna Christmas

## 2021-02-12 ENCOUNTER — Other Ambulatory Visit (INDEPENDENT_AMBULATORY_CARE_PROVIDER_SITE_OTHER): Payer: Medicare HMO

## 2021-02-12 ENCOUNTER — Other Ambulatory Visit: Payer: Self-pay

## 2021-02-12 DIAGNOSIS — E7800 Pure hypercholesterolemia, unspecified: Secondary | ICD-10-CM | POA: Diagnosis not present

## 2021-02-12 DIAGNOSIS — Z0000 Encounter for general adult medical examination without abnormal findings: Secondary | ICD-10-CM

## 2021-02-12 LAB — CBC WITH DIFFERENTIAL/PLATELET
Basophils Absolute: 0 10*3/uL (ref 0.0–0.1)
Basophils Relative: 0.5 % (ref 0.0–3.0)
Eosinophils Absolute: 0.1 10*3/uL (ref 0.0–0.7)
Eosinophils Relative: 1 % (ref 0.0–5.0)
HCT: 45 % (ref 36.0–46.0)
Hemoglobin: 14.8 g/dL (ref 12.0–15.0)
Lymphocytes Relative: 41.6 % (ref 12.0–46.0)
Lymphs Abs: 3.4 10*3/uL (ref 0.7–4.0)
MCHC: 32.8 g/dL (ref 30.0–36.0)
MCV: 99.1 fl (ref 78.0–100.0)
Monocytes Absolute: 0.5 10*3/uL (ref 0.1–1.0)
Monocytes Relative: 5.9 % (ref 3.0–12.0)
Neutro Abs: 4.2 10*3/uL (ref 1.4–7.7)
Neutrophils Relative %: 51 % (ref 43.0–77.0)
Platelets: 289 10*3/uL (ref 150.0–400.0)
RBC: 4.54 Mil/uL (ref 3.87–5.11)
RDW: 14.8 % (ref 11.5–15.5)
WBC: 8.2 10*3/uL (ref 4.0–10.5)

## 2021-02-12 LAB — COMPREHENSIVE METABOLIC PANEL
ALT: 24 U/L (ref 0–35)
AST: 13 U/L (ref 0–37)
Albumin: 4.3 g/dL (ref 3.5–5.2)
Alkaline Phosphatase: 42 U/L (ref 39–117)
BUN: 25 mg/dL — ABNORMAL HIGH (ref 6–23)
CO2: 27 mEq/L (ref 19–32)
Calcium: 9.6 mg/dL (ref 8.4–10.5)
Chloride: 106 mEq/L (ref 96–112)
Creatinine, Ser: 0.93 mg/dL (ref 0.40–1.20)
GFR: 59.93 mL/min — ABNORMAL LOW (ref >60.00)
Glucose, Bld: 76 mg/dL (ref 70–99)
Potassium: 4.4 mEq/L (ref 3.5–5.1)
Sodium: 143 mEq/L (ref 135–145)
Total Bilirubin: 0.6 mg/dL (ref 0.2–1.2)
Total Protein: 6.3 g/dL (ref 6.0–8.3)

## 2021-02-12 LAB — LIPID PANEL
Cholesterol: 204 mg/dL — ABNORMAL HIGH (ref 0–200)
HDL: 62.9 mg/dL (ref >39.00)
LDL Cholesterol: 110 mg/dL — ABNORMAL HIGH (ref 0–99)
NonHDL: 141.49
Total CHOL/HDL Ratio: 3
Triglycerides: 156 mg/dL — ABNORMAL HIGH (ref 0.0–149.0)
VLDL: 31.2 mg/dL (ref 0.0–40.0)

## 2021-02-12 LAB — TSH: TSH: 2.82 u[IU]/mL (ref 0.35–4.50)

## 2021-02-13 DIAGNOSIS — M25572 Pain in left ankle and joints of left foot: Secondary | ICD-10-CM | POA: Diagnosis not present

## 2021-02-19 ENCOUNTER — Ambulatory Visit (INDEPENDENT_AMBULATORY_CARE_PROVIDER_SITE_OTHER): Payer: Medicare HMO | Admitting: Family Medicine

## 2021-02-19 ENCOUNTER — Other Ambulatory Visit: Payer: Self-pay

## 2021-02-19 ENCOUNTER — Encounter: Payer: Self-pay | Admitting: Internal Medicine

## 2021-02-19 ENCOUNTER — Ambulatory Visit: Payer: Medicare HMO | Admitting: Internal Medicine

## 2021-02-19 ENCOUNTER — Encounter: Payer: Self-pay | Admitting: Family Medicine

## 2021-02-19 VITALS — BP 102/64 | HR 92 | Temp 97.6°F | Ht 61.0 in | Wt 159.0 lb

## 2021-02-19 VITALS — BP 100/64 | HR 91 | Temp 98.2°F | Ht 61.0 in | Wt 159.2 lb

## 2021-02-19 DIAGNOSIS — Z0000 Encounter for general adult medical examination without abnormal findings: Secondary | ICD-10-CM | POA: Diagnosis not present

## 2021-02-19 DIAGNOSIS — D225 Melanocytic nevi of trunk: Secondary | ICD-10-CM | POA: Diagnosis not present

## 2021-02-19 DIAGNOSIS — D869 Sarcoidosis, unspecified: Secondary | ICD-10-CM | POA: Diagnosis not present

## 2021-02-19 DIAGNOSIS — E7800 Pure hypercholesterolemia, unspecified: Secondary | ICD-10-CM | POA: Diagnosis not present

## 2021-02-19 DIAGNOSIS — D2272 Melanocytic nevi of left lower limb, including hip: Secondary | ICD-10-CM | POA: Diagnosis not present

## 2021-02-19 DIAGNOSIS — L820 Inflamed seborrheic keratosis: Secondary | ICD-10-CM | POA: Diagnosis not present

## 2021-02-19 DIAGNOSIS — Z85820 Personal history of malignant melanoma of skin: Secondary | ICD-10-CM | POA: Diagnosis not present

## 2021-02-19 DIAGNOSIS — L570 Actinic keratosis: Secondary | ICD-10-CM | POA: Diagnosis not present

## 2021-02-19 DIAGNOSIS — R208 Other disturbances of skin sensation: Secondary | ICD-10-CM | POA: Diagnosis not present

## 2021-02-19 DIAGNOSIS — G4733 Obstructive sleep apnea (adult) (pediatric): Secondary | ICD-10-CM | POA: Diagnosis not present

## 2021-02-19 DIAGNOSIS — D2261 Melanocytic nevi of right upper limb, including shoulder: Secondary | ICD-10-CM | POA: Diagnosis not present

## 2021-02-19 DIAGNOSIS — E2839 Other primary ovarian failure: Secondary | ICD-10-CM

## 2021-02-19 DIAGNOSIS — Z1231 Encounter for screening mammogram for malignant neoplasm of breast: Secondary | ICD-10-CM

## 2021-02-19 DIAGNOSIS — M8589 Other specified disorders of bone density and structure, multiple sites: Secondary | ICD-10-CM | POA: Diagnosis not present

## 2021-02-19 DIAGNOSIS — D040 Carcinoma in situ of skin of lip: Secondary | ICD-10-CM | POA: Diagnosis not present

## 2021-02-19 DIAGNOSIS — D3701 Neoplasm of uncertain behavior of lip: Secondary | ICD-10-CM | POA: Diagnosis not present

## 2021-02-19 DIAGNOSIS — Z85828 Personal history of other malignant neoplasm of skin: Secondary | ICD-10-CM | POA: Diagnosis not present

## 2021-02-19 DIAGNOSIS — L538 Other specified erythematous conditions: Secondary | ICD-10-CM | POA: Diagnosis not present

## 2021-02-19 MED ORDER — METHOCARBAMOL 500 MG PO TABS: ORAL_TABLET | ORAL | 5 refills | Status: DC

## 2021-02-19 NOTE — Assessment & Plan Note (Signed)
Struggling with pulm and cardiol effects  Seeing rheumatology and considering change to humira if she can get financial asst On mtx now with prednisone

## 2021-02-19 NOTE — Progress Notes (Signed)
Subjective:    Patient ID: Judith [name], female    DOB: [birth year], 76 y.o.   MRN: [id number]  This visit occurred during the SARS-CoV-2 public health emergency.  Safety protocols were in place, including screening questions prior to the visit, additional usage of staff PPE, and extensive cleaning of exam room while observing appropriate contact time as indicated for disinfecting solutions.   HPI Pt presents for amw and health mt exam /rev of chronic problems   I have personally reviewed the Medicare Annual Wellness questionnaire and have noted 1. The patient's medical and social history 2. Their use of alcohol, tobacco or illicit drugs 3. Their current medications and supplements 4. The patient's functional ability including ADL's, fall risks, home safety risks and hearing or visual             impairment. 5. Diet and physical activities 6. Evidence for depression or mood disorders  The patients weight, height, BMI have been recorded in the chart and visual acuity is per eye clinic.  I have made referrals, counseling and provided education to the patient based review of the above and I have provided the pt with a written personalized care plan for preventive services. Reviewed and updated provider list, see scanned forms.  See scanned forms.  Routine anticipatory guidance given to patient.  See health maintenance.  Colon cancer screening  colonoscopy 11/16 Breast cancer screening  mammogram 6/19 H/o R mastectomy  Self breast exam-no lumps  Flu vaccine 9/21 Tetanus vaccine 7/12 Tdap Pneumovax completed Covid imm with booster Zoster vaccine -interested in shingrix if covered Dexa 5/19 osteopenia  Falls - none Fractures-none  Supplements ca and D  Exercise -walking   Advance directive- utd  Cognitive function addressed- see scanned forms- and if abnormal then additional documentation follows.   Some days with low 02 a little harder to concentrate Pretty sharp   PMH  and SH reviewed  Meds, vitals, and allergies reviewed.   ROS: See HPI.  Otherwise negative.    Weight : Wt Readings from Last 3 Encounters:  02/19/21 159 lb (72.1 kg)  07/22/20 159 lb 9.6 oz (72.4 kg)  06/20/20 157 lb 9.6 oz (71.5 kg)   30.04 kg/m  Walks her dog daily   Not doing much  Trying to take care of herself    Hearing/vision: Has cataracts= eye exam is up to date  Hearing is good  Hearing Screening   500Hz  1000Hz  2000Hz  4000Hz   Right ear 20 20 20 20   Left ear 20 20 20 20       Care team Kiyaan Haq-pcp Isenstein-derm- appt today  [facility]-podiatry  (plantar fasciitis) , and some calf muscle tightness -doing PT now  [facility]- rheum Harrison- card    BP Readings from Last 3 Encounters:  02/19/21 102/64  10/18/20 (!) 132/107  07/22/20 128/78   Pulse Readings from Last 3 Encounters:  02/19/21 92  10/18/20 (!) 106  07/22/20 91     Sarcoid/respiratory issues -struggles   Hyperlipidemia Lab Results  Component Value Date   CHOL 204 (H) 02/12/2021   CHOL 209 (H) 01/19/2020   CHOL 195 01/18/2019   Lab Results  Component Value Date   HDL 62.90 02/12/2021   HDL 65.90 01/19/2020   HDL 46.20 01/18/2019   Lab Results  Component Value Date   LDLCALC 110 (H) 02/12/2021   LDLCALC 117 (H) 01/19/2020   LDLCALC 128 (H) 01/18/2019   Lab Results  Component Value Date   TRIG 156.0 (H) 02/12/2021  TRIG 134.0 01/19/2020   TRIG 100.0 01/18/2019   Lab Results  Component Value Date   CHOLHDL 3 02/12/2021   CHOLHDL 3 01/19/2020   CHOLHDL 4 01/18/2019   Lab Results  Component Value Date   LDLDIRECT 154.0 03/13/2011   LDLDIRECT 134.4 12/18/2008   LDLDIRECT 148.2 11/17/2007   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 Want to put her on Humira  2000 $ per month Applied for assist  Frustrating   Pred and mtx are not working as well as she wants   Not a lot of stress due to that  Some of her social activities are stressful so she cut back  Low  energy level   Other labs Results for orders placed or performed in visit on 02/12/21  TSH  Result Value Ref Range   TSH 2.82 0.35 - 4.50 uIU/mL  Lipid panel  Result Value Ref Range   Cholesterol 204 (H) 0 - 200 mg/dL   Triglycerides 156.0 (H) 0.0 - 149.0 mg/dL   HDL 62.90 >39.00 mg/dL   VLDL 31.2 0.0 - 40.0 mg/dL   LDL Cholesterol 110 (H) 0 - 99 mg/dL   Total CHOL/HDL Ratio 3    NonHDL 141.49   Comprehensive metabolic panel  Result Value Ref Range   Sodium 143 135 - 145 mEq/L   Potassium 4.4 3.5 - 5.1 mEq/L   Chloride 106 96 - 112 mEq/L   CO2 27 19 - 32 mEq/L   Glucose, Bld 76 70 - 99 mg/dL   BUN 25 (H) 6 - 23 mg/dL   Creatinine, Ser 0.93 0.40 - 1.20 mg/dL   Total Bilirubin 0.6 0.2 - 1.2 mg/dL   Alkaline Phosphatase 42 39 - 117 U/L   AST 13 0 - 37 U/L   ALT 24 0 - 35 U/L   Total Protein 6.3 6.0 - 8.3 g/dL   Albumin 4.3 3.5 - 5.2 g/dL   GFR 59.93 (L) >60.00 mL/min   Calcium 9.6 8.4 - 10.5 mg/dL  CBC with Differential/Platelet  Result Value Ref Range   WBC 8.2 4.0 - 10.5 K/uL   RBC 4.54 3.87 - 5.11 Mil/uL   Hemoglobin 14.8 12.0 - 15.0 g/dL   HCT 45.0 36.0 - 46.0 %   MCV 99.1 78.0 - 100.0 fl   MCHC 32.8 30.0 - 36.0 g/dL   RDW 14.8 11.5 - 15.5 %   Platelets 289.0 150.0 - 400.0 K/uL   Neutrophils Relative % 51.0 43.0 - 77.0 %   Lymphocytes Relative 41.6 12.0 - 46.0 %   Monocytes Relative 5.9 3.0 - 12.0 %   Eosinophils Relative 1.0 0.0 - 5.0 %   Basophils Relative 0.5 0.0 - 3.0 %   Neutro Abs 4.2 1.4 - 7.7 K/uL   Lymphs Abs 3.4 0.7 - 4.0 K/uL   Monocytes Absolute 0.5 0.1 - 1.0 K/uL   Eosinophils Absolute 0.1 0.0 - 0.7 K/uL   Basophils Absolute 0.0 0.0 - 0.1 K/uL    Patient Active Problem List   Diagnosis Date Noted   Chronic respiratory failure with hypoxia (HCC) 06/20/2020   OSA (obstructive sleep apnea) 06/20/2020   Unknown status of immunity to COVID-19 virus 03/18/2020   Degeneration of thoracic intervertebral disc 06/17/2018   Encounter for screening  mammogram for breast cancer 01/07/2018   Dyspnea 10/25/2017   Sinus tachycardia 10/25/2017   Multiple lung nodules on CT 10/23/2017   Spondylolisthesis, lumbar region 10/14/2017   Cough 11/16/2016   Routine general medical examination  at a health care facility 01/06/2016   IBS (irritable colon syndrome) 05/31/2015   Colon polyps 05/31/2015   Trochanteric bursitis of both hips 05/31/2015   Estrogen deficiency 12/05/2014   Encounter for Medicare annual wellness exam 09/24/2013   Personal history of other malignant neoplasm of skin 09/21/2013   Enlarged lymph node 03/07/2013   Sarcoidosis 08/09/2012   Mixed incontinence urge and stress 03/16/2012   ALOPECIA 11/14/2010   GERD 01/24/2009   NEOPLASM, MALIGNANT, BREAST, HX OF 01/02/2009   Hyperlipidemia 11/17/2007   Osteopenia 11/17/2007   TINNITUS 09/07/2007   ALLERGIC RHINITIS 09/07/2007   DIVERTICULOSIS, COLON 09/07/2007   Fatty liver 09/07/2007   History of diverticulitis 08/12/2007   Past Medical History:  Diagnosis Date   Allergic rhinitis    Allergy    Arthritis    Atrophic vaginitis    Breast cancer ([place])    Right mastectomy   Diverticulosis    GERD (gastroesophageal reflux disease)    H/O: pneumonia    as child-viral pneumonia   Headache    History of shingles    HLD (hyperlipidemia)    pt denies   IBS (irritable bowel syndrome)    OSA (obstructive sleep apnea) 06/20/2020   Osteopenia    Sarcoidosis    Sliding hiatal hernia    Past Surgical History:  Procedure Laterality Date   APPENDECTOMY     BASAL CELL CARCINOMA EXCISION     scalp   BASAL CELL CARCINOMA EXCISION  08/2016   nose   COLONOSCOPY  02/2010   divertics, rpt 5 years   DEXA  2005   osteopenia, no change from 2003   [place]  10/1999   LAPAROSCOPIC TOTAL HYSTERECTOMY  10/03   for fibroids/polyp   MASTECTOMY Right 02/2009   right breast cancer   POLYPECTOMY     TOTAL ABDOMINAL HYSTERECTOMY  2003   for fibroids/polyps    VIDEO BRONCHOSCOPY WITH ENDOBRONCHIAL NAVIGATION N/A 02/01/2017   Procedure: VIDEO BRONCHOSCOPY WITH ENDOBRONCHIAL NAVIGATION;  Surgeon: Melrose Nakayama, MD;  Location: [facility] OR;  Service: Thoracic;  Laterality: N/A;   Social History   Tobacco Use   Smoking status: Former    Packs/day: 1.00    Years: 3.00    Pack years: 3.00    Types: Cigarettes    Start date: 43    Quit date: 08/31/1968    Years since quitting: 52.5   Smokeless tobacco: Never  Vaping Use   Vaping Use: Never used  Substance Use Topics   Alcohol use: Not Currently    Alcohol/week: 0.0 standard drinks   Drug use: No   Family History  Problem Relation Age of Onset   Prostate cancer Father    Prostate cancer Brother    Lymphoma Brother    Heart attack Paternal Grandmother    Stomach cancer Maternal Grandfather    Colon cancer Neg Hx    Rectal cancer Neg Hx    Esophageal cancer Neg Hx    Allergies  Allergen Reactions   Niacin And Related Other (See Comments)    Caused black and blue marks all over body   Meloxicam Rash   Current Outpatient Medications on File Prior to Visit  Medication Sig Dispense Refill   albuterol (PROVENTIL HFA;VENTOLIN HFA) 108 (90 Base) MCG/ACT inhaler Inhale 2 puffs into the lungs every 4 (four) hours as needed for wheezing or shortness of breath. 1 Inhaler 2   calcium-vitamin D (OSCAL WITH D) 500-200 MG-UNIT per tablet Take  1 tablet by mouth once a week.      cyclobenzaprine (FLEXERIL) 10 MG tablet Take 10 mg by mouth 3 (three) times daily.     diclofenac sodium (VOLTAREN) 1 % GEL Apply 2 g topically 3 (three) times daily as needed. 1 Tube 11   erythromycin ophthalmic ointment SMARTSIG:In Eye(s)     FIBER SELECT GUMMIES PO Take 2 tablets by mouth at bedtime.     fish oil-omega-3 fatty acids 1000 MG capsule Take 1 g by mouth once a week.     fluticasone (FLONASE) 50 MCG/ACT nasal spray Place 2 sprays into both nostrils daily as needed. 48 mL 2   folic acid (FOLVITE) 1 MG tablet  Take 1 mg by mouth daily.     ibuprofen (ADVIL,MOTRIN) 200 MG tablet Take 400 mg by mouth every 8 (eight) hours as needed.      methotrexate (RHEUMATREX) 2.5 MG tablet 8 tablets weekly     Multiple Vitamin (MULTIVITAMIN WITH MINERALS) TABS tablet Take 1 tablet by mouth at bedtime.     neomycin-polymyxin-dexameth (MAXITROL) 0.1 % OINT Place 1 application into both eyes See admin instructions. PRN: TWICE DAILY X1 WEEK & THEN DECREASE TO ONCE DAILY FOR X2 WEEKS     PREDNISONE PO Take 15 mg by mouth daily with breakfast. Take 46m by mouth daily     No current facility-administered medications on file prior to visit.    Review of Systems  Constitutional:  Positive for fatigue. Negative for activity change, appetite change, fever and unexpected weight change.  HENT:  Negative for congestion, ear pain, rhinorrhea, sinus pressure and sore throat.   Eyes:  Negative for pain, redness and visual disturbance.  Respiratory:  Positive for cough and shortness of breath. Negative for wheezing.   Cardiovascular:  Negative for chest pain, palpitations and leg swelling.  Gastrointestinal:  Negative for abdominal pain, blood in stool, constipation and diarrhea.  Endocrine: Negative for polydipsia and polyuria.  Genitourinary:  Negative for dysuria, frequency and urgency.  Musculoskeletal:  Negative for arthralgias, back pain and myalgias.  Skin:  Negative for pallor and rash.  Allergic/Immunologic: Negative for environmental allergies.  Neurological:  Negative for dizziness, syncope and headaches.  Hematological:  Negative for adenopathy. Does not bruise/bleed easily.  Psychiatric/Behavioral:  Negative for decreased concentration and dysphoric mood. The patient is not nervous/anxious.       Objective:   Physical Exam Constitutional:      General: She is not in acute distress.    Appearance: Normal appearance. She is well-developed. She is obese. She is not ill-appearing or diaphoretic.  HENT:     Head:  Normocephalic and atraumatic.     Right Ear: Tympanic membrane, ear canal and external ear normal.     Left Ear: Tympanic membrane, ear canal and external ear normal.     Nose: Nose normal. No congestion.     Mouth/Throat:     Mouth: Mucous membranes are moist.     Pharynx: Oropharynx is clear. No posterior oropharyngeal erythema.  Eyes:     General: No scleral icterus.    Extraocular Movements: Extraocular movements intact.     Conjunctiva/sclera: Conjunctivae normal.     Pupils: Pupils are equal, round, and reactive to light.  Neck:     Thyroid: No thyromegaly.     Vascular: No carotid bruit or JVD.  Cardiovascular:     Rate and Rhythm: Normal rate and regular rhythm.     Pulses: Normal pulses.  Heart sounds: Normal heart sounds.    No gallop.  Pulmonary:     Effort: Pulmonary effort is normal. No respiratory distress.     Breath sounds: Normal breath sounds. No wheezing.     Comments: Diffusely distant bs  Chest:     Chest wall: No tenderness.  Abdominal:     General: Bowel sounds are normal. There is no distension or abdominal bruit.     Palpations: Abdomen is soft. There is no mass.     Tenderness: There is no abdominal tenderness.     Hernia: No hernia is present.  Genitourinary:    Comments: Breast exam L: No mass, nodules, thickening, tenderness, bulging, retraction, inflamation, nipple discharge or skin changes noted.  No axillary or clavicular LA.    Mastectomy site is clear of M or skin change on the R  Musculoskeletal:        General: No tenderness. Normal range of motion.     Cervical back: Normal range of motion and neck supple. No rigidity. No muscular tenderness.     Right lower leg: No edema.     Left lower leg: No edema.  Lymphadenopathy:     Cervical: No cervical adenopathy.  Skin:    General: Skin is warm and dry.     Coloration: Skin is not pale.     Findings: No erythema or rash.     Comments: Solar lentigines diffusely Some sks  Neurological:      Mental Status: She is alert. Mental status is at baseline.     Cranial Nerves: No cranial nerve deficit.     Motor: No abnormal muscle tone.     Coordination: Coordination normal.     Gait: Gait normal.     Deep Tendon Reflexes: Reflexes are normal and symmetric.  Psychiatric:        Mood and Affect: Mood normal.        Cognition and Memory: Cognition and memory normal.          Assessment & Plan:   Problem List Items Addressed This Visit       Musculoskeletal and Integument   Osteopenia    dexa 5/19 , ordered f/u  No falls or fx Taking ca and D  Walking for exercise  Chronic prednisone inc risk of OP         Other   Hyperlipidemia    Disc goals for lipids and reasons to control them Rev last labs with pt Rev low sat fat diet in detail Fair control with diet        Sarcoidosis    Struggling with pulm and cardiol effects  Seeing rheumatology and considering change to humira if she can get financial asst On mtx now with prednisone       Encounter for Medicare annual wellness exam - Primary    Reviewed health habits including diet and exercise and skin cancer prevention Reviewed appropriate screening tests for age  Also reviewed health mt list, fam hx and immunization status , as well as social and family history   See HPI Labs reviewed  Colonoscopy 2016, cologuard ordered (tortuous colon) Mammogram due -order put in (personal hx of mastectomy) Enc the shingrix vaccine if rheumatology approves and is affordable  Adv directive is utd  No cognitive concerns Nl hearing screen and eye/vision care is utd        Estrogen deficiency   Relevant Orders   DG Bone Density   Routine general medical examination at  a health care facility    Reviewed health habits including diet and exercise and skin cancer prevention Reviewed appropriate screening tests for age  Also reviewed health mt list, fam hx and immunization status , as well as social and family history    See HPI Labs reviewed  Colonoscopy 2016, cologuard ordered (tortuous colon) Mammogram due -order put in (personal hx of mastectomy) Enc the shingrix vaccine if rheumatology approves and is affordable  Adv directive is utd  No cognitive concerns Nl hearing screen and eye/vision care is utd        Encounter for screening mammogram for breast cancer   Relevant Orders   MM 3D SCREEN BREAST BILATERAL

## 2021-02-19 NOTE — Assessment & Plan Note (Signed)
Disc goals for lipids and reasons to control them Rev last labs with pt Rev low sat fat diet in detail Fair control with diet

## 2021-02-19 NOTE — Progress Notes (Signed)
Name: Judith [name] MRN: [id number] DOB: 02/12/1946     CONSULTATION DATE:02/19/2021  REFERRING MD :  Dr. Roxan Hockey  CHIEF COMPLAINT:   Follow-up sarcoid  STUDIES:  CT chest Jan 26, 2017 I have Independently reviewed images of  CT chest    Interpretation:B/l pulm nodules   Previous history 75 year old  abnormal CT chest with bilateral pulmonary nodules Patient has a diagnosis and history of sarcoidosis of the scalp In February 2018 patient developed a chronic cough patient was worked up for chest x-ray and a CT scan of the chest which revealed several pulmonary nodules Patient was then referred to cardiothoracic surgery for further evaluation Dr. 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 Patient sees ophthalmology on an annual basis to assess for sarcoid 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  6MWT test WNL ONO revelas signs hypoxia Placed on 2 L [place] oxygen therapy  patient diagnosed with sleep apnea with AHI of 13 123 events Lowest O2 sat was 75%  CC Follow-up OSA Follow-up sarcoid Follow-up hypoxia   HPI Diagnosis stage IV sarcoid Seems to be stable at this time Intermittent shortness of breath and dyspnea exertion  Hypoxia Continue oxygen at nighttime 2 L nasal cannula    OSA well controlled  Patient uses and benefis from therapy excellent report autoCPAP 5-13 cm h20 Excellent compliance report at 100% AHI is down to  0.5    CARDIAC SARCOIDOSIS Dx at [facility]-chronic prednisone transitioned to MTX therapy Patient will need prolonged immunotherapy indefinitely   No exacerbation at this time No evidence of heart failure at this time No evidence or signs of infection at this time No respiratory distress No fevers, chills, nausea, vomiting, diarrhea No evidence of lower extremity edema No evidence hemoptysis      Allergies  Allergen Reactions   Niacin And Related Other (See Comments)    Caused black and blue marks all over body   Meloxicam Rash      Review of Systems:  Gen:  Denies  fever, sweats, chills weight loss  HEENT: Denies blurred vision, double vision, ear pain, eye pain, hearing loss, nose bleeds, sore throat Cardiac:  No dizziness, chest pain or heaviness, chest tightness,edema, No JVD Resp:   No cough, -sputum production, -shortness of breath,-wheezing, -hemoptysis,  Other:  All other systems negative   BP 100/64 (BP Location: Left Arm, Patient Position: Sitting, Cuff Size: Normal)   Pulse 91   Temp 98.2 F (36.8 C) (Oral)   Ht 5' 1"  (1.549 m)   Wt 159 lb 3.2 oz (72.2 kg)   SpO2 97%   BMI 30.08 kg/m  Physical Examination:   General Appearance: No distress  Neuro:without focal findings,  speech normal,  HEENT: PERRLA, EOM intact.   Pulmonary: normal breath sounds, No wheezing.  CardiovascularNormal S1,S2.  No m/r/g.    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 with nocturnal hypoxia related to underlying sleep apnea which stage IV pulmonary sarcoid   Cardiac sarcoidosis Patient on chronic immunosuppressive therapy Patient had transition to methotrexate therapy Follow-up with [facility] cardiology Patient has follow-up appointment next week to evaluate Humira therapy  Sarcoidosis  pulmonary stage IV Continue oxygen as prescribed Patient sees [facility] and is on immunosuppressive therapy  OSA moderate Patient with excellent compliance report Will decrease pressure 5 to 10 cm water pressure Patient having hiccups Uses nasal pillows Benefits from CPAP therapy  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 along with OSA Continue oxygen as prescribed Patient is concerned that her app on her watch says that she has intermittent hypoxia She had an overnight pulse oximetry to determine that there is no significant hypoxia but she is worried and she would like to increase her oxygen levels to 4L [place] Patient needs oxygen for survival   COVID-19 INFECTION Patient advised to use mask I have advised that she is very likely that she did not have any more serious complications from [id number]    MEDICATION ADJUSTMENTS/LABS AND TESTS ORDERED: Continue immunosuppressive therapy Continue auto CPAP decrease 5 to 10cm of pressure Continue oxygen therapy as prescribed    CURRENT MEDICATIONS REVIEWED AT LENGTH WITH PATIENT TODAY   Patient satisfied with Plan of action and management. All questions answered  Follow-up in 1 year  TOTAL Time spent 32 minutes   Feiga Nadel Patricia Pesa, M.D.  [facility]  Medical Director [place] Director [facility] Cardio-Pulmonary Department

## 2021-02-19 NOTE — Patient Instructions (Addendum)
Consider some water exercise   Take care of yourself   I ordered your mammogram and bone density test  Please call to schedule them    We will sign you up for the cologuard program

## 2021-02-19 NOTE — Assessment & Plan Note (Addendum)
dexa 5/19 , ordered f/u  No falls or fx Taking ca and D  Walking for exercise  Chronic prednisone inc risk of OP

## 2021-02-19 NOTE — Patient Instructions (Signed)
Continue immunosuppressive therapy Continue auto CPAP decrease 5 to 11 cm of pressure Continue oxygen therapy as prescribed  Follow up with [facility]

## 2021-02-19 NOTE — Assessment & Plan Note (Signed)
Reviewed health habits including diet and exercise and skin cancer prevention Reviewed appropriate screening tests for age  Also reviewed health mt list, fam hx and immunization status , as well as social and family history   See HPI Labs reviewed  Colonoscopy 2016, cologuard ordered (tortuous colon) Mammogram due -order put in (personal hx of mastectomy) Enc the shingrix vaccine if rheumatology approves and is affordable  Adv directive is utd  No cognitive concerns Nl hearing screen and eye/vision care is utd

## 2021-02-26 DIAGNOSIS — D8685 Sarcoid myocarditis: Secondary | ICD-10-CM | POA: Diagnosis not present

## 2021-02-26 DIAGNOSIS — D869 Sarcoidosis, unspecified: Secondary | ICD-10-CM | POA: Diagnosis not present

## 2021-02-26 DIAGNOSIS — Z95818 Presence of other cardiac implants and grafts: Secondary | ICD-10-CM | POA: Diagnosis not present

## 2021-03-03 DIAGNOSIS — D869 Sarcoidosis, unspecified: Secondary | ICD-10-CM | POA: Diagnosis not present

## 2021-03-04 DIAGNOSIS — Z1211 Encounter for screening for malignant neoplasm of colon: Secondary | ICD-10-CM | POA: Diagnosis not present

## 2021-03-04 DIAGNOSIS — Z1212 Encounter for screening for malignant neoplasm of rectum: Secondary | ICD-10-CM | POA: Diagnosis not present

## 2021-03-04 LAB — COLOGUARD: Cologuard: POSITIVE — AB

## 2021-03-10 ENCOUNTER — Telehealth: Payer: Self-pay | Admitting: *Deleted

## 2021-03-10 NOTE — Telephone Encounter (Signed)
Neln at [employer] called stating that they had faxed over an abnormal colorguard result to Dr. Glori Bickers.  Faxed result were place in Dr. Marliss Coots in box by Rexene Agent.

## 2021-03-10 NOTE — Telephone Encounter (Signed)
Dutch Quint, NP sent me back saying:  Please advise patient that Cologuard results were positive. This suggest additional investigation to r/o colon malignancy. I will place a referral to GI if she is ok with it. Let me know.      Pt notified of positive cologuard results pt said she can't get a colonoscopy due to a "torturous twisted colon" pt said she tried to get one years ago and that's what they told her when it was unsuccessful. Pt has already been referred to [facility] due to other issues and she has a MRI scheduled tomorrow and a PET scan scheduled later this year. Pt said she will check with new GI doc at appt next week to let them know positive cologuard results and see what they want to do. I offered to send them a copy of results so they will have them but pt doesn't know the name of the GI doc and she wasn't in a place to check. Pt advise that [facility] has Epic (same computer sxs as [place]) and they will be able to see this note. Pt will wait until she talks to her new GI doc and see what they want to do.   FYI to PCP

## 2021-03-10 NOTE — Telephone Encounter (Signed)
Aware-will wait to hear

## 2021-03-12 DIAGNOSIS — K862 Cyst of pancreas: Secondary | ICD-10-CM | POA: Diagnosis not present

## 2021-03-12 DIAGNOSIS — K760 Fatty (change of) liver, not elsewhere classified: Secondary | ICD-10-CM | POA: Diagnosis not present

## 2021-03-20 DIAGNOSIS — K862 Cyst of pancreas: Secondary | ICD-10-CM | POA: Diagnosis not present

## 2021-03-28 DIAGNOSIS — C4402 Squamous cell carcinoma of skin of lip: Secondary | ICD-10-CM | POA: Diagnosis not present

## 2021-04-03 DIAGNOSIS — D869 Sarcoidosis, unspecified: Secondary | ICD-10-CM | POA: Diagnosis not present

## 2021-04-07 DIAGNOSIS — G4733 Obstructive sleep apnea (adult) (pediatric): Secondary | ICD-10-CM | POA: Diagnosis not present

## 2021-04-10 ENCOUNTER — Other Ambulatory Visit: Payer: Self-pay | Admitting: Family Medicine

## 2021-04-10 DIAGNOSIS — Z1231 Encounter for screening mammogram for malignant neoplasm of breast: Secondary | ICD-10-CM

## 2021-04-14 ENCOUNTER — Ambulatory Visit (INDEPENDENT_AMBULATORY_CARE_PROVIDER_SITE_OTHER)
Admission: RE | Admit: 2021-04-14 | Discharge: 2021-04-14 | Disposition: A | Discharge status: Home / Self Care | Payer: Medicare HMO | Source: Ambulatory Visit | Attending: Family Medicine | Admitting: Family Medicine

## 2021-04-14 ENCOUNTER — Other Ambulatory Visit: Payer: Self-pay

## 2021-04-14 DIAGNOSIS — E2839 Other primary ovarian failure: Secondary | ICD-10-CM

## 2021-04-18 DIAGNOSIS — L570 Actinic keratosis: Secondary | ICD-10-CM | POA: Diagnosis not present

## 2021-04-18 DIAGNOSIS — X32XXXA Exposure to sunlight, initial encounter: Secondary | ICD-10-CM | POA: Diagnosis not present

## 2021-04-18 DIAGNOSIS — D485 Neoplasm of uncertain behavior of skin: Secondary | ICD-10-CM | POA: Diagnosis not present

## 2021-04-18 DIAGNOSIS — C44311 Basal cell carcinoma of skin of nose: Secondary | ICD-10-CM | POA: Diagnosis not present

## 2021-04-23 ENCOUNTER — Other Ambulatory Visit: Payer: Self-pay

## 2021-04-23 ENCOUNTER — Ambulatory Visit
Admission: RE | Admit: 2021-04-23 | Discharge: 2021-04-23 | Disposition: A | Discharge status: Home / Self Care | Payer: Medicare HMO | Source: Ambulatory Visit | Attending: Family Medicine | Admitting: Family Medicine

## 2021-04-23 DIAGNOSIS — Z1231 Encounter for screening mammogram for malignant neoplasm of breast: Secondary | ICD-10-CM | POA: Diagnosis not present

## 2021-05-04 DIAGNOSIS — D869 Sarcoidosis, unspecified: Secondary | ICD-10-CM | POA: Diagnosis not present

## 2021-06-03 DIAGNOSIS — D869 Sarcoidosis, unspecified: Secondary | ICD-10-CM | POA: Diagnosis not present

## 2021-07-01 DIAGNOSIS — Z95818 Presence of other cardiac implants and grafts: Secondary | ICD-10-CM | POA: Diagnosis not present

## 2021-07-02 DIAGNOSIS — Z23 Encounter for immunization: Secondary | ICD-10-CM | POA: Diagnosis not present

## 2021-07-02 DIAGNOSIS — D8685 Sarcoid myocarditis: Secondary | ICD-10-CM | POA: Diagnosis not present

## 2021-07-02 DIAGNOSIS — R9439 Abnormal result of other cardiovascular function study: Secondary | ICD-10-CM | POA: Diagnosis not present

## 2021-07-02 DIAGNOSIS — E785 Hyperlipidemia, unspecified: Secondary | ICD-10-CM | POA: Diagnosis not present

## 2021-07-02 DIAGNOSIS — J45909 Unspecified asthma, uncomplicated: Secondary | ICD-10-CM | POA: Diagnosis not present

## 2021-07-04 DIAGNOSIS — D869 Sarcoidosis, unspecified: Secondary | ICD-10-CM | POA: Diagnosis not present

## 2021-07-04 DIAGNOSIS — C44311 Basal cell carcinoma of skin of nose: Secondary | ICD-10-CM | POA: Diagnosis not present

## 2021-07-09 DIAGNOSIS — D8685 Sarcoid myocarditis: Secondary | ICD-10-CM | POA: Diagnosis not present

## 2021-07-09 DIAGNOSIS — R000 Tachycardia, unspecified: Secondary | ICD-10-CM | POA: Diagnosis not present

## 2021-07-09 DIAGNOSIS — G4733 Obstructive sleep apnea (adult) (pediatric): Secondary | ICD-10-CM | POA: Diagnosis not present

## 2021-07-22 DIAGNOSIS — H2513 Age-related nuclear cataract, bilateral: Secondary | ICD-10-CM | POA: Diagnosis not present

## 2021-07-30 DIAGNOSIS — D8685 Sarcoid myocarditis: Secondary | ICD-10-CM | POA: Diagnosis not present

## 2021-07-30 DIAGNOSIS — Z79899 Other long term (current) drug therapy: Secondary | ICD-10-CM | POA: Diagnosis not present

## 2021-08-03 DIAGNOSIS — D869 Sarcoidosis, unspecified: Secondary | ICD-10-CM | POA: Diagnosis not present

## 2021-08-21 DIAGNOSIS — D225 Melanocytic nevi of trunk: Secondary | ICD-10-CM | POA: Diagnosis not present

## 2021-08-21 DIAGNOSIS — D2271 Melanocytic nevi of right lower limb, including hip: Secondary | ICD-10-CM | POA: Diagnosis not present

## 2021-08-21 DIAGNOSIS — L570 Actinic keratosis: Secondary | ICD-10-CM | POA: Diagnosis not present

## 2021-08-21 DIAGNOSIS — Z85828 Personal history of other malignant neoplasm of skin: Secondary | ICD-10-CM | POA: Diagnosis not present

## 2021-08-21 DIAGNOSIS — Z85820 Personal history of malignant melanoma of skin: Secondary | ICD-10-CM | POA: Diagnosis not present

## 2021-08-21 DIAGNOSIS — D2261 Melanocytic nevi of right upper limb, including shoulder: Secondary | ICD-10-CM | POA: Diagnosis not present

## 2021-08-21 DIAGNOSIS — D2262 Melanocytic nevi of left upper limb, including shoulder: Secondary | ICD-10-CM | POA: Diagnosis not present

## 2021-09-03 DIAGNOSIS — D869 Sarcoidosis, unspecified: Secondary | ICD-10-CM | POA: Diagnosis not present

## 2021-09-29 DIAGNOSIS — C50911 Malignant neoplasm of unspecified site of right female breast: Secondary | ICD-10-CM | POA: Diagnosis not present

## 2021-10-01 DIAGNOSIS — Z23 Encounter for immunization: Secondary | ICD-10-CM | POA: Diagnosis not present

## 2021-10-01 DIAGNOSIS — Z95818 Presence of other cardiac implants and grafts: Secondary | ICD-10-CM | POA: Diagnosis not present

## 2021-10-01 DIAGNOSIS — R000 Tachycardia, unspecified: Secondary | ICD-10-CM | POA: Diagnosis not present

## 2021-10-01 DIAGNOSIS — D8685 Sarcoid myocarditis: Secondary | ICD-10-CM | POA: Diagnosis not present

## 2021-10-01 DIAGNOSIS — Z79899 Other long term (current) drug therapy: Secondary | ICD-10-CM | POA: Diagnosis not present

## 2021-10-01 DIAGNOSIS — Z87891 Personal history of nicotine dependence: Secondary | ICD-10-CM | POA: Diagnosis not present

## 2021-10-04 DIAGNOSIS — D869 Sarcoidosis, unspecified: Secondary | ICD-10-CM | POA: Diagnosis not present

## 2021-10-10 DIAGNOSIS — G4733 Obstructive sleep apnea (adult) (pediatric): Secondary | ICD-10-CM | POA: Diagnosis not present

## 2021-10-24 DIAGNOSIS — M4310 Spondylolisthesis, site unspecified: Secondary | ICD-10-CM | POA: Diagnosis not present

## 2021-10-24 DIAGNOSIS — M5451 Vertebrogenic low back pain: Secondary | ICD-10-CM | POA: Diagnosis not present

## 2021-10-29 DIAGNOSIS — R9439 Abnormal result of other cardiovascular function study: Secondary | ICD-10-CM | POA: Diagnosis not present

## 2021-10-29 DIAGNOSIS — D8685 Sarcoid myocarditis: Secondary | ICD-10-CM | POA: Diagnosis not present

## 2021-10-29 DIAGNOSIS — E785 Hyperlipidemia, unspecified: Secondary | ICD-10-CM | POA: Diagnosis not present

## 2021-11-01 DIAGNOSIS — D869 Sarcoidosis, unspecified: Secondary | ICD-10-CM | POA: Diagnosis not present

## 2021-11-11 DIAGNOSIS — G4733 Obstructive sleep apnea (adult) (pediatric): Secondary | ICD-10-CM | POA: Diagnosis not present

## 2021-11-14 DIAGNOSIS — M5451 Vertebrogenic low back pain: Secondary | ICD-10-CM | POA: Diagnosis not present

## 2021-11-18 DIAGNOSIS — M5451 Vertebrogenic low back pain: Secondary | ICD-10-CM | POA: Diagnosis not present

## 2021-11-21 DIAGNOSIS — M5451 Vertebrogenic low back pain: Secondary | ICD-10-CM | POA: Diagnosis not present

## 2021-11-24 DIAGNOSIS — C44311 Basal cell carcinoma of skin of nose: Secondary | ICD-10-CM | POA: Diagnosis not present

## 2021-11-25 DIAGNOSIS — M5451 Vertebrogenic low back pain: Secondary | ICD-10-CM | POA: Diagnosis not present

## 2021-11-25 DIAGNOSIS — D8685 Sarcoid myocarditis: Secondary | ICD-10-CM | POA: Diagnosis not present

## 2021-11-28 DIAGNOSIS — M5451 Vertebrogenic low back pain: Secondary | ICD-10-CM | POA: Diagnosis not present

## 2021-12-02 DIAGNOSIS — D869 Sarcoidosis, unspecified: Secondary | ICD-10-CM | POA: Diagnosis not present

## 2021-12-02 DIAGNOSIS — M5451 Vertebrogenic low back pain: Secondary | ICD-10-CM | POA: Diagnosis not present

## 2021-12-10 DIAGNOSIS — M5451 Vertebrogenic low back pain: Secondary | ICD-10-CM | POA: Diagnosis not present

## 2021-12-12 DIAGNOSIS — M5451 Vertebrogenic low back pain: Secondary | ICD-10-CM | POA: Diagnosis not present

## 2021-12-23 DIAGNOSIS — M5451 Vertebrogenic low back pain: Secondary | ICD-10-CM | POA: Diagnosis not present

## 2021-12-23 DIAGNOSIS — M5459 Other low back pain: Secondary | ICD-10-CM | POA: Diagnosis not present

## 2021-12-31 DIAGNOSIS — Z95818 Presence of other cardiac implants and grafts: Secondary | ICD-10-CM | POA: Diagnosis not present

## 2021-12-31 DIAGNOSIS — M5416 Radiculopathy, lumbar region: Secondary | ICD-10-CM | POA: Diagnosis not present

## 2022-01-01 DIAGNOSIS — D869 Sarcoidosis, unspecified: Secondary | ICD-10-CM | POA: Diagnosis not present

## 2022-01-02 DIAGNOSIS — Z95818 Presence of other cardiac implants and grafts: Secondary | ICD-10-CM | POA: Diagnosis not present

## 2022-01-05 DIAGNOSIS — M5451 Vertebrogenic low back pain: Secondary | ICD-10-CM | POA: Diagnosis not present

## 2022-01-09 DIAGNOSIS — R000 Tachycardia, unspecified: Secondary | ICD-10-CM | POA: Diagnosis not present

## 2022-01-09 DIAGNOSIS — I4510 Unspecified right bundle-branch block: Secondary | ICD-10-CM | POA: Diagnosis not present

## 2022-01-09 DIAGNOSIS — R9431 Abnormal electrocardiogram [ECG] [EKG]: Secondary | ICD-10-CM | POA: Diagnosis not present

## 2022-01-09 DIAGNOSIS — Z95818 Presence of other cardiac implants and grafts: Secondary | ICD-10-CM | POA: Diagnosis not present

## 2022-01-09 DIAGNOSIS — D8685 Sarcoid myocarditis: Secondary | ICD-10-CM | POA: Diagnosis not present

## 2022-01-22 DIAGNOSIS — M5416 Radiculopathy, lumbar region: Secondary | ICD-10-CM | POA: Diagnosis not present

## 2022-01-28 DIAGNOSIS — R5383 Other fatigue: Secondary | ICD-10-CM | POA: Diagnosis not present

## 2022-01-28 DIAGNOSIS — M450 Ankylosing spondylitis of multiple sites in spine: Secondary | ICD-10-CM | POA: Diagnosis not present

## 2022-01-28 DIAGNOSIS — M4316 Spondylolisthesis, lumbar region: Secondary | ICD-10-CM | POA: Diagnosis not present

## 2022-01-28 DIAGNOSIS — D869 Sarcoidosis, unspecified: Secondary | ICD-10-CM | POA: Diagnosis not present

## 2022-01-28 DIAGNOSIS — Z87891 Personal history of nicotine dependence: Secondary | ICD-10-CM | POA: Diagnosis not present

## 2022-01-28 DIAGNOSIS — Z79899 Other long term (current) drug therapy: Secondary | ICD-10-CM | POA: Diagnosis not present

## 2022-01-28 DIAGNOSIS — R000 Tachycardia, unspecified: Secondary | ICD-10-CM | POA: Diagnosis not present

## 2022-01-28 DIAGNOSIS — D8685 Sarcoid myocarditis: Secondary | ICD-10-CM | POA: Diagnosis not present

## 2022-01-28 DIAGNOSIS — R197 Diarrhea, unspecified: Secondary | ICD-10-CM | POA: Diagnosis not present

## 2022-02-01 DIAGNOSIS — D869 Sarcoidosis, unspecified: Secondary | ICD-10-CM | POA: Diagnosis not present

## 2022-02-04 ENCOUNTER — Encounter: Payer: Self-pay | Admitting: Internal Medicine

## 2022-02-04 ENCOUNTER — Ambulatory Visit: Payer: Medicare HMO | Admitting: Internal Medicine

## 2022-02-04 VITALS — BP 128/80 | HR 73 | Temp 98.3°F | Ht 61.0 in | Wt 163.6 lb

## 2022-02-04 DIAGNOSIS — G4733 Obstructive sleep apnea (adult) (pediatric): Secondary | ICD-10-CM | POA: Diagnosis not present

## 2022-02-04 DIAGNOSIS — D869 Sarcoidosis, unspecified: Secondary | ICD-10-CM

## 2022-02-04 DIAGNOSIS — J189 Pneumonia, unspecified organism: Secondary | ICD-10-CM | POA: Diagnosis not present

## 2022-02-04 MED ORDER — DOXYCYCLINE HYCLATE 100 MG PO TABS: 100.0000 mg | ORAL_TABLET | Freq: Two times a day (BID) | ORAL | 1 refills | Status: DC

## 2022-02-04 NOTE — Patient Instructions (Signed)
Start antibiotics with doxycycline 100 mg twice a day for 7 days  Check overnight pulse oximetry on CPAP

## 2022-02-04 NOTE — Progress Notes (Signed)
Name: Judith [name] MRN: [id number] DOB: October 31, 1945     CONSULTATION DATE:02/04/2022  REFERRING MD :  Dr. Roxan Hockey   STUDIES:  CT chest Jan 26, 2017 I have Independently reviewed images of  CT chest    Interpretation:B/l pulm nodules   Previous history 76 year old  abnormal CT chest with bilateral pulmonary nodules Patient has a diagnosis and history of sarcoidosis of the scalp In February 2018 patient developed a chronic cough patient was worked up for chest x-ray and a CT scan of the chest which revealed several pulmonary nodules Patient was then referred to cardiothoracic surgery for further evaluation Dr. 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 Patient sees ophthalmology on an annual basis to assess for sarcoid 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  6MWT test WNL ONO revelas signs hypoxia Placed on 2 L [place] oxygen therapy  patient diagnosed with sleep apnea with AHI of 13 123 events Lowest O2 sat was 75%  CC Follow-up OSA Follow-up sarcoid Follow-up hypoxia   HPI Diagnosis stage IV sarcoid Seems to be stable at this time Intermittent shortness of breath and dyspnea exertion Sees [facility] for cardiac sarcoid Immunosuppressive therapy with Humira Chronic prednisone therapy at 7.5 mg daily   Hypoxia Continue oxygen at nighttime 2 L nasal cannula Patient has a personal monitor that which reveals some significant dips in  her oxygen levels Will need overnight pulse oximetry on CPAP to verify findings   OSA well controlled  Well-controlled sleep apnea Excellent compliance report Discussed in detail with patient Patient uses and benefis from therapy excellent report autoCPAP 5-13 cm h20 Excellent compliance report at 100% AHI is down to 0.5    CARDIAC SARCOIDOSIS Dx at [facility] Therapy-chronic prednisone transitioned to MTX therapy Patient will need prolonged immunotherapy indefinitely   No evidence of heart failure at this time No evidence or signs of infection at this time No respiratory distress +fevers, No evidence of lower extremity edema No evidence hemoptysis  Mild fevers over the last 1 month Recommend antibiotic with doxycycline     Allergies  Allergen Reactions   Niacin And Related Other (See Comments)    Caused black and blue marks all over body   Meloxicam Rash      Review of Systems:  Gen:  Denies  fever, sweats, chills weight loss  HEENT: Denies blurred vision, double vision, ear pain, eye pain, hearing loss, nose bleeds, sore throat Cardiac:  No dizziness, chest pain or heaviness, chest tightness,edema, No JVD Resp:   No cough, -sputum production, -shortness of breath,-wheezing, -hemoptysis,  Other:  All other systems negative   BP 128/80 (BP Location: Left Arm, Cuff Size: Normal)   Pulse 73   Temp 98.3 F (36.8 C) (Temporal)   Ht 5' 1"  (1.549 m)   Wt 163 lb 9.6 oz (74.2 kg)   SpO2 93%   BMI 30.91 kg/m  Physical Examination:   General Appearance: No distress  Neuro:without focal findings,  speech normal,  HEENT: PERRLA, EOM intact.   Pulmonary:  normal breath sounds, No wheezing.  CardiovascularNormal S1,S2.  No m/r/g.    ALL OTHER ROS ARE NEGATIVE                 ASSESSMENT / PLAN:    76 year old pleasant white female seen today for follow-up assessment for pulmonary sarcoid stage IV in the setting of cardiac sarcoidosis with nocturnal hypoxia  related to underlying sleep apnea which stage IV pulmonary sarcoid   Cardiac sarcoidosis Patient on chronic immunosuppressive therapy Follow-up with [facility] On Humira, chronic prednisone   Sarcoidosis pulmonary stage IV Continue oxygen as prescribed Patient sees [facility] and is on immunosuppressive therapy Will need overnight pulse oximetry on CPAP  OSA moderate Patient with excellent compliance report Uses nasal pillows Benefits from CPAP therapy  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 along with OSA Continue oxygen as prescribed Patient is concerned that her app on her watch says that she has intermittent hypoxia She had an overnight pulse oximetry to determine that there is no significant hypoxia but she is worried and she would like to increase her oxygen levels to 4L [place] Patient needs oxygen for survival I will need overnight pulse oximetry to assess for nocturnal hypoxia on CPAP     MEDICATION ADJUSTMENTS/LABS AND TESTS ORDERED: Continue immunosuppressive therapy Continue auto CPAP  Continue oxygen therapy as prescribed  Check overnight pulse oximetry for nocturnal hypoxia Doxycycline therapy for atypical pneumonia physical examination is within normal limits   CURRENT MEDICATIONS REVIEWED AT LENGTH WITH PATIENT TODAY   Patient satisfied with Plan of action and management. All questions answered  Follow-up in 6 months  Total time spent 32 minutes   Corrin Parker, M.D.  [facility]  Medical Director [place] Director [facility] Cardio-Pulmonary Department

## 2022-02-09 ENCOUNTER — Encounter: Payer: Self-pay | Admitting: Internal Medicine

## 2022-02-09 DIAGNOSIS — H16223 Keratoconjunctivitis sicca, not specified as Sjogren's, bilateral: Secondary | ICD-10-CM | POA: Diagnosis not present

## 2022-02-11 DIAGNOSIS — R0602 Shortness of breath: Secondary | ICD-10-CM | POA: Diagnosis not present

## 2022-02-15 ENCOUNTER — Telehealth: Payer: Self-pay | Admitting: Family Medicine

## 2022-02-15 DIAGNOSIS — E7800 Pure hypercholesterolemia, unspecified: Secondary | ICD-10-CM

## 2022-02-15 DIAGNOSIS — Z0000 Encounter for general adult medical examination without abnormal findings: Secondary | ICD-10-CM

## 2022-02-15 DIAGNOSIS — K760 Fatty (change of) liver, not elsewhere classified: Secondary | ICD-10-CM

## 2022-02-15 NOTE — Telephone Encounter (Signed)
-----   Message from Ellamae Sia sent at 02/02/2022 12:18 PM EDT ----- Regarding: Lab orders for Monday, 6.19.23 Patient is scheduled for CPX labs, please order future labs, Thanks , Karna Christmas

## 2022-02-16 ENCOUNTER — Other Ambulatory Visit (INDEPENDENT_AMBULATORY_CARE_PROVIDER_SITE_OTHER): Payer: Medicare HMO

## 2022-02-16 DIAGNOSIS — K760 Fatty (change of) liver, not elsewhere classified: Secondary | ICD-10-CM

## 2022-02-16 DIAGNOSIS — E7800 Pure hypercholesterolemia, unspecified: Secondary | ICD-10-CM

## 2022-02-16 DIAGNOSIS — Z0000 Encounter for general adult medical examination without abnormal findings: Secondary | ICD-10-CM | POA: Diagnosis not present

## 2022-02-16 LAB — COMPREHENSIVE METABOLIC PANEL
ALT: 19 U/L (ref 0–35)
AST: 14 U/L (ref 0–37)
Albumin: 3.6 g/dL (ref 3.5–5.2)
Alkaline Phosphatase: 40 U/L (ref 39–117)
BUN: 16 mg/dL (ref 6–23)
CO2: 26 mEq/L (ref 19–32)
Calcium: 8.7 mg/dL (ref 8.4–10.5)
Chloride: 109 mEq/L (ref 96–112)
Creatinine, Ser: 0.88 mg/dL (ref 0.40–1.20)
GFR: 63.58 mL/min (ref >60.00)
Glucose, Bld: 88 mg/dL (ref 70–99)
Potassium: 4 mEq/L (ref 3.5–5.1)
Sodium: 143 mEq/L (ref 135–145)
Total Bilirubin: 0.4 mg/dL (ref 0.2–1.2)
Total Protein: 5.8 g/dL — ABNORMAL LOW (ref 6.0–8.3)

## 2022-02-16 LAB — CBC WITH DIFFERENTIAL/PLATELET
Basophils Absolute: 0 10*3/uL (ref 0.0–0.1)
Basophils Relative: 0.5 % (ref 0.0–3.0)
Eosinophils Absolute: 0.1 10*3/uL (ref 0.0–0.7)
Eosinophils Relative: 1.3 % (ref 0.0–5.0)
HCT: 40.2 % (ref 36.0–46.0)
Hemoglobin: 13.1 g/dL (ref 12.0–15.0)
Lymphocytes Relative: 33.6 % (ref 12.0–46.0)
Lymphs Abs: 2.4 10*3/uL (ref 0.7–4.0)
MCHC: 32.6 g/dL (ref 30.0–36.0)
MCV: 94.6 fl (ref 78.0–100.0)
Monocytes Absolute: 0.6 10*3/uL (ref 0.1–1.0)
Monocytes Relative: 9 % (ref 3.0–12.0)
Neutro Abs: 3.9 10*3/uL (ref 1.4–7.7)
Neutrophils Relative %: 55.6 % (ref 43.0–77.0)
Platelets: 299 10*3/uL (ref 150.0–400.0)
RBC: 4.25 Mil/uL (ref 3.87–5.11)
RDW: 14 % (ref 11.5–15.5)
WBC: 7.1 10*3/uL (ref 4.0–10.5)

## 2022-02-16 LAB — LIPID PANEL
Cholesterol: 199 mg/dL (ref 0–200)
HDL: 53.3 mg/dL (ref >39.00)
LDL Cholesterol: 110 mg/dL — ABNORMAL HIGH (ref 0–99)
NonHDL: 145.77
Total CHOL/HDL Ratio: 4
Triglycerides: 179 mg/dL — ABNORMAL HIGH (ref 0.0–149.0)
VLDL: 35.8 mg/dL (ref 0.0–40.0)

## 2022-02-16 LAB — TSH: TSH: 3.41 u[IU]/mL (ref 0.35–5.50)

## 2022-02-20 ENCOUNTER — Ambulatory Visit (INDEPENDENT_AMBULATORY_CARE_PROVIDER_SITE_OTHER): Payer: Medicare HMO

## 2022-02-20 VITALS — Ht 61.0 in | Wt 159.0 lb

## 2022-02-20 DIAGNOSIS — H2513 Age-related nuclear cataract, bilateral: Secondary | ICD-10-CM | POA: Diagnosis not present

## 2022-02-20 DIAGNOSIS — Z0000 Encounter for general adult medical examination without abnormal findings: Secondary | ICD-10-CM | POA: Diagnosis not present

## 2022-02-23 ENCOUNTER — Ambulatory Visit (INDEPENDENT_AMBULATORY_CARE_PROVIDER_SITE_OTHER): Payer: Medicare HMO | Admitting: Family Medicine

## 2022-02-23 ENCOUNTER — Encounter: Payer: Self-pay | Admitting: Family Medicine

## 2022-02-23 VITALS — BP 110/60 | HR 75 | Ht 62.0 in | Wt 164.0 lb

## 2022-02-23 DIAGNOSIS — R195 Other fecal abnormalities: Secondary | ICD-10-CM

## 2022-02-23 DIAGNOSIS — Z0000 Encounter for general adult medical examination without abnormal findings: Secondary | ICD-10-CM | POA: Diagnosis not present

## 2022-02-23 DIAGNOSIS — D126 Benign neoplasm of colon, unspecified: Secondary | ICD-10-CM

## 2022-02-23 DIAGNOSIS — E7800 Pure hypercholesterolemia, unspecified: Secondary | ICD-10-CM | POA: Diagnosis not present

## 2022-02-23 DIAGNOSIS — M4316 Spondylolisthesis, lumbar region: Secondary | ICD-10-CM | POA: Diagnosis not present

## 2022-02-23 DIAGNOSIS — M8589 Other specified disorders of bone density and structure, multiple sites: Secondary | ICD-10-CM

## 2022-02-23 DIAGNOSIS — D869 Sarcoidosis, unspecified: Secondary | ICD-10-CM | POA: Diagnosis not present

## 2022-02-23 MED ORDER — CYCLOBENZAPRINE HCL 10 MG PO TABS: 10.0000 mg | ORAL_TABLET | Freq: Three times a day (TID) | ORAL | 1 refills | Status: DC | PRN

## 2022-02-23 MED ORDER — METHOCARBAMOL 500 MG PO TABS: ORAL_TABLET | ORAL | 5 refills | Status: DC

## 2022-02-23 NOTE — Assessment & Plan Note (Signed)
cologuard is negative Pt states she cannot have a colonoscopy   Encouraged her strongly to discuss with her GI

## 2022-02-23 NOTE — Assessment & Plan Note (Signed)
In 02/2021 Pt does not think she is a candidate for colonoscopy due to tortuous colon  Encouraged her strongly to talk to her GI doctor  She does get regular PET scans that have not shown anything

## 2022-02-24 ENCOUNTER — Telehealth: Payer: Self-pay

## 2022-02-24 DIAGNOSIS — G4733 Obstructive sleep apnea (adult) (pediatric): Secondary | ICD-10-CM

## 2022-02-26 ENCOUNTER — Telehealth: Payer: Self-pay | Admitting: Internal Medicine

## 2022-02-26 DIAGNOSIS — C44311 Basal cell carcinoma of skin of nose: Secondary | ICD-10-CM | POA: Diagnosis not present

## 2022-02-26 DIAGNOSIS — L570 Actinic keratosis: Secondary | ICD-10-CM | POA: Diagnosis not present

## 2022-02-26 DIAGNOSIS — D225 Melanocytic nevi of trunk: Secondary | ICD-10-CM | POA: Diagnosis not present

## 2022-02-26 DIAGNOSIS — D2261 Melanocytic nevi of right upper limb, including shoulder: Secondary | ICD-10-CM | POA: Diagnosis not present

## 2022-02-26 DIAGNOSIS — D485 Neoplasm of uncertain behavior of skin: Secondary | ICD-10-CM | POA: Diagnosis not present

## 2022-02-26 DIAGNOSIS — D2272 Melanocytic nevi of left lower limb, including hip: Secondary | ICD-10-CM | POA: Diagnosis not present

## 2022-02-26 DIAGNOSIS — Z85820 Personal history of malignant melanoma of skin: Secondary | ICD-10-CM | POA: Diagnosis not present

## 2022-02-26 NOTE — Telephone Encounter (Signed)
Judith [name] mailed to address on file.  Patient is aware and voiced her understanding.  Nothing further needed.

## 2022-03-03 DIAGNOSIS — D869 Sarcoidosis, unspecified: Secondary | ICD-10-CM | POA: Diagnosis not present

## 2022-03-04 DIAGNOSIS — M48061 Spinal stenosis, lumbar region without neurogenic claudication: Secondary | ICD-10-CM | POA: Diagnosis not present

## 2022-03-04 DIAGNOSIS — M419 Scoliosis, unspecified: Secondary | ICD-10-CM | POA: Diagnosis not present

## 2022-03-04 DIAGNOSIS — M79604 Pain in right leg: Secondary | ICD-10-CM | POA: Diagnosis not present

## 2022-03-04 DIAGNOSIS — M546 Pain in thoracic spine: Secondary | ICD-10-CM | POA: Diagnosis not present

## 2022-03-04 DIAGNOSIS — M4316 Spondylolisthesis, lumbar region: Secondary | ICD-10-CM | POA: Diagnosis not present

## 2022-03-04 DIAGNOSIS — M79605 Pain in left leg: Secondary | ICD-10-CM | POA: Diagnosis not present

## 2022-03-06 ENCOUNTER — Telehealth: Payer: Self-pay | Admitting: Internal Medicine

## 2022-03-06 NOTE — Telephone Encounter (Signed)
Spoke to patient and relayed below message/recommendations. She will purchase IS.  She will contact our office if sooner appt if sx worsen. Nothing further needed.

## 2022-03-06 NOTE — Telephone Encounter (Signed)
I see she had imaging of her spine on 7/5, this showed perihilar and lower lung atelectasis. Nothing acutely concerning. Encourage deep breathing exercise, use IS 10/hour if she has one. Please send to Dr. Mortimer Fries for review. Should have visit set up if having persistent or worsening dyspnea symptoms.

## 2022-03-06 NOTE — Telephone Encounter (Signed)
Spoke to patient.  She stated that she had [facility](care [facility]). She is concerned about findings and would like this reviewed by Dr. Mortimer Fries.  She stated that she had a low grade fever 43moago. She is concerned that this may had been related to CXR finding. C/o with exertion and dry cough at times prod with white sputum. Denied f/c/s or additional sx.   Beth, please advise. Dr. KMortimer Friesis unavailable

## 2022-03-24 ENCOUNTER — Encounter: Payer: Self-pay | Admitting: Emergency Medicine

## 2022-03-24 ENCOUNTER — Telehealth: Payer: Self-pay | Admitting: Family Medicine

## 2022-03-24 ENCOUNTER — Ambulatory Visit
Admission: EM | Admit: 2022-03-24 | Discharge: 2022-03-24 | Disposition: A | Discharge status: Home / Self Care | Payer: Medicare HMO | Attending: Emergency Medicine | Admitting: Emergency Medicine

## 2022-03-24 DIAGNOSIS — R519 Headache, unspecified: Secondary | ICD-10-CM

## 2022-03-24 DIAGNOSIS — S0006XA Insect bite (nonvenomous) of scalp, initial encounter: Secondary | ICD-10-CM

## 2022-03-24 DIAGNOSIS — R509 Fever, unspecified: Secondary | ICD-10-CM | POA: Diagnosis not present

## 2022-03-24 DIAGNOSIS — W57XXXA Bitten or stung by nonvenomous insect and other nonvenomous arthropods, initial encounter: Secondary | ICD-10-CM | POA: Diagnosis not present

## 2022-03-24 MED ORDER — DOXYCYCLINE HYCLATE 100 MG PO CAPS: 100.0000 mg | ORAL_CAPSULE | Freq: Two times a day (BID) | ORAL | 0 refills | Status: AC

## 2022-03-24 NOTE — Telephone Encounter (Signed)
[facility] - Client TELEPHONE ADVICE RECORD [facility] Patient Name: Judith [name] Gender: Female DOB: 09-02-1945 Age: 76 Y 1 M 2 D Return Phone Number: [phone] (Primary), [phone] (Secondary) Address: [place] [address] Client [facility] - Client Client Site [place] Provider Glori Bickers, Roque Lias - MD Contact Type Call Who Is Calling Patient / Member / Family / Caregiver Call Type Triage / Clinical Caller Name Thara Searing Relationship To Patient Spouse Return Phone Number [phone] (Primary) Chief Complaint Lymph Node Swelling Reason for Call Symptomatic / Request for [place] states his wife has a tick in her head on Saturday and now has lymph node swelling and a fever of 101.7 Translation No Nurse Assessment Nurse: Ronnald Ramp, RN, Miranda Date/Time (Eastern Time): 03/24/2022 8:58:03 AM Confirm and document reason for call. If symptomatic, describe symptoms. ---Spoke with caller, she states she had a tick removed from her ear 1 week ago and then again from her scalp on Saturday. The area is really tender. She has lymphnodes that area swollen in the front and side of her neck. She does have a sore throat and a mild cough. She has had a fever since Sunday. Temp 101.7 and had chills last night. Does the patient have any new or worsening symptoms? ---Yes Will a triage be completed? ---Yes Related visit to physician within the last 2 weeks? ---No Does the PT have any chronic conditions? (i.e. diabetes, asthma, this includes High risk factors for pregnancy, etc.) ---Yes List chronic conditions. ---Heart inflammation, Is this a behavioral health or substance abuse call? ---No Guidelines Guideline Title Affirmed Question Affirmed Notes Nurse Date/Time (Eastern Time) Tick Bite [1] 2 to 14 days following tick bite AND [2]  severe headache with fever occurs Ronnald Ramp, RN, Miranda 03/24/2022 9:04:04 AM PLEASE NOTE: All timestamps contained within this report are represented as [place] Standard Time. CONFIDENTIALTY NOTICE: This fax transmission is intended only for the addressee. It contains information that is legally privileged, confidential or otherwise protected from use or disclosure. If you are not the intended recipient, you are strictly prohibited from reviewing, disclosing, copying using or disseminating any of this information or taking any action in reliance on or regarding this information. If you have received this fax in error, please notify us immediately by telephone so that we can arrange for its return to [place]. Phone: [phone], Toll-Free: [phone], Fax: [phone] Page: 2 of 2 Call Id: [id number] [place]. Time Eilene Ghazi Time) Disposition Final User 03/24/2022 8:57:14 AM Attempt made - message left Ronnald Ramp RN, Miranda 03/24/2022 9:07:15 AM Go to ED Now (or PCP triage) Yes Ronnald Ramp, RN, Miranda Final Disposition 03/24/2022 9:07:15 AM Go to ED Now (or PCP triage) Yes Ronnald Ramp, RN, Miranda Caller Disagree/Comply Comply Caller Understands Yes PreDisposition Call Doctor Care Advice Given Per Guideline GO TO ED NOW (OR PCP TRIAGE): * IF NO PCP (PRIMARY CARE PROVIDER) SECOND-LEVEL TRIAGE: You need to be seen within the next hour. Go to the [facility] at _____________ [place] as soon as you can. CARE ADVICE given per Tick Bites (Adult) guideline. Referrals GO TO FACILITY UNDECIDE

## 2022-03-24 NOTE — Discharge Instructions (Addendum)
The Lyme disease and [facility] spotted fever tests are pending.    Take the doxycycline as directed.  Take Tylenol as needed for fever or discomfort.    Follow-up with your primary care provider tomorrow.

## 2022-03-24 NOTE — ED Provider Notes (Signed)
Judith [name]    CSN: [id number] Arrival date & time: 03/24/22  0950      History   Chief Complaint Chief Complaint  Patient presents with   Headache   Chills   Fever    HPI Judith [name] is a 76 y.o. female.  Patient presents with scalp tenderness from tick bite.  She also reports fever, headache, malaise, sore throat, swollen lymph nodes in neck, cough x 2 days.  Her husband removed a tick from her scalp on 03/21/2022.  Her hair dresser removed a tick from behind her right ear one week ago.  She is concerned for tick-related illness.  Tmax 101.7.  Treatment at home with Tylenol yesterday; none taken today.  No rash, chest pain, shortness of breath, vomiting, diarrhea, or other symptoms.  Her medical history includes breast cancer, diverticulosis, IBS, GERD, obstructive sleep apnea, sarcoidosis, allergic rhinitis, hyperlipidemia, DDD.  The history is provided by the patient and medical records.    Past Medical History:  Diagnosis Date   Allergic rhinitis    Allergy    Arthritis    Atrophic vaginitis    Breast cancer ([place])    Right mastectomy   Diverticulosis    GERD (gastroesophageal reflux disease)    H/O: pneumonia    as child-viral pneumonia   Headache    History of shingles    HLD (hyperlipidemia)    pt denies   IBS (irritable bowel syndrome)    OSA (obstructive sleep apnea) 06/20/2020   Osteopenia    Sarcoidosis    Sliding hiatal hernia     Patient Active Problem List   Diagnosis Date Noted   Positive colorectal cancer screening using Cologuard test 02/23/2022   Chronic respiratory failure with hypoxia ([place]) 06/20/2020   OSA (obstructive sleep apnea) 06/20/2020   Unknown status of immunity to COVID-19 virus 03/18/2020   Degeneration of thoracic intervertebral disc 06/17/2018   Encounter for screening mammogram for breast cancer 01/07/2018   Dyspnea 10/25/2017   Sinus tachycardia 10/25/2017   Multiple lung nodules on CT 10/23/2017    Spondylolisthesis, lumbar region 10/14/2017   Routine general medical examination at a health care facility 01/06/2016   IBS (irritable colon syndrome) 05/31/2015   Colon polyps 05/31/2015   Estrogen deficiency 12/05/2014   Encounter for Medicare annual wellness exam 09/24/2013   Personal history of other malignant neoplasm of skin 09/21/2013   Sarcoidosis 08/09/2012   Mixed incontinence urge and stress 03/16/2012   ALOPECIA 11/14/2010   GERD 01/24/2009   NEOPLASM, MALIGNANT, BREAST, HX OF 01/02/2009   Hyperlipidemia 11/17/2007   Osteopenia 11/17/2007   TINNITUS 09/07/2007   ALLERGIC RHINITIS 09/07/2007   DIVERTICULOSIS, COLON 09/07/2007   Fatty liver 09/07/2007   History of diverticulitis 08/12/2007    Past Surgical History:  Procedure Laterality Date   APPENDECTOMY     BASAL CELL CARCINOMA EXCISION     scalp   BASAL CELL CARCINOMA EXCISION  08/2016   nose   COLONOSCOPY  02/2010   divertics, rpt 5 years   DEXA  2005   osteopenia, no change from 2003   [place]  10/1999   LAPAROSCOPIC TOTAL HYSTERECTOMY  10/03   for fibroids/polyp   MASTECTOMY Right 02/2009   right breast cancer   POLYPECTOMY     TOTAL ABDOMINAL HYSTERECTOMY  2003   for fibroids/polyps   VIDEO BRONCHOSCOPY WITH ENDOBRONCHIAL NAVIGATION N/A 02/01/2017   Procedure: VIDEO BRONCHOSCOPY WITH ENDOBRONCHIAL NAVIGATION;  Surgeon: Melrose Nakayama,  MD;  Location: [facility] OR;  Service: Thoracic;  Laterality: N/A;    OB History   No obstetric history on file.      Home Medications    Prior to Admission medications   Medication Sig Start Date End Date Taking? Authorizing Provider  doxycycline (VIBRAMYCIN) 100 MG capsule Take 1 capsule (100 mg total) by mouth 2 (two) times daily for 10 days. 03/24/22 04/03/22 Yes Sharion Balloon, NP  Adalimumab 40 MG/0.4ML PNKT Inject into the skin. 01/15/21   [provider]  calcium-vitamin D (OSCAL WITH D) 500-200 MG-UNIT per tablet Take 1 tablet  by mouth once a week.     [provider]  cyclobenzaprine (FLEXERIL) 10 MG tablet Take 1 tablet (10 mg total) by mouth 3 (three) times daily as needed for muscle spasms (severe back spasm). Do not take with methocarbamol 02/23/22   Tower, Wynelle Fanny, MD  diclofenac sodium (VOLTAREN) 1 % GEL Apply 2 g topically 3 (three) times daily as needed. 01/07/18   Tower, Wynelle Fanny, MD  erythromycin ophthalmic ointment SMARTSIG:In Eye(s) 05/31/20   [provider]  FIBER SELECT GUMMIES PO Take 2 tablets by mouth at bedtime.    [provider]  fish oil-omega-3 fatty acids 1000 MG capsule Take 1 g by mouth once a week.    [provider]  fluticasone (FLONASE) 50 MCG/ACT nasal spray Place 2 sprays into both nostrils daily as needed. 03/20/19   Tower, Wynelle Fanny, MD  ibuprofen (ADVIL,MOTRIN) 200 MG tablet Take 400 mg by mouth every 8 (eight) hours as needed.     [provider]  methocarbamol (ROBAXIN) 500 MG tablet TAKE 1 TABLET BY MOUTH EVERY 8 HOURS AS NEEDED FOR MUSCLE SPASMS. 02/23/22   Tower, Wynelle Fanny, MD  metoprolol succinate (TOPROL-XL) 25 MG 24 hr tablet  09/03/21   [provider]  Multiple Vitamin (MULTIVITAMIN WITH MINERALS) TABS tablet Take 1 tablet by mouth at bedtime.    [provider]  neomycin-polymyxin-dexameth (MAXITROL) 0.1 % OINT Place 1 application into both eyes See admin instructions. PRN: TWICE DAILY X1 WEEK & THEN DECREASE TO ONCE DAILY FOR X2 WEEKS Patient not taking: Reported on 02/23/2022    [provider]  PREDNISONE PO Take 7.25 mg by mouth daily with breakfast. Take 73m by mouth daily    [provider]    Family History Family History  Problem Relation Age of Onset   Prostate cancer Father    Prostate cancer Brother    Lymphoma Brother    Heart attack Paternal Grandmother    Stomach cancer Maternal Grandfather    Colon cancer Neg Hx    Rectal cancer Neg Hx    Esophageal cancer Neg Hx     Social  History Social History   Tobacco Use   Smoking status: Former    Packs/day: 1.00    Years: 3.00    Total pack years: 3.00    Types: Cigarettes    Start date: 89   Quit date: 08/31/1968    Years since quitting: 53.5   Smokeless tobacco: Never  Vaping Use   Vaping Use: Never used  Substance Use Topics   Alcohol use: Not Currently    Alcohol/week: 0.0 standard drinks of alcohol   Drug use: No     Allergies   Niacin and related and Meloxicam   Review of Systems Review of Systems  Constitutional:  Positive for fatigue and fever. Negative for chills.  HENT:  Positive  for sore throat. Negative for ear pain.   Respiratory:  Positive for cough. Negative for shortness of breath.   Cardiovascular:  Negative for chest pain and palpitations.  Gastrointestinal:  Negative for abdominal pain, diarrhea and vomiting.  Skin:  Positive for wound. Negative for color change and rash.  All other systems reviewed and are negative.    Physical Exam Triage Vital Signs ED Triage Vitals  Enc Vitals Group     BP      Pulse      Resp      Temp      Temp src      SpO2      Weight      Height      Head Circumference      Peak Flow      Pain Score      Pain Loc      Pain Edu?      Excl. in [place]?    No data found.  Updated Vital Signs BP 112/75   Pulse 79   Temp (!) 100.9 F (38.3 C)   Resp 20   SpO2 98%   Visual Acuity Right Eye Distance:   Left Eye Distance:   Bilateral Distance:    Right Eye Near:   Left Eye Near:    Bilateral Near:     Physical Exam Vitals and nursing note reviewed.  Constitutional:      General: She is not in acute distress.    Appearance: She is well-developed. She is not ill-appearing.  HENT:     Head:     Comments: No scalp wound, erythema, rash.     Right Ear: Tympanic membrane normal.     Left Ear: Tympanic membrane normal.     Nose: Nose normal.     Mouth/Throat:     Mouth: Mucous membranes are moist.     Pharynx: Oropharynx is clear.   Cardiovascular:     Rate and Rhythm: Normal rate and regular rhythm.     Heart sounds: Normal heart sounds.  Pulmonary:     Effort: Pulmonary effort is normal. No respiratory distress.     Breath sounds: Normal breath sounds.  Musculoskeletal:     Cervical back: Neck supple. No rigidity.  Lymphadenopathy:     Cervical: No cervical adenopathy.  Skin:    General: Skin is warm and dry.     Findings: No rash.  Neurological:     Mental Status: She is alert.  Psychiatric:        Mood and Affect: Mood normal.        Behavior: Behavior normal.      UC Treatments / Results  Labs (all labs ordered are listed, but only abnormal results are displayed) Labs Reviewed  LYME DISEASE SEROLOGY W/REFLEX  ROCKY MTN SPOTTED FVR ABS PNL(IGG+IGM)    EKG   Radiology No results found.  Procedures Procedures (including critical care time)  Medications Ordered in UC Medications - No data to display  Initial Impression / Assessment and Plan / UC Course  I have reviewed the triage vital signs and the nursing notes.  Pertinent labs & imaging results that were available during my care of the patient were reviewed by me and considered in my medical decision making (see chart for details).    Fever, headache, tick bite of scalp.  Patient is concerned for tick related illnesses due to 2 recent tick bites.  She is febrile today at 100.9; no OTC medications  taken today.  Treating with doxycycline.  Lyme and RMSF pending.  Instructed her to take Tylenol as needed for fever or discomfort.  Instructed her to follow up with her PCP.  Education provided on tick bites.  She agree to plan of care.     Final Clinical Impressions(s) / UC Diagnoses   Final diagnoses:  Fever, unspecified  Tick bite of scalp, initial encounter  Acute nonintractable headache, unspecified headache type     Discharge Instructions      The Lyme disease and [facility] spotted fever tests are pending.    Take the  doxycycline as directed.  Take Tylenol as needed for fever or discomfort.    Follow-up with your primary care provider tomorrow.         ED Prescriptions     Medication Sig Dispense Auth. Provider   doxycycline (VIBRAMYCIN) 100 MG capsule Take 1 capsule (100 mg total) by mouth 2 (two) times daily for 10 days. 20 capsule Sharion Balloon, NP      PDMP not reviewed this encounter.   Sharion Balloon, NP 03/24/22 1027

## 2022-03-24 NOTE — ED Triage Notes (Signed)
Pt sent by PCP for evaluation of scalp tenderness, chills, fever x 3 days.

## 2022-03-24 NOTE — Telephone Encounter (Signed)
Per chart review tab pt is presently at [facility]. Sending note to Dr Glori Bickers.

## 2022-03-24 NOTE — Telephone Encounter (Signed)
Patients husband called to try and schedule an appt today but we dont have anything. He said that they removed a tick from her head Saturday and since then she has had swollen lymph nodes, fever of 101.7 and her back has went out. He said shes been in bed the last 2 days. I transferred to the triage nurse for further eval

## 2022-03-24 NOTE — Telephone Encounter (Signed)
Glad she went  Aware, will watch for correspondence

## 2022-03-26 LAB — ROCKY MTN SPOTTED FVR ABS PNL(IGG+IGM)
RMSF IgG: NEGATIVE
RMSF IgM: 0.27 index (ref 0.00–0.89)

## 2022-03-26 LAB — LYME DISEASE SEROLOGY W/REFLEX: Lyme Total Antibody EIA: NEGATIVE

## 2022-03-31 DIAGNOSIS — M5416 Radiculopathy, lumbar region: Secondary | ICD-10-CM | POA: Diagnosis not present

## 2022-03-31 DIAGNOSIS — M48061 Spinal stenosis, lumbar region without neurogenic claudication: Secondary | ICD-10-CM | POA: Diagnosis not present

## 2022-03-31 DIAGNOSIS — M4316 Spondylolisthesis, lumbar region: Secondary | ICD-10-CM | POA: Diagnosis not present

## 2022-04-01 DIAGNOSIS — Z95818 Presence of other cardiac implants and grafts: Secondary | ICD-10-CM | POA: Diagnosis not present

## 2022-04-03 DIAGNOSIS — D869 Sarcoidosis, unspecified: Secondary | ICD-10-CM | POA: Diagnosis not present

## 2022-04-13 DIAGNOSIS — H2511 Age-related nuclear cataract, right eye: Secondary | ICD-10-CM | POA: Diagnosis not present

## 2022-04-14 DIAGNOSIS — G4733 Obstructive sleep apnea (adult) (pediatric): Secondary | ICD-10-CM | POA: Diagnosis not present

## 2022-04-17 DIAGNOSIS — C44311 Basal cell carcinoma of skin of nose: Secondary | ICD-10-CM | POA: Diagnosis not present

## 2022-04-17 HISTORY — PX: MOHS SURGERY: SUR867

## 2022-04-21 ENCOUNTER — Encounter: Payer: Self-pay | Admitting: Ophthalmology

## 2022-04-27 NOTE — Discharge Instructions (Signed)

## 2022-04-29 ENCOUNTER — Ambulatory Visit: Payer: Medicare HMO | Admitting: General Practice

## 2022-04-29 ENCOUNTER — Encounter
Admission: RE | Disposition: A | Discharge status: Home / Self Care | Payer: Self-pay | Source: Ambulatory Visit | Attending: Ophthalmology

## 2022-04-29 ENCOUNTER — Ambulatory Visit (AMBULATORY_SURGERY_CENTER): Payer: Medicare HMO | Admitting: General Practice

## 2022-04-29 ENCOUNTER — Ambulatory Visit
Admission: RE | Admit: 2022-04-29 | Discharge: 2022-04-29 | Disposition: A | Discharge status: Home / Self Care | Payer: Medicare HMO | Source: Ambulatory Visit | Attending: Ophthalmology | Admitting: Ophthalmology

## 2022-04-29 ENCOUNTER — Other Ambulatory Visit: Payer: Self-pay

## 2022-04-29 ENCOUNTER — Encounter: Payer: Self-pay | Admitting: Ophthalmology

## 2022-04-29 DIAGNOSIS — Z853 Personal history of malignant neoplasm of breast: Secondary | ICD-10-CM | POA: Insufficient documentation

## 2022-04-29 DIAGNOSIS — Z9989 Dependence on other enabling machines and devices: Secondary | ICD-10-CM | POA: Diagnosis not present

## 2022-04-29 DIAGNOSIS — K219 Gastro-esophageal reflux disease without esophagitis: Secondary | ICD-10-CM | POA: Diagnosis not present

## 2022-04-29 DIAGNOSIS — D860 Sarcoidosis of lung: Secondary | ICD-10-CM | POA: Insufficient documentation

## 2022-04-29 DIAGNOSIS — H2511 Age-related nuclear cataract, right eye: Secondary | ICD-10-CM | POA: Insufficient documentation

## 2022-04-29 DIAGNOSIS — K449 Diaphragmatic hernia without obstruction or gangrene: Secondary | ICD-10-CM | POA: Insufficient documentation

## 2022-04-29 DIAGNOSIS — M1990 Unspecified osteoarthritis, unspecified site: Secondary | ICD-10-CM | POA: Diagnosis not present

## 2022-04-29 DIAGNOSIS — G4733 Obstructive sleep apnea (adult) (pediatric): Secondary | ICD-10-CM

## 2022-04-29 DIAGNOSIS — Z9981 Dependence on supplemental oxygen: Secondary | ICD-10-CM | POA: Insufficient documentation

## 2022-04-29 DIAGNOSIS — J309 Allergic rhinitis, unspecified: Secondary | ICD-10-CM | POA: Diagnosis not present

## 2022-04-29 DIAGNOSIS — Z87891 Personal history of nicotine dependence: Secondary | ICD-10-CM | POA: Diagnosis not present

## 2022-04-29 HISTORY — DX: Presence of other cardiac implants and grafts: Z95.818

## 2022-04-29 HISTORY — PX: CATARACT EXTRACTION W/PHACO: SHX586

## 2022-04-29 SURGERY — PHACOEMULSIFICATION, CATARACT, WITH IOL INSERTION
Anesthesia: Monitor Anesthesia Care | Site: Eye | Laterality: Right

## 2022-04-29 MED ORDER — SIGHTPATH DOSE#1 BSS IO SOLN
INTRAOCULAR | Status: DC | PRN
  Administered 2022-04-29: 15 mL

## 2022-04-29 MED ORDER — ARMC OPHTHALMIC DILATING DROPS
1.0000 | OPHTHALMIC | Status: DC | PRN
  Administered 2022-04-29 (×3): 1 via OPHTHALMIC

## 2022-04-29 MED ORDER — SIGHTPATH DOSE#1 NA HYALUR & NA CHOND-NA HYALUR IO KIT
PACK | INTRAOCULAR | Status: DC | PRN
  Administered 2022-04-29: 1 via OPHTHALMIC

## 2022-04-29 MED ORDER — LACTATED RINGERS IV SOLN: INTRAVENOUS | Status: DC

## 2022-04-29 MED ORDER — BRIMONIDINE TARTRATE-TIMOLOL 0.2-0.5 % OP SOLN
OPHTHALMIC | Status: DC | PRN
  Administered 2022-04-29: 1 [drp] via OPHTHALMIC

## 2022-04-29 MED ORDER — FENTANYL CITRATE (PF) 100 MCG/2ML IJ SOLN
INTRAMUSCULAR | Status: DC | PRN
Start: 2022-04-29 — End: 2022-04-29
  Administered 2022-04-29: 50 ug via INTRAVENOUS

## 2022-04-29 MED ORDER — MIDAZOLAM HCL 2 MG/2ML IJ SOLN
INTRAMUSCULAR | Status: DC | PRN
  Administered 2022-04-29: 1 mg via INTRAVENOUS

## 2022-04-29 MED ORDER — SIGHTPATH DOSE#1 BSS IO SOLN
INTRAOCULAR | Status: DC | PRN
  Administered 2022-04-29: 64 mL via OPHTHALMIC

## 2022-04-29 MED ORDER — CEFUROXIME OPHTHALMIC INJECTION 1 MG/0.1 ML
INJECTION | OPHTHALMIC | Status: DC | PRN
  Administered 2022-04-29: 0.1 mL via INTRACAMERAL

## 2022-04-29 MED ORDER — TETRACAINE HCL 0.5 % OP SOLN
1.0000 [drp] | OPHTHALMIC | Status: DC | PRN
  Administered 2022-04-29 (×3): 1 [drp] via OPHTHALMIC

## 2022-04-29 MED ORDER — SIGHTPATH DOSE#1 BSS IO SOLN
INTRAOCULAR | Status: DC | PRN
  Administered 2022-04-29: 1 mL via INTRAMUSCULAR

## 2022-04-29 SURGICAL SUPPLY — 13 items
CATARACT SUITE SIGHTPATH (MISCELLANEOUS) ×1 IMPLANT
FEE CATARACT SUITE SIGHTPATH (MISCELLANEOUS) ×1 IMPLANT
GLOVE SRG 8 PF TXTR STRL LF DI (GLOVE) ×1 IMPLANT
GLOVE SURG ENC TEXT LTX SZ7.5 (GLOVE) ×1 IMPLANT
GLOVE SURG UNDER POLY LF SZ8 (GLOVE) ×1
LENS IOL ACRSF IQ VT 15 18.0 IMPLANT
LENS IOL ACRYSOF VIVITY 18.0 ×1 IMPLANT
LENS IOL VIVITY 015 18.0 ×1 IMPLANT
NDL FILTER BLUNT 18X1 1/2 (NEEDLE) ×1 IMPLANT
NEEDLE FILTER BLUNT 18X 1/2SAF (NEEDLE) ×1
NEEDLE FILTER BLUNT 18X1 1/2 (NEEDLE) ×1 IMPLANT
SYR 3ML LL SCALE MARK (SYRINGE) ×1 IMPLANT
WATER STERILE IRR 250ML POUR (IV SOLUTION) ×1 IMPLANT

## 2022-04-29 NOTE — H&P (Signed)
[facility]   Primary Care Physician:  Tower, Wynelle Fanny, MD Ophthalmologist: Dr. Leandrew Koyanagi  Pre-Procedure History & Physical: HPI:  Judith [name] is a 76 y.o. female here for ophthalmic surgery.   Past Medical History:  Diagnosis Date   Allergic rhinitis    Allergy    Arthritis    Atrophic vaginitis    Breast cancer ([place])    Right mastectomy   Diverticulosis    GERD (gastroesophageal reflux disease)    H/O: pneumonia    as child-viral pneumonia   Headache    History of shingles    HLD (hyperlipidemia)    pt denies   IBS (irritable bowel syndrome)    Implantable loop recorder present    OSA (obstructive sleep apnea) 06/20/2020   CPAP   Osteopenia    Sarcoidosis    Sliding hiatal hernia     Past Surgical History:  Procedure Laterality Date   APPENDECTOMY     BASAL CELL CARCINOMA EXCISION     scalp   BASAL CELL CARCINOMA EXCISION  08/2016   nose   COLONOSCOPY  02/2010   divertics, rpt 5 years   DEXA  2005   osteopenia, no change from 2003   [place]  10/1999   LAPAROSCOPIC TOTAL HYSTERECTOMY  05/2002   for fibroids/polyp   MASTECTOMY Right 02/2009   right breast cancer   MOHS SURGERY  04/17/2022   Nose   POLYPECTOMY     TOTAL ABDOMINAL HYSTERECTOMY  2003   for fibroids/polyps   VIDEO BRONCHOSCOPY WITH ENDOBRONCHIAL NAVIGATION N/A 02/01/2017   Procedure: VIDEO BRONCHOSCOPY WITH ENDOBRONCHIAL NAVIGATION;  Surgeon: Melrose Nakayama, MD;  Location: [facility] OR;  Service: Thoracic;  Laterality: N/A;    Prior to Admission medications   Medication Sig Start Date End Date Taking? Authorizing Provider  acetaminophen (TYLENOL) 500 MG tablet Take 500 mg by mouth every 6 (six) hours as needed.   Yes [provider]  Adalimumab 40 MG/0.4ML PNKT Inject into the skin. 01/15/21  Yes [provider]  Biotin 5 MG TABS Take by mouth daily.   Yes [provider]  calcium-vitamin D (OSCAL WITH D) 500-200  MG-UNIT per tablet Take 1 tablet by mouth once a week.    Yes [provider]  cholecalciferol (VITAMIN D3) 25 MCG (1000 UNIT) tablet Take 1,000 Units by mouth daily.   Yes [provider]  cyclobenzaprine (FLEXERIL) 10 MG tablet Take 1 tablet (10 mg total) by mouth 3 (three) times daily as needed for muscle spasms (severe back spasm). Do not take with methocarbamol 02/23/22  Yes Tower, Wynelle Fanny, MD  famotidine (PEPCID) 10 MG tablet Take 10 mg by mouth 2 (two) times daily as needed for heartburn or indigestion.   Yes [provider]  FIBER SELECT GUMMIES PO Take 2 tablets by mouth at bedtime.   Yes [provider]  fluticasone (FLONASE) 50 MCG/ACT nasal spray Place 2 sprays into both nostrils daily as needed. 03/20/19  Yes Tower, Wynelle Fanny, MD  Lifitegrast Shirley Friar) 5 % SOLN Apply to eye.   Yes [provider]  methocarbamol (ROBAXIN) 500 MG tablet TAKE 1 TABLET BY MOUTH EVERY 8 HOURS AS NEEDED FOR MUSCLE SPASMS. 02/23/22  Yes Tower, Wynelle Fanny, MD  metoprolol succinate (TOPROL-XL) 25 MG 24 hr tablet  09/03/21  Yes [provider]  Multiple Vitamin (MULTIVITAMIN WITH MINERALS) TABS tablet Take 1 tablet by mouth at bedtime.   Yes [provider]  OXYGEN Inhale into the lungs at bedtime. 2.5 - 3.0 L   Yes [provider]  Polyethyl Glycol-Propyl Glycol (SYSTANE) 0.4-0.3 % SOLN Apply to eye.   Yes [provider]  PREDNISONE PO Take 5 mg by mouth daily with breakfast. Take 73m by mouth daily   Yes [provider]  vitamin B-12 (CYANOCOBALAMIN) 500 MCG tablet Take 500 mcg by mouth daily.   Yes [provider]  diclofenac sodium (VOLTAREN) 1 % GEL Apply 2 g topically 3 (three) times daily as needed. Patient not taking: Reported on 04/21/2022 01/07/18   TAbner Greenspan MD  erythromycin ophthalmic ointment SMARTSIG:In Eye(s) 05/31/20   [provider]  fish oil-omega-3 fatty acids 1000 MG capsule Take 1 g by mouth  once a week.    [provider]  ibuprofen (ADVIL,MOTRIN) 200 MG tablet Take 400 mg by mouth every 8 (eight) hours as needed.     [provider]  neomycin-polymyxin-dexameth (MAXITROL) 0.1 % OINT Place 1 application into both eyes See admin instructions. PRN: TWICE DAILY X1 WEEK & THEN DECREASE TO ONCE DAILY FOR X2 WEEKS Patient not taking: Reported on 02/23/2022    [provider]    Allergies as of 02/24/2022 - Review Complete 02/23/2022  Allergen Reaction Noted   Niacin and related Other (See Comments) 02/16/2011   Meloxicam Rash 08/12/2007    Family History  Problem Relation Age of Onset   Prostate cancer Father    Prostate cancer Brother    Lymphoma Brother    Heart attack Paternal Grandmother    Stomach cancer Maternal Grandfather    Colon cancer Neg Hx    Rectal cancer Neg Hx    Esophageal cancer Neg Hx     Social History   Socioeconomic History   Marital status: Married    Spouse name: Not on file   Number of children: 2   Years of education: Not on file   Highest education level: Not on file  Occupational History   Occupation: Retired  Tobacco Use   Smoking status: Former    Packs/day: 1.00    Years: 3.00    Total pack years: 3.00    Types: Cigarettes    Start date: 114   Quit date: 08/31/1968    Years since quitting: 53.6   Smokeless tobacco: Never  Vaping Use   Vaping Use: Never used  Substance and Sexual Activity   Alcohol use: Not Currently    Alcohol/week: 0.0 standard drinks of alcohol   Drug use: No   Sexual activity: Yes  Other Topics Concern   Not on file  Social History Narrative   Married      2 sons      Retired      2 cups coffee/daily      Gym 3-4 times/week   Social Determinants of Health   Financial Resource Strain: Low Risk  (02/20/2022)   Overall Financial Resource Strain (CARDIA)    Difficulty of Paying Living Expenses: Not hard at all  Food Insecurity: No Food Insecurity (02/20/2022)   Hunger  Vital Sign    Worried About Running Out of Food in the Last Year: Never true    [place] the Last Year: Never true  Transportation Needs: No Transportation Needs (02/20/2022)   PRAPARE - THydrologist(Medical): No    Lack of Transportation (Non-Medical): No  Physical Activity: Insufficiently Active (02/20/2022)   Exercise Vital Sign  Days of Exercise per Week: 7 days    Minutes of Exercise per Session: 20 min  Stress: No Stress Concern Present (02/20/2022)   [place]    Feeling of Stress : Not at all  Social Connections: Not on file  Intimate Partner Violence: Not At Risk (01/19/2020)   Humiliation, Afraid, Rape, and Kick questionnaire    Fear of Current or Ex-Partner: No    Emotionally Abused: No    Physically Abused: No    Sexually Abused: No    Review of Systems: See HPI, otherwise negative ROS  Physical Exam: BP 130/76   Pulse (!) 57   Temp 98.1 F (36.7 C) (Tympanic)   Ht 5' 0.98" (1.549 m)   Wt 72.4 kg   SpO2 96%   BMI 30.17 kg/m  General:   Alert,  pleasant and cooperative in NAD Head:  Normocephalic and atraumatic. Lungs:  Clear to auscultation.    Heart:  Regular rate and rhythm.   Impression/Plan: Barbara Cower is here for ophthalmic surgery.  Risks, benefits, limitations, and alternatives regarding ophthalmic surgery have been reviewed with the patient.  Questions have been answered.  All parties agreeable.   Leandrew Koyanagi, MD  04/29/2022, 11:25 AM

## 2022-04-29 NOTE — Op Note (Signed)
  LOCATION:  [place]   PREOPERATIVE DIAGNOSIS:    Nuclear sclerotic cataract right eye. H25.11   POSTOPERATIVE DIAGNOSIS:  Nuclear sclerotic cataract right eye.     PROCEDURE:  Phacoemusification with posterior chamber intraocular lens placement of the right eye   ULTRASOUND TIME: Procedure(s) with comments: CATARACT EXTRACTION PHACO AND INTRAOCULAR LENS PLACEMENT (IOC) RIGHT VIVITY LENS 10.90 01:10.5 (Right) - sleep apnea  LENS:   Implant Name Type Inv. Item Serial No. Manufacturer Lot No. LRB No. Used Action  LENS IOL ACRYSOF VIVITY 18.0 - [id number]  LENS IOL ACRYSOF VIVITY 18.0 [id number] [employer]  Right 1 Implanted         SURGEON:  Wyonia Hough, MD   ANESTHESIA:  Topical with tetracaine drops and 2% Xylocaine jelly, augmented with 1% preservative-free intracameral lidocaine.    COMPLICATIONS:  None.   DESCRIPTION OF PROCEDURE:  The patient was identified in the holding room and transported to the operating room and placed in the supine position under the operating microscope.  The right eye was identified as the operative eye and it was prepped and draped in the usual sterile ophthalmic fashion.   A 1 millimeter clear-corneal paracentesis was made at the 12:00 position.  0.5 ml of preservative-free 1% lidocaine was injected into the anterior chamber. The anterior chamber was filled with Viscoat viscoelastic.  A 2.4 millimeter keratome was used to make a near-clear corneal incision at the 9:00 position.  A curvilinear capsulorrhexis was made with a cystotome and capsulorrhexis forceps.  Balanced salt solution was used to hydrodissect and hydrodelineate the nucleus.   Phacoemulsification was then used in stop and chop fashion to remove the lens nucleus and epinucleus.  The remaining cortex was then removed using the irrigation and aspiration handpiece. Provisc was then placed into the capsular bag to distend it for lens placement.  A lens was then injected  into the capsular bag.  The remaining viscoelastic was aspirated.   Wounds were hydrated with balanced salt solution.  The anterior chamber was inflated to a physiologic pressure with balanced salt solution.  No wound leaks were noted. Cefuroxime 0.1 ml of a 53m/ml solution was injected into the anterior chamber for a dose of 1 mg of intracameral antibiotic at the completion of the case.   Timolol and Brimonidine drops were applied to the eye.  The patient was taken to the recovery room in stable condition without complications of anesthesia or surgery.   Rayona Sardinha 04/29/2022, 12:13 PM

## 2022-04-29 NOTE — Anesthesia Postprocedure Evaluation (Signed)
Anesthesia Post Note  Patient: Judith [name]  Procedure(s) Performed: CATARACT EXTRACTION PHACO AND INTRAOCULAR LENS PLACEMENT (IOC) RIGHT VIVITY LENS 10.90 01:10.5 (Right: Eye)     Patient location during evaluation: PACU Anesthesia Type: MAC Level of consciousness: awake and alert Pain management: pain level controlled Vital Signs Assessment: post-procedure vital signs reviewed and stable Respiratory status: spontaneous breathing, nonlabored ventilation, respiratory function stable and patient connected to nasal cannula oxygen Cardiovascular status: stable and blood pressure returned to baseline Postop Assessment: no apparent nausea or vomiting Anesthetic complications: no   There were no known notable events for this encounter.  Martha Clan

## 2022-04-29 NOTE — Transfer of Care (Signed)
Immediate Anesthesia Transfer of Care Note  Patient: Judith [name]  Procedure(s) Performed: CATARACT EXTRACTION PHACO AND INTRAOCULAR LENS PLACEMENT (IOC) RIGHT VIVITY LENS 10.90 01:10.5 (Right: Eye)  Patient Location: PACU  Anesthesia Type: MAC  Level of Consciousness: awake, alert  and patient cooperative  Airway and Oxygen Therapy: Patient Spontanous Breathing and Patient connected to supplemental oxygen  Post-op Assessment: Post-op Vital signs reviewed, Patient's Cardiovascular Status Stable, Respiratory Function Stable, Patent Airway and No signs of Nausea or vomiting  Post-op Vital Signs: Reviewed and stable  Complications: There were no known notable events for this encounter.

## 2022-04-29 NOTE — Anesthesia Preprocedure Evaluation (Addendum)
Anesthesia Evaluation  Patient identified by MRN, date of birth, ID band Patient awake    Reviewed: Allergy & Precautions, H&P , NPO status , Patient's Chart, lab work & pertinent test results, reviewed documented beta blocker date and time   History of Anesthesia Complications Negative for: history of anesthetic complications  Airway Mallampati: II  TM Distance: >3 FB Neck ROM: full    Dental  (+) Caps, Dental Advidsory Given, Teeth Intact   Pulmonary shortness of breath and with exertion, sleep apnea, Continuous Positive Airway Pressure Ventilation and Oxygen sleep apnea , neg COPD, neg recent URI, former smoker,  Sarcoid, lung fibrosis   Pulmonary exam normal breath sounds clear to auscultation       Cardiovascular Exercise Tolerance: Good (-) hypertension(-) angina(-) Past MI and (-) Cardiac Stents Normal cardiovascular exam+ dysrhythmias (-) Valvular Problems/Murmurs Rhythm:regular Rate:Normal  Loop recorder in place   Neuro/Psych neg Seizures negative neurological ROS  negative psych ROS   GI/Hepatic Neg liver ROS, hiatal hernia, GERD  ,  Endo/Other  negative endocrine ROS  Renal/GU negative Renal ROS  negative genitourinary   Musculoskeletal   Abdominal   Peds  Hematology negative hematology ROS (+)   Anesthesia Other Findings Past Medical History: No date: Allergic rhinitis No date: Allergy No date: Arthritis No date: Atrophic vaginitis No date: Breast cancer ([place])     Comment:  Right mastectomy No date: Diverticulosis No date: GERD (gastroesophageal reflux disease) No date: H/O: pneumonia     Comment:  as child-viral pneumonia No date: Headache No date: History of shingles No date: HLD (hyperlipidemia)     Comment:  pt denies No date: IBS (irritable bowel syndrome) No date: Implantable loop recorder present 06/20/2020: OSA (obstructive sleep apnea)     Comment:  CPAP No date: Osteopenia No  date: Sarcoidosis No date: Sliding hiatal hernia   Reproductive/Obstetrics negative OB ROS                            Anesthesia Physical Anesthesia Plan  ASA: 2  Anesthesia Plan: MAC   Post-op Pain Management:    Induction:   PONV Risk Score and Plan: 2 and Midazolam  Airway Management Planned: Natural Airway and Nasal Cannula  Additional Equipment:   Intra-op Plan:   Post-operative Plan:   Informed Consent: I have reviewed the patients History and Physical, chart, labs and discussed the procedure including the risks, benefits and alternatives for the proposed anesthesia with the patient or authorized representative who has indicated his/her understanding and acceptance.     Dental Advisory Given  Plan Discussed with: Anesthesiologist, CRNA and Surgeon  Anesthesia Plan Comments:         Anesthesia Quick Evaluation

## 2022-04-30 ENCOUNTER — Encounter: Payer: Self-pay | Admitting: Ophthalmology

## 2022-05-04 DIAGNOSIS — D869 Sarcoidosis, unspecified: Secondary | ICD-10-CM | POA: Diagnosis not present

## 2022-05-11 ENCOUNTER — Ambulatory Visit: Payer: Medicare HMO | Attending: Pulmonary Disease

## 2022-05-11 DIAGNOSIS — J9611 Chronic respiratory failure with hypoxia: Secondary | ICD-10-CM | POA: Diagnosis not present

## 2022-05-11 DIAGNOSIS — G4733 Obstructive sleep apnea (adult) (pediatric): Secondary | ICD-10-CM | POA: Diagnosis not present

## 2022-05-11 DIAGNOSIS — Z6830 Body mass index (BMI) 30.0-30.9, adult: Secondary | ICD-10-CM | POA: Diagnosis not present

## 2022-05-11 NOTE — Discharge Instructions (Signed)

## 2022-05-12 ENCOUNTER — Telehealth (HOSPITAL_BASED_OUTPATIENT_CLINIC_OR_DEPARTMENT_OTHER): Payer: Medicare HMO | Admitting: Pulmonary Disease

## 2022-05-12 DIAGNOSIS — G4733 Obstructive sleep apnea (adult) (pediatric): Secondary | ICD-10-CM

## 2022-05-12 NOTE — Telephone Encounter (Signed)
CPAP 10 cm No oxygen required

## 2022-05-13 ENCOUNTER — Other Ambulatory Visit: Payer: Self-pay

## 2022-05-13 ENCOUNTER — Ambulatory Visit: Payer: Medicare HMO | Admitting: Anesthesiology

## 2022-05-13 ENCOUNTER — Ambulatory Visit (AMBULATORY_SURGERY_CENTER): Payer: Medicare HMO | Admitting: Anesthesiology

## 2022-05-13 ENCOUNTER — Encounter
Admission: RE | Disposition: A | Discharge status: Home / Self Care | Payer: Self-pay | Source: Home / Self Care | Attending: Ophthalmology

## 2022-05-13 ENCOUNTER — Ambulatory Visit
Admission: RE | Admit: 2022-05-13 | Discharge: 2022-05-13 | Disposition: A | Discharge status: Home / Self Care | Payer: Medicare HMO | Attending: Ophthalmology | Admitting: Ophthalmology

## 2022-05-13 ENCOUNTER — Encounter: Payer: Self-pay | Admitting: Ophthalmology

## 2022-05-13 DIAGNOSIS — Z8619 Personal history of other infectious and parasitic diseases: Secondary | ICD-10-CM | POA: Diagnosis not present

## 2022-05-13 DIAGNOSIS — H2512 Age-related nuclear cataract, left eye: Secondary | ICD-10-CM

## 2022-05-13 DIAGNOSIS — G4730 Sleep apnea, unspecified: Secondary | ICD-10-CM | POA: Diagnosis not present

## 2022-05-13 DIAGNOSIS — Z9981 Dependence on supplemental oxygen: Secondary | ICD-10-CM | POA: Insufficient documentation

## 2022-05-13 DIAGNOSIS — Z9989 Dependence on other enabling machines and devices: Secondary | ICD-10-CM

## 2022-05-13 DIAGNOSIS — K219 Gastro-esophageal reflux disease without esophagitis: Secondary | ICD-10-CM | POA: Insufficient documentation

## 2022-05-13 DIAGNOSIS — G4733 Obstructive sleep apnea (adult) (pediatric): Secondary | ICD-10-CM

## 2022-05-13 DIAGNOSIS — J449 Chronic obstructive pulmonary disease, unspecified: Secondary | ICD-10-CM | POA: Insufficient documentation

## 2022-05-13 DIAGNOSIS — Z87891 Personal history of nicotine dependence: Secondary | ICD-10-CM

## 2022-05-13 DIAGNOSIS — Z853 Personal history of malignant neoplasm of breast: Secondary | ICD-10-CM | POA: Insufficient documentation

## 2022-05-13 DIAGNOSIS — E785 Hyperlipidemia, unspecified: Secondary | ICD-10-CM | POA: Diagnosis not present

## 2022-05-13 DIAGNOSIS — Z9011 Acquired absence of right breast and nipple: Secondary | ICD-10-CM | POA: Insufficient documentation

## 2022-05-13 HISTORY — PX: CATARACT EXTRACTION W/PHACO: SHX586

## 2022-05-13 SURGERY — PHACOEMULSIFICATION, CATARACT, WITH IOL INSERTION
Anesthesia: Monitor Anesthesia Care | Site: Eye | Laterality: Left

## 2022-05-13 MED ORDER — SIGHTPATH DOSE#1 BSS IO SOLN
INTRAOCULAR | Status: DC | PRN
  Administered 2022-05-13: 15 mL

## 2022-05-13 MED ORDER — ARMC OPHTHALMIC DILATING DROPS
1.0000 | OPHTHALMIC | Status: DC | PRN
  Administered 2022-05-13 (×3): 1 via OPHTHALMIC

## 2022-05-13 MED ORDER — FENTANYL CITRATE (PF) 100 MCG/2ML IJ SOLN
INTRAMUSCULAR | Status: DC | PRN
  Administered 2022-05-13: 50 ug via INTRAVENOUS

## 2022-05-13 MED ORDER — SIGHTPATH DOSE#1 BSS IO SOLN
INTRAOCULAR | Status: DC | PRN
  Administered 2022-05-13: 1 mL via INTRAMUSCULAR

## 2022-05-13 MED ORDER — SIGHTPATH DOSE#1 NA HYALUR & NA CHOND-NA HYALUR IO KIT
PACK | INTRAOCULAR | Status: DC | PRN
  Administered 2022-05-13: 1 via OPHTHALMIC

## 2022-05-13 MED ORDER — CEFUROXIME OPHTHALMIC INJECTION 1 MG/0.1 ML
INJECTION | OPHTHALMIC | Status: DC | PRN
  Administered 2022-05-13: 0.1 mL via INTRACAMERAL

## 2022-05-13 MED ORDER — SIGHTPATH DOSE#1 BSS IO SOLN
INTRAOCULAR | Status: DC | PRN
  Administered 2022-05-13: 78 mL via OPHTHALMIC

## 2022-05-13 MED ORDER — BRIMONIDINE TARTRATE-TIMOLOL 0.2-0.5 % OP SOLN
OPHTHALMIC | Status: DC | PRN
  Administered 2022-05-13: 1 [drp] via OPHTHALMIC

## 2022-05-13 MED ORDER — MIDAZOLAM HCL 2 MG/2ML IJ SOLN
INTRAMUSCULAR | Status: DC | PRN
  Administered 2022-05-13: 1 mg via INTRAVENOUS

## 2022-05-13 MED ORDER — TETRACAINE HCL 0.5 % OP SOLN
1.0000 [drp] | OPHTHALMIC | Status: DC | PRN
  Administered 2022-05-13 (×3): 1 [drp] via OPHTHALMIC

## 2022-05-13 SURGICAL SUPPLY — 12 items
CATARACT SUITE SIGHTPATH (MISCELLANEOUS) ×1 IMPLANT
FEE CATARACT SUITE SIGHTPATH (MISCELLANEOUS) ×1 IMPLANT
GLOVE SRG 8 PF TXTR STRL LF DI (GLOVE) ×1 IMPLANT
GLOVE SURG ENC TEXT LTX SZ7.5 (GLOVE) ×1 IMPLANT
GLOVE SURG UNDER POLY LF SZ8 (GLOVE) ×1
LENS IOL ACRSF IQ VT 15 19.5 IMPLANT
LENS IOL ACRYSOF VIVITY 19.5 ×1 IMPLANT
LENS IOL VIVITY 015 19.5 ×1 IMPLANT
NDL FILTER BLUNT 18X1 1/2 (NEEDLE) ×1 IMPLANT
NEEDLE FILTER BLUNT 18X1 1/2 (NEEDLE) ×1 IMPLANT
SYR 3ML LL SCALE MARK (SYRINGE) ×1 IMPLANT
WATER STERILE IRR 250ML POUR (IV SOLUTION) ×1 IMPLANT

## 2022-05-13 NOTE — Op Note (Signed)
OPERATIVE NOTE  Judith [name] [id number] 05/13/2022   PREOPERATIVE DIAGNOSIS:  Nuclear sclerotic cataract left eye. H25.12   POSTOPERATIVE DIAGNOSIS:    Nuclear sclerotic cataract left eye.     PROCEDURE:  Phacoemusification with posterior chamber intraocular lens placement of the left eye  Ultrasound time: Procedure(s) with comments: CATARACT EXTRACTION PHACO AND INTRAOCULAR LENS PLACEMENT (IOC) LEFT VIVITY LENS 8.84 01:10.1 (Left) - sleep apnea  LENS:   Implant Name Type Inv. Item Serial No. Manufacturer Lot No. LRB No. Used Action  LENS IOL ACRYSOF VIVITY 19.5 - [id number]  LENS IOL ACRYSOF VIVITY 19.5 [id number] [employer]  Left 1 Implanted      SURGEON:  Wyonia Hough, MD   ANESTHESIA:  Topical with tetracaine drops and 2% Xylocaine jelly, augmented with 1% preservative-free intracameral lidocaine.    COMPLICATIONS:  None.   DESCRIPTION OF PROCEDURE:  The patient was identified in the holding room and transported to the operating room and placed in the supine position under the operating microscope.  The left eye was identified as the operative eye and it was prepped and draped in the usual sterile ophthalmic fashion.   A 1 millimeter clear-corneal paracentesis was made at the 1:30 position.  0.5 ml of preservative-free 1% lidocaine was injected into the anterior chamber.  The anterior chamber was filled with Viscoat viscoelastic.  A 2.4 millimeter keratome was used to make a near-clear corneal incision at the 10:30 position.  .  A curvilinear capsulorrhexis was made with a cystotome and capsulorrhexis forceps.  Balanced salt solution was used to hydrodissect and hydrodelineate the nucleus.   Phacoemulsification was then used in stop and chop fashion to remove the lens nucleus and epinucleus.  The remaining cortex was then removed using the irrigation and aspiration handpiece. Provisc was then placed into the capsular bag to distend it for lens placement.  A lens  was then injected into the capsular bag.  The remaining viscoelastic was aspirated.   Wounds were hydrated with balanced salt solution.  The anterior chamber was inflated to a physiologic pressure with balanced salt solution.  No wound leaks were noted. Cefuroxime 0.1 ml of a 70m/ml solution was injected into the anterior chamber for a dose of 1 mg of intracameral antibiotic at the completion of the case.   Timolol and Brimonidine drops were applied to the eye.  The patient was taken to the recovery room in stable condition without complications of anesthesia or surgery.  Judith [name] 05/13/2022, 8:20 AM

## 2022-05-13 NOTE — Anesthesia Postprocedure Evaluation (Signed)
Anesthesia Post Note  Patient: Judith [name]  Procedure(s) Performed: CATARACT EXTRACTION PHACO AND INTRAOCULAR LENS PLACEMENT (IOC) LEFT VIVITY LENS 8.84 01:10.1 (Left: Eye)     Patient location during evaluation: PACU Anesthesia Type: MAC Level of consciousness: awake and alert Pain management: pain level controlled Vital Signs Assessment: post-procedure vital signs reviewed and stable Respiratory status: spontaneous breathing, nonlabored ventilation, respiratory function stable and patient connected to nasal cannula oxygen Cardiovascular status: stable and blood pressure returned to baseline Postop Assessment: no apparent nausea or vomiting Anesthetic complications: no   There were no known notable events for this encounter.  Arita Miss

## 2022-05-13 NOTE — Anesthesia Preprocedure Evaluation (Signed)
Anesthesia Evaluation  Patient identified by MRN, date of birth, ID band Patient awake    Reviewed: Allergy & Precautions, H&P , NPO status , Patient's Chart, lab work & pertinent test results, reviewed documented beta blocker date and time   History of Anesthesia Complications Negative for: history of anesthetic complications  Airway Mallampati: II  TM Distance: >3 FB Neck ROM: full    Dental  (+) Caps, Dental Advidsory Given, Teeth Intact   Pulmonary shortness of breath and with exertion, sleep apnea, Continuous Positive Airway Pressure Ventilation and Oxygen sleep apnea , neg COPD, neg recent URI, former smoker,  Sarcoid, lung fibrosis   Pulmonary exam normal breath sounds clear to auscultation       Cardiovascular Exercise Tolerance: Good (-) hypertension(-) angina(-) Past MI and (-) Cardiac Stents Normal cardiovascular exam+ dysrhythmias (-) Valvular Problems/Murmurs Rhythm:regular Rate:Normal  Loop recorder in place   Neuro/Psych neg Seizures negative neurological ROS  negative psych ROS   GI/Hepatic Neg liver ROS, hiatal hernia, GERD  Controlled,  Endo/Other  negative endocrine ROS  Renal/GU negative Renal ROS  negative genitourinary   Musculoskeletal   Abdominal   Peds  Hematology negative hematology ROS (+)   Anesthesia Other Findings Past Medical History: No date: Allergic rhinitis No date: Allergy No date: Arthritis No date: Atrophic vaginitis No date: Breast cancer ([place])     Comment:  Right mastectomy No date: Diverticulosis No date: GERD (gastroesophageal reflux disease) No date: H/O: pneumonia     Comment:  as child-viral pneumonia No date: Headache No date: History of shingles No date: HLD (hyperlipidemia)     Comment:  pt denies No date: IBS (irritable bowel syndrome) No date: Implantable loop recorder present 06/20/2020: OSA (obstructive sleep apnea)     Comment:  CPAP No date:  Osteopenia No date: Sarcoidosis No date: Sliding hiatal hernia   Reproductive/Obstetrics negative OB ROS                             Anesthesia Physical  Anesthesia Plan  ASA: 2  Anesthesia Plan: MAC   Post-op Pain Management:    Induction: Intravenous  PONV Risk Score and Plan: 2 and Midazolam  Airway Management Planned: Natural Airway and Nasal Cannula  Additional Equipment:   Intra-op Plan:   Post-operative Plan:   Informed Consent: I have reviewed the patients History and Physical, chart, labs and discussed the procedure including the risks, benefits and alternatives for the proposed anesthesia with the patient or authorized representative who has indicated his/her understanding and acceptance.     Dental Advisory Given  Plan Discussed with: Anesthesiologist, CRNA and Surgeon  Anesthesia Plan Comments: (Explained risks of anesthesia, including PONV, and rare emergencies such as cardiac events, respiratory problems, and allergic reactions, requiring invasive intervention. Discussed the role of CRNA in patient's perioperative care. Patient understands. )        Anesthesia Quick Evaluation

## 2022-05-13 NOTE — Transfer of Care (Signed)
Immediate Anesthesia Transfer of Care Note  Patient: Judith [name]  Procedure(s) Performed: CATARACT EXTRACTION PHACO AND INTRAOCULAR LENS PLACEMENT (IOC) LEFT VIVITY LENS 8.84 01:10.1 (Left: Eye)  Patient Location: PACU  Anesthesia Type: MAC  Level of Consciousness: awake, alert  and patient cooperative  Airway and Oxygen Therapy: Patient Spontanous Breathing and Patient connected to supplemental oxygen  Post-op Assessment: Post-op Vital signs reviewed, Patient's Cardiovascular Status Stable, Respiratory Function Stable, Patent Airway and No signs of Nausea or vomiting  Post-op Vital Signs: Reviewed and stable  Complications: There were no known notable events for this encounter.

## 2022-05-13 NOTE — H&P (Signed)
[facility]   Primary Care Physician:  Tower, Wynelle Fanny, MD Ophthalmologist: Dr. Leandrew Koyanagi  Pre-Procedure History & Physical: HPI:  Judith [name] is a 76 y.o. female here for ophthalmic surgery.   Past Medical History:  Diagnosis Date   Allergic rhinitis    Allergy    Arthritis    Atrophic vaginitis    Breast cancer ([place])    Right mastectomy   Diverticulosis    GERD (gastroesophageal reflux disease)    H/O: pneumonia    as child-viral pneumonia   Headache    History of shingles    HLD (hyperlipidemia)    pt denies   IBS (irritable bowel syndrome)    Implantable loop recorder present    OSA (obstructive sleep apnea) 06/20/2020   CPAP   Osteopenia    Sarcoidosis    Sliding hiatal hernia     Past Surgical History:  Procedure Laterality Date   APPENDECTOMY     BASAL CELL CARCINOMA EXCISION     scalp   BASAL CELL CARCINOMA EXCISION  08/2016   nose   CATARACT EXTRACTION W/PHACO Right 04/29/2022   Procedure: CATARACT EXTRACTION PHACO AND INTRAOCULAR LENS PLACEMENT (IOC) RIGHT VIVITY LENS 10.90 01:10.5;  Surgeon: Leandrew Koyanagi, MD;  Location: [place];  Service: Ophthalmology;  Laterality: Right;  sleep apnea   COLONOSCOPY  02/2010   divertics, rpt 5 years   DEXA  2005   osteopenia, no change from 2003   DILATION AND CURETTAGE OF UTERUS  10/1999   LAPAROSCOPIC TOTAL HYSTERECTOMY  05/2002   for fibroids/polyp   MASTECTOMY Right 02/2009   right breast cancer   MOHS SURGERY  04/17/2022   Nose   POLYPECTOMY     TOTAL ABDOMINAL HYSTERECTOMY  2003   for fibroids/polyps   VIDEO BRONCHOSCOPY WITH ENDOBRONCHIAL NAVIGATION N/A 02/01/2017   Procedure: VIDEO BRONCHOSCOPY WITH ENDOBRONCHIAL NAVIGATION;  Surgeon: Melrose Nakayama, MD;  Location: [place];  Service: Thoracic;  Laterality: N/A;    Prior to Admission medications   Medication Sig Start Date End Date Taking? Authorizing Provider  acetaminophen (TYLENOL) 500 MG tablet Take  500 mg by mouth every 6 (six) hours as needed.   Yes [provider]  Adalimumab 40 MG/0.4ML PNKT Inject into the skin. 01/15/21  Yes [provider]  Biotin 5 MG TABS Take by mouth daily.   Yes [provider]  calcium-vitamin D (OSCAL WITH D) 500-200 MG-UNIT per tablet Take 1 tablet by mouth once a week.    Yes [provider]  cholecalciferol (VITAMIN D3) 25 MCG (1000 UNIT) tablet Take 1,000 Units by mouth daily.   Yes [provider]  cyclobenzaprine (FLEXERIL) 10 MG tablet Take 1 tablet (10 mg total) by mouth 3 (three) times daily as needed for muscle spasms (severe back spasm). Do not take with methocarbamol 02/23/22  Yes Tower, Wynelle Fanny, MD  erythromycin ophthalmic ointment SMARTSIG:In Eye(s) 05/31/20  Yes [provider]  famotidine (PEPCID) 10 MG tablet Take 10 mg by mouth 2 (two) times daily as needed for heartburn or indigestion.   Yes [provider]  FIBER SELECT GUMMIES PO Take 2 tablets by mouth at bedtime.   Yes [provider]  fish oil-omega-3 fatty acids 1000 MG capsule Take 1 g by mouth once a week.   Yes [provider]  fluticasone (FLONASE) 50 MCG/ACT nasal spray Place 2 sprays into both nostrils daily as needed. 03/20/19  Yes Tower, Wynelle Fanny, MD  ibuprofen (  ADVIL,MOTRIN) 200 MG tablet Take 400 mg by mouth every 8 (eight) hours as needed.    Yes [provider]  Lifitegrast Shirley Friar) 5 % SOLN Apply to eye.   Yes [provider]  methocarbamol (ROBAXIN) 500 MG tablet TAKE 1 TABLET BY MOUTH EVERY 8 HOURS AS NEEDED FOR MUSCLE SPASMS. 02/23/22  Yes Tower, Wynelle Fanny, MD  metoprolol succinate (TOPROL-XL) 25 MG 24 hr tablet  09/03/21  Yes [provider]  Multiple Vitamin (MULTIVITAMIN WITH MINERALS) TABS tablet Take 1 tablet by mouth at bedtime.   Yes [provider]  OXYGEN Inhale into the lungs at bedtime. 2.5 - 3.0 L   Yes [provider]  Polyethyl Glycol-Propyl  Glycol (SYSTANE) 0.4-0.3 % SOLN Apply to eye.   Yes [provider]  PREDNISONE PO Take 5 mg by mouth daily with breakfast. Take 84m by mouth daily   Yes [provider]  vitamin B-12 (CYANOCOBALAMIN) 500 MCG tablet Take 500 mcg by mouth daily.   Yes [provider]  diclofenac sodium (VOLTAREN) 1 % GEL Apply 2 g topically 3 (three) times daily as needed. Patient not taking: Reported on 04/21/2022 01/07/18   Tower, MWynelle Fanny MD  neomycin-polymyxin-dexameth (MAXITROL) 0.1 % OINT Place 1 application into both eyes See admin instructions. PRN: TWICE DAILY X1 WEEK & THEN DECREASE TO ONCE DAILY FOR X2 WEEKS Patient not taking: Reported on 02/23/2022    [provider]    Allergies as of 02/24/2022 - Review Complete 02/23/2022  Allergen Reaction Noted   Niacin and related Other (See Comments) 02/16/2011   Meloxicam Rash 08/12/2007    Family History  Problem Relation Age of Onset   Prostate cancer Father    Prostate cancer Brother    Lymphoma Brother    Heart attack Paternal Grandmother    Stomach cancer Maternal Grandfather    Colon cancer Neg Hx    Rectal cancer Neg Hx    Esophageal cancer Neg Hx     Social History   Socioeconomic History   Marital status: Married    Spouse name: Not on file   Number of children: 2   Years of education: Not on file   Highest education level: Not on file  Occupational History   Occupation: Retired  Tobacco Use   Smoking status: Former    Packs/day: 1.00    Years: 3.00    Total pack years: 3.00    Types: Cigarettes    Start date: 17   Quit date: 08/31/1968    Years since quitting: 53.7   Smokeless tobacco: Never  Vaping Use   Vaping Use: Never used  Substance and Sexual Activity   Alcohol use: Not Currently    Alcohol/week: 0.0 standard drinks of alcohol   Drug use: No   Sexual activity: Yes  Other Topics Concern   Not on file  Social History Narrative   Married      2 sons      Retired      2  cups coffee/daily      Gym 3-4 times/week   Social Determinants of Health   Financial Resource Strain: Low Risk  (02/20/2022)   Overall Financial Resource Strain (CARDIA)    Difficulty of Paying Living Expenses: Not hard at all  Food Insecurity: No Food Insecurity (02/20/2022)   Hunger Vital Sign    Worried About Running Out of Food in the Last Year: Never true    [place]: Never true  Transportation Needs: No Transportation Needs (02/20/2022)   PRAPARE - Hydrologist (Medical): No    Lack of Transportation (Non-Medical): No  Physical Activity: Insufficiently Active (02/20/2022)   Exercise Vital Sign    Days of Exercise per Week: 7 days    Minutes of Exercise per Session: 20 min  Stress: No Stress Concern Present (02/20/2022)   [place]    Feeling of Stress : Not at all  Social Connections: Not on file  Intimate Partner Violence: Not At Risk (01/19/2020)   Humiliation, Afraid, Rape, and Kick questionnaire    Fear of Current or Ex-Partner: No    Emotionally Abused: No    Physically Abused: No    Sexually Abused: No    Review of Systems: See HPI, otherwise negative ROS  Physical Exam: BP 113/78   Pulse (!) 52   Temp 97.7 F (36.5 C) (Temporal)   Resp 16   Ht 5' 0.98" (1.549 m)   Wt 72.4 kg   SpO2 97%   BMI 30.18 kg/m  General:   Alert,  pleasant and cooperative in NAD Head:  Normocephalic and atraumatic. Lungs:  Clear to auscultation.    Heart:  Regular rate and rhythm.   Impression/Plan: Judith [name] is here for ophthalmic surgery.  Risks, benefits, limitations, and alternatives regarding ophthalmic surgery have been reviewed with the patient.  Questions have been answered.  All parties agreeable.   Leandrew Koyanagi, MD  05/13/2022, 7:30 AM

## 2022-05-14 ENCOUNTER — Encounter: Payer: Self-pay | Admitting: Ophthalmology

## 2022-06-01 NOTE — Telephone Encounter (Signed)
Titration reviewed by Dr. Jackie Plum cpap pressure 10cm.

## 2022-06-01 NOTE — Addendum Note (Signed)
Addended by: Claudette Head A on: 06/01/2022 03:35 PM   Modules accepted: Orders

## 2022-06-03 DIAGNOSIS — Z79899 Other long term (current) drug therapy: Secondary | ICD-10-CM | POA: Diagnosis not present

## 2022-06-03 DIAGNOSIS — Z87891 Personal history of nicotine dependence: Secondary | ICD-10-CM | POA: Diagnosis not present

## 2022-06-03 DIAGNOSIS — E785 Hyperlipidemia, unspecified: Secondary | ICD-10-CM | POA: Diagnosis not present

## 2022-06-03 DIAGNOSIS — D8685 Sarcoid myocarditis: Secondary | ICD-10-CM | POA: Diagnosis not present

## 2022-06-03 DIAGNOSIS — D869 Sarcoidosis, unspecified: Secondary | ICD-10-CM | POA: Diagnosis not present

## 2022-06-03 DIAGNOSIS — Z7952 Long term (current) use of systemic steroids: Secondary | ICD-10-CM | POA: Diagnosis not present

## 2022-06-03 DIAGNOSIS — R9439 Abnormal result of other cardiovascular function study: Secondary | ICD-10-CM | POA: Diagnosis not present

## 2022-06-04 ENCOUNTER — Other Ambulatory Visit: Payer: Self-pay | Admitting: Family Medicine

## 2022-06-04 DIAGNOSIS — Z1231 Encounter for screening mammogram for malignant neoplasm of breast: Secondary | ICD-10-CM

## 2022-06-10 DIAGNOSIS — D8685 Sarcoid myocarditis: Secondary | ICD-10-CM | POA: Diagnosis not present

## 2022-06-10 DIAGNOSIS — Z87891 Personal history of nicotine dependence: Secondary | ICD-10-CM | POA: Diagnosis not present

## 2022-06-10 DIAGNOSIS — M4316 Spondylolisthesis, lumbar region: Secondary | ICD-10-CM | POA: Diagnosis not present

## 2022-06-10 DIAGNOSIS — M48061 Spinal stenosis, lumbar region without neurogenic claudication: Secondary | ICD-10-CM | POA: Diagnosis not present

## 2022-06-11 ENCOUNTER — Ambulatory Visit (INDEPENDENT_AMBULATORY_CARE_PROVIDER_SITE_OTHER): Payer: Medicare HMO

## 2022-06-11 DIAGNOSIS — Z23 Encounter for immunization: Secondary | ICD-10-CM | POA: Diagnosis not present

## 2022-07-01 DIAGNOSIS — Z95818 Presence of other cardiac implants and grafts: Secondary | ICD-10-CM | POA: Diagnosis not present

## 2022-07-02 ENCOUNTER — Ambulatory Visit
Admission: RE | Admit: 2022-07-02 | Discharge: 2022-07-02 | Disposition: A | Discharge status: Home / Self Care | Payer: Medicare HMO | Source: Ambulatory Visit | Attending: Family Medicine | Admitting: Family Medicine

## 2022-07-02 DIAGNOSIS — Z1231 Encounter for screening mammogram for malignant neoplasm of breast: Secondary | ICD-10-CM | POA: Insufficient documentation

## 2022-07-03 DIAGNOSIS — Z95818 Presence of other cardiac implants and grafts: Secondary | ICD-10-CM | POA: Diagnosis not present

## 2022-07-04 DIAGNOSIS — D869 Sarcoidosis, unspecified: Secondary | ICD-10-CM | POA: Diagnosis not present

## 2022-07-08 DIAGNOSIS — D869 Sarcoidosis, unspecified: Secondary | ICD-10-CM | POA: Diagnosis not present

## 2022-07-08 DIAGNOSIS — D8685 Sarcoid myocarditis: Secondary | ICD-10-CM | POA: Diagnosis not present

## 2022-07-08 DIAGNOSIS — J849 Interstitial pulmonary disease, unspecified: Secondary | ICD-10-CM | POA: Diagnosis not present

## 2022-07-16 DIAGNOSIS — G4733 Obstructive sleep apnea (adult) (pediatric): Secondary | ICD-10-CM | POA: Diagnosis not present

## 2022-07-17 DIAGNOSIS — Z95818 Presence of other cardiac implants and grafts: Secondary | ICD-10-CM | POA: Diagnosis not present

## 2022-07-17 DIAGNOSIS — D8685 Sarcoid myocarditis: Secondary | ICD-10-CM | POA: Diagnosis not present

## 2022-07-17 DIAGNOSIS — R000 Tachycardia, unspecified: Secondary | ICD-10-CM | POA: Diagnosis not present

## 2022-08-03 DIAGNOSIS — D869 Sarcoidosis, unspecified: Secondary | ICD-10-CM | POA: Diagnosis not present

## 2022-08-04 DIAGNOSIS — D8685 Sarcoid myocarditis: Secondary | ICD-10-CM | POA: Diagnosis not present

## 2022-08-17 DIAGNOSIS — J849 Interstitial pulmonary disease, unspecified: Secondary | ICD-10-CM | POA: Diagnosis not present

## 2022-08-17 DIAGNOSIS — R918 Other nonspecific abnormal finding of lung field: Secondary | ICD-10-CM | POA: Diagnosis not present

## 2022-08-17 DIAGNOSIS — J479 Bronchiectasis, uncomplicated: Secondary | ICD-10-CM | POA: Diagnosis not present

## 2022-09-02 DIAGNOSIS — R5383 Other fatigue: Secondary | ICD-10-CM | POA: Diagnosis not present

## 2022-09-02 DIAGNOSIS — R079 Chest pain, unspecified: Secondary | ICD-10-CM | POA: Diagnosis not present

## 2022-09-02 DIAGNOSIS — R0602 Shortness of breath: Secondary | ICD-10-CM | POA: Diagnosis not present

## 2022-09-02 DIAGNOSIS — D8685 Sarcoid myocarditis: Secondary | ICD-10-CM | POA: Diagnosis not present

## 2022-09-02 DIAGNOSIS — Z7952 Long term (current) use of systemic steroids: Secondary | ICD-10-CM | POA: Diagnosis not present

## 2022-09-02 DIAGNOSIS — Z79899 Other long term (current) drug therapy: Secondary | ICD-10-CM | POA: Diagnosis not present

## 2022-09-02 DIAGNOSIS — Z87891 Personal history of nicotine dependence: Secondary | ICD-10-CM | POA: Diagnosis not present

## 2022-09-02 DIAGNOSIS — M545 Low back pain, unspecified: Secondary | ICD-10-CM | POA: Diagnosis not present

## 2022-09-03 DIAGNOSIS — D869 Sarcoidosis, unspecified: Secondary | ICD-10-CM | POA: Diagnosis not present

## 2022-09-10 DIAGNOSIS — X32XXXA Exposure to sunlight, initial encounter: Secondary | ICD-10-CM | POA: Diagnosis not present

## 2022-09-10 DIAGNOSIS — D225 Melanocytic nevi of trunk: Secondary | ICD-10-CM | POA: Diagnosis not present

## 2022-09-10 DIAGNOSIS — L821 Other seborrheic keratosis: Secondary | ICD-10-CM | POA: Diagnosis not present

## 2022-09-10 DIAGNOSIS — C44619 Basal cell carcinoma of skin of left upper limb, including shoulder: Secondary | ICD-10-CM | POA: Diagnosis not present

## 2022-09-10 DIAGNOSIS — D2262 Melanocytic nevi of left upper limb, including shoulder: Secondary | ICD-10-CM | POA: Diagnosis not present

## 2022-09-10 DIAGNOSIS — Z85820 Personal history of malignant melanoma of skin: Secondary | ICD-10-CM | POA: Diagnosis not present

## 2022-09-10 DIAGNOSIS — L570 Actinic keratosis: Secondary | ICD-10-CM | POA: Diagnosis not present

## 2022-09-10 DIAGNOSIS — D2261 Melanocytic nevi of right upper limb, including shoulder: Secondary | ICD-10-CM | POA: Diagnosis not present

## 2022-09-10 DIAGNOSIS — D485 Neoplasm of uncertain behavior of skin: Secondary | ICD-10-CM | POA: Diagnosis not present

## 2022-09-10 DIAGNOSIS — D0471 Carcinoma in situ of skin of right lower limb, including hip: Secondary | ICD-10-CM | POA: Diagnosis not present

## 2022-10-01 DIAGNOSIS — Z95818 Presence of other cardiac implants and grafts: Secondary | ICD-10-CM | POA: Diagnosis not present

## 2022-10-04 DIAGNOSIS — D869 Sarcoidosis, unspecified: Secondary | ICD-10-CM | POA: Diagnosis not present

## 2022-10-07 DIAGNOSIS — M5416 Radiculopathy, lumbar region: Secondary | ICD-10-CM | POA: Diagnosis not present

## 2022-10-07 DIAGNOSIS — M4316 Spondylolisthesis, lumbar region: Secondary | ICD-10-CM | POA: Diagnosis not present

## 2022-10-07 DIAGNOSIS — M48061 Spinal stenosis, lumbar region without neurogenic claudication: Secondary | ICD-10-CM | POA: Diagnosis not present

## 2022-10-18 DIAGNOSIS — G4733 Obstructive sleep apnea (adult) (pediatric): Secondary | ICD-10-CM | POA: Diagnosis not present

## 2022-10-28 DIAGNOSIS — D869 Sarcoidosis, unspecified: Secondary | ICD-10-CM | POA: Diagnosis not present

## 2022-10-28 DIAGNOSIS — D860 Sarcoidosis of lung: Secondary | ICD-10-CM | POA: Diagnosis not present

## 2022-10-28 DIAGNOSIS — D8685 Sarcoid myocarditis: Secondary | ICD-10-CM | POA: Diagnosis not present

## 2022-10-28 DIAGNOSIS — R21 Rash and other nonspecific skin eruption: Secondary | ICD-10-CM | POA: Diagnosis not present

## 2022-10-28 DIAGNOSIS — R0602 Shortness of breath: Secondary | ICD-10-CM | POA: Diagnosis not present

## 2022-10-28 DIAGNOSIS — Z7952 Long term (current) use of systemic steroids: Secondary | ICD-10-CM | POA: Diagnosis not present

## 2022-10-28 DIAGNOSIS — K219 Gastro-esophageal reflux disease without esophagitis: Secondary | ICD-10-CM | POA: Diagnosis not present

## 2022-10-28 DIAGNOSIS — G4730 Sleep apnea, unspecified: Secondary | ICD-10-CM | POA: Diagnosis not present

## 2022-10-29 DIAGNOSIS — L905 Scar conditions and fibrosis of skin: Secondary | ICD-10-CM | POA: Diagnosis not present

## 2022-10-29 DIAGNOSIS — D0471 Carcinoma in situ of skin of right lower limb, including hip: Secondary | ICD-10-CM | POA: Diagnosis not present

## 2022-10-29 DIAGNOSIS — C44619 Basal cell carcinoma of skin of left upper limb, including shoulder: Secondary | ICD-10-CM | POA: Diagnosis not present

## 2022-11-02 DIAGNOSIS — D869 Sarcoidosis, unspecified: Secondary | ICD-10-CM | POA: Diagnosis not present

## 2022-11-20 ENCOUNTER — Ambulatory Visit (INDEPENDENT_AMBULATORY_CARE_PROVIDER_SITE_OTHER): Payer: Medicare HMO | Admitting: Family Medicine

## 2022-11-20 ENCOUNTER — Encounter: Payer: Self-pay | Admitting: Family Medicine

## 2022-11-20 ENCOUNTER — Ambulatory Visit (INDEPENDENT_AMBULATORY_CARE_PROVIDER_SITE_OTHER)
Admission: RE | Admit: 2022-11-20 | Discharge: 2022-11-20 | Disposition: A | Discharge status: Home / Self Care | Payer: Medicare HMO | Source: Ambulatory Visit | Attending: Family Medicine | Admitting: Family Medicine

## 2022-11-20 VITALS — BP 126/68 | HR 98 | Temp 97.7°F | Ht 62.0 in | Wt 161.5 lb

## 2022-11-20 DIAGNOSIS — M79672 Pain in left foot: Secondary | ICD-10-CM | POA: Insufficient documentation

## 2022-11-20 NOTE — Patient Instructions (Addendum)
Use some voltaren gel on foot  Use ice - massage with a frozen water bottle  Don't go barefoot Wear your most supportive shoe   Tylenol as needed    Watch for redness or swelling or new symptoms

## 2022-11-20 NOTE — Progress Notes (Signed)
Subjective:    Patient ID: Judith [name], female    DOB: [birth year], 77 y.o.   MRN: [id number]  HPI Pt presents with c/o L foot pain    Wt Readings from Last 3 Encounters:  11/20/22 161 lb 8 oz (73.3 kg)  05/13/22 159 lb 9.8 oz (72.4 kg)  04/29/22 159 lb 9.8 oz (72.4 kg)   29.54 kg/m  Vitals:   11/20/22 1543  BP: 126/68  Pulse: 98  Temp: 97.7 F (36.5 C)  SpO2: 95%   Has h/o osteopenia  Prednisone status  7.5 mg daily   L foot hurts for a week  Woke up with it  Now throbs at night  Could barely walk into the kitchen   Throbs when dependent   Feels a little swollen    Has a h/o plantar fasciitis  No h/o gout  No new activity  No new shoes Never goes barefoot   Has been sitting more because of back pain   Otc Tylenol prn     Foot xr today DG Foot Complete Left  Result Date: 11/20/2022 CLINICAL DATA:  Left foot pain EXAM: LEFT FOOT - COMPLETE 3+ VIEW COMPARISON:  03/13/2020 FINDINGS: Frontal, oblique, and lateral views of the left foot are obtained. No acute fracture, subluxation, or dislocation. Mild multifocal osteoarthritis greatest throughout the interphalangeal joints. Prominent superior and inferior calcaneal spurs. Soft tissues are unremarkable. IMPRESSION: 1. Multifocal degenerative changes.  No acute bony abnormality. Electronically Signed   By: Sharlet Salina M.D.   On: 11/20/2022 15:56    Patient Active Problem List   Diagnosis Date Noted   Left foot pain 11/20/2022   Positive colorectal cancer screening using Cologuard test 02/23/2022   Chronic respiratory failure with hypoxia (HCC) 06/20/2020   OSA (obstructive sleep apnea) 06/20/2020   Unknown status of immunity to COVID-19 virus 03/18/2020   Degeneration of thoracic intervertebral disc 06/17/2018   Encounter for screening mammogram for breast cancer 01/07/2018   Dyspnea 10/25/2017   Sinus tachycardia 10/25/2017   Multiple lung nodules on CT 10/23/2017   Spondylolisthesis, lumbar  region 10/14/2017   Routine general medical examination at a health care facility 01/06/2016   IBS (irritable colon syndrome) 05/31/2015   Colon polyps 05/31/2015   Estrogen deficiency 12/05/2014   Encounter for Medicare annual wellness exam 09/24/2013   Personal history of other malignant neoplasm of skin 09/21/2013   Sarcoidosis 08/09/2012   Mixed incontinence urge and stress 03/16/2012   ALOPECIA 11/14/2010   GERD 01/24/2009   NEOPLASM, MALIGNANT, BREAST, HX OF 01/02/2009   Hyperlipidemia 11/17/2007   Osteopenia 11/17/2007   TINNITUS 09/07/2007   ALLERGIC RHINITIS 09/07/2007   DIVERTICULOSIS, COLON 09/07/2007   Fatty liver 09/07/2007   History of diverticulitis 08/12/2007   Past Medical History:  Diagnosis Date   Allergic rhinitis    Allergy    Arthritis    Atrophic vaginitis    Breast cancer (HCC)    Right mastectomy   Diverticulosis    GERD (gastroesophageal reflux disease)    H/O: pneumonia    as child-viral pneumonia   Headache    History of shingles    HLD (hyperlipidemia)    pt denies   IBS (irritable bowel syndrome)    Implantable loop recorder present    OSA (obstructive sleep apnea) 06/20/2020   CPAP   Osteopenia    Sarcoidosis    Sliding hiatal hernia    Past Surgical History:  Procedure Laterality Date   APPENDECTOMY  BASAL CELL CARCINOMA EXCISION     scalp   BASAL CELL CARCINOMA EXCISION  08/2016   nose   CATARACT EXTRACTION W/PHACO Right 04/29/2022   Procedure: CATARACT EXTRACTION PHACO AND INTRAOCULAR LENS PLACEMENT (IOC) RIGHT VIVITY LENS 10.90 01:10.5;  Surgeon: Lockie Mola, MD;  [facility];  Service: Ophthalmology;  Laterality: Right;  sleep apnea   CATARACT EXTRACTION W/PHACO Left 05/13/2022   Procedure: CATARACT EXTRACTION PHACO AND INTRAOCULAR LENS PLACEMENT (IOC) LEFT VIVITY LENS 8.84 01:10.1;  Surgeon: Lockie Mola, MD;  Location: [facility];  Service: Ophthalmology;  Laterality: Left;  sleep  apnea   COLONOSCOPY  02/2010   divertics, rpt 5 years   DEXA  2005   osteopenia, no change from 2003   DILATION AND CURETTAGE OF UTERUS  10/1999   LAPAROSCOPIC TOTAL HYSTERECTOMY  05/2002   for fibroids/polyp   MASTECTOMY Right 02/2009   right breast cancer   MOHS SURGERY  04/17/2022   Nose   POLYPECTOMY     TOTAL ABDOMINAL HYSTERECTOMY  2003   for fibroids/polyps   VIDEO BRONCHOSCOPY WITH ENDOBRONCHIAL NAVIGATION N/A 02/01/2017   Procedure: VIDEO BRONCHOSCOPY WITH ENDOBRONCHIAL NAVIGATION;  Surgeon: Loreli Slot, MD;  Location: [facility];  Service: Thoracic;  Laterality: N/A;   Social History   Tobacco Use   Smoking status: Former    Packs/day: 1.00    Years: 3.00    Additional pack years: 0.00    Total pack years: 3.00    Types: Cigarettes    Start date: 63    Quit date: 08/31/1968    Years since quitting: 54.2   Smokeless tobacco: Never  Vaping Use   Vaping Use: Never used  Substance Use Topics   Alcohol use: Not Currently    Alcohol/week: 0.0 standard drinks of alcohol   Drug use: No   Family History  Problem Relation Age of Onset   Prostate cancer Father    Prostate cancer Brother    Lymphoma Brother    Heart attack Paternal Grandmother    Stomach cancer Maternal Grandfather    Colon cancer Neg Hx    Rectal cancer Neg Hx    Esophageal cancer Neg Hx    Allergies  Allergen Reactions   Niacin And Related Other (See Comments)    Caused black and blue marks all over body   Meloxicam Rash   Current Outpatient Medications on File Prior to Visit  Medication Sig Dispense Refill   acetaminophen (TYLENOL) 500 MG tablet Take 500 mg by mouth every 6 (six) hours as needed.     Adalimumab 40 MG/0.4ML PNKT Inject into the skin.     Biotin 5 MG TABS Take by mouth daily.     calcium-vitamin D (OSCAL WITH D) 500-200 MG-UNIT per tablet Take 1 tablet by mouth once a week.      cholecalciferol (VITAMIN D3) 25 MCG (1000 UNIT) tablet Take 1,000 Units by mouth daily.      cyclobenzaprine (FLEXERIL) 10 MG tablet Take 1 tablet (10 mg total) by mouth 3 (three) times daily as needed for muscle spasms (severe back spasm). Do not take with methocarbamol 30 tablet 1   diclofenac sodium (VOLTAREN) 1 % GEL Apply 2 g topically 3 (three) times daily as needed. 1 Tube 11   erythromycin ophthalmic ointment SMARTSIG:In Eye(s)     famotidine (PEPCID) 10 MG tablet Take 10 mg by mouth 2 (two) times daily as needed for heartburn or indigestion.     FIBER SELECT GUMMIES  PO Take 2 tablets by mouth at bedtime.     fish oil-omega-3 fatty acids 1000 MG capsule Take 1 g by mouth once a week.     fluticasone (FLONASE) 50 MCG/ACT nasal spray Place 2 sprays into both nostrils daily as needed. 48 mL 2   ibuprofen (ADVIL,MOTRIN) 200 MG tablet Take 400 mg by mouth every 8 (eight) hours as needed.      Lifitegrast (XIIDRA) 5 % SOLN Apply to eye.     methocarbamol (ROBAXIN) 500 MG tablet TAKE 1 TABLET BY MOUTH EVERY 8 HOURS AS NEEDED FOR MUSCLE SPASMS. 90 tablet 5   metoprolol succinate (TOPROL-XL) 25 MG 24 hr tablet      Multiple Vitamin (MULTIVITAMIN WITH MINERALS) TABS tablet Take 1 tablet by mouth at bedtime.     neomycin-polymyxin-dexameth (MAXITROL) 0.1 % OINT Place 1 application  into both eyes See admin instructions. PRN: TWICE DAILY X1 WEEK & THEN DECREASE TO ONCE DAILY FOR X2 WEEKS     OXYGEN Inhale into the lungs at bedtime. 2.5 - 3.0 L     Polyethyl Glycol-Propyl Glycol (SYSTANE) 0.4-0.3 % SOLN Apply to eye.     PREDNISONE PO Take 5 mg by mouth daily with breakfast. Take 15mg  by mouth daily     vitamin B-12 (CYANOCOBALAMIN) 500 MCG tablet Take 500 mcg by mouth daily.     No current facility-administered medications on file prior to visit.    Review of Systems  Constitutional:  Negative for activity change, appetite change, fatigue, fever and unexpected weight change.  HENT:  Negative for congestion, ear pain, rhinorrhea, sinus pressure and sore throat.   Eyes:  Negative for  pain, redness and visual disturbance.  Respiratory:  Negative for cough, shortness of breath and wheezing.   Cardiovascular:  Negative for chest pain and palpitations.  Gastrointestinal:  Negative for abdominal pain, blood in stool, constipation and diarrhea.  Endocrine: Negative for polydipsia and polyuria.  Genitourinary:  Negative for dysuria, frequency and urgency.  Musculoskeletal:  Positive for arthralgias. Negative for back pain and myalgias.       Left food pain   Skin:  Negative for pallor and rash.  Allergic/Immunologic: Negative for environmental allergies.  Neurological:  Negative for dizziness, syncope and headaches.  Hematological:  Negative for adenopathy. Does not bruise/bleed easily.  Psychiatric/Behavioral:  Negative for decreased concentration and dysphoric mood. The patient is not nervous/anxious.        Objective:   Physical Exam Constitutional:      General: She is not in acute distress.    Appearance: Normal appearance. She is well-developed. She is obese. She is not ill-appearing or diaphoretic.  HENT:     Head: Normocephalic and atraumatic.  Eyes:     Conjunctiva/sclera: Conjunctivae normal.     Pupils: Pupils are equal, round, and reactive to light.  Neck:     Thyroid: No thyromegaly.     Vascular: No carotid bruit or JVD.  Cardiovascular:     Rate and Rhythm: Normal rate and regular rhythm.     Heart sounds: Normal heart sounds.     No gallop.  Pulmonary:     Effort: Pulmonary effort is normal. No respiratory distress.     Breath sounds: Normal breath sounds. No wheezing or rales.  Abdominal:     General: There is no distension or abdominal bruit.     Palpations: Abdomen is soft.  Musculoskeletal:     Cervical back: Normal range of motion and neck supple.  Right lower leg: No edema.     Left lower leg: No edema.     Left foot: Normal range of motion and normal capillary refill. Tenderness present. No swelling, deformity, bunion, Charcot foot,  foot drop, prominent metatarsal heads or crepitus. Normal pulse.     Comments: L foot  Tender plantar surface of foot at calcaneous and arch  No swelling or skin change   She favors the RLE with gait today    Lymphadenopathy:     Cervical: No cervical adenopathy.  Skin:    General: Skin is warm and dry.     Coloration: Skin is not pale.     Findings: No rash.  Neurological:     Mental Status: She is alert.     Sensory: No sensory deficit.     Motor: No weakness.     Coordination: Coordination normal.     Deep Tendon Reflexes: Reflexes are normal and symmetric. Reflexes normal.  Psychiatric:        Mood and Affect: Mood normal.           Assessment & Plan:   Problem List Items Addressed This Visit       Other   Left foot pain - Primary    Pain for about a week but no trauma  Primarily plantar and worst firs thing in the am  She has has plantar fasciitis in the past -this feels worse (reviewed podiatry notes from Dr Al Corpus from 2021 and has had at least one injection) Xray is neg for fracture, does have calcaneal spurs and some oa  Taking tylenol  Recommended referral- she would rather see Dr Patsy Lager than podiatry  Enc hard soled shoes  Ice/massage  Voltaren gel  Ins to call if worse       Relevant Orders   DG Foot Complete Left (Completed)

## 2022-11-22 NOTE — Assessment & Plan Note (Addendum)
Pain for about a week but no trauma  Primarily plantar and worst firs thing in the am  She has has plantar fasciitis in the past -this feels worse (reviewed podiatry notes from Dr Milinda Pointer from 2021 and has had at least one injection) Xray is neg for fracture, does have calcaneal spurs and some oa  Taking tylenol  Recommended referral- she would rather see Dr Lorelei Pont than podiatry  Enc hard soled shoes  Ice/massage  Voltaren gel  Ins to call if worse

## 2022-11-30 ENCOUNTER — Encounter: Payer: Self-pay | Admitting: Family Medicine

## 2022-11-30 ENCOUNTER — Ambulatory Visit (INDEPENDENT_AMBULATORY_CARE_PROVIDER_SITE_OTHER): Payer: Medicare HMO | Admitting: Family Medicine

## 2022-11-30 VITALS — BP 120/74 | HR 81 | Temp 98.0°F | Ht 62.0 in | Wt 162.1 lb

## 2022-11-30 DIAGNOSIS — M76822 Posterior tibial tendinitis, left leg: Secondary | ICD-10-CM

## 2022-11-30 DIAGNOSIS — M722 Plantar fascial fibromatosis: Secondary | ICD-10-CM

## 2022-11-30 MED ORDER — PREDNISONE 20 MG PO TABS: 40.0000 mg | ORAL_TABLET | Freq: Every day | ORAL | 0 refills | Status: DC

## 2022-11-30 NOTE — Progress Notes (Signed)
Judith [name] T. Judith Weaber, MD, [place] [facility] [place], [address]  Phone: [phone]  FAX: [place] - 77 y.o. female  MRN [id number]  Date of Birth: 1946-06-05  Date: 11/30/2022  PCP: Abner Greenspan, MD  Referral: Abner Greenspan, MD  Chief Complaint  Patient presents with   Foot Pain    Left   Subjective:   Judith [name] is a 77 y.o. very pleasant female patient with Body mass index is 29.65 kg/m. who presents with the following:  Left heel pain: She is predominately having pain in the plantar aspect of the left foot and also just distal to this in the plantar fascia.  She also has some pain in the medial ankle just posterior to the malleolus and up into the lower leg medially.  She does not recall any kind of specific incident or injury that she can think of.  This is acute on chronic, and she has had some plantar fasciitis for greater than a year or multiyear.  3 weeks it was bothering her a lot, improving some.  Saw Dr. Milinda Pointer for a year.  Not walking like she used to.   Taking Prednisone 7.5 mg each day.  She is taking daily prednisone as well as other Biologics for cardiac sarcoid.   Review of Systems is noted in the HPI, as appropriate  Objective:   BP 120/74   Pulse 81   Temp 98 F (36.7 C) (Temporal)   Ht 5\' 2"  (1.575 m)   Wt 162 lb 2 oz (73.5 kg)   SpO2 96%   BMI 29.65 kg/m   GEN: No acute distress; alert,appropriate. PULM: Breathing comfortably in no respiratory distress PSYCH: Normally interactive.   Left foot: Nontender at all toes, MTP joints.  Nontender along the metatarsal shafts.  Nontender at the medial and lateral malleoli.  Nontender at the calcaneus.  Nontender at the Achilles tendon.  Nontender along the peroneal tendon.  She does have some mild tenderness along the posterior tibialis tendon as it wraps around the medial malleolus.  Also tender  in the distal medial lower extremity in the region of the posterior tibialis.  She has maximal tenderness on the plantar surface of the calcaneus just distal to the insertion of the plantar fascia.  Laboratory and Imaging Data:  Assessment and Plan:     ICD-10-CM   1. Plantar fasciitis, left  M72.2     2. Tibialis posterior tendinitis, left  M76.822      Acute on chronic plantar fasciitis with exacerbation on the left side.  Today, this is more distal to the insertion, but it remains in the plantar fascia.  She also has some mild tibialis tendinitis on the left as well.  She is going to continue with her comfortable Finn comfort shoes, and I am going to give her a round of some oral prednisone to take.  Medication Management during today's office visit: Meds ordered this encounter  Medications   predniSONE (DELTASONE) 20 MG tablet    Sig: Take 2 tablets (40 mg total) by mouth daily.    Dispense:  14 tablet    Refill:  0   There are no discontinued medications.  Orders placed today for conditions managed today: No orders of the defined types were placed in this encounter.   Disposition: No follow-ups on file.  Dragon [employer] One speech-to-text software was used for transcription in  this dictation.  Possible transcriptional errors can occur using Editor, commissioning.   Signed,  Maud Deed. Charleen Madera, MD   Outpatient Encounter Medications as of 11/30/2022  Medication Sig   acetaminophen (TYLENOL) 500 MG tablet Take 500 mg by mouth every 6 (six) hours as needed.   Adalimumab 40 MG/0.4ML PNKT Inject into the skin.   Biotin 5 MG TABS Take by mouth daily.   calcium-vitamin D (OSCAL WITH D) 500-200 MG-UNIT per tablet Take 1 tablet by mouth once a week.    cholecalciferol (VITAMIN D3) 25 MCG (1000 UNIT) tablet Take 1,000 Units by mouth daily.   cyclobenzaprine (FLEXERIL) 10 MG tablet Take 1 tablet (10 mg total) by mouth 3 (three) times daily as needed for muscle spasms (severe back  spasm). Do not take with methocarbamol   diclofenac sodium (VOLTAREN) 1 % GEL Apply 2 g topically 3 (three) times daily as needed.   erythromycin ophthalmic ointment SMARTSIG:In Eye(s)   famotidine (PEPCID) 10 MG tablet Take 10 mg by mouth 2 (two) times daily as needed for heartburn or indigestion.   FIBER SELECT GUMMIES PO Take 2 tablets by mouth at bedtime.   fish oil-omega-3 fatty acids 1000 MG capsule Take 1 g by mouth once a week.   fluticasone (FLONASE) 50 MCG/ACT nasal spray Place 2 sprays into both nostrils daily as needed.   FOLIC ACID PO Take 1 tablet by mouth daily.   ibuprofen (ADVIL,MOTRIN) 200 MG tablet Take 400 mg by mouth every 8 (eight) hours as needed.    Lifitegrast (XIIDRA) 5 % SOLN Apply to eye.   methocarbamol (ROBAXIN) 500 MG tablet TAKE 1 TABLET BY MOUTH EVERY 8 HOURS AS NEEDED FOR MUSCLE SPASMS.   methotrexate (RHEUMATREX) 2.5 MG tablet Take 20 mg by mouth once a week.   metoprolol succinate (TOPROL-XL) 25 MG 24 hr tablet    Multiple Vitamin (MULTIVITAMIN WITH MINERALS) TABS tablet Take 1 tablet by mouth at bedtime.   neomycin-polymyxin-dexameth (MAXITROL) 0.1 % OINT Place 1 application  into both eyes See admin instructions. PRN: TWICE DAILY X1 WEEK & THEN DECREASE TO ONCE DAILY FOR X2 WEEKS   OXYGEN Inhale into the lungs at bedtime. 2.5 - 3.0 L   Polyethyl Glycol-Propyl Glycol (SYSTANE) 0.4-0.3 % SOLN Apply to eye.   predniSONE (DELTASONE) 2.5 MG tablet Take 2.5 mg by mouth daily with breakfast.   predniSONE (DELTASONE) 20 MG tablet Take 2 tablets (40 mg total) by mouth daily.   PREDNISONE PO Take 5 mg by mouth daily with breakfast.   vitamin B-12 (CYANOCOBALAMIN) 500 MCG tablet Take 500 mcg by mouth daily.   WIXELA INHUB 250-50 MCG/ACT AEPB Inhale 1 puff into the lungs in the morning and at bedtime.   No facility-administered encounter medications on file as of 11/30/2022.

## 2022-12-02 DIAGNOSIS — E785 Hyperlipidemia, unspecified: Secondary | ICD-10-CM | POA: Diagnosis not present

## 2022-12-02 DIAGNOSIS — D8685 Sarcoid myocarditis: Secondary | ICD-10-CM | POA: Diagnosis not present

## 2022-12-02 DIAGNOSIS — D84821 Immunodeficiency due to drugs: Secondary | ICD-10-CM | POA: Diagnosis not present

## 2022-12-02 DIAGNOSIS — G4733 Obstructive sleep apnea (adult) (pediatric): Secondary | ICD-10-CM | POA: Diagnosis not present

## 2022-12-02 DIAGNOSIS — D869 Sarcoidosis, unspecified: Secondary | ICD-10-CM | POA: Diagnosis not present

## 2022-12-02 DIAGNOSIS — Z87891 Personal history of nicotine dependence: Secondary | ICD-10-CM | POA: Diagnosis not present

## 2022-12-02 DIAGNOSIS — Z95818 Presence of other cardiac implants and grafts: Secondary | ICD-10-CM | POA: Diagnosis not present

## 2022-12-03 DIAGNOSIS — D869 Sarcoidosis, unspecified: Secondary | ICD-10-CM | POA: Diagnosis not present

## 2022-12-14 DIAGNOSIS — H04123 Dry eye syndrome of bilateral lacrimal glands: Secondary | ICD-10-CM | POA: Diagnosis not present

## 2022-12-14 DIAGNOSIS — Z961 Presence of intraocular lens: Secondary | ICD-10-CM | POA: Diagnosis not present

## 2022-12-31 ENCOUNTER — Telehealth: Payer: Self-pay | Admitting: Family Medicine

## 2022-12-31 DIAGNOSIS — C50911 Malignant neoplasm of unspecified site of right female breast: Secondary | ICD-10-CM | POA: Diagnosis not present

## 2022-12-31 NOTE — Telephone Encounter (Signed)
Contacted Judith [name] to schedule their annual wellness visit. Appointment made for 01/05/2023.  [facility] Care Guide [facility] AWV TEAM Direct Dial: [phone]

## 2023-01-01 DIAGNOSIS — Z95818 Presence of other cardiac implants and grafts: Secondary | ICD-10-CM | POA: Diagnosis not present

## 2023-01-02 DIAGNOSIS — D869 Sarcoidosis, unspecified: Secondary | ICD-10-CM | POA: Diagnosis not present

## 2023-01-18 DIAGNOSIS — G4733 Obstructive sleep apnea (adult) (pediatric): Secondary | ICD-10-CM | POA: Diagnosis not present

## 2023-02-01 DIAGNOSIS — Z4509 Encounter for adjustment and management of other cardiac device: Secondary | ICD-10-CM | POA: Diagnosis not present

## 2023-02-02 DIAGNOSIS — D869 Sarcoidosis, unspecified: Secondary | ICD-10-CM | POA: Diagnosis not present

## 2023-02-03 DIAGNOSIS — D869 Sarcoidosis, unspecified: Secondary | ICD-10-CM | POA: Diagnosis not present

## 2023-02-05 DIAGNOSIS — G8918 Other acute postprocedural pain: Secondary | ICD-10-CM | POA: Diagnosis not present

## 2023-02-05 DIAGNOSIS — M4316 Spondylolisthesis, lumbar region: Secondary | ICD-10-CM | POA: Diagnosis not present

## 2023-02-05 DIAGNOSIS — M48061 Spinal stenosis, lumbar region without neurogenic claudication: Secondary | ICD-10-CM | POA: Diagnosis not present

## 2023-02-05 DIAGNOSIS — R202 Paresthesia of skin: Secondary | ICD-10-CM | POA: Diagnosis not present

## 2023-02-08 ENCOUNTER — Telehealth: Payer: Self-pay

## 2023-02-08 NOTE — Telephone Encounter (Signed)
Yes, the [facility] does her sleep. She will keep appt.

## 2023-02-08 NOTE — Telephone Encounter (Signed)
According to chart, patient is currently seeing [facility].  Lm for patient confirm that she wished to keep scheduled appt with Dr. Belia Heman for 02/09/2023

## 2023-02-08 NOTE — Telephone Encounter (Signed)
I spoke with the patient. She see the Pulmonologist at [facility] for her Sarcoid and she will still see Dr. Belia Heman for her OSA.  Nothing further needed.

## 2023-02-09 ENCOUNTER — Encounter: Payer: Self-pay | Admitting: Internal Medicine

## 2023-02-09 ENCOUNTER — Ambulatory Visit: Payer: Medicare HMO | Admitting: Internal Medicine

## 2023-02-09 VITALS — BP 132/82 | HR 59 | Temp 97.1°F | Ht 62.0 in | Wt 164.0 lb

## 2023-02-09 DIAGNOSIS — D869 Sarcoidosis, unspecified: Secondary | ICD-10-CM | POA: Diagnosis not present

## 2023-02-09 DIAGNOSIS — G4733 Obstructive sleep apnea (adult) (pediatric): Secondary | ICD-10-CM

## 2023-02-09 NOTE — Patient Instructions (Addendum)
Excellent job A+ Continue CPAP as prescribed  We will check oxygen levels while you are on CPAP without your oxygen 2.5 running Plan to disconnect oxygen when overnight pulse oximetry arrives

## 2023-02-09 NOTE — Progress Notes (Signed)
Name: Judith [name] MRN: [id number] DOB: October 12, 1945     CONSULTATION DATE:02/09/2023  REFERRING MD :  Dr. Dorris Fetch   STUDIES:  CT chest Jan 26, 2017 I have Independently reviewed images of  CT chest    Interpretation:B/l pulm nodules   Previous history 77 year old  abnormal CT chest with bilateral pulmonary nodules Patient has a diagnosis and history of sarcoidosis of the scalp In February 2018 patient developed a chronic cough patient was worked up for chest x-ray and a CT scan of the chest which revealed several pulmonary nodules Patient was then referred to cardiothoracic surgery for further evaluation Dr. 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 Patient sees ophthalmology on an annual basis to assess for sarcoid 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  test WNL ONO revelas signs hypoxia Placed on 2 L [place] oxygen therapy  patient diagnosed with sleep apnea with AHI of 13 123 events Lowest O2 sat was 75%  CC Follow-up OSA Follow-up sarcoid Follow-up hypoxia  HPI Diagnosis stage IV sarcoid Seems to be stable at this time Intermittent shortness of breath and dyspnea on exertion Follows up with [facility] Cardiac sarcoid Chronic immunosuppressive therapy Immunosuppressive therapy with Humira Chronic prednisone therapy at 5 mg daily   Hypoxia Continue oxygen at nighttime 2 L nasal cannula Will reassess the need for oxygen  requirements while she is on CPAP   OSA well controlled  Well-controlled sleep apnea Excellent compliance report Discussed with patient in detail Auto CPAP 5-13 100% compliance AHI reduced to less than 1  CARDIAC SARCOIDOSIS Dx at [facility] Therapy-chronic prednisone transitioned to MTX therapy Patient will need prolonged immunotherapy indefinitely  No exacerbation at this time No evidence of heart failure at this time No evidence or signs of infection at this time No respiratory distress No fevers, chills, nausea, vomiting, diarrhea No evidence of lower extremity edema No evidence hemoptysis     Allergies  Allergen Reactions   Niacin And Related Other (See Comments)    Caused black and blue marks all over body   Meloxicam Rash       BP 132/82 (BP Location: Left Arm, Cuff Size: Normal)   Pulse (!) 59   Temp (!) 97.1 F (36.2 C)   Ht 5\' 2"  (1.575 m)   Wt 164 lb (74.4 kg)   SpO2 96%   BMI 30.00 kg/m       Review of Systems: Gen:  Denies  fever, sweats, chills weight loss  HEENT: Denies blurred vision, double vision, ear pain, eye pain, hearing loss, nose bleeds, sore throat Cardiac:  No dizziness, chest pain or heaviness, chest tightness,edema, No JVD Resp:   No cough, -sputum production, -shortness of breath,-wheezing, -hemoptysis,  Other:  All other systems negative   Physical Examination:   General Appearance: No distress  EYES PERRLA, EOM intact.   NECK Supple, No JVD Pulmonary: normal breath sounds, No wheezing.  CardiovascularNormal S1,S2.  No m/r/g.   Abdomen: Benign, Soft, non-tender. 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 with nocturnal hypoxia related to underlying sleep apnea which stage  IV pulmonary sarcoid   Cardiac sarcoidosis Patient on chronic immunosuppressive therapy Follow-up with [facility] On Humira, chronic prednisone No arrhythmias in the last 6 months Follow-up cardiology   Sarcoidosis pulmonary stage IV Continue oxygen as prescribed Patient sees [facility] and is on immunosuppressive therapy Will need overnight pulse oximetry on CPAP  OSA moderate Excellent compliance report reviewed in detail to patient Patient with excellent compliance report Uses nasal pillows Benefits from CPAP therapy   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 along with OSA Continue oxygen as prescribed however we will reconfirm oxygen needs with overnight pulse oximetry  Previous office visit assessment She had an overnight pulse oximetry to determine that there is no significant hypoxia but she is worried and she would like to increase her oxygen levels to 4L [place] Patient needs oxygen for survival I will need overnight pulse oximetry to assess for nocturnal hypoxia on CPAP     MEDICATION ADJUSTMENTS/LABS AND TESTS ORDERED: Continue immunosuppressive therapy Continue auto CPAP  Continue oxygen therapy as prescribed  Check overnight pulse oximetry for nocturnal hypoxia while on CPAP Follow-up [facility] sarcoid clinic  CURRENT MEDICATIONS REVIEWED AT LENGTH WITH PATIENT TODAY   Patient satisfied with Plan of action and management. All questions answered  Follow up in 1 year  Total time spent 35 minutes   Lucie Leather, M.D.  [facility]  Medical Director [facility] Medical Director [facility] Cardio-Pulmonary Department

## 2023-02-10 ENCOUNTER — Ambulatory Visit (INDEPENDENT_AMBULATORY_CARE_PROVIDER_SITE_OTHER): Payer: Medicare HMO

## 2023-02-10 VITALS — Ht 61.0 in | Wt 161.0 lb

## 2023-02-10 DIAGNOSIS — Z780 Asymptomatic menopausal state: Secondary | ICD-10-CM | POA: Diagnosis not present

## 2023-02-10 DIAGNOSIS — Z0000 Encounter for general adult medical examination without abnormal findings: Secondary | ICD-10-CM | POA: Diagnosis not present

## 2023-02-10 NOTE — Progress Notes (Signed)
I connected with  Judith [name] on 02/10/23 by a audio enabled telemedicine application and verified that I am speaking with the correct person using two identifiers.  Patient Location: Home  Provider Location: Home Office  I discussed the limitations of evaluation and management by telemedicine. The patient expressed understanding and agreed to proceed.  Subjective:   Judith [name] is a 77 y.o. female who presents for Medicare Annual (Subsequent) preventive examination.  Review of Systems      Cardiac Risk Factors include: advanced age (>16men, >50 women);sedentary lifestyle     Objective:    Today's Vitals   02/10/23 1332  Weight: 161 lb (73 kg)  Height: 5\' 1"  (1.549 m)   Body mass index is 30.42 kg/m.     02/10/2023    1:45 PM 05/13/2022    7:00 AM 04/29/2022   11:07 AM 02/20/2022    3:06 PM 01/19/2020   10:34 AM 01/18/2019    9:45 AM 12/24/2017   10:23 AM  Advanced Directives  Does Patient Have a Medical Advance Directive? Yes Yes Yes Yes Yes Yes Yes  Type of Estate agent of [place];Living will Healthcare Power of [place];Living will Healthcare Power of [place];Living will Healthcare Power of [place];Living will Healthcare Power of [place];Living will Healthcare Power of [place];Living will Healthcare Power of [place];Living will  Does patient want to make changes to medical advance directive?  No - Patient declined No - Patient declined      Copy of Healthcare Power of Attorney in Chart? No - copy requested No - copy requested No - copy requested No - copy requested No - copy requested No - copy requested No - copy requested    Current Medications (verified) Outpatient Encounter Medications as of 02/10/2023  Medication Sig   acetaminophen (TYLENOL) 500 MG tablet Take 500 mg by mouth every 6 (six) hours as needed.   Adalimumab 40 MG/0.4ML PNKT Inject into the skin.   Biotin 5 MG TABS Take by mouth daily.   calcium-vitamin D (OSCAL  WITH D) 500-200 MG-UNIT per tablet Take 1 tablet by mouth once a week.    cholecalciferol (VITAMIN D3) 25 MCG (1000 UNIT) tablet Take 1,000 Units by mouth daily.   cyclobenzaprine (FLEXERIL) 10 MG tablet Take 1 tablet (10 mg total) by mouth 3 (three) times daily as needed for muscle spasms (severe back spasm). Do not take with methocarbamol   famotidine (PEPCID) 10 MG tablet Take 10 mg by mouth 2 (two) times daily as needed for heartburn or indigestion.   FIBER SELECT GUMMIES PO Take 2 tablets by mouth at bedtime.   FOLIC ACID PO Take 1 tablet by mouth daily.   ibuprofen (ADVIL,MOTRIN) 200 MG tablet Take 400 mg by mouth every 8 (eight) hours as needed.    methocarbamol (ROBAXIN) 500 MG tablet TAKE 1 TABLET BY MOUTH EVERY 8 HOURS AS NEEDED FOR MUSCLE SPASMS.   methotrexate (RHEUMATREX) 2.5 MG tablet Take 20 mg by mouth once a week.   metoprolol succinate (TOPROL-XL) 25 MG 24 hr tablet    Multiple Vitamin (MULTIVITAMIN WITH MINERALS) TABS tablet Take 1 tablet by mouth at bedtime.   OXYGEN Inhale into the lungs at bedtime. 2.5 - 3.0 L   Polyethyl Glycol-Propyl Glycol (SYSTANE) 0.4-0.3 % SOLN Apply to eye.   PREDNISONE PO Take 5 mg by mouth daily with breakfast.   vitamin B-12 (CYANOCOBALAMIN) 500 MCG tablet Take 500 mcg by mouth daily.   diclofenac sodium (VOLTAREN) 1 % GEL Apply  2 g topically 3 (three) times daily as needed. (Patient not taking: Reported on 02/10/2023)   erythromycin ophthalmic ointment SMARTSIG:In Eye(s) (Patient not taking: Reported on 02/10/2023)   fish oil-omega-3 fatty acids 1000 MG capsule Take 1 g by mouth once a week. (Patient not taking: Reported on 02/10/2023)   fluticasone (FLONASE) 50 MCG/ACT nasal spray Place 2 sprays into both nostrils daily as needed. (Patient not taking: Reported on 02/10/2023)   neomycin-polymyxin-dexameth (MAXITROL) 0.1 % OINT Place 1 application  into both eyes See admin instructions. PRN: TWICE DAILY X1 WEEK & THEN DECREASE TO ONCE DAILY FOR X2 WEEKS  (Patient not taking: Reported on 02/10/2023)   No facility-administered encounter medications on file as of 02/10/2023.    Allergies (verified) Niacin and related and Meloxicam   History: Past Medical History:  Diagnosis Date   Allergic rhinitis    Allergy    Arthritis    Atrophic vaginitis    Breast cancer (HCC)    Right mastectomy   Diverticulosis    GERD (gastroesophageal reflux disease)    H/O: pneumonia    as child-viral pneumonia   Headache    History of shingles    HLD (hyperlipidemia)    pt denies   IBS (irritable bowel syndrome)    Implantable loop recorder present    OSA (obstructive sleep apnea) 06/20/2020   CPAP   Osteopenia    Sarcoidosis    Sliding hiatal hernia    Past Surgical History:  Procedure Laterality Date   APPENDECTOMY     BASAL CELL CARCINOMA EXCISION     scalp   BASAL CELL CARCINOMA EXCISION  08/2016   nose   CATARACT EXTRACTION W/PHACO Right 04/29/2022   Procedure: CATARACT EXTRACTION PHACO AND INTRAOCULAR LENS PLACEMENT (IOC) RIGHT VIVITY LENS 10.90 01:10.5;  Surgeon: Lockie Mola, MD;  Location: [facility];  Service: Ophthalmology;  Laterality: Right;  sleep apnea   CATARACT EXTRACTION W/PHACO Left 05/13/2022   Procedure: CATARACT EXTRACTION PHACO AND INTRAOCULAR LENS PLACEMENT (IOC) LEFT VIVITY LENS 8.84 01:10.1;  Surgeon: Lockie Mola, MD;  Location: [facility];  Service: Ophthalmology;  Laterality: Left;  sleep apnea   COLONOSCOPY  02/2010   divertics, rpt 5 years   DEXA  2005   osteopenia, no change from 2003   DILATION AND CURETTAGE OF UTERUS  10/1999   LAPAROSCOPIC TOTAL HYSTERECTOMY  05/2002   for fibroids/polyp   MASTECTOMY Right 02/2009   right breast cancer   MOHS SURGERY  04/17/2022   Nose   POLYPECTOMY     TOTAL ABDOMINAL HYSTERECTOMY  2003   for fibroids/polyps   VIDEO BRONCHOSCOPY WITH ENDOBRONCHIAL NAVIGATION N/A 02/01/2017   Procedure: VIDEO BRONCHOSCOPY WITH ENDOBRONCHIAL NAVIGATION;   Surgeon: Loreli Slot, MD;  Location: [facility];  Service: Thoracic;  Laterality: N/A;   Family History  Problem Relation Age of Onset   Prostate cancer Father    Prostate cancer Brother    Lymphoma Brother    Heart attack Paternal Grandmother    Stomach cancer Maternal Grandfather    Colon cancer Neg Hx    Rectal cancer Neg Hx    Esophageal cancer Neg Hx    Social History   Socioeconomic History   Marital status: Married    Spouse name: Not on file   Number of children: 2   Years of education: Not on file   Highest education level: Bachelor's degree (e.g., BA, AB, BS)  Occupational History   Occupation: Retired  Tobacco Use  Smoking status: Former    Packs/day: 1.00    Years: 3.00    Additional pack years: 0.00    Total pack years: 3.00    Types: Cigarettes    Start date: 81    Quit date: 08/31/1968    Years since quitting: 54.4   Smokeless tobacco: Never  Vaping Use   Vaping Use: Never used  Substance and Sexual Activity   Alcohol use: Not Currently    Alcohol/week: 0.0 standard drinks of alcohol   Drug use: No   Sexual activity: Yes  Other Topics Concern   Not on file  Social History Narrative   Married      2 sons      Retired      2 cups coffee/daily      Gym 3-4 times/week   Social Determinants of Health   Financial Resource Strain: Low Risk  (02/10/2023)   Overall Financial Resource Strain (CARDIA)    Difficulty of Paying Living Expenses: Not hard at all  Food Insecurity: No Food Insecurity (02/10/2023)   Hunger Vital Sign    Worried About Running Out of Food in the Last Year: Never true    Ran Out of Food in the Last Year: Never true  Transportation Needs: No Transportation Needs (02/10/2023)   PRAPARE - Administrator, Civil Service (Medical): No    Lack of Transportation (Non-Medical): No  Physical Activity: Inactive (02/10/2023)   Exercise Vital Sign    Days of Exercise per Week: 0 days    Minutes of Exercise per Session:  0 min  Stress: No Stress Concern Present (02/10/2023)   [employer] of Occupational Health - Occupational Stress Questionnaire    Feeling of Stress : Not at all  Social Connections: Moderately Integrated (02/10/2023)   Social Connection and Isolation Panel [NHANES]    Frequency of Communication with Friends and Family: More than three times a week    Frequency of Social Gatherings with Friends and Family: More than three times a week    Attends Religious Services: Never    Database administrator or Organizations: Yes    Attends Engineer, structural: More than 4 times per year    Marital Status: Married  Recent Concern: Social Connections - Moderately Isolated (11/29/2022)   Social Connection and Isolation Panel [NHANES]    Frequency of Communication with Friends and Family: Once a week    Frequency of Social Gatherings with Friends and Family: Once a week    Attends Religious Services: Never    Database administrator or Organizations: Yes    Attends Engineer, structural: More than 4 times per year    Marital Status: Married    Tobacco Counseling Counseling given: Not Answered   Clinical Intake:  Pre-visit preparation completed: Yes  Pain : No/denies pain     Nutritional Risks: Nausea/ vomitting/ diarrhea (loose stools caused by meds) Diabetes: No  How often do you need to have someone help you when you read instructions, pamphlets, or other written materials from your doctor or pharmacy?: 1 - Never  Diabetic? no  Interpreter Needed?: No  Information entered by :: C.Zaakirah Kistner LPN   Activities of Daily Living    02/10/2023    1:46 PM 02/09/2023    5:51 PM  In your present state of health, do you have any difficulty performing the following activities:  Hearing? 0 0  Vision? 0 0  Difficulty concentrating or making decisions? 0 0  Walking or climbing stairs? 0 1  Dressing or bathing? 0 0  Doing errands, shopping? 0 0  Preparing Food and eating ?  N N  Using the Toilet? N N  In the past six months, have you accidently leaked urine? Y Y  Comment occasionally   Do you have problems with loss of bowel control? N   Managing your Medications? N N  Managing your Finances? N N  Housekeeping or managing your Housekeeping? N N    Patient Care Team: [place], Audrie Gallus, MD as PCP - General Roseanne Kaufman, Angelena Sole, MD as Consulting Physician (Dermatology)  Indicate any recent Medical Services you may have received from other than [facility] providers in the past year (date may be approximate).     Assessment:   This is a routine wellness examination for Judith.  Hearing/Vision screen Hearing Screening - Comments:: No hearing issues Vision Screening - Comments:: Readers - [facility]  Dietary issues and exercise activities discussed: Current Exercise Habits: The patient does not participate in regular exercise at present, Exercise limited by: orthopedic condition(s) (spinal stenosis)   Goals Addressed             This Visit's Progress    Patient Stated       Improve health enough some medications.       Depression Screen    02/10/2023    1:45 PM 02/23/2022    2:24 PM 02/20/2022    3:07 PM 02/19/2021    9:09 AM 01/19/2020   10:35 AM 01/18/2019    9:42 AM 12/24/2017    9:54 AM  PHQ 2/9 Scores  PHQ - 2 Score 0 0 0 0 0 0 0  PHQ- 9 Score    3 0 0 0    Fall Risk    02/10/2023    1:46 PM 02/09/2023    5:51 PM 11/30/2022   11:12 AM 02/23/2022    2:24 PM 02/20/2022    3:07 PM  Fall Risk   Falls in the past year? 0 0 0 0 0  Number falls in past yr: 0  0 0 0  Injury with Fall? 0  0 0 0  Risk for fall due to : No Fall Risks  No Fall Risks No Fall Risks Medication side effect  Follow up Falls prevention discussed;Falls evaluation completed  Falls evaluation completed Falls evaluation completed Falls evaluation completed;Education provided;Falls prevention discussed    FALL RISK PREVENTION PERTAINING TO THE HOME:  Any stairs in or around  the home? Yes  If so, are there any without handrails? No  Home free of loose throw rugs in walkways, pet beds, electrical cords, etc? Yes  Adequate lighting in your home to reduce risk of falls? Yes   ASSISTIVE DEVICES UTILIZED TO PREVENT FALLS:  Life alert? No  Use of a cane, walker or w/c? No  Grab bars in the bathroom? No  Shower chair or bench in shower? No  Elevated toilet seat or a handicapped toilet? Yes    Cognitive Function:    01/19/2020   10:40 AM 01/18/2019    9:28 AM 12/24/2017    9:54 AM 12/23/2016   10:21 AM 12/12/2015    9:46 AM  MMSE - Mini Mental State Exam  Orientation to time 5 5 5 5 5   Orientation to Place 5 5 5 5 5   Registration 3 3 3 3 3   Attention/ Calculation 5 0 0 0 0  Recall 3 3 3 3 3   Language-  name 2 objects  0 0 0 0  Language- repeat 1 1 1 1 1   Language- follow 3 step command  0 3 3 3   Language- read & follow direction  0 0 0 0  Write a sentence  0 0 0 0  Copy design  0 0 0 0  Total score  17 20 20 20         02/10/2023    1:47 PM 02/20/2022    3:09 PM  6CIT Screen  What Year? 0 points 0 points  What month? 0 points 0 points  What time? 0 points 0 points  Count back from 20 0 points 0 points  Months in reverse 0 points 0 points  Repeat phrase 0 points 0 points  Total Score 0 points 0 points    Immunizations Immunization History  Administered Date(s) Administered   Fluad Quad(high Dose 65+) 06/20/2019, 05/29/2020, 06/11/2022   Influenza Split 06/24/2011, 06/08/2012   Influenza Whole 07/08/2007, 06/11/2008, 06/19/2009, 06/04/2010   Influenza,inj,Quad PF,6+ Mos 06/15/2013, 06/21/2014, 07/31/2015, 06/10/2016, 06/15/2017, 05/25/2018   Influenza,inj,quad, With Preservative 06/29/2017   PFIZER Comirnaty(Gray Top)Covid-19 Tri-Sucrose Vaccine 02/04/2021   PFIZER(Purple Top)SARS-COV-2 Vaccination 10/05/2019, 10/30/2019, 04/30/2020   Pneumococcal Conjugate-13 12/05/2014   Pneumococcal Polysaccharide-23 03/20/2011   Td 06/06/2001   Tdap  03/20/2011    TDAP status: Due, Education has been provided regarding the importance of this vaccine. Advised may receive this vaccine at local pharmacy or Health Dept. Aware to provide a copy of the vaccination record if obtained from local pharmacy or Health Dept. Verbalized acceptance and understanding.  Flu Vaccine status: Up to date  Pneumococcal vaccine status: Up to date  Covid-19 vaccine status: Information provided on how to obtain vaccines.   Qualifies for Shingles Vaccine? Yes   Zostavax completed No   Shingrix Completed?: No.    Education has been provided regarding the importance of this vaccine. Patient has been advised to call insurance company to determine out of pocket expense if they have not yet received this vaccine. Advised may also receive vaccine at local pharmacy or Health Dept. Verbalized acceptance and understanding.  Screening Tests Health Maintenance  Topic Date Due   Zoster Vaccines- Shingrix (1 of 2) Never done   DTaP/Tdap/Td (3 - Td or Tdap) 03/19/2021   COVID-19 Vaccine (5 - 2023-24 season) 05/01/2022   INFLUENZA VACCINE  04/01/2023   MAMMOGRAM  07/03/2023   Medicare Annual Wellness (AWV)  02/10/2024   Pneumonia Vaccine 41+ Years old  Completed   DEXA SCAN  Completed   Hepatitis C Screening  Completed   HPV VACCINES  Aged Out   Colonoscopy  Discontinued    Health Maintenance  Health Maintenance Due  Topic Date Due   Zoster Vaccines- Shingrix (1 of 2) Never done   DTaP/Tdap/Td (3 - Td or Tdap) 03/19/2021   COVID-19 Vaccine (5 - 2023-24 season) 05/01/2022    Colorectal cancer screening: No longer required.   Mammogram status: Completed 07/02/22. Repeat every year  Bone Density status: Completed 04/14/21. Results reflect: Bone density results: OSTEOPENIA. Repeat every 2 years.  Lung Cancer Screening: (Low Dose CT Chest recommended if Age 23-80 years, 30 pack-year currently smoking OR have quit w/in 15years.) does not qualify.   Lung Cancer  Screening Referral: no  Additional Screening:  Hepatitis C Screening: does qualify; Completed 01/18/19  Vision Screening: Recommended annual ophthalmology exams for early detection of glaucoma and other disorders of the eye. Is the patient up to date with their annual eye exam?  Yes  Who is the provider or what is the name of the office in which the patient attends annual eye exams? [facility] If pt is not established with a provider, would they like to be referred to a provider to establish care? Yes .   Dental Screening: Recommended annual dental exams for proper oral hygiene  Community Resource Referral / Chronic Care Management: CRR required this visit?  No   CCM required this visit?  No      Plan:     I have personally reviewed and noted the following in the patient's chart:   Medical and social history Use of alcohol, tobacco or illicit drugs  Current medications and supplements including opioid prescriptions. Patient is not currently taking opioid prescriptions. Functional ability and status Nutritional status Physical activity Advanced directives List of other physicians Hospitalizations, surgeries, and ER visits in previous 12 months Vitals Screenings to include cognitive, depression, and falls Referrals and appointments  In addition, I have reviewed and discussed with patient certain preventive protocols, quality metrics, and best practice recommendations. A written personalized care plan for preventive services as well as general preventive health recommendations were provided to patient.     Maryan Puls, LPN   [id number]/61/0960   Nurse Notes: Dexa scan order placed.

## 2023-02-10 NOTE — Patient Instructions (Signed)
Judith [name] , Thank you for taking time to come for your Medicare Wellness Visit. I appreciate your ongoing commitment to your health goals. Please review the following plan we discussed and let me know if I can assist you in the future.   These are the goals we discussed:  Goals      Patient Stated     Starting 01/18/19, I will continue to take medications as prescribed.      Patient Stated     01/19/2020, I will continue to walk my dog 1-2 miles everyday.      Patient Stated     02/20/2022, wants to be able to walk and see     Patient Stated     Improve health enough some medications.        This is a list of the screening recommended for you and due dates:  Health Maintenance  Topic Date Due   Zoster (Shingles) Vaccine (1 of 2) Never done   DTaP/Tdap/Td vaccine (3 - Td or Tdap) 03/19/2021   COVID-19 Vaccine (5 - 2023-24 season) 05/01/2022   Flu Shot  04/01/2023   Mammogram  07/03/2023   Medicare Annual Wellness Visit  02/10/2024   Pneumonia Vaccine  Completed   DEXA scan (bone density measurement)  Completed   Hepatitis C Screening  Completed   HPV Vaccine  Aged Out   Colon Cancer Screening  Discontinued    Advanced directives: Please bring a copy of your health care power of attorney and living will to the office to be added to your chart at your convenience.   Conditions/risks identified: Aim for 30 minutes of exercise or brisk walking, 6-8 glasses of water, and 5 servings of fruits and vegetables each day.   Next appointment: Follow up in one year for your annual wellness visit 02/14/24 @ 1pm telephone   Preventive Care 65 Years and Older, Female Preventive care refers to lifestyle choices and visits with your health care provider that can promote health and wellness. What does preventive care include? A yearly physical exam. This is also called an annual well check. Dental exams once or twice a year. Routine eye exams. Ask your health care provider how often you  should have your eyes checked. Personal lifestyle choices, including: Daily care of your teeth and gums. Regular physical activity. Eating a healthy diet. Avoiding tobacco and drug use. Limiting alcohol use. Practicing safe sex. Taking low-dose aspirin every day. Taking vitamin and mineral supplements as recommended by your health care provider. What happens during an annual well check? The services and screenings done by your health care provider during your annual well check will depend on your age, overall health, lifestyle risk factors, and family history of disease. Counseling  Your health care provider may ask you questions about your: Alcohol use. Tobacco use. Drug use. Emotional well-being. Home and relationship well-being. Sexual activity. Eating habits. History of falls. Memory and ability to understand (cognition). Work and work Astronomer. Reproductive health. Screening  You may have the following tests or measurements: Height, weight, and BMI. Blood pressure. Lipid and cholesterol levels. These may be checked every 5 years, or more frequently if you are over 79 years old. Skin check. Lung cancer screening. You may have this screening every year starting at age 59 if you have a 30-pack-year history of smoking and currently smoke or have quit within the past 15 years. Fecal occult blood test (FOBT) of the stool. You may have this test every year starting  at age 95. Flexible sigmoidoscopy or colonoscopy. You may have a sigmoidoscopy every 5 years or a colonoscopy every 10 years starting at age 52. Hepatitis C blood test. Hepatitis B blood test. Sexually transmitted disease (STD) testing. Diabetes screening. This is done by checking your blood sugar (glucose) after you have not eaten for a while (fasting). You may have this done every 1-3 years. Bone density scan. This is done to screen for osteoporosis. You may have this done starting at age 5. Mammogram. This may  be done every 1-2 years. Talk to your health care provider about how often you should have regular mammograms. Talk with your health care provider about your test results, treatment options, and if necessary, the need for more tests. Vaccines  Your health care provider may recommend certain vaccines, such as: Influenza vaccine. This is recommended every year. Tetanus, diphtheria, and acellular pertussis (Tdap, Td) vaccine. You may need a Td booster every 10 years. Zoster vaccine. You may need this after age 6. Pneumococcal 13-valent conjugate (PCV13) vaccine. One dose is recommended after age 42. Pneumococcal polysaccharide (PPSV23) vaccine. One dose is recommended after age 67. Talk to your health care provider about which screenings and vaccines you need and how often you need them. This information is not intended to replace advice given to you by your health care provider. Make sure you discuss any questions you have with your health care provider. Document Released: 09/13/2015 Document Revised: 05/06/2016 Document Reviewed: 06/18/2015 Elsevier Interactive Patient Education  2017 [employer].  Fall Prevention in the Home Falls can cause injuries. They can happen to people of all ages. There are many things you can do to make your home safe and to help prevent falls. What can I do on the outside of my home? Regularly fix the edges of walkways and driveways and fix any cracks. Remove anything that might make you trip as you walk through a door, such as a raised step or threshold. Trim any bushes or trees on the path to your home. Use bright outdoor lighting. Clear any walking paths of anything that might make someone trip, such as rocks or tools. Regularly check to see if handrails are loose or broken. Make sure that both sides of any steps have handrails. Any raised decks and porches should have guardrails on the edges. Have any leaves, snow, or ice cleared regularly. Use sand or salt  on walking paths during winter. Clean up any spills in your garage right away. This includes oil or grease spills. What can I do in the bathroom? Use night lights. Install grab bars by the toilet and in the tub and shower. Do not use towel bars as grab bars. Use non-skid mats or decals in the tub or shower. If you need to sit down in the shower, use a plastic, non-slip stool. Keep the floor dry. Clean up any water that spills on the floor as soon as it happens. Remove soap buildup in the tub or shower regularly. Attach bath mats securely with double-sided non-slip rug tape. Do not have throw rugs and other things on the floor that can make you trip. What can I do in the bedroom? Use night lights. Make sure that you have a light by your bed that is easy to reach. Do not use any sheets or blankets that are too big for your bed. They should not hang down onto the floor. Have a firm chair that has side arms. You can use this for support  while you get dressed. Do not have throw rugs and other things on the floor that can make you trip. What can I do in the kitchen? Clean up any spills right away. Avoid walking on wet floors. Keep items that you use a lot in easy-to-reach places. If you need to reach something above you, use a strong step stool that has a grab bar. Keep electrical cords out of the way. Do not use floor polish or wax that makes floors slippery. If you must use wax, use non-skid floor wax. Do not have throw rugs and other things on the floor that can make you trip. What can I do with my stairs? Do not leave any items on the stairs. Make sure that there are handrails on both sides of the stairs and use them. Fix handrails that are broken or loose. Make sure that handrails are as long as the stairways. Check any carpeting to make sure that it is firmly attached to the stairs. Fix any carpet that is loose or worn. Avoid having throw rugs at the top or bottom of the stairs. If you  do have throw rugs, attach them to the floor with carpet tape. Make sure that you have a light switch at the top of the stairs and the bottom of the stairs. If you do not have them, ask someone to add them for you. What else can I do to help prevent falls? Wear shoes that: Do not have high heels. Have rubber bottoms. Are comfortable and fit you well. Are closed at the toe. Do not wear sandals. If you use a stepladder: Make sure that it is fully opened. Do not climb a closed stepladder. Make sure that both sides of the stepladder are locked into place. Ask someone to hold it for you, if possible. Clearly mark and make sure that you can see: Any grab bars or handrails. First and last steps. Where the edge of each step is. Use tools that help you move around (mobility aids) if they are needed. These include: Canes. Walkers. Scooters. Crutches. Turn on the lights when you go into a dark area. Replace any light bulbs as soon as they burn out. Set up your furniture so you have a clear path. Avoid moving your furniture around. If any of your floors are uneven, fix them. If there are any pets around you, be aware of where they are. Review your medicines with your doctor. Some medicines can make you feel dizzy. This can increase your chance of falling. Ask your doctor what other things that you can do to help prevent falls. This information is not intended to replace advice given to you by your health care provider. Make sure you discuss any questions you have with your health care provider. Document Released: 06/13/2009 Document Revised: 01/23/2016 Document Reviewed: 09/21/2014 Elsevier Interactive Patient Education  2017 [employer].

## 2023-02-15 DIAGNOSIS — C50911 Malignant neoplasm of unspecified site of right female breast: Secondary | ICD-10-CM | POA: Diagnosis not present

## 2023-02-17 ENCOUNTER — Telehealth: Payer: Self-pay | Admitting: Family Medicine

## 2023-02-17 DIAGNOSIS — R000 Tachycardia, unspecified: Secondary | ICD-10-CM

## 2023-02-17 DIAGNOSIS — Z0000 Encounter for general adult medical examination without abnormal findings: Secondary | ICD-10-CM

## 2023-02-17 DIAGNOSIS — D869 Sarcoidosis, unspecified: Secondary | ICD-10-CM

## 2023-02-17 DIAGNOSIS — E7800 Pure hypercholesterolemia, unspecified: Secondary | ICD-10-CM

## 2023-02-17 NOTE — Telephone Encounter (Signed)
-----   Message from Alvina Chou sent at 02/08/2023 10:06 AM EDT ----- Regarding: Lab orders for Thursday 6.20.21 Patient is scheduled for CPX labs, please order future labs, Thanks , Camelia Eng

## 2023-02-18 ENCOUNTER — Other Ambulatory Visit (INDEPENDENT_AMBULATORY_CARE_PROVIDER_SITE_OTHER): Payer: Medicare HMO

## 2023-02-18 DIAGNOSIS — E7800 Pure hypercholesterolemia, unspecified: Secondary | ICD-10-CM

## 2023-02-18 DIAGNOSIS — D869 Sarcoidosis, unspecified: Secondary | ICD-10-CM

## 2023-02-18 DIAGNOSIS — R000 Tachycardia, unspecified: Secondary | ICD-10-CM | POA: Diagnosis not present

## 2023-02-18 LAB — COMPREHENSIVE METABOLIC PANEL
ALT: 25 U/L (ref 0–35)
AST: 19 U/L (ref 0–37)
Albumin: 4 g/dL (ref 3.5–5.2)
Alkaline Phosphatase: 39 U/L (ref 39–117)
BUN: 23 mg/dL (ref 6–23)
CO2: 26 mEq/L (ref 19–32)
Calcium: 9 mg/dL (ref 8.4–10.5)
Chloride: 105 mEq/L (ref 96–112)
Creatinine, Ser: 0.96 mg/dL (ref 0.40–1.20)
GFR: 56.88 mL/min — ABNORMAL LOW (ref >60.00)
Glucose, Bld: 84 mg/dL (ref 70–99)
Potassium: 4.5 mEq/L (ref 3.5–5.1)
Sodium: 140 mEq/L (ref 135–145)
Total Bilirubin: 0.7 mg/dL (ref 0.2–1.2)
Total Protein: 6.4 g/dL (ref 6.0–8.3)

## 2023-02-18 LAB — LIPID PANEL
Cholesterol: 196 mg/dL (ref 0–200)
HDL: 60.9 mg/dL (ref >39.00)
LDL Cholesterol: 114 mg/dL — ABNORMAL HIGH (ref 0–99)
NonHDL: 135.57
Total CHOL/HDL Ratio: 3
Triglycerides: 107 mg/dL (ref 0.0–149.0)
VLDL: 21.4 mg/dL (ref 0.0–40.0)

## 2023-02-18 LAB — CBC WITH DIFFERENTIAL/PLATELET
Basophils Absolute: 0 10*3/uL (ref 0.0–0.1)
Basophils Relative: 0.4 % (ref 0.0–3.0)
Eosinophils Absolute: 0 10*3/uL (ref 0.0–0.7)
Eosinophils Relative: 0.2 % (ref 0.0–5.0)
HCT: 43.3 % (ref 36.0–46.0)
Hemoglobin: 14.1 g/dL (ref 12.0–15.0)
Lymphocytes Relative: 12.2 % (ref 12.0–46.0)
Lymphs Abs: 1.4 10*3/uL (ref 0.7–4.0)
MCHC: 32.6 g/dL (ref 30.0–36.0)
MCV: 103.3 fl — ABNORMAL HIGH (ref 78.0–100.0)
Monocytes Absolute: 1.1 10*3/uL — ABNORMAL HIGH (ref 0.1–1.0)
Monocytes Relative: 9.4 % (ref 3.0–12.0)
Neutro Abs: 9.3 10*3/uL — ABNORMAL HIGH (ref 1.4–7.7)
Neutrophils Relative %: 77.8 % — ABNORMAL HIGH (ref 43.0–77.0)
Platelets: 229 10*3/uL (ref 150.0–400.0)
RBC: 4.19 Mil/uL (ref 3.87–5.11)
RDW: 14.9 % (ref 11.5–15.5)
WBC: 11.9 10*3/uL — ABNORMAL HIGH (ref 4.0–10.5)

## 2023-02-18 LAB — TSH: TSH: 2.62 u[IU]/mL (ref 0.35–5.50)

## 2023-02-24 ENCOUNTER — Telehealth: Payer: Self-pay

## 2023-02-24 NOTE — Telephone Encounter (Signed)
Spoke to patient in regards to below message. She stated that she had a walk test in her home. I suggested that she contact a supervisor in the billing department in reference to this matter.  She also mentioned that the second night of the sleep study, her cpap mask was leaking badly. She does not feel that study was accurate that night.   Dr. Belia Heman, please advise.

## 2023-02-24 NOTE — Telephone Encounter (Addendum)
Spoke to patient and clarified that Dr. Belia Heman did NOT order HST or SMW. She stated that she contacted [facility] and was told that [facility] performed test. She was advised to contact [facility]. Pt has requested that I contact [facility].   Spoke to [place] with [facility]. She stated that she spoke with her manager and this issue with be addressed with Perlie Gold and patient will not be billed for HST or Six minute walk. ONO will be performed.   Oxygen order that was received by Fayrene Fearing today was based off of sleep study not ONO.   Nothing further needed.

## 2023-02-24 NOTE — Telephone Encounter (Signed)
Judith [name] said that [employer] [facility] was the company who performed the test, not [facility]. Did Dr. Belia Heman order from them? Please call PT to clarify. [facility] can not help her.    My internet search- [facility], [address], [place], [place]

## 2023-02-24 NOTE — Telephone Encounter (Signed)
Received written orders from Fayrene Fearing with [facility] for oxygen. He stated that home sleep study, ONO on cpap and six minute walk test were performed.  I have researched patient's chart. Dr. Belia Heman ordered ONO only. Six minute walk test or home sleep study was NOT ordered by our office.  I spoke to Fayrene Fearing with [facility] in reference to this. He stated that the ONO order was forwarded to the incorrect department. It was sent to the sleep study department. He stated that that six minute walk test are included in the home sleep test orders. I'm concerned how these test were performed without an order. I asked Fayrene Fearing if patient would be charged for these test and he stated that she should not be charged and he has already spoken to the sleep study department in regards to that.  Order placed in Dr. Clovis Fredrickson folder for review.

## 2023-02-24 NOTE — Telephone Encounter (Signed)
Judith [name] with [facility] called back and stated that patient would not be billed for SMW or HST. She also stated that HST could be used as ONO to qualify patient for O2. I explained patient's concern with her mask leaking the second night of the test. Judith [name] will push the ONO order thru to be completed.

## 2023-02-25 ENCOUNTER — Ambulatory Visit (INDEPENDENT_AMBULATORY_CARE_PROVIDER_SITE_OTHER): Payer: Medicare HMO | Admitting: Family Medicine

## 2023-02-25 ENCOUNTER — Encounter: Payer: Self-pay | Admitting: Family Medicine

## 2023-02-25 VITALS — BP 112/64 | HR 63 | Temp 97.4°F | Ht 59.5 in | Wt 162.4 lb

## 2023-02-25 DIAGNOSIS — M4807 Spinal stenosis, lumbosacral region: Secondary | ICD-10-CM | POA: Diagnosis not present

## 2023-02-25 DIAGNOSIS — Z0000 Encounter for general adult medical examination without abnormal findings: Secondary | ICD-10-CM | POA: Diagnosis not present

## 2023-02-25 DIAGNOSIS — R195 Other fecal abnormalities: Secondary | ICD-10-CM | POA: Diagnosis not present

## 2023-02-25 DIAGNOSIS — E2839 Other primary ovarian failure: Secondary | ICD-10-CM | POA: Diagnosis not present

## 2023-02-25 DIAGNOSIS — M8589 Other specified disorders of bone density and structure, multiple sites: Secondary | ICD-10-CM

## 2023-02-25 DIAGNOSIS — G4733 Obstructive sleep apnea (adult) (pediatric): Secondary | ICD-10-CM | POA: Diagnosis not present

## 2023-02-25 DIAGNOSIS — D869 Sarcoidosis, unspecified: Secondary | ICD-10-CM | POA: Diagnosis not present

## 2023-02-25 DIAGNOSIS — E7800 Pure hypercholesterolemia, unspecified: Secondary | ICD-10-CM

## 2023-02-25 DIAGNOSIS — M9981 Other biomechanical lesions of cervical region: Secondary | ICD-10-CM | POA: Diagnosis not present

## 2023-02-25 DIAGNOSIS — J9611 Chronic respiratory failure with hypoxia: Secondary | ICD-10-CM

## 2023-02-25 DIAGNOSIS — M4316 Spondylolisthesis, lumbar region: Secondary | ICD-10-CM

## 2023-02-25 DIAGNOSIS — M5126 Other intervertebral disc displacement, lumbar region: Secondary | ICD-10-CM | POA: Diagnosis not present

## 2023-02-25 DIAGNOSIS — M48061 Spinal stenosis, lumbar region without neurogenic claudication: Secondary | ICD-10-CM | POA: Diagnosis not present

## 2023-02-25 DIAGNOSIS — M1288 Other specific arthropathies, not elsewhere classified, other specified site: Secondary | ICD-10-CM | POA: Diagnosis not present

## 2023-02-25 NOTE — Assessment & Plan Note (Signed)
Dexa ordered

## 2023-02-25 NOTE — Progress Notes (Signed)
Subjective:    Patient ID: Judith [name], female    DOB: 05/08/1946, 77 y.o.   MRN: [id number]  HPI  Here for health maintenance exam and to review chronic medical problems   Wt Readings from Last 3 Encounters:  02/25/23 162 lb 6 oz (73.7 kg)  02/10/23 161 lb (73 kg)  02/09/23 164 lb (74.4 kg)   32.25 kg/m  Vitals:   02/25/23 1041  BP: 112/64  Pulse: 63  Temp: (!) 97.4 F (36.3 C)  SpO2: 96%    Spinal stenosis - has MRI of back today at [facility] Has had shots  Will likely need surgery   Had her heart fixed at [facility] (cardiac sarcoid)   Pulmonary sarcoid  Breathing is stable  02 level gets lower at home  Uses 02 at night (also cpap)   Immunization History  Administered Date(s) Administered   Fluad Quad(high Dose 65+) 06/20/2019, 05/29/2020, 06/11/2022   Influenza Split 06/24/2011, 06/08/2012   Influenza Whole 07/08/2007, 06/11/2008, 06/19/2009, 06/04/2010   Influenza,inj,Quad PF,6+ Mos 06/15/2013, 06/21/2014, 07/31/2015, 06/10/2016, 06/15/2017, 05/25/2018   Influenza,inj,quad, With Preservative 06/29/2017   PFIZER Comirnaty(Gray Top)Covid-19 Tri-Sucrose Vaccine 02/04/2021   PFIZER(Purple Top)SARS-COV-2 Vaccination 10/05/2019, 10/30/2019, 04/30/2020   Pneumococcal Conjugate-13 12/05/2014   Pneumococcal Polysaccharide-23 03/20/2011   Td 06/06/2001   Tdap 03/20/2011    Health Maintenance Due  Topic Date Due   Zoster Vaccines- Shingrix (1 of 2) Never done   DTaP/Tdap/Td (3 - Td or Tdap) 03/19/2021   Shingrix- pulmonary doctor said ok   Tetanus shot - needs / can check with pharmacy   Mammogram 07/2022 Personal history of breast cancer / doing well  Self breast exam: no lumps   Gyn care/ pap  Had a hysterectomy   Colon cancer screening - colonoscopy 07/2015  no recall due to age  Had positive cologuard - has not followed up with that  Last PET scan was clear - has another one planned  Waiting to get off some medicines for the sarcoid   Watching  some suspicous cells around pancreas - that is watched    Dexa  03/2021  osteopenia  Falls-none Fractures-none  Supplements -vit D Exercise - goes for walks when she can but recently her back/legs are problematic  Very limited with breathing also  Resistance training bothers her back    Mood    02/10/2023    1:45 PM 02/23/2022    2:24 PM 02/20/2022    3:07 PM 02/19/2021    9:09 AM 01/19/2020   10:35 AM  Depression screen PHQ 2/9  Decreased Interest 0 0 0 0 0  Down, Depressed, Hopeless 0 0 0 0 0  PHQ - 2 Score 0 0 0 0 0  Altered sleeping    1 0  Tired, decreased energy    2 0  Change in appetite    0 0  Feeling bad or failure about yourself     0 0  Trouble concentrating    0 0  Moving slowly or fidgety/restless    0 0  Suicidal thoughts    0 0  PHQ-9 Score    3 0  Difficult doing work/chores    Not difficult at all Not difficult at all    Hyperlipidemia Lab Results  Component Value Date   CHOL 196 02/18/2023   CHOL 199 02/16/2022   CHOL 204 (H) 02/12/2021   Lab Results  Component Value Date   HDL 60.90 02/18/2023   HDL 53.30 02/16/2022  HDL 62.90 02/12/2021   Lab Results  Component Value Date   LDLCALC 114 (H) 02/18/2023   LDLCALC 110 (H) 02/16/2022   LDLCALC 110 (H) 02/12/2021   Lab Results  Component Value Date   TRIG 107.0 02/18/2023   TRIG 179.0 (H) 02/16/2022   TRIG 156.0 (H) 02/12/2021   Lab Results  Component Value Date   CHOLHDL 3 02/18/2023   CHOLHDL 4 02/16/2022   CHOLHDL 3 02/12/2021   Lab Results  Component Value Date   LDLDIRECT 154.0 03/13/2011   LDLDIRECT 134.4 12/18/2008   LDLDIRECT 148.2 11/17/2007   Fairly stable  Diet controlled  Avoids fatty foods  Red meat once weekly    Sarcoid  Takes methotrexate and prednisone  Lab Results  Component Value Date   WBC 11.9 (H) 02/18/2023   HGB 14.1 02/18/2023   HCT 43.3 02/18/2023   MCV 103.3 (H) 02/18/2023   PLT 229.0 02/18/2023     Other labs Lab Results  Component Value  Date   NA 140 02/18/2023   K 4.5 02/18/2023   CO2 26 02/18/2023   GLUCOSE 84 02/18/2023   BUN 23 02/18/2023   CREATININE 0.96 02/18/2023   CALCIUM 9.0 02/18/2023   GFR 56.88 (L) 02/18/2023   GFRNONAA >60 06/20/2020   Needs more water  Lab Results  Component Value Date   ALT 25 02/18/2023   AST 19 02/18/2023   ALKPHOS 39 02/18/2023   BILITOT 0.7 02/18/2023   Lab Results  Component Value Date   TSH 2.62 02/18/2023      Patient Active Problem List   Diagnosis Date Noted   Left foot pain 11/20/2022   Positive colorectal cancer screening using Cologuard test 02/23/2022   Chronic respiratory failure with hypoxia (HCC) 06/20/2020   OSA (obstructive sleep apnea) 06/20/2020   Unknown status of immunity to COVID-19 virus 03/18/2020   Degeneration of thoracic intervertebral disc 06/17/2018   Encounter for screening mammogram for breast cancer 01/07/2018   Dyspnea 10/25/2017   Sinus tachycardia 10/25/2017   Multiple lung nodules on CT 10/23/2017   Spondylolisthesis, lumbar region 10/14/2017   Routine general medical examination at a health care facility 01/06/2016   IBS (irritable colon syndrome) 05/31/2015   Colon polyps 05/31/2015   Estrogen deficiency 12/05/2014   Personal history of other malignant neoplasm of skin 09/21/2013   Sarcoidosis 08/09/2012   Mixed incontinence urge and stress 03/16/2012   ALOPECIA 11/14/2010   GERD 01/24/2009   NEOPLASM, MALIGNANT, BREAST, HX OF 01/02/2009   Hyperlipidemia 11/17/2007   Osteopenia 11/17/2007   TINNITUS 09/07/2007   ALLERGIC RHINITIS 09/07/2007   DIVERTICULOSIS, COLON 09/07/2007   Fatty liver 09/07/2007   History of diverticulitis 08/12/2007   Past Medical History:  Diagnosis Date   Allergic rhinitis    Allergy    Arthritis    Atrophic vaginitis    Breast cancer (HCC)    Right mastectomy   Cataract 2020   Diverticulosis    GERD (gastroesophageal reflux disease)    H/O: pneumonia    as child-viral pneumonia    Headache    History of shingles    HLD (hyperlipidemia)    pt denies   IBS (irritable bowel syndrome)    Implantable loop recorder present    OSA (obstructive sleep apnea) 06/20/2020   CPAP   Osteopenia    Oxygen deficiency at night   Sarcoidosis    Sleep apnea 2018 use CPAP   Sliding hiatal hernia    Past Surgical History:  Procedure  Laterality Date   APPENDECTOMY     BASAL CELL CARCINOMA EXCISION     scalp   BASAL CELL CARCINOMA EXCISION  08/2016   nose   BREAST SURGERY     CATARACT EXTRACTION W/PHACO Right 04/29/2022   Procedure: CATARACT EXTRACTION PHACO AND INTRAOCULAR LENS PLACEMENT (IOC) RIGHT VIVITY LENS 10.90 01:10.5;  Surgeon: Lockie Mola, MD;  Location: [facility];  Service: Ophthalmology;  Laterality: Right;  sleep apnea   CATARACT EXTRACTION W/PHACO Left 05/13/2022   Procedure: CATARACT EXTRACTION PHACO AND INTRAOCULAR LENS PLACEMENT (IOC) LEFT VIVITY LENS 8.84 01:10.1;  Surgeon: Lockie Mola, MD;  Location: [facility];  Service: Ophthalmology;  Laterality: Left;  sleep apnea   COLONOSCOPY  02/2010   divertics, rpt 5 years   DEXA  2005   osteopenia, no change from 2003   DILATION AND CURETTAGE OF UTERUS  10/1999   EYE SURGERY  lasik   LAPAROSCOPIC TOTAL HYSTERECTOMY  05/2002   for fibroids/polyp   MASTECTOMY Right 02/2009   right breast cancer   MOHS SURGERY  04/17/2022   Nose   POLYPECTOMY     TOTAL ABDOMINAL HYSTERECTOMY  2003   for fibroids/polyps   TUBAL LIGATION  1977   VIDEO BRONCHOSCOPY WITH ENDOBRONCHIAL NAVIGATION N/A 02/01/2017   Procedure: VIDEO BRONCHOSCOPY WITH ENDOBRONCHIAL NAVIGATION;  Surgeon: Loreli Slot, MD;  Location: [facility] OR;  Service: Thoracic;  Laterality: N/A;   Social History   Tobacco Use   Smoking status: Former    Packs/day: 0.50    Years: 5.00    Additional pack years: 0.00    Total pack years: 2.50    Types: Cigarettes    Start date: 64    Quit date: 08/31/1968    Years since  quitting: 54.5   Smokeless tobacco: Never  Vaping Use   Vaping Use: Never used  Substance Use Topics   Alcohol use: Not Currently    Comment: occasional glass of wine   Drug use: No   Family History  Problem Relation Age of Onset   Prostate cancer Father    Prostate cancer Brother    Lymphoma Brother    Heart attack Paternal Grandmother    Stomach cancer Maternal Grandfather    Colon cancer Neg Hx    Rectal cancer Neg Hx    Esophageal cancer Neg Hx    Allergies  Allergen Reactions   Niacin And Related Other (See Comments)    Caused black and blue marks all over body   Meloxicam Rash   Current Outpatient Medications on File Prior to Visit  Medication Sig Dispense Refill   acetaminophen (TYLENOL) 500 MG tablet Take 500 mg by mouth every 6 (six) hours as needed.     Adalimumab 40 MG/0.4ML PNKT Inject into the skin.     Biotin 5 MG TABS Take by mouth daily.     calcium-vitamin D (OSCAL WITH D) 500-200 MG-UNIT per tablet Take 1 tablet by mouth once a week.      cholecalciferol (VITAMIN D3) 25 MCG (1000 UNIT) tablet Take 1,000 Units by mouth daily.     cyclobenzaprine (FLEXERIL) 10 MG tablet Take 1 tablet (10 mg total) by mouth 3 (three) times daily as needed for muscle spasms (severe back spasm). Do not take with methocarbamol 30 tablet 1   erythromycin ophthalmic ointment 1 Application daily as needed.     famotidine (PEPCID) 10 MG tablet Take 10 mg by mouth 2 (two) times daily as needed for heartburn or indigestion.  FIBER SELECT GUMMIES PO Take 2 tablets by mouth at bedtime.     fish oil-omega-3 fatty acids 1000 MG capsule Take 1 g by mouth once a week.     FOLIC ACID PO Take 1 tablet by mouth daily.     methocarbamol (ROBAXIN) 500 MG tablet TAKE 1 TABLET BY MOUTH EVERY 8 HOURS AS NEEDED FOR MUSCLE SPASMS. 90 tablet 5   methotrexate (RHEUMATREX) 2.5 MG tablet Take 20 mg by mouth once a week.     metoprolol succinate (TOPROL-XL) 25 MG 24 hr tablet      MIEBO 1.338 GM/ML SOLN  Apply 1 drop to eye 2 (two) times daily.     Multiple Vitamin (MULTIVITAMIN WITH MINERALS) TABS tablet Take 1 tablet by mouth at bedtime.     OXYGEN Inhale into the lungs at bedtime. 2.5 - 3.0 L     Polyethyl Glycol-Propyl Glycol (SYSTANE) 0.4-0.3 % SOLN Apply to eye daily as needed.     PREDNISONE PO Take 5 mg by mouth daily with breakfast.     vitamin B-12 (CYANOCOBALAMIN) 500 MCG tablet Take 500 mcg by mouth 2 (two) times a week.     No current facility-administered medications on file prior to visit.    Review of Systems  Constitutional:  Positive for fatigue. Negative for activity change, appetite change, fever and unexpected weight change.  HENT:  Negative for congestion, ear pain, rhinorrhea, sinus pressure and sore throat.   Eyes:  Negative for pain, redness and visual disturbance.  Respiratory:  Positive for cough and shortness of breath. Negative for wheezing.   Cardiovascular:  Negative for chest pain, palpitations and leg swelling.  Gastrointestinal:  Negative for abdominal pain, blood in stool, constipation and diarrhea.  Endocrine: Negative for polydipsia and polyuria.  Genitourinary:  Negative for dysuria, frequency and urgency.  Musculoskeletal:  Negative for arthralgias, back pain and myalgias.  Skin:  Negative for pallor and rash.  Allergic/Immunologic: Negative for environmental allergies.  Neurological:  Negative for dizziness, syncope and headaches.  Hematological:  Negative for adenopathy. Does not bruise/bleed easily.  Psychiatric/Behavioral:  Negative for decreased concentration and dysphoric mood. The patient is not nervous/anxious.        Objective:   Physical Exam Constitutional:      General: She is not in acute distress.    Appearance: Normal appearance. She is well-developed. She is obese. She is not ill-appearing or diaphoretic.     Comments: Facial changes from prednisone noted   HENT:     Head: Normocephalic and atraumatic.     Right Ear: Tympanic  membrane, ear canal and external ear normal.     Left Ear: Tympanic membrane, ear canal and external ear normal.     Nose: Nose normal. No congestion.     Mouth/Throat:     Mouth: Mucous membranes are moist.     Pharynx: Oropharynx is clear. No posterior oropharyngeal erythema.  Eyes:     General: No scleral icterus.    Extraocular Movements: Extraocular movements intact.     Conjunctiva/sclera: Conjunctivae normal.     Pupils: Pupils are equal, round, and reactive to light.  Neck:     Thyroid: No thyromegaly.     Vascular: No carotid bruit or JVD.  Cardiovascular:     Rate and Rhythm: Normal rate and regular rhythm.     Pulses: Normal pulses.     Heart sounds: Normal heart sounds.     No gallop.  Pulmonary:     Effort: Pulmonary  effort is normal. No respiratory distress.     Breath sounds: Normal breath sounds. No wheezing.     Comments: Good air exch Chest:     Chest wall: No tenderness.  Abdominal:     General: Bowel sounds are normal. There is no distension or abdominal bruit.     Palpations: Abdomen is soft. There is no mass.     Tenderness: There is no abdominal tenderness.     Hernia: No hernia is present.  Genitourinary:    Comments: Breast exam: No mass, nodules, thickening, tenderness, bulging, retraction, inflamation, nipple discharge or skin changes noted.  No axillary or clavicular LA.     Musculoskeletal:        General: No tenderness. Normal range of motion.     Cervical back: Normal range of motion and neck supple. No rigidity. No muscular tenderness.     Right lower leg: No edema.     Left lower leg: No edema.     Comments: borderline kyphosis   Lymphadenopathy:     Cervical: No cervical adenopathy.  Skin:    General: Skin is warm and dry.     Coloration: Skin is not pale.     Findings: No erythema or rash.     Comments: Solar lentigines diffusely Some senile purpura on arms   Neurological:     Mental Status: She is alert. Mental status is at baseline.      Cranial Nerves: No cranial nerve deficit.     Motor: No abnormal muscle tone.     Coordination: Coordination normal.     Gait: Gait normal.     Deep Tendon Reflexes: Reflexes are normal and symmetric.  Psychiatric:        Mood and Affect: Mood normal.        Cognition and Memory: Cognition and memory normal.           Assessment & Plan:   Problem List Items Addressed This Visit       Respiratory   OSA (obstructive sleep apnea)    Continues cpap and pulmonary care      Chronic respiratory failure with hypoxia (HCC)    From sarcoid Also OSA Using cpap and 01 at night         Musculoskeletal and Integument   Spondylolisthesis, lumbar region    Pending MRI  May need surgery      Osteopenia    dexa reviewed 03/2021 New one ordered for aug  No falls or fx Taking vit D Could use more exercise but pulmonary problems prohibit it  Due for dexa 04/2023         Other   Sarcoidosis    Pulmonary and cardiac Continues specialist treatment  Methotrexate and prednisone for no      Routine general medical examination at a health care facility - Primary    Reviewed health habits including diet and exercise and skin cancer prevention Reviewed appropriate screening tests for age  Also reviewed health mt list, fam hx and immunization status , as well as social and family history   See HPI Labs reviewed and ordered Pt plans to get shingrix at pharmacy / was told by pulmonary safe  Will also look into tetanus shot  Mammogram utd 07/2022 Colonoscopy 2016 with no recall due to age - but then had positive cologuard, she is not yet interested in GI appt to discuss/ plans to do this later given other pressing medical issues (of note PET scan was  clear and that is very reassuring) Dexa ordered for August, no falls or fracture Encouraged compliance with vit D  Exercise is limited Phq is 0       Positive colorectal cancer screening using Cologuard test    Not ready to see  GI in light of other medical problems Will call when ready       Hyperlipidemia    Disc goals for lipids and reasons to control them Rev last labs with pt Rev low sat fat diet in detail  LDL is stable HDL is up Exercise is limited        Estrogen deficiency    Dexa ordered      Relevant Orders   DG Bone Density

## 2023-02-25 NOTE — Assessment & Plan Note (Signed)
From sarcoid Also OSA Using cpap and 01 at night

## 2023-02-25 NOTE — Assessment & Plan Note (Signed)
Reviewed health habits including diet and exercise and skin cancer prevention Reviewed appropriate screening tests for age  Also reviewed health mt list, fam hx and immunization status , as well as social and family history   See HPI Labs reviewed and ordered Pt plans to get shingrix at pharmacy / was told by pulmonary safe  Will also look into tetanus shot  Mammogram utd 07/2022 Colonoscopy 2016 with no recall due to age - but then had positive cologuard, she is not yet interested in GI appt to discuss/ plans to do this later given other pressing medical issues (of note PET scan was clear and that is very reassuring) Dexa ordered for August, no falls or fracture Encouraged compliance with vit D  Exercise is limited Phq is 0

## 2023-02-25 NOTE — Assessment & Plan Note (Signed)
Pending MRI  May need surgery

## 2023-02-25 NOTE — Patient Instructions (Addendum)
If you are interested in the new shingles vaccine (Shingrix) - call your local pharmacy to check on coverage and availability  If affordable, get on a wait list at your pharmacy to get the vaccine.  You are also due for a tetanus   Let us know if and when you want to see GI for the cologuard   Take your calcium and D daily  Make sure you get at least 2000 international units of D3 daily   Avoid red meat/ fried foods/ egg yolks/ fatty breakfast meats/ butter, cheese and high fat dairy/ and shellfish  Limit beef to once per month   Work on water intake for kidney health     You have an order for:  []   2D Mammogram  []   3D Mammogram  [x]   Bone Density     Please call for appointment:   []   [facility]  [address] [place] [address]  [phone]  []   [facility])   [address]  [place], [place] [address]  [phone]  []   [facility]      [address] [place], [place]        [phone]         []   [facility]  [address] [place], [place]  [phone]  [x]  [facility]   [address]   [place], [place] [address]  [phone]  []  [facility]  [address] [place], [place] [address] [phone]    Make sure to wear two piece clothing  No lotions powders or deodorants the day of the appointment Make sure to bring picture ID and insurance card.  Bring list of medications you are currently taking including any supplements.   Schedule your screening mammogram through MyChart!   Select [place] imaging sites can now be scheduled through MyChart.  Log into your MyChart account.  Go to 'Visit' (or 'Appointments' if  on mobile App) --> Schedule an  Appointment  Under 'Select a Reason for  Visit' choose the Mammogram  Screening option.  Complete the pre-visit questions  and select the time and place that  best fits your schedule

## 2023-02-25 NOTE — Assessment & Plan Note (Signed)
Disc goals for lipids and reasons to control them Rev last labs with pt Rev low sat fat diet in detail  LDL is stable HDL is up Exercise is limited

## 2023-02-25 NOTE — Assessment & Plan Note (Signed)
Continues cpap and pulmonary care

## 2023-02-25 NOTE — Assessment & Plan Note (Signed)
Not ready to see GI in light of other medical problems Will call when ready

## 2023-02-25 NOTE — Assessment & Plan Note (Signed)
Pulmonary and cardiac Continues specialist treatment  Methotrexate and prednisone for no

## 2023-02-25 NOTE — Assessment & Plan Note (Signed)
dexa reviewed 03/2021 New one ordered for aug  No falls or fx Taking vit D Could use more exercise but pulmonary problems prohibit it  Due for dexa 04/2023

## 2023-03-03 DIAGNOSIS — J9601 Acute respiratory failure with hypoxia: Secondary | ICD-10-CM | POA: Diagnosis not present

## 2023-03-04 DIAGNOSIS — D869 Sarcoidosis, unspecified: Secondary | ICD-10-CM | POA: Diagnosis not present

## 2023-03-10 DIAGNOSIS — M47815 Spondylosis without myelopathy or radiculopathy, thoracolumbar region: Secondary | ICD-10-CM | POA: Diagnosis not present

## 2023-03-10 DIAGNOSIS — M450 Ankylosing spondylitis of multiple sites in spine: Secondary | ICD-10-CM | POA: Diagnosis not present

## 2023-03-10 DIAGNOSIS — Z7952 Long term (current) use of systemic steroids: Secondary | ICD-10-CM | POA: Diagnosis not present

## 2023-03-10 DIAGNOSIS — Z79899 Other long term (current) drug therapy: Secondary | ICD-10-CM | POA: Diagnosis not present

## 2023-03-10 DIAGNOSIS — Z87891 Personal history of nicotine dependence: Secondary | ICD-10-CM | POA: Diagnosis not present

## 2023-03-10 DIAGNOSIS — M48061 Spinal stenosis, lumbar region without neurogenic claudication: Secondary | ICD-10-CM | POA: Diagnosis not present

## 2023-03-10 DIAGNOSIS — M4807 Spinal stenosis, lumbosacral region: Secondary | ICD-10-CM | POA: Diagnosis not present

## 2023-03-10 DIAGNOSIS — M4316 Spondylolisthesis, lumbar region: Secondary | ICD-10-CM | POA: Diagnosis not present

## 2023-03-10 DIAGNOSIS — D8685 Sarcoid myocarditis: Secondary | ICD-10-CM | POA: Diagnosis not present

## 2023-03-11 ENCOUNTER — Telehealth: Payer: Self-pay

## 2023-03-11 NOTE — Telephone Encounter (Signed)
ONO reviewed by Dr. Belia Heman - Low SpO2 82%. Recommend 2L [place] at night.  I notified the patient. She will go down from 2.5L to 2L.   Nothing further needed.

## 2023-03-12 ENCOUNTER — Other Ambulatory Visit: Payer: Self-pay | Admitting: Family Medicine

## 2023-03-12 DIAGNOSIS — Z1231 Encounter for screening mammogram for malignant neoplasm of breast: Secondary | ICD-10-CM

## 2023-03-17 ENCOUNTER — Other Ambulatory Visit: Payer: Self-pay | Admitting: Family Medicine

## 2023-03-17 NOTE — Telephone Encounter (Signed)
I refilled this and took flexeril off her list

## 2023-03-17 NOTE — Telephone Encounter (Signed)
Last filled 02/23/22 #90 tab/ 5 refills, last CPE was 02/25/23

## 2023-03-19 ENCOUNTER — Encounter: Payer: Self-pay | Admitting: Internal Medicine

## 2023-03-25 DIAGNOSIS — D2271 Melanocytic nevi of right lower limb, including hip: Secondary | ICD-10-CM | POA: Diagnosis not present

## 2023-03-25 DIAGNOSIS — L821 Other seborrheic keratosis: Secondary | ICD-10-CM | POA: Diagnosis not present

## 2023-03-25 DIAGNOSIS — D485 Neoplasm of uncertain behavior of skin: Secondary | ICD-10-CM | POA: Diagnosis not present

## 2023-03-25 DIAGNOSIS — D2272 Melanocytic nevi of left lower limb, including hip: Secondary | ICD-10-CM | POA: Diagnosis not present

## 2023-03-25 DIAGNOSIS — D225 Melanocytic nevi of trunk: Secondary | ICD-10-CM | POA: Diagnosis not present

## 2023-03-25 DIAGNOSIS — D0462 Carcinoma in situ of skin of left upper limb, including shoulder: Secondary | ICD-10-CM | POA: Diagnosis not present

## 2023-03-25 DIAGNOSIS — D2261 Melanocytic nevi of right upper limb, including shoulder: Secondary | ICD-10-CM | POA: Diagnosis not present

## 2023-03-25 DIAGNOSIS — D2262 Melanocytic nevi of left upper limb, including shoulder: Secondary | ICD-10-CM | POA: Diagnosis not present

## 2023-03-25 DIAGNOSIS — Z09 Encounter for follow-up examination after completed treatment for conditions other than malignant neoplasm: Secondary | ICD-10-CM | POA: Diagnosis not present

## 2023-03-25 DIAGNOSIS — Z08 Encounter for follow-up examination after completed treatment for malignant neoplasm: Secondary | ICD-10-CM | POA: Diagnosis not present

## 2023-03-25 DIAGNOSIS — C44519 Basal cell carcinoma of skin of other part of trunk: Secondary | ICD-10-CM | POA: Diagnosis not present

## 2023-03-25 DIAGNOSIS — Z85820 Personal history of malignant melanoma of skin: Secondary | ICD-10-CM | POA: Diagnosis not present

## 2023-03-25 DIAGNOSIS — Z85828 Personal history of other malignant neoplasm of skin: Secondary | ICD-10-CM | POA: Diagnosis not present

## 2023-03-26 ENCOUNTER — Other Ambulatory Visit: Payer: Self-pay | Admitting: Family Medicine

## 2023-03-26 NOTE — Telephone Encounter (Signed)
Patient called in regarding the denial that she got for methocarbamol.She said that this is the medication that she is wanting to be on ,she said that Dr Milinda Antis is aware that she switches between the two medications sometimes,because the methocarbamol works better for her back,but flexeril is what she usually takes.

## 2023-03-29 ENCOUNTER — Encounter: Payer: Self-pay | Admitting: Family Medicine

## 2023-03-29 MED ORDER — CYCLOBENZAPRINE HCL 10 MG PO TABS: 5.0000 mg | ORAL_TABLET | Freq: Three times a day (TID) | ORAL | 2 refills | Status: DC | PRN

## 2023-03-29 NOTE — Addendum Note (Signed)
Addended by: Roxy Manns A on: 03/29/2023 07:13 PM   Modules accepted: Orders

## 2023-03-29 NOTE — Telephone Encounter (Signed)
Patient sent a mychart message and stated she needs the flexeril refilled.

## 2023-03-30 DIAGNOSIS — K219 Gastro-esophageal reflux disease without esophagitis: Secondary | ICD-10-CM | POA: Diagnosis not present

## 2023-03-30 DIAGNOSIS — D869 Sarcoidosis, unspecified: Secondary | ICD-10-CM | POA: Diagnosis not present

## 2023-03-30 DIAGNOSIS — Z01818 Encounter for other preprocedural examination: Secondary | ICD-10-CM | POA: Diagnosis not present

## 2023-03-30 DIAGNOSIS — R918 Other nonspecific abnormal finding of lung field: Secondary | ICD-10-CM | POA: Diagnosis not present

## 2023-03-30 DIAGNOSIS — J309 Allergic rhinitis, unspecified: Secondary | ICD-10-CM | POA: Diagnosis not present

## 2023-03-30 DIAGNOSIS — Z95818 Presence of other cardiac implants and grafts: Secondary | ICD-10-CM | POA: Diagnosis not present

## 2023-03-30 DIAGNOSIS — D8685 Sarcoid myocarditis: Secondary | ICD-10-CM | POA: Diagnosis not present

## 2023-03-30 DIAGNOSIS — M4316 Spondylolisthesis, lumbar region: Secondary | ICD-10-CM | POA: Diagnosis not present

## 2023-03-30 DIAGNOSIS — G4733 Obstructive sleep apnea (adult) (pediatric): Secondary | ICD-10-CM | POA: Diagnosis not present

## 2023-03-31 DIAGNOSIS — D869 Sarcoidosis, unspecified: Secondary | ICD-10-CM | POA: Diagnosis not present

## 2023-03-31 DIAGNOSIS — R0602 Shortness of breath: Secondary | ICD-10-CM | POA: Diagnosis not present

## 2023-03-31 DIAGNOSIS — Z95818 Presence of other cardiac implants and grafts: Secondary | ICD-10-CM | POA: Diagnosis not present

## 2023-03-31 DIAGNOSIS — D8685 Sarcoid myocarditis: Secondary | ICD-10-CM | POA: Diagnosis not present

## 2023-03-31 DIAGNOSIS — R000 Tachycardia, unspecified: Secondary | ICD-10-CM | POA: Diagnosis not present

## 2023-04-04 DIAGNOSIS — D869 Sarcoidosis, unspecified: Secondary | ICD-10-CM | POA: Diagnosis not present

## 2023-04-17 ENCOUNTER — Other Ambulatory Visit: Payer: Self-pay | Admitting: Family Medicine

## 2023-04-19 NOTE — Telephone Encounter (Signed)
Med d/c from med list due to flexeril being refilled

## 2023-04-20 DIAGNOSIS — G4733 Obstructive sleep apnea (adult) (pediatric): Secondary | ICD-10-CM | POA: Diagnosis not present

## 2023-04-29 DIAGNOSIS — E785 Hyperlipidemia, unspecified: Secondary | ICD-10-CM | POA: Diagnosis not present

## 2023-04-29 DIAGNOSIS — R0902 Hypoxemia: Secondary | ICD-10-CM | POA: Diagnosis not present

## 2023-04-29 DIAGNOSIS — Z981 Arthrodesis status: Secondary | ICD-10-CM | POA: Diagnosis not present

## 2023-04-29 DIAGNOSIS — Z7952 Long term (current) use of systemic steroids: Secondary | ICD-10-CM | POA: Diagnosis not present

## 2023-04-29 DIAGNOSIS — M48061 Spinal stenosis, lumbar region without neurogenic claudication: Secondary | ICD-10-CM | POA: Diagnosis not present

## 2023-04-29 DIAGNOSIS — M4316 Spondylolisthesis, lumbar region: Secondary | ICD-10-CM | POA: Diagnosis not present

## 2023-04-29 DIAGNOSIS — Z87891 Personal history of nicotine dependence: Secondary | ICD-10-CM | POA: Diagnosis not present

## 2023-04-29 DIAGNOSIS — Z853 Personal history of malignant neoplasm of breast: Secondary | ICD-10-CM | POA: Diagnosis not present

## 2023-04-29 DIAGNOSIS — R918 Other nonspecific abnormal finding of lung field: Secondary | ICD-10-CM | POA: Diagnosis not present

## 2023-04-29 DIAGNOSIS — G4733 Obstructive sleep apnea (adult) (pediatric): Secondary | ICD-10-CM | POA: Diagnosis not present

## 2023-04-29 DIAGNOSIS — Z85820 Personal history of malignant melanoma of skin: Secondary | ICD-10-CM | POA: Diagnosis not present

## 2023-04-29 DIAGNOSIS — M810 Age-related osteoporosis without current pathological fracture: Secondary | ICD-10-CM | POA: Diagnosis not present

## 2023-04-29 DIAGNOSIS — M4310 Spondylolisthesis, site unspecified: Secondary | ICD-10-CM | POA: Diagnosis not present

## 2023-04-29 DIAGNOSIS — Z7982 Long term (current) use of aspirin: Secondary | ICD-10-CM | POA: Diagnosis not present

## 2023-04-29 DIAGNOSIS — D860 Sarcoidosis of lung: Secondary | ICD-10-CM | POA: Diagnosis not present

## 2023-04-29 DIAGNOSIS — Z9011 Acquired absence of right breast and nipple: Secondary | ICD-10-CM | POA: Diagnosis not present

## 2023-04-29 DIAGNOSIS — I951 Orthostatic hypotension: Secondary | ICD-10-CM | POA: Diagnosis not present

## 2023-04-29 DIAGNOSIS — Z886 Allergy status to analgesic agent status: Secondary | ICD-10-CM | POA: Diagnosis not present

## 2023-04-29 DIAGNOSIS — M5135 Other intervertebral disc degeneration, thoracolumbar region: Secondary | ICD-10-CM | POA: Diagnosis not present

## 2023-04-29 DIAGNOSIS — J9611 Chronic respiratory failure with hypoxia: Secondary | ICD-10-CM | POA: Diagnosis not present

## 2023-04-29 DIAGNOSIS — K219 Gastro-esophageal reflux disease without esophagitis: Secondary | ICD-10-CM | POA: Diagnosis not present

## 2023-04-29 DIAGNOSIS — D8685 Sarcoid myocarditis: Secondary | ICD-10-CM | POA: Diagnosis not present

## 2023-05-04 ENCOUNTER — Telehealth: Payer: Self-pay

## 2023-05-04 DIAGNOSIS — M5135 Other intervertebral disc degeneration, thoracolumbar region: Secondary | ICD-10-CM | POA: Diagnosis not present

## 2023-05-04 DIAGNOSIS — I951 Orthostatic hypotension: Secondary | ICD-10-CM | POA: Diagnosis not present

## 2023-05-04 DIAGNOSIS — R0902 Hypoxemia: Secondary | ICD-10-CM | POA: Diagnosis not present

## 2023-05-04 DIAGNOSIS — Z981 Arthrodesis status: Secondary | ICD-10-CM | POA: Diagnosis not present

## 2023-05-04 NOTE — Patient Outreach (Signed)
  Care Coordination   05/04/2023 Name: Judith [name] MRN: [id number] DOB: 07-10-1946   Care Coordination Outreach Attempts:  Contact answering call states patient had surgery and is in the hospital.  HIPAA compliant message left with return call phone number.   Follow Up Plan:  Additional outreach attempts will be made to offer the patient care coordination information and services.   Encounter Outcome:  Pt. Request to Call Back   Care Coordination Interventions:  No, not indicated    George Ina [facility] Care Coordination [phone] direct line

## 2023-05-05 ENCOUNTER — Telehealth: Payer: Self-pay

## 2023-05-05 DIAGNOSIS — D869 Sarcoidosis, unspecified: Secondary | ICD-10-CM | POA: Diagnosis not present

## 2023-05-05 NOTE — Transitions of Care (Post Inpatient/ED Visit) (Signed)
   05/05/2023  Name: Judith [name] MRN: [id number] DOB: July 03, 1946  Today's TOC FU Call Status: Today's TOC FU Call Status:: Successful TOC FU Call Completed TOC FU Call Complete Date: 05/05/23 Patient's Name and Date of Birth confirmed.  Transition Care Management Follow-up Telephone Call Date of Discharge: 05/04/23 Discharge Facility: Other [facility]) Name of Other (Non[facility]) Discharge Facility: [facility] Type of Discharge: Inpatient Admission Primary Inpatient Discharge Diagnosis:: back surgery How have you been since you were released from the hospital?: Same Any questions or concerns?: No  Items Reviewed: Did you receive and understand the discharge instructions provided?: Yes Medications obtained,verified, and reconciled?: No (patient reports that [facility] called and reviewed all her medications and she has no questions or concerns.) Any new allergies since your discharge?: No Dietary orders reviewed?: No Do you have support at home?: Yes People in Home: spouse Name of Support/Comfort Primary Source: husband  Medications Reviewed Today: Medications Reviewed Today   Medications were not reviewed in this encounter   Medication not reviewed as patient reports that she just reviewed her medications with [facility].  Declines need for review. States that she has all her medications and is taking as prescribed.   Home Care and Equipment/Supplies: Were Home Health Services Ordered?: No Any new equipment or medical supplies ordered?: NA (Reports she came home with all equipment needed) Were you able to get the equipment/medical supplies?: Yes Do you have any questions related to the use of the equipment/supplies?: No  Functional Questionnaire: Do you need assistance with bathing/showering or dressing?: No Do you need assistance with meal preparation?: No Do you need assistance with eating?: No Do you have difficulty maintaining continence: No Do you need assistance with getting  out of bed/getting out of a chair/moving?: Yes Do you have difficulty managing or taking your medications?: No  Follow up appointments reviewed: PCP Follow-up appointment confirmed?: Yes Date of PCP follow-up appointment?: 05/14/23 Follow-up Provider: [facility] Follow-up appointment confirmed?: Yes Date of Specialist follow-up appointment?: 06/23/23 Follow-Up Specialty Provider:: surgeon Do you need transportation to your follow-up appointment?: No  SDOH Interventions Today    Flowsheet Row Most Recent Value  SDOH Interventions   Food Insecurity Interventions Intervention Not Indicated  Transportation Interventions Intervention Not Indicated       Lonia Chimera, RN, BSN, CEN [facility] Care Coordinator [phone]

## 2023-05-14 ENCOUNTER — Ambulatory Visit: Payer: Medicare HMO | Admitting: Family Medicine

## 2023-05-18 DIAGNOSIS — Z4802 Encounter for removal of sutures: Secondary | ICD-10-CM | POA: Diagnosis not present

## 2023-05-31 ENCOUNTER — Other Ambulatory Visit: Payer: Medicare HMO

## 2023-06-02 DIAGNOSIS — Z79899 Other long term (current) drug therapy: Secondary | ICD-10-CM | POA: Diagnosis not present

## 2023-06-02 DIAGNOSIS — D8685 Sarcoid myocarditis: Secondary | ICD-10-CM | POA: Diagnosis not present

## 2023-06-03 DIAGNOSIS — D235 Other benign neoplasm of skin of trunk: Secondary | ICD-10-CM | POA: Diagnosis not present

## 2023-06-03 DIAGNOSIS — C44519 Basal cell carcinoma of skin of other part of trunk: Secondary | ICD-10-CM | POA: Diagnosis not present

## 2023-06-03 DIAGNOSIS — D0462 Carcinoma in situ of skin of left upper limb, including shoulder: Secondary | ICD-10-CM | POA: Diagnosis not present

## 2023-06-04 DIAGNOSIS — D869 Sarcoidosis, unspecified: Secondary | ICD-10-CM | POA: Diagnosis not present

## 2023-06-23 DIAGNOSIS — M4316 Spondylolisthesis, lumbar region: Secondary | ICD-10-CM | POA: Diagnosis not present

## 2023-06-23 DIAGNOSIS — M4807 Spinal stenosis, lumbosacral region: Secondary | ICD-10-CM | POA: Diagnosis not present

## 2023-06-23 DIAGNOSIS — M4326 Fusion of spine, lumbar region: Secondary | ICD-10-CM | POA: Diagnosis not present

## 2023-06-23 DIAGNOSIS — M48061 Spinal stenosis, lumbar region without neurogenic claudication: Secondary | ICD-10-CM | POA: Diagnosis not present

## 2023-07-01 DIAGNOSIS — D869 Sarcoidosis, unspecified: Secondary | ICD-10-CM | POA: Diagnosis not present

## 2023-07-01 DIAGNOSIS — G4733 Obstructive sleep apnea (adult) (pediatric): Secondary | ICD-10-CM | POA: Diagnosis not present

## 2023-07-01 DIAGNOSIS — R0602 Shortness of breath: Secondary | ICD-10-CM | POA: Diagnosis not present

## 2023-07-01 DIAGNOSIS — Z95818 Presence of other cardiac implants and grafts: Secondary | ICD-10-CM | POA: Diagnosis not present

## 2023-07-01 DIAGNOSIS — D8685 Sarcoid myocarditis: Secondary | ICD-10-CM | POA: Diagnosis not present

## 2023-07-01 DIAGNOSIS — E785 Hyperlipidemia, unspecified: Secondary | ICD-10-CM | POA: Diagnosis not present

## 2023-07-01 DIAGNOSIS — R000 Tachycardia, unspecified: Secondary | ICD-10-CM | POA: Diagnosis not present

## 2023-07-05 ENCOUNTER — Ambulatory Visit
Admission: RE | Admit: 2023-07-05 | Discharge: 2023-07-05 | Disposition: A | Discharge status: Home / Self Care | Payer: Medicare HMO | Source: Ambulatory Visit | Attending: Family Medicine | Admitting: Family Medicine

## 2023-07-05 ENCOUNTER — Other Ambulatory Visit: Payer: Self-pay | Admitting: Family Medicine

## 2023-07-05 DIAGNOSIS — Z1231 Encounter for screening mammogram for malignant neoplasm of breast: Secondary | ICD-10-CM | POA: Insufficient documentation

## 2023-07-05 DIAGNOSIS — D869 Sarcoidosis, unspecified: Secondary | ICD-10-CM | POA: Diagnosis not present

## 2023-07-14 DIAGNOSIS — M48061 Spinal stenosis, lumbar region without neurogenic claudication: Secondary | ICD-10-CM | POA: Diagnosis not present

## 2023-07-14 DIAGNOSIS — D8685 Sarcoid myocarditis: Secondary | ICD-10-CM | POA: Diagnosis not present

## 2023-07-14 DIAGNOSIS — E785 Hyperlipidemia, unspecified: Secondary | ICD-10-CM | POA: Diagnosis not present

## 2023-07-14 DIAGNOSIS — Z79899 Other long term (current) drug therapy: Secondary | ICD-10-CM | POA: Diagnosis not present

## 2023-07-14 DIAGNOSIS — D869 Sarcoidosis, unspecified: Secondary | ICD-10-CM | POA: Diagnosis not present

## 2023-07-14 DIAGNOSIS — E669 Obesity, unspecified: Secondary | ICD-10-CM | POA: Diagnosis not present

## 2023-07-19 ENCOUNTER — Telehealth: Payer: Self-pay | Admitting: Family Medicine

## 2023-07-19 ENCOUNTER — Encounter: Payer: Self-pay | Admitting: Family Medicine

## 2023-07-19 DIAGNOSIS — E2839 Other primary ovarian failure: Secondary | ICD-10-CM

## 2023-07-19 NOTE — Telephone Encounter (Signed)
The order is in  She can call to schedule  Thanks

## 2023-07-19 NOTE — Telephone Encounter (Signed)
[facility] contacted the office regarding patient's DEXA order. Patient would like to have her scan done at [facility] office, but says they are unable to use the order because [facility] already has. Patient is requesting new order to be sent to [facility], please advise

## 2023-07-20 NOTE — Telephone Encounter (Signed)
Called patient to let know. They have called and set up appointment with patient.  No further action needed at this time.

## 2023-07-22 ENCOUNTER — Ambulatory Visit (INDEPENDENT_AMBULATORY_CARE_PROVIDER_SITE_OTHER)
Admission: RE | Admit: 2023-07-22 | Discharge: 2023-07-22 | Disposition: A | Discharge status: Home / Self Care | Payer: Medicare HMO | Source: Ambulatory Visit | Attending: Family Medicine | Admitting: Family Medicine

## 2023-07-22 DIAGNOSIS — E2839 Other primary ovarian failure: Secondary | ICD-10-CM

## 2023-07-22 DIAGNOSIS — G4733 Obstructive sleep apnea (adult) (pediatric): Secondary | ICD-10-CM | POA: Diagnosis not present

## 2023-07-26 ENCOUNTER — Encounter: Payer: Self-pay | Admitting: Family Medicine

## 2023-07-26 ENCOUNTER — Ambulatory Visit: Payer: Medicare HMO | Admitting: Family Medicine

## 2023-07-26 VITALS — BP 116/70 | HR 71 | Temp 97.9°F | Resp 16 | Ht 59.5 in | Wt 160.0 lb

## 2023-07-26 DIAGNOSIS — K219 Gastro-esophageal reflux disease without esophagitis: Secondary | ICD-10-CM

## 2023-07-26 DIAGNOSIS — R197 Diarrhea, unspecified: Secondary | ICD-10-CM | POA: Insufficient documentation

## 2023-07-26 DIAGNOSIS — K529 Noninfective gastroenteritis and colitis, unspecified: Secondary | ICD-10-CM | POA: Insufficient documentation

## 2023-07-26 DIAGNOSIS — R195 Other fecal abnormalities: Secondary | ICD-10-CM

## 2023-07-26 LAB — CBC WITH DIFFERENTIAL/PLATELET
Basophils Absolute: 0 10*3/uL (ref 0.0–0.1)
Basophils Relative: 0.7 % (ref 0.0–3.0)
Eosinophils Absolute: 0.1 10*3/uL (ref 0.0–0.7)
Eosinophils Relative: 2.2 % (ref 0.0–5.0)
HCT: 41.5 % (ref 36.0–46.0)
Hemoglobin: 13.8 g/dL (ref 12.0–15.0)
Lymphocytes Relative: 28.2 % (ref 12.0–46.0)
Lymphs Abs: 1.5 10*3/uL (ref 0.7–4.0)
MCHC: 33.3 g/dL (ref 30.0–36.0)
MCV: 100.6 fL — ABNORMAL HIGH (ref 78.0–100.0)
Monocytes Absolute: 0.6 10*3/uL (ref 0.1–1.0)
Monocytes Relative: 11.2 % (ref 3.0–12.0)
Neutro Abs: 3.1 10*3/uL (ref 1.4–7.7)
Neutrophils Relative %: 57.7 % (ref 43.0–77.0)
Platelets: 300 10*3/uL (ref 150.0–400.0)
RBC: 4.13 Mil/uL (ref 3.87–5.11)
RDW: 15.4 % (ref 11.5–15.5)
WBC: 5.4 10*3/uL (ref 4.0–10.5)

## 2023-07-26 LAB — COMPREHENSIVE METABOLIC PANEL
ALT: 26 U/L (ref 0–35)
AST: 28 U/L (ref 0–37)
Albumin: 4.1 g/dL (ref 3.5–5.2)
Alkaline Phosphatase: 77 U/L (ref 39–117)
BUN: 17 mg/dL (ref 6–23)
CO2: 22 meq/L (ref 19–32)
Calcium: 9.5 mg/dL (ref 8.4–10.5)
Chloride: 107 meq/L (ref 96–112)
Creatinine, Ser: 0.82 mg/dL (ref 0.40–1.20)
GFR: 68.51 mL/min (ref >60.00)
Glucose, Bld: 113 mg/dL — ABNORMAL HIGH (ref 70–99)
Potassium: 4.5 meq/L (ref 3.5–5.1)
Sodium: 142 meq/L (ref 135–145)
Total Bilirubin: 0.5 mg/dL (ref 0.2–1.2)
Total Protein: 6.6 g/dL (ref 6.0–8.3)

## 2023-07-26 LAB — TSH: TSH: 2.36 u[IU]/mL (ref 0.35–5.50)

## 2023-07-26 NOTE — Assessment & Plan Note (Signed)
Worsened lastely  Takes pepcid otd 10 mg  Due for GI appointment (here also for diarrhea)  Will refer to check in  Encouraged to watch diet for triggers

## 2023-07-26 NOTE — Patient Instructions (Addendum)
Labs today for diarrhea including stool tests   Try a fiber supplement daily (metamucil or citrucel or benefiber) This may bulk up stool and slow down diarrhea   I will place a referral to GI to discuss , diarrhea and the positive cologuard and the acid reflux   For osteopenia we may consider a trial of alendronate (fosamax) for 5 years to prevent broken bones  I want to figure out the diarrhea before we start anything    I put the referral in for GI Call tomorrow to schedule an appointment   [facility]  [phone]

## 2023-07-26 NOTE — Assessment & Plan Note (Signed)
Pt had difficult colonoscopy in past (tortuous and diverticulosis) Declined further eval in past Now with diarrhea (acute on chronic) will refer to GI

## 2023-07-26 NOTE — Progress Notes (Signed)
Subjective:    Patient ID: Judith [name], female    DOB: 01/24/1946, 77 y.o.   MRN: [id number]  HPI  Wt Readings from Last 3 Encounters:  07/26/23 160 lb (72.6 kg)  02/25/23 162 lb 6 oz (73.7 kg)  02/10/23 161 lb (73 kg)   31.78 kg/m  Vitals:   07/26/23 0959  BP: 116/70  Pulse: 71  Resp: 16  Temp: 97.9 F (36.6 C)  SpO2: 96%    Pt presents with c/o diarrhea (ongoing)  Whole life/ spells of diarrhea  Worse than usual since end of August  (had given her meds for constipation)  Had back surgery then (? Antibiotic)   3 attacks in am- pure liquid/urgency  Some cramping this am / this is rare  Sometimes later in the day   No blood in stool  No /n/v at all  No fever  Has had a lot of mucous in the stool   Nothing in middle of the night   Does not matter what she eats  Eats granola with rasins  Oatmeal   No fiber supplements recently    Per chart history of IBS History of diverticulosis   Sarcoidosis  Taking biologic medicines  Finally off prednisone  Methotrexate  Humira     Pancreas cyst being watched at [facility]   Cologuard positive 02/2021 - declined follow up since she can't have colonoscopy and she does not tolerate barium enema well  Colonoscopy 07/2015 -tortuous colon and coulg not advance beyond sigmoid  Colonoscopy 2011 had adenomas     Bone density Osteopenia 07/22/23  Lowest T score -2.2   No dental issues May need a root canal in future   Has GERD Does not think it is in good control    Patient Active Problem List   Diagnosis Date Noted   Diarrhea 07/26/2023   Left foot pain 11/20/2022   Positive colorectal cancer screening using Cologuard test 02/23/2022   Chronic respiratory failure with hypoxia (HCC) 06/20/2020   OSA (obstructive sleep apnea) 06/20/2020   Unknown status of immunity to COVID-19 virus 03/18/2020   Degeneration of thoracic intervertebral disc 06/17/2018   Encounter for screening mammogram for breast  cancer 01/07/2018   Dyspnea 10/25/2017   Sinus tachycardia 10/25/2017   Multiple lung nodules on CT 10/23/2017   Spondylolisthesis, lumbar region 10/14/2017   Routine general medical examination at a health care facility 01/06/2016   IBS (irritable colon syndrome) 05/31/2015   Colon polyps 05/31/2015   Estrogen deficiency 12/05/2014   Personal history of other malignant neoplasm of skin 09/21/2013   Sarcoidosis 08/09/2012   Mixed incontinence urge and stress 03/16/2012   Alopecia 11/14/2010   GERD 01/24/2009   NEOPLASM, MALIGNANT, BREAST, HX OF 01/02/2009   Hyperlipidemia 11/17/2007   Osteopenia 11/17/2007   Tinnitus 09/07/2007   Allergic rhinitis 09/07/2007   Diverticulosis of colon 09/07/2007   Fatty liver 09/07/2007   History of diverticulitis 08/12/2007   Past Medical History:  Diagnosis Date   Allergic rhinitis    Allergy    Arthritis    Atrophic vaginitis    Breast cancer (HCC)    Right mastectomy   Cataract 2020   Diverticulosis    GERD (gastroesophageal reflux disease)    H/O: pneumonia    as child-viral pneumonia   Headache    History of shingles    HLD (hyperlipidemia)    pt denies   IBS (irritable bowel syndrome)    Implantable loop recorder present  OSA (obstructive sleep apnea) 06/20/2020   CPAP   Osteopenia    Oxygen deficiency at night   Sarcoidosis    Sleep apnea 2018 use CPAP   Sliding hiatal hernia    Past Surgical History:  Procedure Laterality Date   APPENDECTOMY     BASAL CELL CARCINOMA EXCISION     scalp   BASAL CELL CARCINOMA EXCISION  08/2016   nose   BREAST SURGERY     CATARACT EXTRACTION W/PHACO Right 04/29/2022   Procedure: CATARACT EXTRACTION PHACO AND INTRAOCULAR LENS PLACEMENT (IOC) RIGHT VIVITY LENS 10.90 01:10.5;  Surgeon: Lockie Mola, MD;  Location: [facility];  Service: Ophthalmology;  Laterality: Right;  sleep apnea   CATARACT EXTRACTION W/PHACO Left 05/13/2022   Procedure: CATARACT EXTRACTION PHACO  AND INTRAOCULAR LENS PLACEMENT (IOC) LEFT VIVITY LENS 8.84 01:10.1;  Surgeon: Lockie Mola, MD;  Location: [facility];  Service: Ophthalmology;  Laterality: Left;  sleep apnea   COLONOSCOPY  02/2010   divertics, rpt 5 years   DEXA  2005   osteopenia, no change from 2003   DILATION AND CURETTAGE OF UTERUS  10/1999   EYE SURGERY  lasik   LAPAROSCOPIC TOTAL HYSTERECTOMY  05/2002   for fibroids/polyp   MASTECTOMY Right 02/2009   right breast cancer   MOHS SURGERY  04/17/2022   Nose   POLYPECTOMY     TOTAL ABDOMINAL HYSTERECTOMY  2003   for fibroids/polyps   TUBAL LIGATION  1977   VIDEO BRONCHOSCOPY WITH ENDOBRONCHIAL NAVIGATION N/A 02/01/2017   Procedure: VIDEO BRONCHOSCOPY WITH ENDOBRONCHIAL NAVIGATION;  Surgeon: Loreli Slot, MD;  Location: [facility] OR;  Service: Thoracic;  Laterality: N/A;   Social History   Tobacco Use   Smoking status: Former    Current packs/day: 0.00    Average packs/day: 0.5 packs/day for 5.0 years (2.5 ttl pk-yrs)    Types: Cigarettes    Start date: 66    Quit date: 08/31/1968    Years since quitting: 54.9   Smokeless tobacco: Never  Vaping Use   Vaping status: Never Used  Substance Use Topics   Alcohol use: Not Currently    Comment: occasional glass of wine   Drug use: No   Family History  Problem Relation Age of Onset   Prostate cancer Father    Prostate cancer Brother    Lymphoma Brother    Heart attack Paternal Grandmother    Stomach cancer Maternal Grandfather    Colon cancer Neg Hx    Rectal cancer Neg Hx    Esophageal cancer Neg Hx    Allergies  Allergen Reactions   Niacin And Related Other (See Comments)    Caused black and blue marks all over body   Meloxicam Rash   Current Outpatient Medications on File Prior to Visit  Medication Sig Dispense Refill   acetaminophen (TYLENOL) 500 MG tablet Take 500 mg by mouth every 6 (six) hours as needed.     Adalimumab 40 MG/0.4ML PNKT Inject into the skin.     Biotin 5  MG TABS Take by mouth daily.     calcium-vitamin D (OSCAL WITH D) 500-200 MG-UNIT per tablet Take 1 tablet by mouth once a week.      cholecalciferol (VITAMIN D3) 25 MCG (1000 UNIT) tablet Take 1,000 Units by mouth daily.     cyclobenzaprine (FLEXERIL) 10 MG tablet Take 0.5-1 tablets (5-10 mg total) by mouth 3 (three) times daily as needed for muscle spasms. 30 tablet 2   erythromycin ophthalmic  ointment 1 Application daily as needed.     famotidine (PEPCID) 10 MG tablet Take 10 mg by mouth 2 (two) times daily as needed for heartburn or indigestion.     FIBER SELECT GUMMIES PO Take 2 tablets by mouth at bedtime.     fish oil-omega-3 fatty acids 1000 MG capsule Take 1 g by mouth once a week.     FOLIC ACID PO Take 1 tablet by mouth daily.     methotrexate (RHEUMATREX) 2.5 MG tablet Take 20 mg by mouth once a week.     metoprolol succinate (TOPROL-XL) 25 MG 24 hr tablet      MIEBO 1.338 GM/ML SOLN Apply 1 drop to eye 2 (two) times daily.     Multiple Vitamin (MULTIVITAMIN WITH MINERALS) TABS tablet Take 1 tablet by mouth at bedtime.     OXYGEN Inhale into the lungs at bedtime. 2.5 - 3.0 L     Polyethyl Glycol-Propyl Glycol (SYSTANE) 0.4-0.3 % SOLN Apply to eye daily as needed.     vitamin B-12 (CYANOCOBALAMIN) 500 MCG tablet Take 500 mcg by mouth 2 (two) times a week.     No current facility-administered medications on file prior to visit.    Review of Systems  Constitutional:  Positive for fatigue. Negative for activity change, appetite change, fever and unexpected weight change.  HENT:  Negative for congestion, ear pain, rhinorrhea, sinus pressure and sore throat.   Eyes:  Negative for pain, redness and visual disturbance.  Respiratory:  Negative for cough, shortness of breath and wheezing.   Cardiovascular:  Negative for chest pain and palpitations.  Gastrointestinal:  Positive for diarrhea. Negative for abdominal pain, blood in stool, constipation, nausea and vomiting.       GERd  symptoms Diarrhea   Endocrine: Negative for polydipsia and polyuria.  Genitourinary:  Negative for dysuria, frequency and urgency.  Musculoskeletal:  Negative for arthralgias, back pain and myalgias.  Skin:  Negative for pallor and rash.  Allergic/Immunologic: Negative for environmental allergies.  Neurological:  Negative for dizziness, syncope and headaches.  Hematological:  Negative for adenopathy. Does not bruise/bleed easily.  Psychiatric/Behavioral:  Negative for decreased concentration and dysphoric mood. The patient is not nervous/anxious.        Objective:   Physical Exam Constitutional:      General: She is not in acute distress.    Appearance: Normal appearance. She is well-developed. She is obese. She is not ill-appearing or diaphoretic.  HENT:     Head: Normocephalic and atraumatic.     Mouth/Throat:     Mouth: Mucous membranes are moist.  Eyes:     General: No scleral icterus.    Conjunctiva/sclera: Conjunctivae normal.     Pupils: Pupils are equal, round, and reactive to light.  Cardiovascular:     Rate and Rhythm: Normal rate and regular rhythm.     Heart sounds: Normal heart sounds.  Pulmonary:     Effort: Pulmonary effort is normal. No respiratory distress.     Breath sounds: Normal breath sounds. No wheezing or rales.  Abdominal:     General: Bowel sounds are normal. There is no distension.     Palpations: Abdomen is soft. There is no mass.     Tenderness: There is no abdominal tenderness. There is no right CVA tenderness, left CVA tenderness, guarding or rebound.     Hernia: No hernia is present.  Musculoskeletal:     Cervical back: Normal range of motion and neck supple.  Lymphadenopathy:  Cervical: No cervical adenopathy.  Skin:    General: Skin is warm and dry.     Coloration: Skin is not jaundiced or pale.     Findings: No bruising or erythema.  Neurological:     Mental Status: She is alert.     Motor: No weakness.     Gait: Gait normal.   Psychiatric:        Mood and Affect: Mood normal.           Assessment & Plan:   Problem List Items Addressed This Visit       Digestive   GERD    Worsened lastely  Takes pepcid otd 10 mg  Due for GI appointment (here also for diarrhea)  Will refer to check in  Encouraged to watch diet for triggers         Other   Positive colorectal cancer screening using Cologuard test    Pt had difficult colonoscopy in past (tortuous and diverticulosis) Declined further eval in past Now with diarrhea (acute on chronic) will refer to GI       Diarrhea - Primary    Acute on chronic  Watery urgent stool/no blood Also GERD symptoms  History of IBS History of positive cologuard (declined colonoscopy since she is difficult to get one on with tortuous colon and tics)  Worse since aug (hosp for back sugery)   Encouraged fluids to stay hydrated  Lab today  Stool studies for c diff and other infx causes  Pt is immunocomp on med for sarcoid (methotrexate and humira)  GI referral Encouraged trial of fiber supplement to bulk her stool        Relevant Orders   Comprehensive metabolic panel   CBC with Differential/Platelet   TSH

## 2023-07-26 NOTE — Assessment & Plan Note (Signed)
Acute on chronic  Watery urgent stool/no blood Also GERD symptoms  History of IBS History of positive cologuard (declined colonoscopy since she is difficult to get one on with tortuous colon and tics)  Worse since aug (hosp for back sugery)   Encouraged fluids to stay hydrated  Lab today  Stool studies for c diff and other infx causes  Pt is immunocomp on med for sarcoid (methotrexate and humira)  GI referral Encouraged trial of fiber supplement to bulk her stool

## 2023-07-27 ENCOUNTER — Other Ambulatory Visit: Payer: Self-pay

## 2023-07-27 DIAGNOSIS — K219 Gastro-esophageal reflux disease without esophagitis: Secondary | ICD-10-CM | POA: Diagnosis not present

## 2023-07-27 DIAGNOSIS — R197 Diarrhea, unspecified: Secondary | ICD-10-CM

## 2023-07-28 ENCOUNTER — Encounter: Payer: Self-pay | Admitting: Family Medicine

## 2023-07-28 LAB — C. DIFFICILE GDH AND TOXIN A/B
GDH ANTIGEN: NOT DETECTED
MICRO NUMBER:: 15782027
SPECIMEN QUALITY:: ADEQUATE
TOXIN A AND B: NOT DETECTED

## 2023-07-29 LAB — GASTROINTESTINAL PATHOGEN PNL
CampyloBacter Group: NOT DETECTED
Norovirus GI/GII: NOT DETECTED
Rotavirus A: NOT DETECTED
Salmonella species: NOT DETECTED
Shiga Toxin 1: NOT DETECTED
Shiga Toxin 2: NOT DETECTED
Shigella Species: NOT DETECTED
Vibrio Group: NOT DETECTED
Yersinia enterocolitica: NOT DETECTED

## 2023-08-01 MED ORDER — AMOXICILLIN-POT CLAVULANATE ER 1000-62.5 MG PO TB12: 2.0000 | ORAL_TABLET | Freq: Two times a day (BID) | ORAL | 0 refills | Status: DC

## 2023-08-02 DIAGNOSIS — J40 Bronchitis, not specified as acute or chronic: Secondary | ICD-10-CM | POA: Diagnosis not present

## 2023-08-02 DIAGNOSIS — D860 Sarcoidosis of lung: Secondary | ICD-10-CM | POA: Diagnosis not present

## 2023-08-02 DIAGNOSIS — D869 Sarcoidosis, unspecified: Secondary | ICD-10-CM | POA: Diagnosis not present

## 2023-08-02 DIAGNOSIS — Z7722 Contact with and (suspected) exposure to environmental tobacco smoke (acute) (chronic): Secondary | ICD-10-CM | POA: Diagnosis not present

## 2023-08-02 DIAGNOSIS — R197 Diarrhea, unspecified: Secondary | ICD-10-CM | POA: Diagnosis not present

## 2023-08-02 DIAGNOSIS — R0602 Shortness of breath: Secondary | ICD-10-CM | POA: Diagnosis not present

## 2023-08-02 DIAGNOSIS — J219 Acute bronchiolitis, unspecified: Secondary | ICD-10-CM | POA: Diagnosis not present

## 2023-08-02 DIAGNOSIS — D863 Sarcoidosis of skin: Secondary | ICD-10-CM | POA: Diagnosis not present

## 2023-08-02 NOTE — Telephone Encounter (Signed)
Patient called regarding these symptoms, states she got a call from [facility] for a prescription. Patient wanted to know what the rx was for? She was told it was an antibiotic, states she still has chills and diarrhea. But wanted to know what the antibiotic was for?

## 2023-08-04 ENCOUNTER — Ambulatory Visit: Payer: Medicare HMO | Admitting: Internal Medicine

## 2023-08-04 DIAGNOSIS — D869 Sarcoidosis, unspecified: Secondary | ICD-10-CM | POA: Diagnosis not present

## 2023-08-11 DIAGNOSIS — M47816 Spondylosis without myelopathy or radiculopathy, lumbar region: Secondary | ICD-10-CM | POA: Diagnosis not present

## 2023-08-11 DIAGNOSIS — M4326 Fusion of spine, lumbar region: Secondary | ICD-10-CM | POA: Diagnosis not present

## 2023-08-11 DIAGNOSIS — M4807 Spinal stenosis, lumbosacral region: Secondary | ICD-10-CM | POA: Diagnosis not present

## 2023-08-11 DIAGNOSIS — M4316 Spondylolisthesis, lumbar region: Secondary | ICD-10-CM | POA: Diagnosis not present

## 2023-08-11 DIAGNOSIS — Z981 Arthrodesis status: Secondary | ICD-10-CM | POA: Diagnosis not present

## 2023-08-11 DIAGNOSIS — M48061 Spinal stenosis, lumbar region without neurogenic claudication: Secondary | ICD-10-CM | POA: Diagnosis not present

## 2023-08-23 ENCOUNTER — Ambulatory Visit: Payer: Self-pay | Admitting: Family Medicine

## 2023-08-23 NOTE — Telephone Encounter (Signed)
Copied from CRM [id number]. Topic: Clinical - Red Word Triage >> Aug 23, 2023  8:55 AM Steele Sizer wrote: Red Word that prompted transfer to Nurse Triage: Pt thinks she has bursitis in her right elbow. She said it does hurt, her hands are swollen and her elbow has a sack on the end as well.  Chief Complaint: elbow pain Symptoms: has sack on end of elbow Frequency: constant Pertinent Negatives: Patient denies fever, injury, sob Disposition: [] ED /[] Urgent Care (no appt availability in office) / [x] Appointment(In office/virtual)/ []  [facility] [facility]/ [] Home Care/ [] Refused Recommended Disposition /[]  Mobile Bus/ []  Follow-up with PCP Additional Notes: c/o generalized pain in joints, most concerning to her is elbow pain to right side and "fluid filled sac" to elbow.  States she thinks it is bursitis.  Apt. Made to see pcp tomorrow.  Instructed to go to er if becomes worse.   Reason for Disposition  Fluid-filled sack located directly over point of elbow  Answer Assessment - Initial Assessment Questions 1. ONSET: "When did the pain start?"     Pain in joints, right elbow has sack 2. LOCATION: "Where is the pain located?"     Right elbow 3. PAIN: "How bad is the pain?" (Scale 1-10; or mild, moderate, severe)   - MILD (1-3): doesn't interfere with normal activities.   - MODERATE (4-7): interferes with normal activities (e.g., work or school) or awakens from sleep.   - SEVERE (8-10): excruciating pain, unable to do any normal activities, unable to use arm at all.     Doesn't hurt when she doesn't move 5. CAUSE: "What do you think is causing the elbow pain?"     bursitis 6. OTHER SYMPTOMS: "Do you have any other symptoms?" (e.g., neck pain, elbow swelling, rash, fever)     Elbow swelling  Protocols used: Elbow Pain-A-AH

## 2023-08-24 ENCOUNTER — Ambulatory Visit (INDEPENDENT_AMBULATORY_CARE_PROVIDER_SITE_OTHER): Payer: Medicare HMO | Admitting: Internal Medicine

## 2023-08-24 ENCOUNTER — Encounter: Payer: Self-pay | Admitting: Internal Medicine

## 2023-08-24 VITALS — BP 116/68 | HR 72 | Temp 98.6°F | Ht 59.5 in | Wt 158.0 lb

## 2023-08-24 DIAGNOSIS — M7021 Olecranon bursitis, right elbow: Secondary | ICD-10-CM | POA: Diagnosis not present

## 2023-08-24 DIAGNOSIS — M25562 Pain in left knee: Secondary | ICD-10-CM | POA: Diagnosis not present

## 2023-08-24 DIAGNOSIS — M25561 Pain in right knee: Secondary | ICD-10-CM | POA: Diagnosis not present

## 2023-08-24 NOTE — Assessment & Plan Note (Signed)
Pattern suggests OA---but mostly in the past 4 months Discussed tylenol 1000 tid Try diclofenac on knees Set up with Dr Patsy Lager for further evaluation

## 2023-08-24 NOTE — Progress Notes (Signed)
Subjective:    Patient ID: Judith [name], female    DOB: [birth year], 77 y.o.   MRN: [id number]  HPI Here due to knee pain and a lump on her right elbow  Has a bump on right elbow Noticed it ~5 days ago No known injury Elbow hurts--but not the lump. Some pain if leaning on it  Knees are hurting---and other joints also (shoulders, elbows, hands) Worse since back surgery in August Some leg swelling and feet/hands at night.  No clear joint swelling per se No pain with sitting---worst when trying to stand up. Worse if walks a lot---but okay with brief walking Stiff in AM---will loosen up after the AM tylenol  Does take tylenol 1000mg  in AM---then sometimes 500mg  tid otherwise  Current Outpatient Medications on File Prior to Visit  Medication Sig Dispense Refill   acetaminophen (TYLENOL) 500 MG tablet Take 500 mg by mouth every 6 (six) hours as needed.     Adalimumab 40 MG/0.4ML PNKT Inject into the skin.     ARNUITY ELLIPTA 100 MCG/ACT AEPB Inhale into the lungs.     Biotin 5 MG TABS Take by mouth daily.     calcium-vitamin D (OSCAL WITH D) 500-200 MG-UNIT per tablet Take 1 tablet by mouth once a week.      cholecalciferol (VITAMIN D3) 25 MCG (1000 UNIT) tablet Take 1,000 Units by mouth daily.     cyclobenzaprine (FLEXERIL) 10 MG tablet Take 0.5-1 tablets (5-10 mg total) by mouth 3 (three) times daily as needed for muscle spasms. 30 tablet 2   erythromycin ophthalmic ointment 1 Application daily as needed.     famotidine (PEPCID) 10 MG tablet Take 10 mg by mouth 2 (two) times daily as needed for heartburn or indigestion.     FIBER SELECT GUMMIES PO Take 2 tablets by mouth at bedtime.     fish oil-omega-3 fatty acids 1000 MG capsule Take 1 g by mouth once a week.     FOLIC ACID PO Take 1 tablet by mouth daily.     methotrexate (RHEUMATREX) 2.5 MG tablet Take 20 mg by mouth once a week.     metoprolol succinate (TOPROL-XL) 25 MG 24 hr tablet      MIEBO 1.338 GM/ML SOLN Apply 1  drop to eye 2 (two) times daily.     Multiple Vitamin (MULTIVITAMIN WITH MINERALS) TABS tablet Take 1 tablet by mouth at bedtime.     OXYGEN Inhale into the lungs at bedtime. 2.5 - 3.0 L     Polyethyl Glycol-Propyl Glycol (SYSTANE) 0.4-0.3 % SOLN Apply to eye daily as needed.     vitamin B-12 (CYANOCOBALAMIN) 500 MCG tablet Take 500 mcg by mouth 2 (two) times a week.     No current facility-administered medications on file prior to visit.    Allergies  Allergen Reactions   Niacin And Related Other (See Comments)    Caused black and blue marks all over body   Meloxicam Rash    Past Medical History:  Diagnosis Date   Allergic rhinitis    Allergy    Arthritis    Atrophic vaginitis    Breast cancer (HCC)    Right mastectomy   Cataract 2020   Diverticulosis    GERD (gastroesophageal reflux disease)    H/O: pneumonia    as child-viral pneumonia   Headache    History of shingles    HLD (hyperlipidemia)    pt denies   IBS (irritable bowel syndrome)  Implantable loop recorder present    OSA (obstructive sleep apnea) 06/20/2020   CPAP   Osteopenia    Oxygen deficiency at night   Sarcoidosis    Sleep apnea 2018 use CPAP   Sliding hiatal hernia     Past Surgical History:  Procedure Laterality Date   APPENDECTOMY     BASAL CELL CARCINOMA EXCISION     scalp   BASAL CELL CARCINOMA EXCISION  08/2016   nose   BREAST SURGERY     CATARACT EXTRACTION W/PHACO Right 04/29/2022   Procedure: CATARACT EXTRACTION PHACO AND INTRAOCULAR LENS PLACEMENT (IOC) RIGHT VIVITY LENS 10.90 01:10.5;  Surgeon: Lockie Mola, MD;  Location: [facility];  Service: Ophthalmology;  Laterality: Right;  sleep apnea   CATARACT EXTRACTION W/PHACO Left 05/13/2022   Procedure: CATARACT EXTRACTION PHACO AND INTRAOCULAR LENS PLACEMENT (IOC) LEFT VIVITY LENS 8.84 01:10.1;  Surgeon: Lockie Mola, MD;  Location: [facility];  Service: Ophthalmology;  Laterality: Left;  sleep apnea    COLONOSCOPY  02/2010   divertics, rpt 5 years   DEXA  2005   osteopenia, no change from 2003   DILATION AND CURETTAGE OF UTERUS  10/1999   EYE SURGERY  lasik   LAPAROSCOPIC TOTAL HYSTERECTOMY  05/2002   for fibroids/polyp   MASTECTOMY Right 02/2009   right breast cancer   MOHS SURGERY  04/17/2022   Nose   POLYPECTOMY     TOTAL ABDOMINAL HYSTERECTOMY  2003   for fibroids/polyps   TUBAL LIGATION  1977   VIDEO BRONCHOSCOPY WITH ENDOBRONCHIAL NAVIGATION N/A 02/01/2017   Procedure: VIDEO BRONCHOSCOPY WITH ENDOBRONCHIAL NAVIGATION;  Surgeon: Loreli Slot, MD;  Location: [facility];  Service: Thoracic;  Laterality: N/A;    Family History  Problem Relation Age of Onset   Prostate cancer Father    Prostate cancer Brother    Lymphoma Brother    Heart attack Paternal Grandmother    Stomach cancer Maternal Grandfather    Colon cancer Neg Hx    Rectal cancer Neg Hx    Esophageal cancer Neg Hx     Social History   Socioeconomic History   Marital status: Married    Spouse name: Not on file   Number of children: 2   Years of education: Not on file   Highest education level: Bachelor's degree (e.g., BA, AB, BS)  Occupational History   Occupation: Retired  Tobacco Use   Smoking status: Former    Current packs/day: 0.00    Average packs/day: 0.5 packs/day for 5.0 years (2.5 ttl pk-yrs)    Types: Cigarettes    Start date: 27    Quit date: 08/31/1968    Years since quitting: 55.0   Smokeless tobacco: Never  Vaping Use   Vaping status: Never Used  Substance and Sexual Activity   Alcohol use: Not Currently    Comment: occasional glass of wine   Drug use: No   Sexual activity: Yes    Birth control/protection: Post-menopausal    Comment: hysterectomy  Other Topics Concern   Not on file  Social History Narrative   Married      2 sons      Retired      2 cups coffee/daily      Gym 3-4 times/week   Social Drivers of Corporate investment banker Strain: Low Risk   (08/23/2023)   Overall Financial Resource Strain (CARDIA)    Difficulty of Paying Living Expenses: Not hard at all  Food Insecurity: No Food  Insecurity (08/23/2023)   Hunger Vital Sign    Worried About Running Out of Food in the Last Year: Never true    Ran Out of Food in the Last Year: Never true  Transportation Needs: No Transportation Needs (08/23/2023)   PRAPARE - Administrator, Civil Service (Medical): No    Lack of Transportation (Non-Medical): No  Physical Activity: Inactive (08/23/2023)   Exercise Vital Sign    Days of Exercise per Week: 0 days    Minutes of Exercise per Session: 0 min  Stress: No Stress Concern Present (08/23/2023)   [employer] of Occupational Health - Occupational Stress Questionnaire    Feeling of Stress : Only a little  Social Connections: Moderately Integrated (08/23/2023)   Social Connection and Isolation Panel [NHANES]    Frequency of Communication with Friends and Family: Twice a week    Frequency of Social Gatherings with Friends and Family: Twice a week    Attends Religious Services: Never    Database administrator or Organizations: Yes    Attends Engineer, structural: More than 4 times per year    Marital Status: Married  Catering manager Violence: Not At Risk (02/10/2023)   Humiliation, Afraid, Rape, and Kick questionnaire    Fear of Current or Ex-Partner: No    Emotionally Abused: No    Physically Abused: No    Sexually Abused: No   Review of Systems No fever methotrexate and humira for sarcoidosis     Objective:   Physical Exam Musculoskeletal:     Comments: Non inflamed modestly enlarged right olecranon bursa  Knees have no effusion No ligament findings Normal passive ROM ??very slight pain with lateral meniscus testing on right---no findings on left No sig crepitus  Neurological:     Comments: Fairly normal gait No leg weakness            Assessment & Plan:

## 2023-08-24 NOTE — Assessment & Plan Note (Signed)
Not inflamed Discussed ice or compression Avoid leaning/hitting it Should resolve eventually

## 2023-08-29 NOTE — Progress Notes (Unsigned)
Judith [name]. Judith Vent, MD, CAQ Sports Medicine [facility] [address] [place], [address]  Phone: [phone]  FAX: [phone]  Judith [name] - 77 y.o. female  MRN [id number]  Date of Birth: May 21, 1946  Date: 08/30/2023  PCP: Judy Pimple, MD  Referral: Judy Pimple, MD  Chief Complaint  Patient presents with   Knee Pain    Bilateral   Multiple Joint Pain    Shoulder, Elbows, Hand/Feet Swelling-Back surgery 4 months ago (August 29)   Fluid on Elbow    Right   Subjective:   Judith [name] is a 77 y.o. very pleasant female patient with Body mass index is 31.48 kg/m. who presents with the following:  She is a very pleasant patient was seen off-and-on for more than 10 years, and she presents in follow-up after seeing Dr. Alphonsus Sias on August 24, 2023 for both knee pain.  She has ongoing bilateral knee pain.  She has been doing some Tylenol, and Dr. Alphonsus Sias recommended also some topical Voltaren gel.  History of Sarcoid (cardiac) She sees [facility]  L4-5 decompression and fusion with cement augmentation in August   While her appointment type was specific for knee pain, the patient is actually here for diffuse arthralgia and polyarthropathy including both shoulders, both elbows, diffuse hand pain in DIP, PIP, MCP joints and to a lesser extent the wrist. She also has chronic pain in the knees and the feet and ankles. She has pain complaints of virtually all joints in the body.  Knees did not bother her until her back surgery.  Back feels better. She is having minimal swelling and effusion of the knees and she has excellent range of motion.  Hands and fingers are swollen  Off prednisone 04/2023 - from Cardiac Sarcoid.  On Humira and mtx  B basal joint pain E olecranon bursitis R medial epicondylitis  B hip pain - hip and GTB B pain B knee pain - diffuse joint line and pes bursitis B foot and ankle  pain    Review of Systems is noted in the HPI, as appropriate  Objective:   BP 120/70 (BP Location: Left Arm, Patient Position: Sitting, Cuff Size: Large)   Pulse 69   Temp 97.9 F (36.6 C) (Temporal)   Ht 4' 11.5" (1.511 m)   Wt 158 lb 8 oz (71.9 kg)   SpO2 95%   BMI 31.48 kg/m   GEN: No acute distress; alert,appropriate. PULM: Breathing comfortably in no respiratory distress PSYCH: Normally interactive.   Bilateral shoulder exam with full motion throughout.  Nontender along the clavicle, mild tenderness at the [facility] joint mild tenderness at the bicipital groove Strength is 5/5 throughout Neer testing and Leanord Asal testing is positive  Right sided olecranon bursitis, small Full range of motion at the elbows, she does have some pain at the medial epicondyles and to a lesser extent the lateral epicondyles  No focal synovitis on the DIP, PIP, MCP joints.  No effusion at the wrist.  She does have complaints of multiple joints in the hands and she does have pain at the basal joints bilaterally  Bilateral hip exam shows full motion.  She does have tenderness at the trochanteric bursa bilaterally.  No tenderness with terminal rotational maneuvers.  Full extension at the knees bilaterally and flexion to 125.  Minimal effusion Stable to varus and valgus stress ACL and PCL are intact Flexion pinch testing, McMurray's, bounce home testing are  all normal She does have tenderness at the Pes anserine bursa  She does have fairly global tenderness at the ankle as well including the talus joints the medial and lateral ankle at the peroneals and posterior tibialis. Subtalar tilt and drawer testing are unremarkable No significant tenderness at the MTP joint  Laboratory and Imaging Data: Results for orders placed or performed in visit on 08/30/23  High sensitivity CRP   Collection Time: 08/30/23 11:03 AM  Result Value Ref Range   CRP, High Sensitivity 3.070 0.000 - 5.000 mg/L   Sedimentation rate   Collection Time: 08/30/23 11:03 AM  Result Value Ref Range   Sed Rate 22 0 - 30 mm/hr  Uric acid   Collection Time: 08/30/23 11:03 AM  Result Value Ref Range   Uric Acid, Serum 7.8 (H) 2.4 - 7.0 mg/dL     Assessment and Plan:     ICD-10-CM   1. Polyarthropathy, multiple sites  M13.0 High sensitivity CRP    Sedimentation rate    ANA Screen,IFA,Reflex Titer/Pattern,Reflex Mplx 11 Ab Cascade with IdentRA    Uric acid    2. Myalgia  M79.10 High sensitivity CRP    Sedimentation rate    ANA Screen,IFA,Reflex Titer/Pattern,Reflex Mplx 11 Ab Cascade with IdentRA    Uric acid    3. Joint swelling  M25.40 High sensitivity CRP    Sedimentation rate    ANA Screen,IFA,Reflex Titer/Pattern,Reflex Mplx 11 Ab Cascade with IdentRA    Uric acid     The patient has essentially polyarthropathy of virtually all joints in the body. Specifically by her mention and on exam: Tenderness at the bilateral basal joints, multiple DIP, PIP, MCP joints.  Pain at the elbows and at the epicondyles.  Pain at the shoulders bilaterally, pain at the [facility] joint and positive pain with stress of the rotator cuff Pain at the hips bilaterally and pain at the trochanteric bursa bilaterally Chronic knee pain, with a relatively reassuring exam.  Bilateral pes anserine bursitis. Chronic ankle pain with some pain at the true ankle joint as well as the lateral and medial ankle  Global polyarthropathy suggest possible underlying rheumatological or autoimmune condition.  Normal sed rate and CRP argues against polymyalgia rheumatica.  Full rheumatological panel is pending.  Doubt the significance of minimally elevated uric acid.  Chronic myofascial pain syndrome such as fibromyalgia cannot be excluded.  Medication Management during today's office visit: No orders of the defined types were placed in this encounter.  There are no discontinued medications.  Orders placed today for conditions managed  today: Orders Placed This Encounter  Procedures   High sensitivity CRP   Sedimentation rate   ANA Screen,IFA,Reflex Titer/Pattern,Reflex Mplx 11 Ab Cascade with IdentRA   Uric acid    Disposition: No follow-ups on file.  Dr[employer] One speech-to-text software was used for transcription in this dictation.  Possible transcriptional errors can occur using Animal nutritionist.   Signed,  Elpidio Galea. Jonai Weyland, MD   Outpatient Encounter Medications as of 08/30/2023  Medication Sig   acetaminophen (TYLENOL) 500 MG tablet Take 500 mg by mouth every 6 (six) hours as needed.   Adalimumab 40 MG/0.4ML PNKT Inject into the skin.   ARNUITY ELLIPTA 100 MCG/ACT AEPB Inhale into the lungs.   Biotin 5 MG TABS Take by mouth daily.   calcium-vitamin D (OSCAL WITH D) 500-200 MG-UNIT per tablet Take 1 tablet by mouth once a week.    cholecalciferol (VITAMIN D3) 25 MCG (1000 UNIT) tablet Take 1,000  Units by mouth daily.   cyclobenzaprine (FLEXERIL) 10 MG tablet Take 0.5-1 tablets (5-10 mg total) by mouth 3 (three) times daily as needed for muscle spasms.   erythromycin ophthalmic ointment 1 Application daily as needed.   famotidine (PEPCID) 10 MG tablet Take 10 mg by mouth 2 (two) times daily as needed for heartburn or indigestion.   FIBER SELECT GUMMIES PO Take 2 tablets by mouth at bedtime.   fish oil-omega-3 fatty acids 1000 MG capsule Take 1 g by mouth once a week.   FOLIC ACID PO Take 1 tablet by mouth daily.   methotrexate (RHEUMATREX) 2.5 MG tablet Take 20 mg by mouth once a week.   metoprolol succinate (TOPROL-XL) 25 MG 24 hr tablet    MIEBO 1.338 GM/ML SOLN Apply 1 drop to eye 2 (two) times daily.   Multiple Vitamin (MULTIVITAMIN WITH MINERALS) TABS tablet Take 1 tablet by mouth at bedtime.   OXYGEN Inhale into the lungs at bedtime. 2.5 - 3.0 L   Polyethyl Glycol-Propyl Glycol (SYSTANE) 0.4-0.3 % SOLN Apply to eye daily as needed.   vitamin B-12 (CYANOCOBALAMIN) 500 MCG tablet Take 500 mcg by  mouth 2 (two) times a week.   No facility-administered encounter medications on file as of 08/30/2023.

## 2023-08-30 ENCOUNTER — Ambulatory Visit (INDEPENDENT_AMBULATORY_CARE_PROVIDER_SITE_OTHER): Payer: Medicare HMO | Admitting: Family Medicine

## 2023-08-30 VITALS — BP 120/70 | HR 69 | Temp 97.9°F | Ht 59.5 in | Wt 158.5 lb

## 2023-08-30 DIAGNOSIS — M130 Polyarthritis, unspecified: Secondary | ICD-10-CM

## 2023-08-30 DIAGNOSIS — M2540 Effusion, unspecified joint: Secondary | ICD-10-CM

## 2023-08-30 DIAGNOSIS — G8929 Other chronic pain: Secondary | ICD-10-CM

## 2023-08-30 DIAGNOSIS — M791 Myalgia, unspecified site: Secondary | ICD-10-CM | POA: Diagnosis not present

## 2023-08-30 LAB — SEDIMENTATION RATE: Sed Rate: 22 mm/h (ref 0–30)

## 2023-08-30 LAB — URIC ACID: Uric Acid, Serum: 7.8 mg/dL — ABNORMAL HIGH (ref 2.4–7.0)

## 2023-08-30 LAB — HIGH SENSITIVITY CRP: CRP, High Sensitivity: 3.07 mg/L (ref 0.000–5.000)

## 2023-08-31 ENCOUNTER — Encounter: Payer: Self-pay | Admitting: Family Medicine

## 2023-09-03 LAB — ANA SCREEN,IFA,REFLEX TITER/PATTERN,REFLEX MPLX 11 AB CASCADE
Anti Nuclear Antibody (ANA): POSITIVE — AB
Cyclic Citrullin Peptide Ab: <16 U
MUTATED CITRULLINATED VIMENTIN (MCV) AB: <20 U/mL (ref <20)
Rheumatoid fact SerPl-aCnc: <10 [IU]/mL (ref <14)

## 2023-09-03 LAB — TIER 2
Jo-1 Autoabs: <1 AI
SSA (Ro) (ENA) Antibody, IgG: <1 AI
SSB (La) (ENA) Antibody, IgG: <1 AI
Scleroderma (Scl-70) (ENA) Antibody, IgG: <1 AI

## 2023-09-03 LAB — TIER 1
Chromatin (Nucleosomal) Antibody: <1 AI
ENA SM Ab Ser-aCnc: <1 AI
Ribonucleic Protein(ENA) Antibody, IgG: <1 AI
SM/RNP: <1 AI
ds DNA Ab: 1 [IU]/mL

## 2023-09-03 LAB — ANTI-NUCLEAR AB-TITER (ANA TITER): ANA Titer 1: 1:40 {titer} — ABNORMAL HIGH

## 2023-09-03 LAB — TIER 3
Centromere Ab Screen: <1 AI
Ribosomal P Protein Ab: <1 AI

## 2023-09-03 LAB — INTERPRETATION

## 2023-09-04 DIAGNOSIS — D869 Sarcoidosis, unspecified: Secondary | ICD-10-CM | POA: Diagnosis not present

## 2023-09-08 ENCOUNTER — Encounter: Payer: Self-pay | Admitting: Family Medicine

## 2023-09-09 DIAGNOSIS — R000 Tachycardia, unspecified: Secondary | ICD-10-CM | POA: Diagnosis not present

## 2023-09-09 DIAGNOSIS — R0602 Shortness of breath: Secondary | ICD-10-CM | POA: Diagnosis not present

## 2023-09-09 DIAGNOSIS — G4733 Obstructive sleep apnea (adult) (pediatric): Secondary | ICD-10-CM | POA: Diagnosis not present

## 2023-09-09 DIAGNOSIS — D869 Sarcoidosis, unspecified: Secondary | ICD-10-CM | POA: Diagnosis not present

## 2023-09-09 DIAGNOSIS — D8685 Sarcoid myocarditis: Secondary | ICD-10-CM | POA: Diagnosis not present

## 2023-09-09 DIAGNOSIS — Z95818 Presence of other cardiac implants and grafts: Secondary | ICD-10-CM | POA: Diagnosis not present

## 2023-09-09 DIAGNOSIS — E785 Hyperlipidemia, unspecified: Secondary | ICD-10-CM | POA: Diagnosis not present

## 2023-09-13 DIAGNOSIS — Z4509 Encounter for adjustment and management of other cardiac device: Secondary | ICD-10-CM | POA: Diagnosis not present

## 2023-09-13 DIAGNOSIS — D8685 Sarcoid myocarditis: Secondary | ICD-10-CM | POA: Diagnosis not present

## 2023-09-13 DIAGNOSIS — R000 Tachycardia, unspecified: Secondary | ICD-10-CM | POA: Diagnosis not present

## 2023-09-22 DIAGNOSIS — D8685 Sarcoid myocarditis: Secondary | ICD-10-CM | POA: Diagnosis not present

## 2023-09-22 DIAGNOSIS — Z79899 Other long term (current) drug therapy: Secondary | ICD-10-CM | POA: Diagnosis not present

## 2023-10-05 DIAGNOSIS — D869 Sarcoidosis, unspecified: Secondary | ICD-10-CM | POA: Diagnosis not present

## 2023-10-07 DIAGNOSIS — D485 Neoplasm of uncertain behavior of skin: Secondary | ICD-10-CM | POA: Diagnosis not present

## 2023-10-07 DIAGNOSIS — Z08 Encounter for follow-up examination after completed treatment for malignant neoplasm: Secondary | ICD-10-CM | POA: Diagnosis not present

## 2023-10-07 DIAGNOSIS — Z85828 Personal history of other malignant neoplasm of skin: Secondary | ICD-10-CM | POA: Diagnosis not present

## 2023-10-07 DIAGNOSIS — Z85820 Personal history of malignant melanoma of skin: Secondary | ICD-10-CM | POA: Diagnosis not present

## 2023-10-07 DIAGNOSIS — D229 Melanocytic nevi, unspecified: Secondary | ICD-10-CM | POA: Diagnosis not present

## 2023-10-07 DIAGNOSIS — L820 Inflamed seborrheic keratosis: Secondary | ICD-10-CM | POA: Diagnosis not present

## 2023-10-07 DIAGNOSIS — Z09 Encounter for follow-up examination after completed treatment for conditions other than malignant neoplasm: Secondary | ICD-10-CM | POA: Diagnosis not present

## 2023-10-14 ENCOUNTER — Ambulatory Visit: Payer: Medicare HMO | Admitting: Family Medicine

## 2023-10-14 ENCOUNTER — Encounter: Payer: Self-pay | Admitting: Family Medicine

## 2023-10-14 VITALS — BP 100/64 | HR 70 | Temp 98.1°F | Ht 59.5 in | Wt 156.2 lb

## 2023-10-14 DIAGNOSIS — K5792 Diverticulitis of intestine, part unspecified, without perforation or abscess without bleeding: Secondary | ICD-10-CM

## 2023-10-14 DIAGNOSIS — K529 Noninfective gastroenteritis and colitis, unspecified: Secondary | ICD-10-CM

## 2023-10-14 MED ORDER — CIPROFLOXACIN HCL 750 MG PO TABS: 750.0000 mg | ORAL_TABLET | Freq: Two times a day (BID) | ORAL | 0 refills | Status: DC

## 2023-10-14 NOTE — Assessment & Plan Note (Signed)
Acute left sided abdominal pain typical of previous cases of diverticulitis for patient.  In the setting of chronic diarrhea. Presumed diverticulitis.  Discussed possible CT scan to obtain a definitive diagnosis but she would like to hold off at this time. No red flags, no fever, tolerating p.o. Patient very hesitant about using antibiotics as previously when using Augmentin it significantly worsened her diarrhea and she had to stop. We discussed that sometimes diverticulitis can be treated with bowel rest.  She will do a short trial of this today and tomorrow but will start the antibiotic I sent in if not improving.  We also reviewed red flags or reasons to start the antibiotic sooner as well as ER precautions. Of note I chose Cipro alone primarily because she wants to avoid Flagyl which is likely to cause her significant diarrhea/GI upset.  She will follow-up closely with myself or PCP early next week.

## 2023-10-14 NOTE — Progress Notes (Signed)
Patient ID: Judith [name], female    DOB: [birth year], 78 y.o.   MRN: [id number]  This visit was conducted in person.  BP 100/64 (BP Location: Left Arm, Patient Position: Sitting, Cuff Size: Large)   Pulse 70   Temp 98.1 F (36.7 C) (Temporal)   Ht 4' 11.5" (1.511 m)   Wt 156 lb 4 oz (70.9 kg)   SpO2 96%   BMI 31.03 kg/m    CC:  Chief Complaint  Patient presents with   Abdominal Pain    LLQ    Chills    Subjective:   HPI: [place] is a 78 y.o. female presenting on 10/14/2023 for Abdominal Pain (LLQ ) and Chills   New onset LLQ abdominal pain and chills in patient with history of  diverticulosis She reports new onset  in last 2 days of LLQ pain and chills.  Current symptoms feel like diverticulitis. (Last spell years ago)  LLQ pain is worse with walking.... 6/10 on pain scale.  No fever.   Nausea, no vomiting.  Decreased appetite.  No blood in stool.  Continued chronic diarrhea.     Seen by PCP in November for diarrhea... referred to GI at [facility]   She is very hesitant about use Augmentin or antibiotics given it worsened her diarrhea in the past.  Relevant past medical, surgical, family and social history reviewed and updated as indicated. Interim medical history since our last visit reviewed. Allergies and medications reviewed and updated. Outpatient Medications Prior to Visit  Medication Sig Dispense Refill   acetaminophen (TYLENOL) 500 MG tablet Take 500 mg by mouth every 6 (six) hours as needed.     Adalimumab 40 MG/0.4ML PNKT Inject into the skin.     ARNUITY ELLIPTA 100 MCG/ACT AEPB Inhale into the lungs.     Biotin 5 MG TABS Take by mouth daily.     calcium-vitamin D (OSCAL WITH D) 500-200 MG-UNIT per tablet Take 1 tablet by mouth once a week.      cholecalciferol (VITAMIN D3) 25 MCG (1000 UNIT) tablet Take 1,000 Units by mouth daily.     cyclobenzaprine (FLEXERIL) 10 MG tablet Take 0.5-1 tablets (5-10 mg total) by mouth 3 (three) times daily  as needed for muscle spasms. 30 tablet 2   erythromycin ophthalmic ointment 1 Application daily as needed.     famotidine (PEPCID) 10 MG tablet Take 10 mg by mouth 2 (two) times daily as needed for heartburn or indigestion.     FIBER SELECT GUMMIES PO Take 2 tablets by mouth at bedtime.     fish oil-omega-3 fatty acids 1000 MG capsule Take 1 g by mouth once a week.     FOLIC ACID PO Take 1 tablet by mouth daily.     methotrexate (RHEUMATREX) 2.5 MG tablet Take 20 mg by mouth once a week.     metoprolol succinate (TOPROL-XL) 25 MG 24 hr tablet      Multiple Vitamin (MULTIVITAMIN WITH MINERALS) TABS tablet Take 1 tablet by mouth at bedtime.     OXYGEN Inhale into the lungs at bedtime. 2.5 - 3.0 L     Polyethyl Glycol-Propyl Glycol (SYSTANE) 0.4-0.3 % SOLN Apply to eye daily as needed.     vitamin B-12 (CYANOCOBALAMIN) 500 MCG tablet Take 500 mcg by mouth 2 (two) times a week.     MIEBO 1.338 GM/ML SOLN Apply 1 drop to eye 2 (two) times daily.     No facility-administered medications prior to  visit.     Per HPI unless specifically indicated in ROS section below Review of Systems  Constitutional:  Negative for fatigue and fever.  HENT:  Negative for congestion.   Eyes:  Negative for pain.  Respiratory:  Negative for cough and shortness of breath.   Cardiovascular:  Negative for chest pain, palpitations and leg swelling.  Gastrointestinal:  Positive for abdominal pain and diarrhea.  Genitourinary:  Negative for dysuria and vaginal bleeding.  Musculoskeletal:  Negative for back pain.  Neurological:  Negative for syncope, light-headedness and headaches.  Psychiatric/Behavioral:  Negative for dysphoric mood.    Objective:  BP 100/64 (BP Location: Left Arm, Patient Position: Sitting, Cuff Size: Large)   Pulse 70   Temp 98.1 F (36.7 C) (Temporal)   Ht 4' 11.5" (1.511 m)   Wt 156 lb 4 oz (70.9 kg)   SpO2 96%   BMI 31.03 kg/m   Wt Readings from Last 3 Encounters:  10/14/23 156 lb 4 oz  (70.9 kg)  08/30/23 158 lb 8 oz (71.9 kg)  08/24/23 158 lb (71.7 kg)      Physical Exam Constitutional:      General: She is not in acute distress.    Appearance: Normal appearance. She is well-developed. She is not ill-appearing or toxic-appearing.  HENT:     Head: Normocephalic.     Right Ear: Hearing, tympanic membrane, ear canal and external ear normal. Tympanic membrane is not erythematous, retracted or bulging.     Left Ear: Hearing, tympanic membrane, ear canal and external ear normal. Tympanic membrane is not erythematous, retracted or bulging.     Nose: No mucosal edema or rhinorrhea.     Right Sinus: No maxillary sinus tenderness or frontal sinus tenderness.     Left Sinus: No maxillary sinus tenderness or frontal sinus tenderness.     Mouth/Throat:     Pharynx: Uvula midline.  Eyes:     General: Lids are normal. Lids are everted, no foreign bodies appreciated.     Conjunctiva/sclera: Conjunctivae normal.     Pupils: Pupils are equal, round, and reactive to light.  Neck:     Thyroid: No thyroid mass or thyromegaly.     Vascular: No carotid bruit.     Trachea: Trachea normal.  Cardiovascular:     Rate and Rhythm: Normal rate and regular rhythm.     Pulses: Normal pulses.     Heart sounds: Normal heart sounds, S1 normal and S2 normal. No murmur heard.    No friction rub. No gallop.  Pulmonary:     Effort: Pulmonary effort is normal. No tachypnea or respiratory distress.     Breath sounds: Normal breath sounds. No decreased breath sounds, wheezing, rhonchi or rales.  Abdominal:     General: Bowel sounds are normal.     Palpations: Abdomen is soft.     Tenderness: There is abdominal tenderness in the left upper quadrant and left lower quadrant. There is rebound. There is no right CVA tenderness or guarding. Negative signs include Murphy's sign, Rovsing's sign, McBurney's sign, psoas sign and obturator sign.  Musculoskeletal:     Cervical back: Normal range of motion and  neck supple.  Skin:    General: Skin is warm and dry.     Findings: No rash.  Neurological:     Mental Status: She is alert.  Psychiatric:        Mood and Affect: Mood is not anxious or depressed.  Speech: Speech normal.        Behavior: Behavior normal. Behavior is cooperative.        Thought Content: Thought content normal.        Judgment: Judgment normal.       Results for orders placed or performed in visit on 08/30/23  High sensitivity CRP   Collection Time: 08/30/23 11:03 AM  Result Value Ref Range   CRP, High Sensitivity 3.070 0.000 - 5.000 mg/L  Sedimentation rate   Collection Time: 08/30/23 11:03 AM  Result Value Ref Range   Sed Rate 22 0 - 30 mm/hr  ANA Screen,IFA,Reflex Titer/Pattern,Reflex Mplx 11 Ab Cascade with IdentRA   Collection Time: 08/30/23 11:03 AM  Result Value Ref Range   Anti Nuclear Antibody (ANA) POSITIVE (A) NEGATIVE   Rheumatoid fact SerPl-aCnc <10 <14 IU/mL   Cyclic Citrullin Peptide Ab <96 UNITS   MUTATED CITRULLINATED VIMENTIN (MCV) AB <20 <20 U/mL  Uric acid   Collection Time: 08/30/23 11:03 AM  Result Value Ref Range   Uric Acid, Serum 7.8 (H) 2.4 - 7.0 mg/dL  Anti-nuclear ab-titer (ANA titer)   Collection Time: 08/30/23 11:03 AM  Result Value Ref Range   ANA Titer 1 1:40 (H) titer   ANA Pattern 1 Cytoplasmic (A)   TIER 1   Collection Time: 08/30/23 11:03 AM  Result Value Ref Range   ds DNA Ab 1 IU/mL   ENA SM Ab Ser-aCnc <1.0 NEG <1.0 NEG AI   SM/RNP <1.0 NEG <1.0 NEG AI   Ribonucleic Protein(ENA) Antibody, IgG <1.0 NEG <1.0 NEG AI   Chromatin (Nucleosomal) Antibody <1.0 NEG <1.0 NEG AI  TIER 2   Collection Time: 08/30/23 11:03 AM  Result Value Ref Range   SSA (Ro) (ENA) Antibody, IgG <1.0 NEG <1.0 NEG AI   SSB (La) (ENA) Antibody, IgG <1.0 NEG <1.0 NEG AI   Scleroderma (Scl-70) (ENA) Antibody, IgG <1.0 NEG <1.0 NEG AI   Jo-1 Autoabs <1.0 NEG <1.0 NEG AI  Tier 3   Collection Time: 08/30/23 11:03 AM  Result Value Ref  Range   Centromere Ab Screen <1.0 NEG <1.0 NEG AI   Ribosomal P Protein Ab <1.0 NEG <1.0 NEG AI  Interpretation   Collection Time: 08/30/23 11:03 AM  Result Value Ref Range   INTERPRETATION      Assessment and Plan  Acute diverticulitis Assessment & Plan: Acute left sided abdominal pain typical of previous cases of diverticulitis for patient.  In the setting of chronic diarrhea. Presumed diverticulitis.  Discussed possible CT scan to obtain a definitive diagnosis but she would like to hold off at this time. No red flags, no fever, tolerating p.o. Patient very hesitant about using antibiotics as previously when using Augmentin it significantly worsened her diarrhea and she had to stop. We discussed that sometimes diverticulitis can be treated with bowel rest.  She will do a short trial of this today and tomorrow but will start the antibiotic I sent in if not improving.  We also reviewed red flags or reasons to start the antibiotic sooner as well as ER precautions. Of note I chose Cipro alone primarily because she wants to avoid Flagyl which is likely to cause her significant diarrhea/GI upset.  She will follow-up closely with myself or PCP early next week.   Chronic diarrhea  Other orders -     Ciprofloxacin HCl; Take 1 tablet (750 mg total) by mouth 2 (two) times daily.  Dispense: 20 tablet; Refill: 0  Return in about 5 days (around 10/19/2023) for  follow up eraly next week with PCP or Dr. Leonard Schwartz..   Kerby Nora, MD

## 2023-10-14 NOTE — Patient Instructions (Signed)
Push clear liquids... start bowel rest.  If pain not improving in next 1-2 days... start antibiotics.... will try to treat just with CIPRO as it should not cause diarrhea, But if not improving we will need to broaden antibiotics.  Go to ER if cannot tolerate liquids, if fever and severe pain

## 2023-11-01 ENCOUNTER — Ambulatory Visit: Payer: Self-pay | Admitting: Family Medicine

## 2023-11-01 NOTE — Telephone Encounter (Signed)
FYI for appt tomorrow

## 2023-11-01 NOTE — Telephone Encounter (Signed)
Agree with precautions given to pt  Agree with nurse assessment in plan.  Thank you for speaking with them.

## 2023-11-01 NOTE — Telephone Encounter (Signed)
  Chief Complaint: chest tightness Symptoms: chest tightness only with coughing, "tickling" in the throat Frequency: 1 wk Pertinent Negatives: Patient denies SOB, CP without coughing, fever, vomiting Disposition: [] ED /[] Urgent Care (no appt availability in office) / [x] Appointment(In office/virtual)/ []  [facility]/ [] Home Care/ [] Refused Recommended Disposition /[] [facility]/ []  Follow-up with PCP Additional Notes: Pt reports chest tightness just with coughing. Tightness dissipates once coughing stops. Also endorses "tickling" in the throat. Cough for 1 wk with clear mucus. No N/V. Does endorse diarrhea but states that is normal for her and it is no worse. Also endorses lightheadedness when standing up but states that is also baseline for her and is no worse. Denies SOB. Denies fever. States husband tested positive for the flu today. Pt states she developed a cough before him but has overall felt better than him because she is taking steroids for arthritis. Per protocol pt scheduled in office tomorrow at 10AM. Pt agreeable. RN advised pt she needs to call 911 if she has SOB or if she develops chest pain not with coughing, and to call us back for any other worsening to which she verbalized understanding.    Reason for Disposition  [1] Chest pain(s) lasting a few seconds from coughing AND [2] persists > 3 days  Answer Assessment - Initial Assessment Questions 1. LOCATION: "Where does it hurt?"       "In the middle" 2. RADIATION: "Does the pain go anywhere else?" (e.g., into neck, jaw, arms, back)     No - other than arthritis 3. ONSET: "When did the chest pain begin?" (Minutes, hours or days)      Cough for 1 week 4. PATTERN: "Does the pain come and go, or has it been constant since it started?"  "Does it get worse with exertion?"      Comes and goes 5. DURATION: "How long does it last" (e.g., seconds, minutes, hours)     Throat is always irritated, tightness is with  coughing 6. SEVERITY: "How bad is the pain?"  (e.g., Scale 1-10; mild, moderate, or severe)    - MILD (1-3): doesn't interfere with normal activities     - MODERATE (4-7): interferes with normal activities or awakens from sleep    - SEVERE (8-10): excruciating pain, unable to do any normal activities       1/10 - "tickling, nuisance" 7. CARDIAC RISK FACTORS: "Do you have any history of heart problems or risk factors for heart disease?" (e.g., angina, prior heart attack; diabetes, high blood pressure, high cholesterol, smoker, or strong family history of heart disease)     "Sarcoid in my heart" 8. PULMONARY RISK FACTORS: "Do you have any history of lung disease?"  (e.g., blood clots in lung, asthma, emphysema, birth control pills)     OSA, nodules, respiratory failure  9. CAUSE: "What do you think is causing the chest pain?"     Chest tightness with cough, husband has flu 10. OTHER SYMPTOMS: "Do you have any other symptoms?" (e.g., dizziness, nausea, vomiting, sweating, fever, difficulty breathing, cough)       Cough, pt was on prednisone for joint pain. Husband flu +. No SOB. CPAP helps at night. Clear mucus. No fever. Has diarrhea but states that is normal for her. No sweating, no chills. Chest tightness with cough only. Lightheadedness, "always do when I stand up."  Protocols used: Chest Pain-A-AH

## 2023-11-02 ENCOUNTER — Ambulatory Visit (INDEPENDENT_AMBULATORY_CARE_PROVIDER_SITE_OTHER): Admitting: General Practice

## 2023-11-02 ENCOUNTER — Encounter: Payer: Self-pay | Admitting: General Practice

## 2023-11-02 ENCOUNTER — Ambulatory Visit: Admitting: Family

## 2023-11-02 ENCOUNTER — Ambulatory Visit (INDEPENDENT_AMBULATORY_CARE_PROVIDER_SITE_OTHER)
Admission: RE | Admit: 2023-11-02 | Discharge: 2023-11-02 | Disposition: A | Discharge status: Home / Self Care | Source: Ambulatory Visit | Attending: General Practice | Admitting: General Practice

## 2023-11-02 VITALS — BP 120/70 | HR 76 | Temp 98.3°F | Ht 59.5 in | Wt 152.0 lb

## 2023-11-02 DIAGNOSIS — R9389 Abnormal findings on diagnostic imaging of other specified body structures: Secondary | ICD-10-CM | POA: Diagnosis not present

## 2023-11-02 DIAGNOSIS — R918 Other nonspecific abnormal finding of lung field: Secondary | ICD-10-CM | POA: Diagnosis not present

## 2023-11-02 DIAGNOSIS — R051 Acute cough: Secondary | ICD-10-CM

## 2023-11-02 DIAGNOSIS — D869 Sarcoidosis, unspecified: Secondary | ICD-10-CM | POA: Diagnosis not present

## 2023-11-02 DIAGNOSIS — R0789 Other chest pain: Secondary | ICD-10-CM | POA: Diagnosis not present

## 2023-11-02 LAB — POCT INFLUENZA A/B
Influenza A, POC: NEGATIVE
Influenza B, POC: NEGATIVE

## 2023-11-02 MED ORDER — PREDNISONE 20 MG PO TABS: 20.0000 mg | ORAL_TABLET | Freq: Every day | ORAL | 0 refills | Status: AC

## 2023-11-02 MED ORDER — BENZONATATE 200 MG PO CAPS: 200.0000 mg | ORAL_CAPSULE | Freq: Three times a day (TID) | ORAL | 0 refills | Status: DC | PRN

## 2023-11-02 MED ORDER — PROMETHAZINE-DM 6.25-15 MG/5ML PO SYRP: 5.0000 mL | ORAL_SOLUTION | Freq: Four times a day (QID) | ORAL | 0 refills | Status: DC | PRN

## 2023-11-02 NOTE — Assessment & Plan Note (Addendum)
 Differentials include acute on chronic cough r/t sarcoidosis, pneumonia, influenza   POC influenza negative. Vital signs stable.  Rales ausculted in RLL.  STAT chest x-ray.  Rx sent for prednisone burst 20 mg once daily for five days.  Rx sent for benzonatate 200 mg TID as needed.  Rx sent for Promethazine-dm as needed for cough.  Discussed side effects with patient at length. She verbalizes understanding.   ER precautions given.  Consider antimicrobial therapy. Await results.  F/u if symptoms worsen or do not improve.

## 2023-11-02 NOTE — Patient Instructions (Addendum)
 Complete xray(s) prior to leaving today. I will notify you of your results once received.   Start prednisone 20 mg tablets. Take 2 tablets by mouth once daily in the morning for 5 days.   Start Benzonatate capsules for cough. Take 1 capsule by mouth three times daily as needed for cough.  Start Promethazine-DM 5 ml as needed. It can cause drowsiness.   You can try a few things over the counter to help with your symptoms including:  Cough: Delsym or Robitussin (get the off brand, works just as well) Chest Congestion: Mucinex (plain) Nasal Congestion/Ear Pressure/Sinus Pressure: Try using Flonase (fluticasone) nasal spray. Instill 1 spray in each nostril twice daily. This can be purchased over the counter. Body aches, fevers, headache: Ibuprofen (not to exceed 2400 mg in 24 hours) or Acetaminophen-Tylenol (not to exceed 3000 mg in 24 hours) Runny Nose/Throat Drainage/Sneezing/Itchy or Watery Eyes: An antihistamine such as Zyrtec, Claritin, Xyzal, Allegra  Schedule follow up with PCP if symptoms worsen or do not improve.   It was a pleasure meeting you!

## 2023-11-02 NOTE — Progress Notes (Signed)
 Established Patient Office Visit  Subjective   Patient ID: Judith [name], female    DOB: August 25, 1946  Age: 78 y.o. MRN: [id number]  Chief Complaint  Patient presents with   Cough    With some chest tightness but dissipates when coughing stops. Has had cough over a week. Husband tested positive for flu A yesterday. Patient has been using cough drops for cough.     HPI  Judith [name] is a 78 year old female, patient of Dr. Milinda Antis, with past medical history of sarcoidosis, GERD, multiple lung nodules, OSA, chronic respiratory failure, presents today for an acute visit.   Her symptom onset was 10/24/23 with cough, chills, fatigue. Overall, she was improving until a couple of days ago. She developed congestion, more fatigue and tired, cough and dyspnea with exertion and talking. She was on prednisone pack for join pain and her last dose was Thursday. She noticed her symptoms started getting worse after the prednisone pack was finished. She is to update on her pneumonia vaccine, flu vaccine and her covid booster. Her husband went to the UC yesterday and tested positive for flu A. She is a former smoker. She uses a CPAP at home and has oxygen at home. She has been using her inhalers as prescribed. Her O2 levels at home have been averaging 90-92%.    Patient Active Problem List   Diagnosis Date Noted   Knee pain, bilateral 08/24/2023   Olecranon bursitis of right elbow 08/24/2023   Chronic diarrhea 07/26/2023   Positive colorectal cancer screening using Cologuard test 02/23/2022   Chronic respiratory failure with hypoxia (HCC) 06/20/2020   OSA (obstructive sleep apnea) 06/20/2020   Unknown status of immunity to COVID-19 virus 03/18/2020   Acute diverticulitis 11/17/2018   Degeneration of thoracic intervertebral disc 06/17/2018   Encounter for screening mammogram for breast cancer 01/07/2018   Dyspnea 10/25/2017   Sinus tachycardia 10/25/2017   Multiple lung nodules on CT 10/23/2017    Spondylolisthesis, lumbar region 10/14/2017   Acute cough 11/16/2016   Routine general medical examination at a health care facility 01/06/2016   IBS (irritable colon syndrome) 05/31/2015   Colon polyps 05/31/2015   Estrogen deficiency 12/05/2014   Personal history of other malignant neoplasm of skin 09/21/2013   Sarcoidosis 08/09/2012   Mixed incontinence urge and stress 03/16/2012   Alopecia 11/14/2010   GERD 01/24/2009   NEOPLASM, MALIGNANT, BREAST, HX OF 01/02/2009   Hyperlipidemia 11/17/2007   Osteopenia 11/17/2007   Tinnitus 09/07/2007   Allergic rhinitis 09/07/2007   Diverticulosis of colon 09/07/2007   Fatty liver 09/07/2007   History of diverticulitis 08/12/2007   Past Medical History:  Diagnosis Date   Allergic rhinitis    Allergy    Arthritis    Atrophic vaginitis    Breast cancer (HCC)    Right mastectomy   Cataract 2020   Diverticulosis    GERD (gastroesophageal reflux disease)    H/O: pneumonia    as child-viral pneumonia   Headache    History of shingles    HLD (hyperlipidemia)    pt denies   IBS (irritable bowel syndrome)    Implantable loop recorder present    OSA (obstructive sleep apnea) 06/20/2020   CPAP   Osteopenia    Oxygen deficiency at night   Sarcoidosis    Sleep apnea 2018 use CPAP   Sliding hiatal hernia    Allergies  Allergen Reactions   Niacin And Related Other (See Comments)    Caused black  and blue marks all over body   Meloxicam Rash         11/02/2023    3:47 PM 07/26/2023   10:05 AM 02/10/2023    1:45 PM  Depression screen PHQ 2/9  Decreased Interest 1 1 0  Down, Depressed, Hopeless 0 0 0  PHQ - 2 Score 1 1 0  Altered sleeping 0 1   Tired, decreased energy 2 2   Change in appetite 2 1   Feeling bad or failure about yourself  0 0   Trouble concentrating 0 1   Moving slowly or fidgety/restless 0 0   Suicidal thoughts 0 0   PHQ-9 Score 5 6   Difficult doing work/chores Not difficult at all Somewhat difficult         11/02/2023    3:47 PM 07/26/2023   10:05 AM  GAD 7 : Generalized Anxiety Score  Nervous, Anxious, on Edge 0 0  Control/stop worrying 0 0  Worry too much - different things 0 1  Trouble relaxing 0 1  Restless 0 0  Easily annoyed or irritable 0 1  Afraid - awful might happen 0 0  Total GAD 7 Score 0 3  Anxiety Difficulty Not difficult at all Somewhat difficult      Review of Systems  Constitutional:  Positive for chills. Negative for fever.  HENT:  Positive for congestion. Negative for ear pain, sinus pain and sore throat.   Eyes:  Negative for blurred vision.  Respiratory:  Positive for cough and shortness of breath.   Cardiovascular:  Negative for chest pain.  Gastrointestinal:  Negative for abdominal pain, constipation, diarrhea, heartburn, nausea and vomiting.  Genitourinary:  Negative for dysuria, frequency and urgency.  Neurological:  Negative for dizziness and headaches.  Endo/Heme/Allergies:  Negative for polydipsia.  Psychiatric/Behavioral:  Negative for depression and suicidal ideas. The patient is not nervous/anxious.       Objective:     BP 120/70 (BP Location: Left Arm, Patient Position: Sitting, Cuff Size: Normal)   Pulse 76   Temp 98.3 F (36.8 C) (Oral)   Ht 4' 11.5" (1.511 m)   Wt 152 lb (68.9 kg)   SpO2 92%   BMI 30.19 kg/m  BP Readings from Last 3 Encounters:  11/02/23 120/70  10/14/23 100/64  08/30/23 120/70   Wt Readings from Last 3 Encounters:  11/02/23 152 lb (68.9 kg)  10/14/23 156 lb 4 oz (70.9 kg)  08/30/23 158 lb 8 oz (71.9 kg)      Physical Exam Vitals and nursing note reviewed.  Constitutional:      Appearance: Normal appearance.  HENT:     Nose: Congestion present.     Mouth/Throat:     Pharynx: Oropharynx is clear.  Eyes:     Conjunctiva/sclera: Conjunctivae normal.  Cardiovascular:     Rate and Rhythm: Normal rate and regular rhythm.     Pulses: Normal pulses.     Heart sounds: Normal heart sounds.  Pulmonary:      Effort: Pulmonary effort is normal.     Breath sounds: Rales present. No wheezing.  Neurological:     Mental Status: She is alert and oriented to person, place, and time.  Psychiatric:        Mood and Affect: Mood normal.        Behavior: Behavior normal.        Thought Content: Thought content normal.        Judgment: Judgment normal.  Results for orders placed or performed in visit on 11/02/23  POCT Influenza A/B  Result Value Ref Range   Influenza A, POC Negative Negative   Influenza B, POC Negative Negative       The 10-year ASCVD risk score (Arnett DK, et al., 2019) is: 25.6%    Assessment & Plan:  Acute cough Assessment & Plan: Differentials include acute on chronic cough r/t sarcoidosis, pneumonia, influenza   POC influenza negative. Vital signs stable.  Rales ausculted in RLL.  STAT chest x-ray.  Rx sent for prednisone burst 20 mg once daily for five days.  Rx sent for benzonatate 200 mg TID as needed.  Rx sent for Promethazine-dm as needed for cough.  Discussed side effects with patient at length. She verbalizes understanding.   ER precautions given.  Consider antimicrobial therapy. Await results.  F/u if symptoms worsen or do not improve.   Orders: -     DG Chest 2 View -     Benzonatate; Take 1 capsule (200 mg total) by mouth 3 (three) times daily as needed.  Dispense: 20 capsule; Refill: 0 -     POCT Influenza A/B -     Promethazine-DM; Take 5 mLs by mouth 4 (four) times daily as needed.  Dispense: 118 mL; Refill: 0 -     predniSONE; Take 1 tablet (20 mg total) by mouth daily for 5 days.  Dispense: 5 tablet; Refill: 0     Return if symptoms worsen or fail to improve.    Modesto Charon, NP

## 2023-11-03 ENCOUNTER — Other Ambulatory Visit: Payer: Self-pay | Admitting: General Practice

## 2023-11-03 ENCOUNTER — Encounter: Payer: Self-pay | Admitting: General Practice

## 2023-11-03 DIAGNOSIS — R051 Acute cough: Secondary | ICD-10-CM

## 2023-11-03 MED ORDER — AZITHROMYCIN 250 MG PO TABS
ORAL_TABLET | ORAL | 0 refills | Status: AC
Start: 2023-11-03 — End: 2023-11-08

## 2023-11-07 DIAGNOSIS — K862 Cyst of pancreas: Secondary | ICD-10-CM | POA: Diagnosis not present

## 2023-11-09 DIAGNOSIS — G4733 Obstructive sleep apnea (adult) (pediatric): Secondary | ICD-10-CM | POA: Diagnosis not present

## 2023-11-15 DIAGNOSIS — K529 Noninfective gastroenteritis and colitis, unspecified: Secondary | ICD-10-CM | POA: Diagnosis not present

## 2023-11-15 DIAGNOSIS — R12 Heartburn: Secondary | ICD-10-CM | POA: Diagnosis not present

## 2023-12-03 DIAGNOSIS — D869 Sarcoidosis, unspecified: Secondary | ICD-10-CM | POA: Diagnosis not present

## 2023-12-15 ENCOUNTER — Other Ambulatory Visit: Payer: Self-pay | Admitting: Family Medicine

## 2023-12-15 ENCOUNTER — Encounter: Payer: Self-pay | Admitting: Family Medicine

## 2023-12-16 ENCOUNTER — Other Ambulatory Visit: Payer: Self-pay

## 2023-12-16 MED ORDER — METHOCARBAMOL 500 MG PO TABS: ORAL_TABLET | ORAL | 0 refills | Status: DC

## 2023-12-16 NOTE — Telephone Encounter (Signed)
 Not sure, may be because flexeril is already on med list (ok to refill if she is not taking that) Also ? If not covered I am in car headed out of town - would someone please check in with her ? If not on flexeril can refill it times one Thanks

## 2023-12-16 NOTE — Telephone Encounter (Signed)
 Called and spoke with patient, she is not currently on flexeril. She is requesting refill on methocarbamol for now. She pays out of pocket as it is not covered. Sent in for 90 day supply.

## 2023-12-19 ENCOUNTER — Other Ambulatory Visit: Payer: Self-pay | Admitting: Family Medicine

## 2023-12-19 NOTE — Progress Notes (Signed)
 Took flexeril  of med list  Thanks

## 2023-12-22 ENCOUNTER — Ambulatory Visit: Admitting: Internal Medicine

## 2023-12-22 ENCOUNTER — Encounter: Payer: Self-pay | Admitting: Internal Medicine

## 2023-12-22 DIAGNOSIS — G4733 Obstructive sleep apnea (adult) (pediatric): Secondary | ICD-10-CM | POA: Diagnosis not present

## 2023-12-22 DIAGNOSIS — Z87891 Personal history of nicotine dependence: Secondary | ICD-10-CM

## 2023-12-22 DIAGNOSIS — J9611 Chronic respiratory failure with hypoxia: Secondary | ICD-10-CM

## 2023-12-22 DIAGNOSIS — D8685 Sarcoid myocarditis: Secondary | ICD-10-CM

## 2023-12-22 DIAGNOSIS — D869 Sarcoidosis, unspecified: Secondary | ICD-10-CM

## 2023-12-22 DIAGNOSIS — Z9981 Dependence on supplemental oxygen: Secondary | ICD-10-CM | POA: Diagnosis not present

## 2023-12-22 DIAGNOSIS — D860 Sarcoidosis of lung: Secondary | ICD-10-CM | POA: Diagnosis not present

## 2023-12-22 NOTE — Patient Instructions (Addendum)
 Excellent Job A+ [employer]!!  Continue CPAP as prescribed DME referral for new CPAP machine-CPAP 10  Patient Instructions Continue to use CPAP every night, minimum of 4-6 hours a night.  Change equipment every 30 days or as directed by DME.  Wash your tubing with warm soap and water daily, hang to dry. Wash humidifier portion weekly. Use bottled, distilled water and change daily   Be aware of reduced alertness and do not drive or operate heavy machinery if experiencing this or drowsiness.  Exercise encouraged, as tolerated. Encouraged proper weight management.  Important to get eight or more hours of sleep  Limiting the use of the computer and television before bedtime.  Decrease naps during the day, so night time sleep will become enhanced.  Limit caffeine, and sleep deprivation.    Avoid Allergens and Irritants Avoid secondhand smoke Avoid SICK contacts Recommend  Masking  when appropriate Recommend Keep up-to-date with vaccinations  Follow up with [facility] sarcoid team and Rheumotology

## 2023-12-22 NOTE — Progress Notes (Signed)
 Name: Judith [name] MRN: [id number] DOB: 1945-09-17     CONSULTATION DATE:12/22/2023  REFERRING MD :  Dr. Luna Salinas   STUDIES:  CT chest Jan 26, 2017 I have Independently reviewed images of  CT chest    Interpretation:B/l pulm nodules   Previous history 78 year old  abnormal CT chest with bilateral pulmonary nodules Patient has a diagnosis and history of sarcoidosis of the scalp In February 2018 patient developed a chronic cough patient was worked up for chest x-ray and a CT scan of the chest which revealed several pulmonary nodules Patient was then referred to cardiothoracic surgery for further evaluation Dr. 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 Patient sees ophthalmology on an annual basis to assess for sarcoid 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  test WNL ONO revelas signs hypoxia Placed on 2 L [place] oxygen  therapy  patient diagnosed with sleep apnea with AHI of 13 123 events Lowest O2 sat was 75%  CC Follow-up OSA assessment Follow-up sarcoid assessment Follow-up respiratory failure  HPI Assessment stage IV sarcoid Stable at this time Intermittent shortness of breath and dyspnea on exertion Diagnosis of cardiac sarcoid Follows up with [facility] cardiology Chronic immunosuppressive therapy with Humira  CARDIAC SARCOIDOSIS Dx at [facility] Therapy-chronic prednisone  transitioned to MTX therapy Patient will need prolonged  immunotherapy indefinitely  Chronic Hypoxic resp failure due to COPD -Patient benefits from oxygen  therapy 2L [place] at night -recommend using oxygen  as prescribed -patient needs this for survival   Assessment of OSA Patient uses and benefits from therapy Using CPAP nightly and with naps Pressure setting is comfortable and is sleeping well. Excellent compliance report Discussed with patient in detail Auto CPAP 5-13 100% compliance AHI reduced to less than 1   No exacerbation at this time No evidence of heart failure at this time No evidence or signs of infection at this time No respiratory distress No fevers, chills, nausea, vomiting, diarrhea No evidence of lower extremity edema No evidence hemoptysis      Allergies  Allergen Reactions   Niacin And Related Other (See Comments)    Caused black and blue marks all over body   Meloxicam Rash     There were no vitals taken for this visit.  Review of Systems: Gen:  Denies  fever, sweats, chills weight loss  HEENT: Denies blurred vision, double vision, ear pain, eye pain, hearing loss, nose bleeds, sore throat Cardiac:  No dizziness, chest pain or heaviness, chest tightness,edema, No JVD Resp:   No cough, -sputum production, -shortness of breath,-wheezing, -hemoptysis,  Other:  All other systems negative   Physical Examination:   General Appearance: No distress  EYES PERRLA, EOM intact.   NECK Supple, No JVD Pulmonary: normal breath sounds, No wheezing.  CardiovascularNormal S1,S2.  No m/r/g.   Abdomen: Benign, Soft, non-tender. 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 with nocturnal hypoxia related to underlying sleep apnea   Pulmonary cardiac sarcoid Patient on chronic  immunosuppressive therapy Follow-up with [facility] On Humira and methotrexate Follow-up to cardiology No exacerbation at this time No evidence of heart failure at this time No evidence or signs of infection at this time No respiratory distress No fevers, chills, nausea, vomiting, diarrhea No evidence of lower extremity edema No evidence hemoptysis    Sarcoidosis pulmonary stage IV Continue oxygen  as prescribed Continue immunosuppressive therapy patient seen at [facility]   Assessment of OSA Previous AHI 13 Continue CPAP as prescribed  Excellent compliance report Reviewed compliance report in detail with patient Patient definitely benefits the use of CPAP therapy as prescribed Using CPAP nightly and with naps Pressure setting is comfortable and is sleeping well. CPAP prescription 5-13 AHI reduced to  No evidence of acute heart failure at this time No respiratory distress No fevers, chills, nausea, vomiting, diarrhea No evidence hemoptysis  Patient Instructions Continue to use CPAP every night, minimum of 4-6 hours a night.  Change equipment every 30 days or as directed by DME.  Wash your tubing with warm soap and water daily, hang to dry. Wash humidifier portion weekly. Use bottled, distilled water and change daily   Be aware of reduced alertness and do not drive or operate heavy machinery if experiencing this or drowsiness.  Exercise encouraged, as tolerated. Encouraged proper weight management.  Important to get eight or more hours of sleep  Limiting the use of the computer and television before bedtime.  Decrease naps during the day, so night time sleep will become enhanced.  Limit caffeine, and sleep deprivation.  HTN, stroke, uncontrolled diabetes and heart failure are potential risk factors.  Risk of untreated sleep apnea including cardiac arrhthymias, stroke, DM, pulm HTN.    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 All questions answered  Follow up in 1 year  Total time spent 42 minutes   Lady Pier, M.D.  [facility]  Medical Director [facility] Medical Director [facility] Cardio-Pulmonary Department

## 2023-12-29 DIAGNOSIS — M25521 Pain in right elbow: Secondary | ICD-10-CM | POA: Diagnosis not present

## 2023-12-29 DIAGNOSIS — D8685 Sarcoid myocarditis: Secondary | ICD-10-CM | POA: Diagnosis not present

## 2023-12-29 DIAGNOSIS — M25561 Pain in right knee: Secondary | ICD-10-CM | POA: Diagnosis not present

## 2023-12-29 DIAGNOSIS — M2550 Pain in unspecified joint: Secondary | ICD-10-CM | POA: Diagnosis not present

## 2023-12-29 DIAGNOSIS — M25522 Pain in left elbow: Secondary | ICD-10-CM | POA: Diagnosis not present

## 2023-12-29 DIAGNOSIS — M25562 Pain in left knee: Secondary | ICD-10-CM | POA: Diagnosis not present

## 2023-12-29 DIAGNOSIS — Z79899 Other long term (current) drug therapy: Secondary | ICD-10-CM | POA: Diagnosis not present

## 2024-01-02 DIAGNOSIS — D869 Sarcoidosis, unspecified: Secondary | ICD-10-CM | POA: Diagnosis not present

## 2024-01-04 DIAGNOSIS — Z961 Presence of intraocular lens: Secondary | ICD-10-CM | POA: Diagnosis not present

## 2024-01-04 DIAGNOSIS — H01003 Unspecified blepharitis right eye, unspecified eyelid: Secondary | ICD-10-CM | POA: Diagnosis not present

## 2024-01-04 DIAGNOSIS — H26493 Other secondary cataract, bilateral: Secondary | ICD-10-CM | POA: Diagnosis not present

## 2024-01-04 DIAGNOSIS — H04203 Unspecified epiphora, bilateral lacrimal glands: Secondary | ICD-10-CM | POA: Diagnosis not present

## 2024-01-05 DIAGNOSIS — M797 Fibromyalgia: Secondary | ICD-10-CM | POA: Diagnosis not present

## 2024-01-05 DIAGNOSIS — M150 Primary generalized (osteo)arthritis: Secondary | ICD-10-CM | POA: Diagnosis not present

## 2024-01-05 DIAGNOSIS — D8685 Sarcoid myocarditis: Secondary | ICD-10-CM | POA: Diagnosis not present

## 2024-01-05 DIAGNOSIS — R12 Heartburn: Secondary | ICD-10-CM | POA: Diagnosis not present

## 2024-01-05 DIAGNOSIS — R197 Diarrhea, unspecified: Secondary | ICD-10-CM | POA: Diagnosis not present

## 2024-01-05 DIAGNOSIS — K449 Diaphragmatic hernia without obstruction or gangrene: Secondary | ICD-10-CM | POA: Diagnosis not present

## 2024-01-13 ENCOUNTER — Other Ambulatory Visit: Payer: Self-pay | Admitting: Family Medicine

## 2024-02-02 DIAGNOSIS — D869 Sarcoidosis, unspecified: Secondary | ICD-10-CM | POA: Diagnosis not present

## 2024-02-09 DIAGNOSIS — D8689 Sarcoidosis of other sites: Secondary | ICD-10-CM | POA: Diagnosis not present

## 2024-02-09 DIAGNOSIS — D8685 Sarcoid myocarditis: Secondary | ICD-10-CM | POA: Diagnosis not present

## 2024-02-10 DIAGNOSIS — G4733 Obstructive sleep apnea (adult) (pediatric): Secondary | ICD-10-CM | POA: Diagnosis not present

## 2024-02-13 ENCOUNTER — Other Ambulatory Visit: Payer: Self-pay | Admitting: Family Medicine

## 2024-02-14 ENCOUNTER — Ambulatory Visit (INDEPENDENT_AMBULATORY_CARE_PROVIDER_SITE_OTHER): Payer: Medicare HMO

## 2024-02-14 VITALS — BP 120/70 | Ht 59.5 in | Wt 154.0 lb

## 2024-02-14 DIAGNOSIS — Z0000 Encounter for general adult medical examination without abnormal findings: Secondary | ICD-10-CM

## 2024-02-14 DIAGNOSIS — Z2821 Immunization not carried out because of patient refusal: Secondary | ICD-10-CM

## 2024-02-14 NOTE — Telephone Encounter (Signed)
 CPE scheduled for 02/28/24  Last filled on 01/14/24 #90 tabs/ 0 refills

## 2024-02-14 NOTE — Patient Instructions (Signed)
 Ms. Adorno , Thank you for taking time out of your busy schedule to complete your Annual Wellness Visit with me. I enjoyed our conversation and look forward to speaking with you again next year. I, as well as your care team,  appreciate your ongoing commitment to your health goals. Please review the following plan we discussed and let me know if I can assist you in the future. Your Game plan/ To Do List    Referrals: If you haven't heard from the office you've been referred to, please reach out to them at the phone provided.  none Follow up Visits: Next Medicare AWV with our clinical staff: 02/15/2025   Have you seen your provider in the last 6 months (3 months if uncontrolled diabetes)? No Next Office Visit with your provider: 02/28/2024  Clinician Recommendations:  Aim for 30 minutes of exercise or brisk walking, 6-8 glasses of water, and 5 servings of fruits and vegetables each day.       This is a list of the screening recommended for you and due dates:  Health Maintenance  Topic Date Due   Zoster (Shingles) Vaccine (1 of 2) Never done   DTaP/Tdap/Td vaccine (3 - Td or Tdap) 03/19/2021   COVID-19 Vaccine (7 - Pfizer risk 2024-25 season) 11/18/2027*   Flu Shot  03/31/2024   Mammogram  07/04/2024   Medicare Annual Wellness Visit  02/13/2025   Pneumococcal Vaccine for age over 6  Completed   DEXA scan (bone density measurement)  Completed   Hepatitis C Screening  Completed   HPV Vaccine  Aged Out   Meningitis B Vaccine  Aged Out   Colon Cancer Screening  Discontinued  *Topic was postponed. The date shown is not the original due date.    Advanced directives: (Declined) Advance directive discussed with you today. Even though you declined this today, please call our office should you change your mind, and we can give you the proper paperwork for you to fill out. Advance Care Planning is important because it:  [x]  Makes sure you receive the medical care that is consistent with your  values, goals, and preferences  [x]  It provides guidance to your family and loved ones and reduces their decisional burden about whether or not they are making the right decisions based on your wishes.  Follow the link provided in your after visit summary or read over the paperwork we have mailed to you to help you started getting your Advance Directives in place. If you need assistance in completing these, please reach out to us  so that we can help you!  See attachments for Preventive Care and Fall Prevention Tips.

## 2024-02-14 NOTE — Progress Notes (Signed)
 Because this visit was a virtual/telehealth visit,  certain criteria was not obtained, such a blood pressure, CBG if applicable, and timed get up and go. Any medications not marked as taking were not mentioned during the medication reconciliation part of the visit. Any vitals not documented were not able to be obtained due to this being a telehealth visit or patient was unable to self-report a recent blood pressure reading due to a lack of equipment at home via telehealth. Vitals that have been documented are verbally provided by the patient.   This visit was performed by a medical professional under my direct supervision. I was immediately available for consultation/collaboration. I have reviewed and agree with the Annual Wellness Visit documentation.  Subjective:   Judith [name] is a 78 y.o. who presents for a Medicare Wellness preventive visit.  As a reminder, Annual Wellness Visits don't include a physical exam, and some assessments may be limited, especially if this visit is performed virtually. We may recommend an in-person follow-up visit with your provider if needed.  Visit Complete: Virtual I connected with  [name] on 02/14/24 by a audio enabled telemedicine application and verified that I am speaking with the correct person using two identifiers.  Patient Location: Home  Provider Location: Home Office  I discussed the limitations of evaluation and management by telemedicine. The patient expressed understanding and agreed to proceed.  Vital Signs: Because this visit was a virtual/telehealth visit, some criteria may be missing or patient reported. Any vitals not documented were not able to be obtained and vitals that have been documented are patient reported.  VideoDeclined- This patient declined Librarian, academic. Therefore the visit was completed with audio only.  Persons Participating in Visit: Patient.  AWV Questionnaire: Yes:  Patient Medicare AWV questionnaire was completed by the patient on 02/13/2024; I have confirmed that all information answered by patient is correct and no changes since this date.  Cardiac Risk Factors include: advanced age (>53men, >33 women);obesity (BMI >30kg/m2);dyslipidemia     Objective:    Today's Vitals   02/14/24 1322  BP: 120/70  Weight: 154 lb (69.9 kg)  Height: 4' 11.5 (1.511 m)   Body mass index is 30.58 kg/m.     02/14/2024    1:21 PM 02/10/2023    1:45 PM 05/13/2022    7:00 AM 04/29/2022   11:07 AM 02/20/2022    3:06 PM 01/19/2020   10:34 AM 01/18/2019    9:45 AM  Advanced Directives  Does Patient Have a Medical Advance Directive? Yes Yes Yes Yes Yes Yes Yes  Type of Estate agent of [place];Living will Healthcare Power of [place];Living will Healthcare Power of [place];Living will Healthcare Power of [place];Living will Healthcare Power of [place];Living will Healthcare Power of [place];Living will Healthcare Power of [place];Living will  Does patient want to make changes to medical advance directive? No - Patient declined  No - Patient declined No - Patient declined     Copy of Healthcare Power of Attorney in Chart? No - copy requested No - copy requested No - copy requested No - copy requested No - copy requested No - copy requested No - copy requested      Data saved with a previous flowsheet row definition    Current Medications (verified) Outpatient Encounter Medications as of 02/14/2024  Medication Sig   acetaminophen  (TYLENOL ) 500 MG tablet Take 500 mg by mouth every 6 (six) hours as needed.   Adalimumab 40 MG/0.4ML PNKT  Inject into the skin.   benzonatate  (TESSALON ) 200 MG capsule Take 1 capsule (200 mg total) by mouth 3 (three) times daily as needed.   Biotin 5 MG TABS Take by mouth daily.   calcium-vitamin D  (OSCAL WITH D) 500-200 MG-UNIT per tablet Take 1 tablet by mouth once a week.    cholecalciferol (VITAMIN D3) 25 MCG (1000  UNIT) tablet Take 1,000 Units by mouth daily.   DULoxetine (CYMBALTA) 60 MG capsule Take 60 mg by mouth daily.   erythromycin ophthalmic ointment 1 Application daily as needed.   FIBER SELECT GUMMIES PO Take 2 tablets by mouth at bedtime.   fish oil-omega-3 fatty acids 1000 MG capsule Take 1 g by mouth once a week.   FOLIC ACID  PO Take 1 tablet by mouth daily.   methotrexate (RHEUMATREX) 2.5 MG tablet Take 20 mg by mouth once a week.   metoprolol succinate (TOPROL-XL) 25 MG 24 hr tablet    Multiple Vitamin (MULTIVITAMIN WITH MINERALS) TABS tablet Take 1 tablet by mouth at bedtime.   OXYGEN  Inhale into the lungs at bedtime. 2.5 - 3.0 L   pantoprazole (PROTONIX) 40 MG tablet Take 40 mg by mouth.   Polyethyl Glycol-Propyl Glycol (SYSTANE) 0.4-0.3 % SOLN Apply to eye daily as needed.   vitamin B-12 (CYANOCOBALAMIN ) 500 MCG tablet Take 500 mcg by mouth 2 (two) times a week.   [DISCONTINUED] methocarbamol  (ROBAXIN ) 500 MG tablet TAKE 1 TABLET BY MOUTH EVERY 8 HOURS AS NEEDED FOR MUSCLE SPASMS   No facility-administered encounter medications on file as of 02/14/2024.    Allergies (verified) Niacin and related, Pollen extract, and Meloxicam   History: Past Medical History:  Diagnosis Date   Allergic rhinitis    Allergy    Arthritis    Atrophic vaginitis    Breast cancer (HCC)    Right mastectomy   Cataract 2020   Diverticulosis    GERD (gastroesophageal reflux disease)    H/O: pneumonia    as child-viral pneumonia   Headache    History of shingles    HLD (hyperlipidemia)    pt denies   IBS (irritable bowel syndrome)    Implantable loop recorder present    OSA (obstructive sleep apnea) 06/20/2020   CPAP   Osteopenia    Oxygen  deficiency at night   Sarcoidosis    Sleep apnea 2018 use CPAP   Sliding hiatal hernia    Past Surgical History:  Procedure Laterality Date   APPENDECTOMY     BASAL CELL CARCINOMA EXCISION     scalp   BASAL CELL CARCINOMA EXCISION  08/2016   nose    BREAST SURGERY     CATARACT EXTRACTION W/PHACO Right 04/29/2022   Procedure: CATARACT EXTRACTION PHACO AND INTRAOCULAR LENS PLACEMENT (IOC) RIGHT VIVITY LENS 10.90 01:10.5;  Surgeon: Annell Kidney, MD;  Location[facility];  Service: Ophthalmology;  Laterality: Right;  sleep apnea   CATARACT EXTRACTION W/PHACO Left 05/13/2022   Procedure: CATARACT EXTRACTION PHACO AND INTRAOCULAR LENS PLACEMENT (IOC) LEFT VIVITY LENS 8.84 01:10.1;  Surgeon: Annell Kidney, MD;  Location: [facility];  Service: Ophthalmology;  Laterality: Left;  sleep apnea   COLONOSCOPY  02/2010   divertics, rpt 5 years   DEXA  2005   osteopenia, no change from 2003   DILATION AND CURETTAGE OF UTERUS  10/1999   EYE SURGERY  lasik   LAPAROSCOPIC TOTAL HYSTERECTOMY  05/2002   for fibroids/polyp   MASTECTOMY Right 02/2009   right breast cancer  MOHS SURGERY  04/17/2022   Nose   POLYPECTOMY     TOTAL ABDOMINAL HYSTERECTOMY  2003   for fibroids/polyps   TUBAL LIGATION  1977   VIDEO BRONCHOSCOPY WITH ENDOBRONCHIAL NAVIGATION N/A 02/01/2017   Procedure: VIDEO BRONCHOSCOPY WITH ENDOBRONCHIAL NAVIGATION;  Surgeon: Zelphia Higashi, MD;  Location: [facility];  Service: Thoracic;  Laterality: N/A;   Family History  Problem Relation Age of Onset   Prostate cancer Father    Prostate cancer Brother    Lymphoma Brother    Heart attack Paternal Grandmother    Stomach cancer Maternal Grandfather    Colon cancer Neg Hx    Rectal cancer Neg Hx    Esophageal cancer Neg Hx    Social History   Socioeconomic History   Marital status: Married    Spouse name: Not on file   Number of children: 2   Years of education: Not on file   Highest education level: Bachelor's degree (e.g., BA, AB, BS)  Occupational History   Occupation: Retired  Tobacco Use   Smoking status: Former    Current packs/day: 0.00    Average packs/day: 0.5 packs/day for 5.0 years (2.5 ttl pk-yrs)    Types: Cigarettes    Start  date: 9    Quit date: 08/31/1968    Years since quitting: 55.4   Smokeless tobacco: Never  Vaping Use   Vaping status: Never Used  Substance and Sexual Activity   Alcohol use: Not Currently    Comment: occasional glass of wine   Drug use: No   Sexual activity: Yes    Birth control/protection: Post-menopausal    Comment: hysterectomy  Other Topics Concern   Not on file  Social History Narrative   Married      2 sons      Retired      2 cups coffee/daily      Gym 3-4 times/week   Social Drivers of Corporate investment banker Strain: Low Risk  (02/13/2024)   Overall Financial Resource Strain (CARDIA)    Difficulty of Paying Living Expenses: Not hard at all  Food Insecurity: No Food Insecurity (02/13/2024)   Hunger Vital Sign    Worried About Running Out of Food in the Last Year: Never true    Ran Out of Food in the Last Year: Never true  Transportation Needs: No Transportation Needs (02/13/2024)   PRAPARE - Administrator, Civil Service (Medical): No    Lack of Transportation (Non-Medical): No  Physical Activity: Insufficiently Active (02/13/2024)   Exercise Vital Sign    Days of Exercise per Week: 1 day    Minutes of Exercise per Session: 20 min  Stress: No Stress Concern Present (02/13/2024)   [employer] of Occupational Health - Occupational Stress Questionnaire    Feeling of Stress: Only a little  Social Connections: Moderately Integrated (02/13/2024)   Social Connection and Isolation Panel    Frequency of Communication with Friends and Family: Twice a week    Frequency of Social Gatherings with Friends and Family: Once a week    Attends Religious Services: Never    Database administrator or Organizations: Yes    Attends Engineer, structural: More than 4 times per year    Marital Status: Married    Tobacco Counseling Counseling given: Not Answered    Clinical Intake:  Pre-visit preparation completed: Yes  Pain : No/denies pain      BMI - recorded: 30.58 Nutritional Status:  BMI > 30  Obese Nutritional Risks: None  No results found for: HGBA1C   How often do you need to have someone help you when you read instructions, pamphlets, or other written materials from your doctor or pharmacy?: 1 - Never What is the last grade level you completed in school?: college degree  Interpreter Needed?: No  Information entered by :: Juliann Ochoa   Activities of Daily Living     02/13/2024    7:20 PM  In your present state of health, do you have any difficulty performing the following activities:  Hearing? 0  Vision? 0  Difficulty concentrating or making decisions? 0  Walking or climbing stairs? 1  Dressing or bathing? 0  Doing errands, shopping? 0  Preparing Food and eating ? N  Using the Toilet? N  In the past six months, have you accidently leaked urine? Y  Do you have problems with loss of bowel control? N  Managing your Medications? N  Managing your Finances? N  Housekeeping or managing your Housekeeping? Y    Patient Care Team: Tower, Manley Seeds, MD as PCP - General Isenstein, Arin L, MD as Consulting Physician (Dermatology)  I have updated your Care Teams any recent Medical Services you may have received from other providers in the past year.     Assessment:   This is a routine wellness examination for Azaya .  Hearing/Vision screen Hearing Screening - Comments:: No hearing difficulties  Vision Screening - Comments:: Patient has had cataract surgery glasses for reading   Goals Addressed             This Visit's Progress    Patient Stated   On track    02/20/2022, wants to be able to walk and see       Depression Screen     02/14/2024    1:27 PM 11/02/2023    3:47 PM 07/26/2023   10:05 AM 02/10/2023    1:45 PM 02/23/2022    2:24 PM 02/20/2022    3:07 PM 02/19/2021    9:09 AM  PHQ 2/9 Scores  PHQ - 2 Score 0 1 1 0 0 0 0  PHQ- 9 Score 2 5 6    3     Fall Risk     02/13/2024     7:20 PM 11/02/2023    3:47 PM 07/26/2023   10:04 AM 02/10/2023    1:46 PM 02/09/2023    5:51 PM  Fall Risk   Falls in the past year? 0 0 0 0 0  Number falls in past yr: 0 0 0 0   Injury with Fall? 0 0 0 0   Risk for fall due to : No Fall Risks No Fall Risks No Fall Risks No Fall Risks   Follow up Falls evaluation completed Falls evaluation completed Falls evaluation completed;Education provided Falls prevention discussed;Falls evaluation completed     MEDICARE RISK AT HOME:  Medicare Risk at Home Any stairs in or around the home?: (Patient-Rptd) Yes If so, are there any without handrails?: (Patient-Rptd) No Home free of loose throw rugs in walkways, pet beds, electrical cords, etc?: (Patient-Rptd) Yes Adequate lighting in your home to reduce risk of falls?: (Patient-Rptd) Yes Life alert?: (Patient-Rptd) No Use of a cane, walker or w/c?: (Patient-Rptd) No Grab bars in the bathroom?: (Patient-Rptd) Yes Shower chair or bench in shower?: (Patient-Rptd) No Elevated toilet seat or a handicapped toilet?: (Patient-Rptd) Yes  TIMED UP AND GO:  Was the test performed?  No  Cognitive Function: 6CIT completed    01/19/2020   10:40 AM 01/18/2019    9:28 AM 12/24/2017    9:54 AM 12/23/2016   10:21 AM 12/12/2015    9:46 AM  MMSE - Mini Mental State Exam  Orientation to time 5 5 5 5  5    Orientation to Place 5 5 5 5  5    Registration 3 3 3 3  3    Attention/ Calculation 5 0 0 0  0   Recall 3 3 3 3  3    Language- name 2 objects  0 0 0  0   Language- repeat 1 1 1 1 1   Language- follow 3 step command  0 3 3  3    Language- read & follow direction  0 0 0  0   Write a sentence  0 0 0  0   Copy design  0 0 0  0   Total score  17 20 20  20       Data saved with a previous flowsheet row definition        02/14/2024    1:25 PM 02/10/2023    1:47 PM 02/20/2022    3:09 PM  6CIT Screen  What Year? 0 points 0 points 0 points  What month? 0 points 0 points 0 points  What time? 0 points 0 points 0  points  Count back from 20 0 points 0 points 0 points  Months in reverse 0 points 0 points 0 points  Repeat phrase 0 points 0 points 0 points  Total Score 0 points 0 points 0 points    Immunizations Immunization History  Administered Date(s) Administered   Fluad Quad(high Dose 65+) 06/20/2019, 05/29/2020, 06/11/2022   Influenza Split 06/24/2011, 06/08/2012   Influenza Whole 07/08/2007, 06/11/2008, 06/19/2009, 06/04/2010   Influenza,inj,Quad PF,6+ Mos 06/15/2013, 06/21/2014, 07/31/2015, 06/10/2016, 06/15/2017, 05/25/2018   Influenza,inj,quad, With Preservative 06/29/2017   Influenza-Unspecified 06/08/2023   PFIZER Comirnaty(Gray Top)Covid-19 Tri-Sucrose Vaccine 02/04/2021   PFIZER(Purple Top)SARS-COV-2 Vaccination 10/05/2019, 10/30/2019, 04/30/2020   Pfizer(Comirnaty)Fall Seasonal Vaccine 12 years and older 06/08/2022, 06/08/2023   Pneumococcal Conjugate-13 12/05/2014   Pneumococcal Polysaccharide-23 03/20/2011   Td 06/06/2001   Tdap 03/20/2011    Screening Tests Health Maintenance  Topic Date Due   Zoster Vaccines- Shingrix (1 of 2) Never done   DTaP/Tdap/Td (3 - Td or Tdap) 03/19/2021   COVID-19 Vaccine (7 - Pfizer risk 2024-25 season) 11/18/2027 (Originally 12/07/2023)   INFLUENZA VACCINE  03/31/2024   MAMMOGRAM  07/04/2024   Medicare Annual Wellness (AWV)  02/13/2025   Pneumococcal Vaccine: 50+ Years  Completed   DEXA SCAN  Completed   Hepatitis C Screening  Completed   HPV VACCINES  Aged Out   Meningococcal B Vaccine  Aged Out   Colonoscopy  Discontinued    Health Maintenance  Health Maintenance Due  Topic Date Due   Zoster Vaccines- Shingrix (1 of 2) Never done   DTaP/Tdap/Td (3 - Td or Tdap) 03/19/2021   Health Maintenance Items Addressed: Patient declined   Additional Screening:  Vision Screening: Recommended annual ophthalmology exams for early detection of glaucoma and other disorders of the eye. Would you like a referral to an eye doctor? No    Dental  Screening: Recommended annual dental exams for proper oral hygiene  Community Resource Referral / Chronic Care Management: CRR required this visit?  No   CCM required this visit?  No   Plan:    I have personally reviewed and noted the following  in the patient's chart:   Medical and social history Use of alcohol, tobacco or illicit drugs  Current medications and supplements including opioid prescriptions. Patient is not currently taking opioid prescriptions. Functional ability and status Nutritional status Physical activity Advanced directives List of other physicians Hospitalizations, surgeries, and ER visits in previous 12 months Vitals Screenings to include cognitive, depression, and falls Referrals and appointments  In addition, I have reviewed and discussed with patient certain preventive protocols, quality metrics, and best practice recommendations. A written personalized care plan for preventive services as well as general preventive health recommendations were provided to patient.   Freeda Jerry, [place]   02/14/2024   After Visit Summary: (MyChart) Due to this being a telephonic visit, the after visit summary with patients personalized plan was offered to patient via MyChart   Notes: Nothing significant to report at this time.

## 2024-02-23 DIAGNOSIS — M48061 Spinal stenosis, lumbar region without neurogenic claudication: Secondary | ICD-10-CM | POA: Diagnosis not present

## 2024-02-23 DIAGNOSIS — M4326 Fusion of spine, lumbar region: Secondary | ICD-10-CM | POA: Diagnosis not present

## 2024-02-23 DIAGNOSIS — M4316 Spondylolisthesis, lumbar region: Secondary | ICD-10-CM | POA: Diagnosis not present

## 2024-02-23 DIAGNOSIS — Z981 Arthrodesis status: Secondary | ICD-10-CM | POA: Diagnosis not present

## 2024-02-28 ENCOUNTER — Encounter: Payer: Self-pay | Admitting: Family Medicine

## 2024-02-28 ENCOUNTER — Ambulatory Visit (INDEPENDENT_AMBULATORY_CARE_PROVIDER_SITE_OTHER): Admitting: Family Medicine

## 2024-02-28 VITALS — BP 126/72 | HR 72 | Temp 98.8°F | Ht 60.0 in | Wt 156.5 lb

## 2024-02-28 DIAGNOSIS — M7918 Myalgia, other site: Secondary | ICD-10-CM | POA: Diagnosis not present

## 2024-02-28 DIAGNOSIS — K760 Fatty (change of) liver, not elsewhere classified: Secondary | ICD-10-CM

## 2024-02-28 DIAGNOSIS — Z79899 Other long term (current) drug therapy: Secondary | ICD-10-CM | POA: Insufficient documentation

## 2024-02-28 DIAGNOSIS — O26811 Pregnancy related exhaustion and fatigue, first trimester: Secondary | ICD-10-CM | POA: Insufficient documentation

## 2024-02-28 DIAGNOSIS — R5382 Chronic fatigue, unspecified: Secondary | ICD-10-CM

## 2024-02-28 DIAGNOSIS — G4733 Obstructive sleep apnea (adult) (pediatric): Secondary | ICD-10-CM

## 2024-02-28 DIAGNOSIS — E66811 Obesity, class 1: Secondary | ICD-10-CM | POA: Insufficient documentation

## 2024-02-28 DIAGNOSIS — M8589 Other specified disorders of bone density and structure, multiple sites: Secondary | ICD-10-CM

## 2024-02-28 DIAGNOSIS — J9611 Chronic respiratory failure with hypoxia: Secondary | ICD-10-CM

## 2024-02-28 DIAGNOSIS — K219 Gastro-esophageal reflux disease without esophagitis: Secondary | ICD-10-CM

## 2024-02-28 DIAGNOSIS — E7800 Pure hypercholesterolemia, unspecified: Secondary | ICD-10-CM

## 2024-02-28 DIAGNOSIS — R5383 Other fatigue: Secondary | ICD-10-CM | POA: Insufficient documentation

## 2024-02-28 DIAGNOSIS — Z131 Encounter for screening for diabetes mellitus: Secondary | ICD-10-CM | POA: Insufficient documentation

## 2024-02-28 DIAGNOSIS — Z0000 Encounter for general adult medical examination without abnormal findings: Secondary | ICD-10-CM | POA: Diagnosis not present

## 2024-02-28 NOTE — Assessment & Plan Note (Signed)
 Reviewed rheum/ ortho notes  Occurred after her back surgery  Overall some improvement   Taking cymbalta - would like to wean off in future  Discussed option of water exercise to help chronic pain

## 2024-02-28 NOTE — Assessment & Plan Note (Signed)
 Dexa 07/2023  No falls or fracture Discussed fall prevention, supplements and exercise for bone density   Exercise is challenging with chronic pain

## 2024-02-28 NOTE — Progress Notes (Signed)
 Subjective:    Patient ID: Judith [name], female    DOB: May 07, 1946, 78 y.o.   MRN: [id number]  HPI  Here for health maintenance exam and to review chronic medical problems   Wt Readings from Last 3 Encounters:  02/28/24 156 lb 8 oz (71 kg)  02/14/24 154 lb (69.9 kg)  11/02/23 152 lb (68.9 kg)   30.56 kg/m  Vitals:   02/28/24 1512  BP: 126/72  Pulse: 72  Temp: 98.8 F (37.1 C)  SpO2: 95%    Immunization History  Administered Date(s) Administered   Fluad Quad(high Dose 65+) 06/20/2019, 05/29/2020, 06/11/2022   Influenza Split 06/24/2011, 06/08/2012   Influenza Whole 07/08/2007, 06/11/2008, 06/19/2009, 06/04/2010   Influenza,inj,Quad PF,6+ Mos 06/15/2013, 06/21/2014, 07/31/2015, 06/10/2016, 06/15/2017, 05/25/2018   Influenza,inj,quad, With Preservative 06/29/2017   Influenza-Unspecified 06/08/2023   PFIZER Comirnaty(Gray Top)Covid-19 Tri-Sucrose Vaccine 02/04/2021   PFIZER(Purple Top)SARS-COV-2 Vaccination 10/05/2019, 10/30/2019, 04/30/2020   Pfizer(Comirnaty)Fall Seasonal Vaccine 12 years and older 06/08/2022, 06/08/2023   Pneumococcal Conjugate-13 12/05/2014   Pneumococcal Polysaccharide-23 03/20/2011   Td 06/06/2001   Tdap 03/20/2011    There are no preventive care reminders to display for this patient.  A little tired overall  Breathing is better  Heart is better also    Last Tetanus shot was 03/2011   Shingrix -interested   Had lumbar fusion last August  Doing ok overall  It gets stiff  Takes methocarbamol     Mammogram 07/2023  Personal history of breast cancer  Self breast exam no lumps   Gyn health No problems     Colon cancer screening -colonoscopy 07/2015  Positive cologuard 2022 - unable to do colonoscopy due to tortuous colon  History of polyps  No recall due to age  History of diverticulitis  Diarrhea is improved after 3-4 mo post antibiotics (lost weight and gained it back)    Bone health  Dexa 07/2023  osteopenia   Falls-none  Fractures-none ca plus D Supplements  Last vitamin D  Lab Results  Component Value Date   VD25OH 35.53 12/12/2015    Exercise  Still hard for her due to fatigue Trying to increase her daily steps  Weights hurt her right elbow -chronic     Mood    02/28/2024    3:25 PM 02/14/2024    1:27 PM 11/02/2023    3:47 PM 07/26/2023   10:05 AM 02/10/2023    1:45 PM  Depression screen PHQ 2/9  Decreased Interest 0 0 1 1 0  Down, Depressed, Hopeless 0 0 0 0 0  PHQ - 2 Score 0 0 1 1 0  Altered sleeping 3 1 0 1   Tired, decreased energy 2 0 2 2   Change in appetite 1 1 2 1    Feeling bad or failure about yourself  0 0 0 0   Trouble concentrating 0 0 0 1   Moving slowly or fidgety/restless 0 0 0 0   Suicidal thoughts 0 0 0 0   PHQ-9 Score 6 2 5 6    Difficult doing work/chores Somewhat difficult Not difficult at all Not difficult at all Somewhat difficult    Cymbalta 60 mg daily (gives her insomnia)  She thinks it gives her trigger finger (she looked it up on internet) - may come off of it 2-3 months  Suspected fibromyalgia / chronic pain and fatigue  This got bad after her back surgery   Arthritis in knees and hands  Dupuytren's as well    History of  sarcoid (cardiac /pulmonary)  Takes methotrexate Adalimumab    Has pulmonary care Also OSA with cpap   Metoprolol xl 25 mg  BP Readings from Last 3 Encounters:  02/28/24 126/72  02/14/24 120/70  11/02/23 120/70   Pulse Readings from Last 3 Encounters:  02/28/24 72  11/02/23 76  10/14/23 70      Fatty liver Lab Results  Component Value Date   ALT 26 07/26/2023   AST 28 07/26/2023   ALKPHOS 77 07/26/2023   BILITOT 0.5 07/26/2023    Lab Results  Component Value Date   NA 142 07/26/2023   K 4.5 07/26/2023   CO2 22 07/26/2023   GLUCOSE 113 (H) 07/26/2023   BUN 17 07/26/2023   CREATININE 0.82 07/26/2023   CALCIUM 9.5 07/26/2023   GFR 68.51 07/26/2023   GFRNONAA >60 06/20/2020   GERD Had EGD  12/2023 -found hiatal hernia  Protonix 40 mg daily  Sees GI    Lab Results  Component Value Date   VITAMINB12 796 08/30/2019   Takes 1000 mcg B12 weekly oral  Also folic acid    Hyperlipidemia Lab Results  Component Value Date   CHOL 196 02/18/2023   HDL 60.90 02/18/2023   LDLCALC 114 (H) 02/18/2023   LDLDIRECT 154.0 03/13/2011   TRIG 107.0 02/18/2023   CHOLHDL 3 02/18/2023  Diet controlled    Cmet done in April from rhuem  GFR was 75  Cbc  April  Stable         Patient Active Problem List   Diagnosis Date Noted   Current use of proton pump inhibitor 02/28/2024   Class 1 obesity 02/28/2024   Diabetes mellitus screening 02/28/2024   Myofascial pain syndrome 02/28/2024   Fatigue 02/28/2024   Knee pain, bilateral 08/24/2023   Olecranon bursitis of right elbow 08/24/2023   Positive colorectal cancer screening using Cologuard test 02/23/2022   Chronic respiratory failure with hypoxia (HCC) 06/20/2020   OSA (obstructive sleep apnea) 06/20/2020   Degeneration of thoracic intervertebral disc 06/17/2018   Encounter for screening mammogram for breast cancer 01/07/2018   Dyspnea 10/25/2017   Sinus tachycardia 10/25/2017   Multiple lung nodules on CT 10/23/2017   Spondylolisthesis, lumbar region 10/14/2017   Routine general medical examination at a health care facility 01/06/2016   IBS (irritable colon syndrome) 05/31/2015   Colon polyps 05/31/2015   Estrogen deficiency 12/05/2014   Personal history of other malignant neoplasm of skin 09/21/2013   Sarcoidosis 08/09/2012   Mixed incontinence urge and stress 03/16/2012   Alopecia 11/14/2010   GERD 01/24/2009   NEOPLASM, MALIGNANT, BREAST, HX OF 01/02/2009   Hyperlipidemia 11/17/2007   Osteopenia 11/17/2007   Tinnitus 09/07/2007   Allergic rhinitis 09/07/2007   Diverticulosis of colon 09/07/2007   Fatty liver 09/07/2007   History of diverticulitis 08/12/2007   Past Medical History:  Diagnosis Date   Allergic  rhinitis    Allergy    Arthritis    Atrophic vaginitis    Breast cancer (HCC)    Right mastectomy   Cataract 2020   Diverticulosis    GERD (gastroesophageal reflux disease)    H/O: pneumonia    as child-viral pneumonia   Headache    History of shingles    HLD (hyperlipidemia)    pt denies   IBS (irritable bowel syndrome)    Implantable loop recorder present    OSA (obstructive sleep apnea) 06/20/2020   CPAP   Osteopenia    Oxygen  deficiency at night   Sarcoidosis  Sleep apnea 2018 use CPAP   Sliding hiatal hernia    Past Surgical History:  Procedure Laterality Date   APPENDECTOMY     BASAL CELL CARCINOMA EXCISION     scalp   BASAL CELL CARCINOMA EXCISION  08/2016   nose   BREAST SURGERY     CATARACT EXTRACTION W/PHACO Right 04/29/2022   Procedure: CATARACT EXTRACTION PHACO AND INTRAOCULAR LENS PLACEMENT (IOC) RIGHT VIVITY LENS 10.90 01:10.5;  Surgeon: Mittie Gaskin, MD;  Location: [facility];  Service: Ophthalmology;  Laterality: Right;  sleep apnea   CATARACT EXTRACTION W/PHACO Left 05/13/2022   Procedure: CATARACT EXTRACTION PHACO AND INTRAOCULAR LENS PLACEMENT (IOC) LEFT VIVITY LENS 8.84 01:10.1;  Surgeon: Mittie Gaskin, MD;  Location: [facility];  Service: Ophthalmology;  Laterality: Left;  sleep apnea   COLONOSCOPY  02/2010   divertics, rpt 5 years   DEXA  2005   osteopenia, no change from 2003   DILATION AND CURETTAGE OF UTERUS  10/1999   EYE SURGERY  lasik   cataract   LAPAROSCOPIC TOTAL HYSTERECTOMY  05/2002   for fibroids/polyp   MASTECTOMY Right 02/2009   right breast cancer   MOHS SURGERY  04/17/2022   Nose   POLYPECTOMY     SPINE SURGERY     TOTAL ABDOMINAL HYSTERECTOMY  2003   for fibroids/polyps   TUBAL LIGATION  1977   VIDEO BRONCHOSCOPY WITH ENDOBRONCHIAL NAVIGATION N/A 02/01/2017   Procedure: VIDEO BRONCHOSCOPY WITH ENDOBRONCHIAL NAVIGATION;  Surgeon: Kerrin Elspeth BROCKS, MD;  Location: [facility];  Service:  Thoracic;  Laterality: N/A;   Social History   Tobacco Use   Smoking status: Former    Current packs/day: 0.00    Average packs/day: 0.5 packs/day for 5.0 years (2.5 ttl pk-yrs)    Types: Cigarettes    Start date: 76    Quit date: 08/31/1968    Years since quitting: 55.5   Smokeless tobacco: Never  Vaping Use   Vaping status: Never Used  Substance Use Topics   Alcohol use: Not Currently    Comment: occasional glass of wine   Drug use: No   Family History  Problem Relation Age of Onset   Prostate cancer Father    Prostate cancer Brother    Lymphoma Brother    Heart attack Paternal Grandmother    Stomach cancer Maternal Grandfather    Cancer Son    Colon cancer Neg Hx    Rectal cancer Neg Hx    Esophageal cancer Neg Hx    Allergies  Allergen Reactions   Niacin And Related Other (See Comments)    Caused black and blue marks all over body   Pollen Extract Cough    Itchy watery eyes, sneezing   Meloxicam Rash   Current Outpatient Medications on File Prior to Visit  Medication Sig Dispense Refill   acetaminophen  (TYLENOL ) 500 MG tablet Take 500 mg by mouth every 6 (six) hours as needed.     Adalimumab 40 MG/0.4ML PNKT Inject into the skin.     Biotin 5 MG TABS Take by mouth daily.     calcium-vitamin D  (OSCAL WITH D) 500-200 MG-UNIT per tablet Take 1 tablet by mouth once a week.      cholecalciferol (VITAMIN D3) 25 MCG (1000 UNIT) tablet Take 1,000 Units by mouth daily.     DULoxetine (CYMBALTA) 60 MG capsule Take 60 mg by mouth daily.     erythromycin ophthalmic ointment 1 Application daily as needed.     FIBER  SELECT GUMMIES PO Take 2 tablets by mouth at bedtime.     FOLIC ACID  PO Take 1 tablet by mouth daily.     methocarbamol  (ROBAXIN ) 500 MG tablet TAKE 1 TABLET BY MOUTH EVERY 8 HOURS AS NEEDED FOR MUSCLE SPASMS 90 tablet 0   methotrexate (RHEUMATREX) 2.5 MG tablet Take 20 mg by mouth once a week.     metoprolol succinate (TOPROL-XL) 25 MG 24 hr tablet       Multiple Vitamin (MULTIVITAMIN WITH MINERALS) TABS tablet Take 1 tablet by mouth at bedtime.     OXYGEN  Inhale into the lungs at bedtime. 2.5 - 3.0 L     pantoprazole (PROTONIX) 40 MG tablet Take 40 mg by mouth.     Polyethyl Glycol-Propyl Glycol (SYSTANE) 0.4-0.3 % SOLN Apply to eye daily as needed.     vitamin B-12 (CYANOCOBALAMIN ) 500 MCG tablet Take 500 mcg by mouth 2 (two) times a week.     No current facility-administered medications on file prior to visit.    Review of Systems  Constitutional:  Positive for fatigue. Negative for activity change, appetite change, fever and unexpected weight change.  HENT:  Negative for congestion, ear pain, rhinorrhea, sinus pressure and sore throat.   Eyes:  Negative for pain, redness and visual disturbance.  Respiratory:  Negative for cough, shortness of breath and wheezing.   Cardiovascular:  Negative for chest pain and palpitations.  Gastrointestinal:  Negative for abdominal pain, blood in stool, constipation and diarrhea.  Endocrine: Negative for polydipsia and polyuria.  Genitourinary:  Negative for dysuria, frequency and urgency.  Musculoskeletal:  Positive for arthralgias and myalgias. Negative for back pain.  Skin:  Negative for pallor and rash.  Allergic/Immunologic: Negative for environmental allergies.  Neurological:  Negative for dizziness, syncope and headaches.  Hematological:  Negative for adenopathy. Does not bruise/bleed easily.  Psychiatric/Behavioral:  Negative for decreased concentration and dysphoric mood. The patient is not nervous/anxious.        Objective:   Physical Exam Constitutional:      General: She is not in acute distress.    Appearance: Normal appearance. She is well-developed. She is obese. She is not ill-appearing or diaphoretic.  HENT:     Head: Normocephalic and atraumatic.     Right Ear: Tympanic membrane, ear canal and external ear normal.     Left Ear: Tympanic membrane, ear canal and external ear  normal.     Nose: Nose normal. No congestion.     Mouth/Throat:     Mouth: Mucous membranes are moist.     Pharynx: Oropharynx is clear. No posterior oropharyngeal erythema.   Eyes:     General: No scleral icterus.    Extraocular Movements: Extraocular movements intact.     Conjunctiva/sclera: Conjunctivae normal.     Pupils: Pupils are equal, round, and reactive to light.   Neck:     Thyroid : No thyromegaly.     Vascular: No carotid bruit or JVD.   Cardiovascular:     Rate and Rhythm: Normal rate and regular rhythm.     Pulses: Normal pulses.     Heart sounds: Normal heart sounds.     No gallop.  Pulmonary:     Effort: Pulmonary effort is normal. No respiratory distress.     Breath sounds: Normal breath sounds. No wheezing.     Comments: Good air exch Chest:     Chest wall: No tenderness.  Abdominal:     General: Bowel sounds are normal. There is  no distension or abdominal bruit.     Palpations: Abdomen is soft. There is no mass.     Tenderness: There is no abdominal tenderness.     Hernia: No hernia is present.  Genitourinary:    Comments: Breast exam left: No mass, nodules, thickening, tenderness, bulging, retraction, inflamation, nipple discharge or skin changes noted.  No axillary or clavicular LA.      Right mastectomy site is clear of lumps or skin changes   Musculoskeletal:        General: No tenderness. Normal range of motion.     Cervical back: Normal range of motion and neck supple. No rigidity. No muscular tenderness.     Right lower leg: No edema.     Left lower leg: No edema.     Comments: No kyphosis   Lymphadenopathy:     Cervical: No cervical adenopathy.   Skin:    General: Skin is warm and dry.     Coloration: Skin is not pale.     Findings: No erythema or rash.     Comments: Solar lentigines diffusely    Neurological:     Mental Status: She is alert. Mental status is at baseline.     Cranial Nerves: No cranial nerve deficit.     Motor: No  abnormal muscle tone.     Coordination: Coordination normal.     Gait: Gait normal.     Deep Tendon Reflexes: Reflexes are normal and symmetric. Reflexes normal.   Psychiatric:        Mood and Affect: Mood normal.        Cognition and Memory: Cognition and memory normal.           Assessment & Plan:   Problem List Items Addressed This Visit       Respiratory   OSA (obstructive sleep apnea)   Using cpap      Chronic respiratory failure with hypoxia (HCC)   From pulmonary sarcoidosis  Per pt breathing has improved recently         Digestive   GERD   Under GI care Reviewed recent EGD bx results Has HH Taking pantoprazole 40 mg daily      Fatty liver   Lab today  Encouraged weight loss with healthy diet and exercise as tolerated       Relevant Orders   Comprehensive metabolic panel with GFR     Musculoskeletal and Integument   Osteopenia   Dexa 07/2023  No falls or fracture Discussed fall prevention, supplements and exercise for bone density   Exercise is challenging with chronic pain           Myofascial pain syndrome   Reviewed rheum/ ortho notes  Occurred after her back surgery  Overall some improvement   Taking cymbalta - would like to wean off in future  Discussed option of water exercise to help chronic pain          Other   Routine general medical examination at a health care facility - Primary   Reviewed health habits including diet and exercise and skin cancer prevention Reviewed appropriate screening tests for age  Also reviewed health mt list, fam hx and immunization status , as well as social and family history   See HPI Labs reviewed and ordered Health Maintenance  Topic Date Due   DTaP/Tdap/Td vaccine (3 - Td or Tdap) 02/27/2025*   Zoster (Shingles) Vaccine (1 of 2) 05/30/2025*   COVID-19 Vaccine (7 - Pfizer risk  2024-25 season) 11/18/2027*   Flu Shot  03/31/2024   Mammogram  07/04/2024   Medicare Annual Wellness Visit   02/13/2025   Pneumococcal Vaccine for age over 66  Completed   DEXA scan (bone density measurement)  Completed   Hepatitis C Screening  Completed   Hepatitis B Vaccine  Aged Out   HPV Vaccine  Aged Out   Meningitis B Vaccine  Aged Out   Colon Cancer Screening  Discontinued  *Topic was postponed. The date shown is not the original due date.   Discussed need for Td , shingrix at pharmacy  Also recommend RSV vaccine in future  No falls or fractures Discussed fall prevention, supplements and exercise for bone density  PHQ 2 from fatigue / mood is good        Hyperlipidemia   Disc goals for lipids and reasons to control them Rev last labs with pt Rev low sat fat diet in detail  Diet controlled Labs today      Relevant Orders   TSH   Lipid panel   Comprehensive metabolic panel with GFR   Fatigue   In setting of auto immune dz/ sarcoid and myofascial pain disorder  Likely multi factorial   Reviewed last lab in care everywhere TSH added to lab today      Diabetes mellitus screening   A1c ordered   disc imp of low glycemic diet and wt loss to prevent DM2       Relevant Orders   Hemoglobin A1c   Current use of proton pump inhibitor   B12 added to labs  Under care of GI taking protonix 40 mg daily      Relevant Orders   Vitamin B12   Class 1 obesity   Discussed how this problem influences overall health and the risks it imposes  Reviewed plan for weight loss with lower calorie diet (via better food choices (lower glycemic and portion control) along with exercise building up to or more than 30 minutes 5 days per week including some aerobic activity and strength training   Exercise limited by chronic pain   DM screening today      Relevant Orders   Hemoglobin A1c

## 2024-02-28 NOTE — Assessment & Plan Note (Signed)
 Under GI care Reviewed recent EGD bx results Has HH Taking pantoprazole 40 mg daily

## 2024-02-28 NOTE — Assessment & Plan Note (Signed)
 From pulmonary sarcoidosis  Per pt breathing has improved recently

## 2024-02-28 NOTE — Assessment & Plan Note (Signed)
 In setting of auto immune dz/ sarcoid and myofascial pain disorder  Likely multi factorial   Reviewed last lab in care everywhere TSH added to lab today

## 2024-02-28 NOTE — Assessment & Plan Note (Signed)
 Using cpap

## 2024-02-28 NOTE — Assessment & Plan Note (Signed)
 Reviewed health habits including diet and exercise and skin cancer prevention Reviewed appropriate screening tests for age  Also reviewed health mt list, fam hx and immunization status , as well as social and family history   See HPI Labs reviewed and ordered Health Maintenance  Topic Date Due   DTaP/Tdap/Td vaccine (3 - Td or Tdap) 02/27/2025*   Zoster (Shingles) Vaccine (1 of 2) 05/30/2025*   COVID-19 Vaccine (7 - Pfizer risk 2024-25 season) 11/18/2027*   Flu Shot  03/31/2024   Mammogram  07/04/2024   Medicare Annual Wellness Visit  02/13/2025   Pneumococcal Vaccine for age over 16  Completed   DEXA scan (bone density measurement)  Completed   Hepatitis C Screening  Completed   Hepatitis B Vaccine  Aged Out   HPV Vaccine  Aged Out   Meningitis B Vaccine  Aged Out   Colon Cancer Screening  Discontinued  *Topic was postponed. The date shown is not the original due date.   Discussed need for Td , shingrix at pharmacy  Also recommend RSV vaccine in future  No falls or fractures Discussed fall prevention, supplements and exercise for bone density  PHQ 2 from fatigue / mood is good

## 2024-02-28 NOTE — Patient Instructions (Addendum)
 If you want to get shingrix vaccine and tetanus  Also RSV vaccine is a good idea   Stay active Add some strength training to your routine, this is important for bone and brain health and can reduce your risk of falls and help your body use insulin properly and regulate weight  Light weights, exercise bands , and internet videos are a good way to start  Yoga (chair or regular), machines , floor exercises or a gym with machines are also good options   Consider water exercise - it helps with fibromyalgia pain   Labs today   Take care of yourself

## 2024-02-28 NOTE — Assessment & Plan Note (Signed)
Disc goals for lipids and reasons to control them Rev last labs with pt Rev low sat fat diet in detail  Diet controlled  Labs today

## 2024-02-28 NOTE — Assessment & Plan Note (Signed)
 A1c ordered  disc imp of low glycemic diet and wt loss to prevent DM2

## 2024-02-28 NOTE — Assessment & Plan Note (Signed)
 B12 added to labs  Under care of GI taking protonix 40 mg daily

## 2024-02-28 NOTE — Assessment & Plan Note (Signed)
 Discussed how this problem influences overall health and the risks it imposes  Reviewed plan for weight loss with lower calorie diet (via better food choices (lower glycemic and portion control) along with exercise building up to or more than 30 minutes 5 days per week including some aerobic activity and strength training   Exercise limited by chronic pain   DM screening today

## 2024-02-28 NOTE — Assessment & Plan Note (Signed)
 Lab today  Encouraged weight loss with healthy diet and exercise as tolerated

## 2024-02-29 ENCOUNTER — Ambulatory Visit: Payer: Self-pay | Admitting: Family Medicine

## 2024-02-29 DIAGNOSIS — R7303 Prediabetes: Secondary | ICD-10-CM | POA: Insufficient documentation

## 2024-02-29 DIAGNOSIS — E7800 Pure hypercholesterolemia, unspecified: Secondary | ICD-10-CM

## 2024-02-29 LAB — COMPREHENSIVE METABOLIC PANEL WITH GFR
ALT: 23 U/L (ref 0–35)
AST: 21 U/L (ref 0–37)
Albumin: 4.2 g/dL (ref 3.5–5.2)
Alkaline Phosphatase: 80 U/L (ref 39–117)
BUN: 14 mg/dL (ref 6–23)
CO2: 27 meq/L (ref 19–32)
Calcium: 9.2 mg/dL (ref 8.4–10.5)
Chloride: 105 meq/L (ref 96–112)
Creatinine, Ser: 0.77 mg/dL (ref 0.40–1.20)
GFR: 73.57 mL/min (ref >60.00)
Glucose, Bld: 92 mg/dL (ref 70–99)
Potassium: 4.2 meq/L (ref 3.5–5.1)
Sodium: 142 meq/L (ref 135–145)
Total Bilirubin: 0.4 mg/dL (ref 0.2–1.2)
Total Protein: 6.5 g/dL (ref 6.0–8.3)

## 2024-02-29 LAB — VITAMIN B12: Vitamin B-12: 822 pg/mL (ref 211–911)

## 2024-02-29 LAB — TSH: TSH: 1.79 u[IU]/mL (ref 0.35–5.50)

## 2024-02-29 LAB — LIPID PANEL
Cholesterol: 193 mg/dL (ref 0–200)
HDL: 41.5 mg/dL (ref >39.00)
LDL Cholesterol: 112 mg/dL — ABNORMAL HIGH (ref 0–99)
NonHDL: 151.21
Total CHOL/HDL Ratio: 5
Triglycerides: 197 mg/dL — ABNORMAL HIGH (ref 0.0–149.0)
VLDL: 39.4 mg/dL (ref 0.0–40.0)

## 2024-02-29 LAB — HEMOGLOBIN A1C: Hgb A1c MFr Bld: 5.8 % (ref 4.6–6.5)

## 2024-03-03 DIAGNOSIS — D869 Sarcoidosis, unspecified: Secondary | ICD-10-CM | POA: Diagnosis not present

## 2024-03-07 ENCOUNTER — Encounter: Payer: Self-pay | Admitting: Internal Medicine

## 2024-03-08 NOTE — Telephone Encounter (Signed)
 Judith [name] also responded  [name], [name]   I am not sure what happened with this one in April for pap. I have pulled the order and will be processed quickly.

## 2024-03-08 NOTE — Telephone Encounter (Signed)
 When I called Tonya with [employer] she stated she didn't show that they received the Cpap order. [employer] stated she would pull and process the order now. I told [employer] I showed a response from here on 12/23/23 that she received the order and was pulling to process. [employer] had no idea what happen

## 2024-03-20 ENCOUNTER — Ambulatory Visit: Payer: Self-pay

## 2024-03-20 NOTE — Telephone Encounter (Signed)
 Will see patient then Agree with ER and UC precautions

## 2024-03-20 NOTE — Telephone Encounter (Signed)
 FYI Only or Action Required?: Action required by provider: request for appointment.  Patient was last seen in primary care on 02/28/2024 by Randeen Laine LABOR, MD.  Called Nurse Triage reporting Eye Problem.  Symptoms began several days ago.  Interventions attempted: Rest, hydration, or home remedies.  Symptoms are: unchanged.Left eyelid swelling, mild itching. Eye is watery.   Triage Disposition: See Physician Within 24 Hours  Patient/caregiver understands and will follow disposition?: Yes    Copied from CRM [id number]. Topic: Clinical - Red Word Triage >> Mar 20, 2024  1:49 PM Aisha D wrote: Red Word that prompted transfer to Nurse Triage: swollen  Pt stated that her eyelid is red, itchy, irritated and swollen. Pt would like to schedule an appt with provider.    ----------------------------------------------------------------------- From previous Reason for Contact - Scheduling: Patient/patient representative is calling to schedule an appointment. Refer to attachments for appointment information. Reason for Disposition  MODERATE-SEVERE eyelid swelling on one side  (Exception: Due to a mosquito bite.)  Answer Assessment - Initial Assessment Questions 1. ONSET: When did the swelling start? (e.g., minutes, hours, days)     Friday 2. LOCATION: What part of the eyelids is swollen?     Left eye 3. SEVERITY: How swollen is it?     mild 4. ITCHING: Is there any itching? If Yes, ask: How much?   (Scale 1-10; mild, moderate or severe)     mild 5. PAIN: Is the swelling painful to touch? If Yes, ask: How painful is it?   (Scale 1-10; mild, moderate or severe)     no 6. FEVER: Do you have a fever? If Yes, ask: What is it, how was it measured, and when did it start?      no 7. CAUSE: What do you think is causing the swelling?     unsure 8. RECURRENT SYMPTOM: Have you had eyelid swelling before? If Yes, ask: When was the last time? What happened that time?      yes 9. OTHER SYMPTOMS: Do you have any other symptoms? (e.g., blurred vision, eye discharge, rash, runny nose)     Redness, eye watery 10. PREGNANCY: Is there any chance you are pregnant? When was your last menstrual period?       no  Protocols used: Eye - Swelling-A-AH

## 2024-03-20 NOTE — Telephone Encounter (Signed)
 Appt scheduled on 03/22/24

## 2024-03-22 ENCOUNTER — Ambulatory Visit (INDEPENDENT_AMBULATORY_CARE_PROVIDER_SITE_OTHER): Admitting: Family Medicine

## 2024-03-22 ENCOUNTER — Encounter: Payer: Self-pay | Admitting: Family Medicine

## 2024-03-22 VITALS — BP 118/70 | HR 63 | Temp 98.5°F | Ht 60.0 in | Wt 156.2 lb

## 2024-03-22 DIAGNOSIS — H01009 Unspecified blepharitis unspecified eye, unspecified eyelid: Secondary | ICD-10-CM | POA: Insufficient documentation

## 2024-03-22 DIAGNOSIS — H01004 Unspecified blepharitis left upper eyelid: Secondary | ICD-10-CM | POA: Diagnosis not present

## 2024-03-22 NOTE — Patient Instructions (Addendum)
 Continue the erythromycin ointment as needed to the eye lids  Keep the eyelids/lash line clean with a mix of baby shampoo and water   Avoid a lot of excess scents and cosmetics  No eye makeup until you are back to normal   If this correlates with wearing nail polish -stop the nail polish   Stay in touch with eye doctor   If eyelids do not continue to improve let us  know   While warm compresses help/ cool ones may also help itch

## 2024-03-22 NOTE — Progress Notes (Signed)
 Subjective:    Patient ID: Judith [name]  LITTIE Berliner, female    DOB: 30-Apr-1946, 78 y.o.   MRN: [id number]  HPI  Wt Readings from Last 3 Encounters:  03/22/24 156 lb 4 oz (70.9 kg)  02/28/24 156 lb 8 oz (71 kg)  02/14/24 154 lb (69.9 kg)   30.52 kg/m  Vitals:   03/22/24 1202 03/22/24 1223  BP: (!) 152/80 118/70  Pulse: 63   Temp: 98.5 F (36.9 C)   SpO2: 97%     Pt presents with c/o Eye problem   Eyelid is red, itchy , irritated and swollen on the left  Started Friday-got worse over the weekend  No pain or fever   Now both eye lids itch   Eye/eye ball itself is fine-no symptoms   Eyes water baseline    Not outside  Not in sun or heat  Not a lot of sweating   No change in her makeup   Taking prednisone  from her eye doctor for watery eyes  Also uses emycin ointment on lids (they refilled it)  Still on methotrexate   Has baseline dry eyes that make her tear a lot recently   No change in vision   May have some rosacea     Patient Active Problem List   Diagnosis Date Noted   Blepharitis 03/22/2024   Prediabetes 02/29/2024   Current use of proton pump inhibitor 02/28/2024   Class 1 obesity 02/28/2024   Diabetes mellitus screening 02/28/2024   Myofascial pain syndrome 02/28/2024   Fatigue 02/28/2024   Knee pain, bilateral 08/24/2023   Olecranon bursitis of right elbow 08/24/2023   Positive colorectal cancer screening using Cologuard test 02/23/2022   Chronic respiratory failure with hypoxia (HCC) 06/20/2020   OSA (obstructive sleep apnea) 06/20/2020   Degeneration of thoracic intervertebral disc 06/17/2018   Encounter for screening mammogram for breast cancer 01/07/2018   Dyspnea 10/25/2017   Multiple lung nodules on CT 10/23/2017   Spondylolisthesis, lumbar region 10/14/2017   Routine general medical examination at a health care facility 01/06/2016   IBS (irritable colon syndrome) 05/31/2015   Colon polyps 05/31/2015   Estrogen deficiency 12/05/2014    Personal history of other malignant neoplasm of skin 09/21/2013   Sarcoidosis 08/09/2012   Mixed incontinence urge and stress 03/16/2012   Alopecia 11/14/2010   GERD 01/24/2009   NEOPLASM, MALIGNANT, BREAST, HX OF 01/02/2009   Hyperlipidemia 11/17/2007   Osteopenia 11/17/2007   Tinnitus 09/07/2007   Allergic rhinitis 09/07/2007   Diverticulosis of colon 09/07/2007   Fatty liver 09/07/2007   History of diverticulitis 08/12/2007   Past Medical History:  Diagnosis Date   Allergic rhinitis    Allergy    Arthritis    Atrophic vaginitis    Breast cancer (HCC)    Right mastectomy   Cataract 2020   Diverticulosis    GERD (gastroesophageal reflux disease)    H/O: pneumonia    as child-viral pneumonia   Headache    History of shingles    HLD (hyperlipidemia)    pt denies   IBS (irritable bowel syndrome)    Implantable loop recorder present    OSA (obstructive sleep apnea) 06/20/2020   CPAP   Osteopenia    Oxygen  deficiency at night   Sarcoidosis    Sleep apnea 2018 use CPAP   Sliding hiatal hernia    Past Surgical History:  Procedure Laterality Date   APPENDECTOMY     BASAL CELL CARCINOMA EXCISION     scalp  BASAL CELL CARCINOMA EXCISION  08/2016   nose   BREAST SURGERY     CATARACT EXTRACTION W/PHACO Right 04/29/2022   Procedure: CATARACT EXTRACTION PHACO AND INTRAOCULAR LENS PLACEMENT (IOC) RIGHT VIVITY LENS 10.90 01:10.5;  Surgeon: Mittie Gaskin, MD;  Location: [facility];  Service: Ophthalmology;  Laterality: Right;  sleep apnea   CATARACT EXTRACTION W/PHACO Left 05/13/2022   Procedure: CATARACT EXTRACTION PHACO AND INTRAOCULAR LENS PLACEMENT (IOC) LEFT VIVITY LENS 8.84 01:10.1;  Surgeon: Mittie Gaskin, MD;  Location: [facility];  Service: Ophthalmology;  Laterality: Left;  sleep apnea   COLONOSCOPY  02/2010   divertics, rpt 5 years   DEXA  2005   osteopenia, no change from 2003   DILATION AND CURETTAGE OF UTERUS  10/1999   EYE  SURGERY  lasik   cataract   LAPAROSCOPIC TOTAL HYSTERECTOMY  05/2002   for fibroids/polyp   MASTECTOMY Right 02/2009   right breast cancer   MOHS SURGERY  04/17/2022   Nose   POLYPECTOMY     SPINE SURGERY     TOTAL ABDOMINAL HYSTERECTOMY  2003   for fibroids/polyps   TUBAL LIGATION  1977   VIDEO BRONCHOSCOPY WITH ENDOBRONCHIAL NAVIGATION N/A 02/01/2017   Procedure: VIDEO BRONCHOSCOPY WITH ENDOBRONCHIAL NAVIGATION;  Surgeon: Kerrin Elspeth BROCKS, MD;  Location: [facility];  Service: Thoracic;  Laterality: N/A;   Social History   Tobacco Use   Smoking status: Former    Current packs/day: 0.00    Average packs/day: 0.5 packs/day for 5.0 years (2.5 ttl pk-yrs)    Types: Cigarettes    Start date: 74    Quit date: 08/31/1968    Years since quitting: 55.5   Smokeless tobacco: Never  Vaping Use   Vaping status: Never Used  Substance Use Topics   Alcohol use: Not Currently    Comment: occasional glass of wine   Drug use: No   Family History  Problem Relation Age of Onset   Prostate cancer Father    Prostate cancer Brother    Lymphoma Brother    Heart attack Paternal Grandmother    Stomach cancer Maternal Grandfather    Cancer Son    Colon cancer Neg Hx    Rectal cancer Neg Hx    Esophageal cancer Neg Hx    Allergies  Allergen Reactions   Niacin And Related Other (See Comments)    Caused black and blue marks all over body   Pollen Extract Cough    Itchy watery eyes, sneezing   Meloxicam Rash   Current Outpatient Medications on File Prior to Visit  Medication Sig Dispense Refill   acetaminophen  (TYLENOL ) 500 MG tablet Take 500 mg by mouth every 6 (six) hours as needed.     Adalimumab 40 MG/0.4ML PNKT Inject into the skin.     Biotin 5 MG TABS Take by mouth daily.     calcium-vitamin D  (OSCAL WITH D) 500-200 MG-UNIT per tablet Take 1 tablet by mouth once a week.      cholecalciferol (VITAMIN D3) 25 MCG (1000 UNIT) tablet Take 1,000 Units by mouth daily.     DULoxetine  (CYMBALTA) 60 MG capsule Take 60 mg by mouth daily.     erythromycin ophthalmic ointment 1 Application daily as needed.     FIBER SELECT GUMMIES PO Take 2 tablets by mouth at bedtime.     FOLIC ACID  PO Take 1 tablet by mouth daily.     methocarbamol  (ROBAXIN ) 500 MG tablet TAKE 1 TABLET BY MOUTH  EVERY 8 HOURS AS NEEDED FOR MUSCLE SPASMS 90 tablet 0   methotrexate (RHEUMATREX) 2.5 MG tablet Take 20 mg by mouth once a week.     metoprolol succinate (TOPROL-XL) 25 MG 24 hr tablet      Multiple Vitamin (MULTIVITAMIN WITH MINERALS) TABS tablet Take 1 tablet by mouth at bedtime.     OXYGEN  Inhale into the lungs at bedtime. 2.5 - 3.0 L     pantoprazole (PROTONIX) 40 MG tablet Take 40 mg by mouth.     Polyethyl Glycol-Propyl Glycol (SYSTANE) 0.4-0.3 % SOLN Apply to eye daily as needed.     vitamin B-12 (CYANOCOBALAMIN ) 500 MCG tablet Take 500 mcg by mouth 2 (two) times a week.     No current facility-administered medications on file prior to visit.    Review of Systems  Eyes:  Positive for discharge. Negative for photophobia, pain, redness and visual disturbance.       Eye lid symptoms- itch/swelling (not eyes)  Eyes stay watery , also history of dry eye   Skin:        ? Of rosacea in past  No problems now   Neurological:  Negative for headaches.       Objective:   Physical Exam Constitutional:      General: She is not in acute distress.    Appearance: Normal appearance. She is obese. She is not ill-appearing.  HENT:     Nose: Nose normal.     Mouth/Throat:     Mouth: Mucous membranes are moist.  Eyes:     General: No scleral icterus.       Right eye: No discharge.        Left eye: No discharge.     Extraocular Movements: Extraocular movements intact.     Conjunctiva/sclera: Conjunctivae normal.     Pupils: Pupils are equal, round, and reactive to light.     Comments: Left eye waters more than right eye   Eye appears normal   See skin exam for lid findings   Cardiovascular:      Rate and Rhythm: Normal rate and regular rhythm.  Pulmonary:     Effort: Pulmonary effort is normal. No respiratory distress.  Musculoskeletal:     Cervical back: Neck supple.  Lymphadenopathy:     Cervical: No cervical adenopathy.  Skin:    Comments: Mild swelling of left upper eyelid  No erythema  Scant scale  No lesions     Neurological:     Mental Status: She is alert.  Psychiatric:        Mood and Affect: Mood normal.           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

## 2024-03-22 NOTE — Assessment & Plan Note (Addendum)
 Recurrent (had in 2019) In setting of watery eyes (worse on left)-sees oph , and also sarcoid  May have mild rosacea , if so not active today   This course much improved today with use of emycin ointment (has on hand from her oph provider) Discussed hygiene/use of baby shampoo Change cosmetics or products if needed (discussed possible nail polish allergy)  Cool compress if itchy  Update if not starting to improve in a week or if worsening  Call back and Er precautions noted in detail today    Handout given

## 2024-03-23 DIAGNOSIS — G4733 Obstructive sleep apnea (adult) (pediatric): Secondary | ICD-10-CM | POA: Diagnosis not present

## 2024-04-03 DIAGNOSIS — D869 Sarcoidosis, unspecified: Secondary | ICD-10-CM | POA: Diagnosis not present

## 2024-04-06 DIAGNOSIS — L570 Actinic keratosis: Secondary | ICD-10-CM | POA: Diagnosis not present

## 2024-04-06 DIAGNOSIS — D2261 Melanocytic nevi of right upper limb, including shoulder: Secondary | ICD-10-CM | POA: Diagnosis not present

## 2024-04-06 DIAGNOSIS — D225 Melanocytic nevi of trunk: Secondary | ICD-10-CM | POA: Diagnosis not present

## 2024-04-06 DIAGNOSIS — Z85820 Personal history of malignant melanoma of skin: Secondary | ICD-10-CM | POA: Diagnosis not present

## 2024-04-06 DIAGNOSIS — Z09 Encounter for follow-up examination after completed treatment for conditions other than malignant neoplasm: Secondary | ICD-10-CM | POA: Diagnosis not present

## 2024-04-06 DIAGNOSIS — Z08 Encounter for follow-up examination after completed treatment for malignant neoplasm: Secondary | ICD-10-CM | POA: Diagnosis not present

## 2024-04-06 DIAGNOSIS — L821 Other seborrheic keratosis: Secondary | ICD-10-CM | POA: Diagnosis not present

## 2024-04-06 DIAGNOSIS — D2272 Melanocytic nevi of left lower limb, including hip: Secondary | ICD-10-CM | POA: Diagnosis not present

## 2024-04-06 DIAGNOSIS — D2271 Melanocytic nevi of right lower limb, including hip: Secondary | ICD-10-CM | POA: Diagnosis not present

## 2024-04-06 DIAGNOSIS — D2262 Melanocytic nevi of left upper limb, including shoulder: Secondary | ICD-10-CM | POA: Diagnosis not present

## 2024-04-06 DIAGNOSIS — Z872 Personal history of diseases of the skin and subcutaneous tissue: Secondary | ICD-10-CM | POA: Diagnosis not present

## 2024-04-06 DIAGNOSIS — Z85828 Personal history of other malignant neoplasm of skin: Secondary | ICD-10-CM | POA: Diagnosis not present

## 2024-04-23 DIAGNOSIS — G4733 Obstructive sleep apnea (adult) (pediatric): Secondary | ICD-10-CM | POA: Diagnosis not present

## 2024-04-24 DIAGNOSIS — C50911 Malignant neoplasm of unspecified site of right female breast: Secondary | ICD-10-CM | POA: Diagnosis not present

## 2024-05-04 DIAGNOSIS — D869 Sarcoidosis, unspecified: Secondary | ICD-10-CM | POA: Diagnosis not present

## 2024-05-11 ENCOUNTER — Encounter: Payer: Self-pay | Admitting: Internal Medicine

## 2024-05-11 ENCOUNTER — Ambulatory Visit: Admitting: Internal Medicine

## 2024-05-11 VITALS — BP 100/60 | HR 63 | Temp 98.8°F | Ht 61.0 in | Wt 158.4 lb

## 2024-05-11 DIAGNOSIS — D869 Sarcoidosis, unspecified: Secondary | ICD-10-CM

## 2024-05-11 DIAGNOSIS — J9611 Chronic respiratory failure with hypoxia: Secondary | ICD-10-CM

## 2024-05-11 DIAGNOSIS — G4733 Obstructive sleep apnea (adult) (pediatric): Secondary | ICD-10-CM

## 2024-05-11 NOTE — Progress Notes (Signed)
 Name: Judith [name] MRN: [id number] DOB: 1946/04/24     CONSULTATION DATE:05/11/2024  REFERRING MD :  Dr. Kerrin   STUDIES:  CT chest Jan 26, 2017 I have Independently reviewed images of  CT chest    Interpretation:B/l pulm nodules   Previous history 78 year old  abnormal CT chest with bilateral pulmonary nodules Patient has a diagnosis and history of sarcoidosis of the scalp In February 2018 patient developed a chronic cough patient was worked up for chest x-ray and a CT scan of the chest which revealed several pulmonary nodules Patient was then referred to cardiothoracic surgery for further evaluation Dr. 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 Patient sees ophthalmology on an annual basis to assess for sarcoid 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  test WNL ONO revelas signs hypoxia Placed on 2 L [place] oxygen  therapy  patient diagnosed with sleep apnea with AHI of 13 123 events Lowest O2 sat was 75%  CC Follow-up OSA assessment Follow-up sarcoid assessment Follow-up respiratory failure  HPI Assessment stage IV sarcoid Stable at this time Intermittent shortness of breath and dyspnea on exertion Diagnosis of cardiac sarcoid Follows up with [facility] cardiology Chronic immunosuppressive therapy with Humira Also on chronic immunosuppressive therapy with methotrexate  CARDIAC SARCOIDOSIS Dx at [facility] Therapy-chronic prednisone   transitioned to MTX therapy Patient will need prolonged immunotherapy indefinitely  Chronic Hypoxic resp failure due to COPD -Patient benefits from oxygen  therapy 2L [place]  -recommend using oxygen  as prescribed -patient needs this for survival at night     Assessment of OSA Discussed sleep data and reviewed with patient.  Encouraged proper weight management.  Discussed driving precautions and its relationship with hypersomnolence.  Discussed sleep hygiene, and benefits of a fixed sleep waked time.  The importance of getting eight or more hours of sleep discussed with patient.  Discussed limiting the use of the computer and television before bedtime.  Decrease naps during the day, so night time sleep will become enhanced.  Limit caffeine, and sleep deprivation.   Patient uses and benefits from therapy Using CPAP nightly and with naps Pressure setting is comfortable and is sleeping well. Excellent compliance report Discussed with patient in detail Auto CPAP 5-13 100% compliance AHI reduced to less than 1  No exacerbation at this time No evidence of heart failure at this time No evidence or signs of infection at this time No respiratory distress No fevers, chills, nausea, vomiting, diarrhea No evidence of lower extremity edema No evidence hemoptysis       Allergies  Allergen Reactions   Niacin And Related Other (See Comments)    Caused black and blue marks all over body   Pollen Extract Cough    Itchy watery eyes, sneezing   Meloxicam Rash   BP 100/60   Pulse 63   Temp 98.8 F (37.1 C)   Ht 5' 1 (1.549 m)   Wt 158 lb 6.4 oz (71.8 kg)   SpO2 93%   BMI 29.93 kg/m     Physical  Examination:   General Appearance: No distress  EYES PERRLA, EOM intact.   NECK Supple, No JVD Pulmonary: normal breath sounds, No wheezing.  CardiovascularNormal S1,S2.  No m/r/g.   Abdomen: Benign, Soft, non-tender. 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 CPAP prescription 5-13 AHI reduced to  No evidence of acute heart failure at this time No respiratory distress No fevers, chills, nausea, vomiting, diarrhea No evidence hemoptysis  Patient Instructions Continue to use CPAP every night, minimum of 4-6 hours a night.  Change equipment every 30 days or as directed by DME.  Wash your tubing with warm soap and water daily, hang to dry. Wash humidifier portion weekly. Use bottled, distilled water and change daily   Be aware of reduced alertness and do not drive or operate heavy machinery if experiencing this or drowsiness.  Exercise encouraged, as tolerated. Encouraged proper weight management.  Important to get eight or more hours of sleep  Limiting the use of the computer and television before bedtime.  Decrease naps during the day, so night time sleep will become enhanced.  Limit caffeine, and sleep deprivation.  HTN, stroke, uncontrolled diabetes and heart failure are potential risk factors.  Risk of untreated sleep apnea including cardiac arrhthymias, stroke, DM, pulm HTN.  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 along with OSA,Continue oxygen  as prescribed  Sarcoidosis pulmonary stage IV   Sarcoidosis  Pulmonary cardiac  sarcoid Patient on chronic immunosuppressive therapy Follow-up with [facility] On Humira and methotrexate Follow-up to cardiology No exacerbation at this time No evidence of heart failure at this time No evidence or signs of infection at this time No respiratory distress No fevers, chills, nausea, vomiting, diarrhea No evidence of lower extremity edema No evidence hemoptysis     MEDICATION ADJUSTMENTS/LABS AND TESTS ORDERED: Continue immunosuppressive therapy Continue auto CPAP  Continue oxygen  therapy as prescribed  Follow-up [facility] sarcoid clinic  Avoid Allergens and Irritants Avoid secondhand smoke Avoid SICK contacts Recommend  Masking  when appropriate Recommend Keep up-to-date with vaccinations   MEDICATION ADJUSTMENTS/LABS AND TESTS ORDERED:    CURRENT MEDICATIONS REVIEWED AT LENGTH WITH PATIENT TODAY   Patient  satisfied with Plan of action and management. All questions answered   Follow up 1 year   I spent a total of 45 minutes dedicated to the care of this patient on the date of this encounter to include pre-visit review of records, face-to-face time with the patient discussing conditions above, post visit ordering of testing, clinical documentation with the electronic health record, making appropriate referrals as documented, and communicating necessary information to the patient's healthcare team.    The Patient requires high complexity decision making for assessment and support, frequent evaluation and titration of therapies, application of advanced monitoring technologies and extensive interpretation of multiple databases.  Patient satisfied with Plan of action and management. All  questions answered    Nickolas Alm Cellar, M.D.  [facility]  Medical Director [facility] Medical Director [facility] Cardio-Pulmonary Department

## 2024-05-11 NOTE — Assessment & Plan Note (Signed)
  Assessment of OSA Previous AHI 13 Continue CPAP as prescribed  Excellent compliance report Reviewed compliance report in detail with patient Patient definitely benefits the use of CPAP therapy as prescribed Using CPAP nightly and with naps Pressure setting is comfortable and is sleeping well. CPAP prescription 5-13 AHI reduced to  No evidence of acute heart failure at this time No respiratory distress No fevers, chills, nausea, vomiting, diarrhea No evidence hemoptysis  Patient Instructions Continue to use CPAP every night, minimum of 4-6 hours a night.  Change equipment every 30 days or as directed by DME.  Wash your tubing with warm soap and water daily, hang to dry. Wash humidifier portion weekly. Use bottled, distilled water and change daily   Be aware of reduced alertness and do not drive or operate heavy machinery if experiencing this or drowsiness.  Exercise encouraged, as tolerated. Encouraged proper weight management.  Important to get eight or more hours of sleep  Limiting the use of the computer and television before bedtime.  Decrease naps during the day, so night time sleep will become enhanced.  Limit caffeine, and sleep deprivation.  HTN, stroke, uncontrolled diabetes and heart failure are potential risk factors.  Risk of untreated sleep apnea including cardiac arrhthymias, stroke, DM, pulm HTN.  \

## 2024-05-11 NOTE — Patient Instructions (Signed)
 Excellent Job A+ GOLD STAR!!  Continue CPAP as prescribed  Patient Instructions Continue to use CPAP every night, minimum of 4-6 hours a night.  Change equipment every 30 days or as directed by DME.  Wash your tubing with warm soap and water daily, hang to dry. Wash humidifier portion weekly. Use bottled, distilled water and change daily   Be aware of reduced alertness and do not drive or operate heavy machinery if experiencing this or drowsiness.  Exercise encouraged, as tolerated. Encouraged proper weight management.  Important to get eight or more hours of sleep  Limiting the use of the computer and television before bedtime.  Decrease naps during the day, so night time sleep will become enhanced.  Limit caffeine, and sleep deprivation.    Avoid Allergens and Irritants Avoid secondhand smoke Avoid SICK contacts Recommend  Masking  when appropriate Recommend Keep up-to-date with vaccinations   Follow-up at [facility] as scheduled

## 2024-05-11 NOTE — Assessment & Plan Note (Signed)
  Pulmonary cardiac sarcoid Patient on chronic immunosuppressive therapy Follow-up with [facility] On Humira and methotrexate Follow-up to cardiology No exacerbation at this time No evidence of heart failure at this time No evidence or signs of infection at this time No respiratory distress No fevers, chills, nausea, vomiting, diarrhea No evidence of lower extremity edema No evidence hemoptysis

## 2024-05-11 NOTE — Assessment & Plan Note (Signed)
 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

## 2024-05-12 DIAGNOSIS — C50911 Malignant neoplasm of unspecified site of right female breast: Secondary | ICD-10-CM | POA: Diagnosis not present

## 2024-05-15 DIAGNOSIS — G4733 Obstructive sleep apnea (adult) (pediatric): Secondary | ICD-10-CM | POA: Diagnosis not present

## 2024-05-24 DIAGNOSIS — G4733 Obstructive sleep apnea (adult) (pediatric): Secondary | ICD-10-CM | POA: Diagnosis not present

## 2024-05-29 DIAGNOSIS — K219 Gastro-esophageal reflux disease without esophagitis: Secondary | ICD-10-CM | POA: Diagnosis not present

## 2024-05-29 DIAGNOSIS — R142 Eructation: Secondary | ICD-10-CM | POA: Diagnosis not present

## 2024-05-29 DIAGNOSIS — Z23 Encounter for immunization: Secondary | ICD-10-CM | POA: Diagnosis not present

## 2024-05-29 DIAGNOSIS — R143 Flatulence: Secondary | ICD-10-CM | POA: Diagnosis not present

## 2024-05-29 DIAGNOSIS — K580 Irritable bowel syndrome with diarrhea: Secondary | ICD-10-CM | POA: Diagnosis not present

## 2024-07-04 DIAGNOSIS — D869 Sarcoidosis, unspecified: Secondary | ICD-10-CM | POA: Diagnosis not present

## 2024-07-24 DIAGNOSIS — G4733 Obstructive sleep apnea (adult) (pediatric): Secondary | ICD-10-CM | POA: Diagnosis not present

## 2024-07-31 DIAGNOSIS — D869 Sarcoidosis, unspecified: Secondary | ICD-10-CM | POA: Diagnosis not present

## 2024-07-31 DIAGNOSIS — R0602 Shortness of breath: Secondary | ICD-10-CM | POA: Diagnosis not present

## 2024-07-31 DIAGNOSIS — D8685 Sarcoid myocarditis: Secondary | ICD-10-CM | POA: Diagnosis not present

## 2024-07-31 DIAGNOSIS — J219 Acute bronchiolitis, unspecified: Secondary | ICD-10-CM | POA: Diagnosis not present

## 2024-07-31 DIAGNOSIS — J40 Bronchitis, not specified as acute or chronic: Secondary | ICD-10-CM | POA: Diagnosis not present

## 2024-07-31 DIAGNOSIS — J849 Interstitial pulmonary disease, unspecified: Secondary | ICD-10-CM | POA: Diagnosis not present

## 2024-08-01 ENCOUNTER — Other Ambulatory Visit: Payer: Self-pay | Admitting: Family Medicine

## 2024-08-01 DIAGNOSIS — Z1231 Encounter for screening mammogram for malignant neoplasm of breast: Secondary | ICD-10-CM

## 2024-08-03 DIAGNOSIS — D869 Sarcoidosis, unspecified: Secondary | ICD-10-CM | POA: Diagnosis not present

## 2024-08-07 ENCOUNTER — Inpatient Hospital Stay: Admission: RE | Admit: 2024-08-07 | Discharge: 2024-08-07 | Attending: Family Medicine | Admitting: Family Medicine

## 2024-08-07 ENCOUNTER — Other Ambulatory Visit: Payer: Self-pay | Admitting: Family Medicine

## 2024-08-07 DIAGNOSIS — Z1231 Encounter for screening mammogram for malignant neoplasm of breast: Secondary | ICD-10-CM

## 2024-08-11 ENCOUNTER — Ambulatory Visit (INDEPENDENT_AMBULATORY_CARE_PROVIDER_SITE_OTHER): Admitting: Family Medicine

## 2024-08-11 VITALS — BP 104/72 | HR 82 | Temp 99.0°F | Ht 60.0 in | Wt 161.0 lb

## 2024-08-11 DIAGNOSIS — H66002 Acute suppurative otitis media without spontaneous rupture of ear drum, left ear: Secondary | ICD-10-CM | POA: Diagnosis not present

## 2024-08-11 MED ORDER — AZITHROMYCIN 250 MG PO TABS: ORAL_TABLET | ORAL | 0 refills | Status: DC

## 2024-08-11 NOTE — Assessment & Plan Note (Signed)
 Acute, will treat with antibiotics.  Given patient is on methotrexate, amoxicillin  is contraindicated. Will treat with azithromycin  course.  Can use Tylenol  for pain.  Recommend rest and fluids.  Return and ER precautions provided.

## 2024-08-11 NOTE — Progress Notes (Signed)
 Patient ID: Judith [name], female    DOB: 07/28/1946, 78 y.o.   MRN: [id number]  This visit was conducted in person.  BP 104/72   Pulse 82   Temp 99 F (37.2 C) (Temporal)   Ht 5' (1.524 m)   Wt 161 lb (73 kg)   SpO2 93%   BMI 31.44 kg/m    CC:  Chief Complaint  Patient presents with   Sore Throat    X 3 days   Ear Pain    Subjective:   HPI: Judith  L [name] is a 78 y.o. female presenting on 08/11/2024 for Sore Throat (X 3 days) and Ear Pain   Date of onset:   in last 2 weeks worsening  cough, productive, now in last 3 days Initial symptoms included  ST Symptoms progressed to left ear pain, decrease hearing from left ear.  Stable SOB, no wheeze   no fever  Sick contacts:  yes COVID testing:   none    She has tried to treat with  tylenol     Interstitial lung disease stage IV sarcoid  recent had chronic respiratory failure .SABRA No on new inhaler.  No  history of chronic lung disease such as asthma or COPD.  Non-smoker.      Relevant past medical, surgical, family and social history reviewed and updated as indicated. Interim medical history since our last visit reviewed. Allergies and medications reviewed and updated. Outpatient Medications Prior to Visit  Medication Sig Dispense Refill   acetaminophen  (TYLENOL ) 500 MG tablet Take 500 mg by mouth every 6 (six) hours as needed.     Adalimumab 40 MG/0.4ML PNKT Inject into the skin.     Biotin 5 MG TABS Take by mouth daily.     calcium-vitamin D  (OSCAL WITH D) 500-200 MG-UNIT per tablet Take 1 tablet by mouth once a week.      cholecalciferol (VITAMIN D3) 25 MCG (1000 UNIT) tablet Take 1,000 Units by mouth daily.     DULoxetine (CYMBALTA) 60 MG capsule Take 60 mg by mouth daily.     erythromycin ophthalmic ointment 1 Application daily as needed.     FIBER SELECT GUMMIES PO Take 2 tablets by mouth at bedtime.     fluticasone -salmeterol (ADVAIR) 100-50 MCG/ACT AEPB Inhale 1 puff into the lungs 2 (two) times  daily.     folic acid  (FOLVITE ) 1 MG tablet Take 1 mg by mouth daily.     methocarbamol  (ROBAXIN ) 500 MG tablet TAKE 1 TABLET BY MOUTH EVERY 8 HOURS AS NEEDED FOR MUSCLE SPASMS 90 tablet 0   methotrexate (RHEUMATREX) 2.5 MG tablet Take 20 mg by mouth once a week.     metoprolol succinate (TOPROL-XL) 25 MG 24 hr tablet      Multiple Vitamin (MULTIVITAMIN WITH MINERALS) TABS tablet Take 1 tablet by mouth at bedtime.     OXYGEN  Inhale into the lungs at bedtime. 2.5 - 3.0 L     pantoprazole (PROTONIX) 40 MG tablet Take 40 mg by mouth.     Polyethyl Glycol-Propyl Glycol (SYSTANE) 0.4-0.3 % SOLN Apply to eye daily as needed.     vitamin B-12 (CYANOCOBALAMIN ) 500 MCG tablet Take 500 mcg by mouth 2 (two) times a week.     DULoxetine (CYMBALTA) 30 MG capsule Take 30 mg by mouth daily.     FOLIC ACID  PO Take 1 tablet by mouth daily.     No facility-administered medications prior to visit.     Per HPI unless  specifically indicated in ROS section below Review of Systems Objective:  BP 104/72   Pulse 82   Temp 99 F (37.2 C) (Temporal)   Ht 5' (1.524 m)   Wt 161 lb (73 kg)   SpO2 93%   BMI 31.44 kg/m   Wt Readings from Last 3 Encounters:  08/11/24 161 lb (73 kg)  05/11/24 158 lb 6.4 oz (71.8 kg)  03/22/24 156 lb 4 oz (70.9 kg)      Physical Exam Constitutional:      General: She is not in acute distress.    Appearance: She is well-developed. She is not ill-appearing or toxic-appearing.  HENT:     Head: Normocephalic.     Right Ear: Hearing, tympanic membrane, ear canal and external ear normal. Tympanic membrane is not erythematous, retracted or bulging.     Left Ear: Hearing, ear canal and external ear normal. A middle ear effusion is present. Tympanic membrane is erythematous and bulging. Tympanic membrane is not retracted.     Nose: Mucosal edema and rhinorrhea present.     Right Sinus: No maxillary sinus tenderness or frontal sinus tenderness.     Left Sinus: No maxillary sinus  tenderness or frontal sinus tenderness.     Mouth/Throat:     Pharynx: Uvula midline. Oropharyngeal exudate and posterior oropharyngeal erythema present. No uvula swelling.  Eyes:     General: Lids are normal. Lids are everted, no foreign bodies appreciated.     Conjunctiva/sclera: Conjunctivae normal.     Pupils: Pupils are equal, round, and reactive to light.  Neck:     Thyroid : No thyroid  mass or thyromegaly.     Vascular: No carotid bruit.     Trachea: Trachea normal.  Cardiovascular:     Rate and Rhythm: Normal rate and regular rhythm.     Pulses: Normal pulses.     Heart sounds: Normal heart sounds, S1 normal and S2 normal. No murmur heard.    No friction rub. No gallop.  Pulmonary:     Effort: Pulmonary effort is normal. No tachypnea or respiratory distress.     Breath sounds: Normal breath sounds. No decreased breath sounds, wheezing, rhonchi or rales.  Musculoskeletal:     Cervical back: Normal range of motion and neck supple.  Skin:    General: Skin is warm and dry.     Findings: No rash.  Neurological:     Mental Status: She is alert.  Psychiatric:        Mood and Affect: Mood is not anxious or depressed.        Speech: Speech normal.        Behavior: Behavior normal. Behavior is cooperative.        Judgment: Judgment normal.       Results for orders placed or performed in visit on 02/28/24  TSH   Collection Time: 02/28/24  4:03 PM  Result Value Ref Range   TSH 1.79 0.35 - 5.50 uIU/mL  Lipid panel   Collection Time: 02/28/24  4:03 PM  Result Value Ref Range   Cholesterol 193 0 - 200 mg/dL   Triglycerides 802.9 (H) 0.0 - 149.0 mg/dL   HDL 58.49 >60.99 mg/dL   VLDL 60.5 0.0 - 59.9 mg/dL   LDL Cholesterol 887 (H) 0 - 99 mg/dL   Total CHOL/HDL Ratio 5    NonHDL 151.21   Hemoglobin A1c   Collection Time: 02/28/24  4:03 PM  Result Value Ref Range   Hgb A1c MFr Bld  5.8 4.6 - 6.5 %  Vitamin B12   Collection Time: 02/28/24  4:03 PM  Result Value Ref Range    Vitamin B-12 822 211 - 911 pg/mL  Comprehensive metabolic panel with GFR   Collection Time: 02/28/24  4:03 PM  Result Value Ref Range   Sodium 142 135 - 145 mEq/L   Potassium 4.2 3.5 - 5.1 mEq/L   Chloride 105 96 - 112 mEq/L   CO2 27 19 - 32 mEq/L   Glucose, Bld 92 70 - 99 mg/dL   BUN 14 6 - 23 mg/dL   Creatinine, Ser 9.22 0.40 - 1.20 mg/dL   Total Bilirubin 0.4 0.2 - 1.2 mg/dL   Alkaline Phosphatase 80 39 - 117 U/L   AST 21 0 - 37 U/L   ALT 23 0 - 35 U/L   Total Protein 6.5 6.0 - 8.3 g/dL   Albumin 4.2 3.5 - 5.2 g/dL   GFR 26.42 >39.99 mL/min   Calcium 9.2 8.4 - 10.5 mg/dL    Assessment and Plan  Non-recurrent acute suppurative otitis media of left ear without spontaneous rupture of tympanic membrane Assessment & Plan: Acute, will treat with antibiotics.  Given patient is on methotrexate, amoxicillin  is contraindicated. Will treat with azithromycin  course.  Can use Tylenol  for pain.  Recommend rest and fluids.  Return and ER precautions provided.   Other orders -     Azithromycin ; 2 tab po x 1 day then 1 tab po daily  Dispense: 6 tablet; Refill: 0    No follow-ups on file.   Greig Ring, MD

## 2024-08-13 ENCOUNTER — Ambulatory Visit: Payer: Self-pay | Admitting: Family Medicine

## 2024-08-15 DIAGNOSIS — G4733 Obstructive sleep apnea (adult) (pediatric): Secondary | ICD-10-CM | POA: Diagnosis not present

## 2024-08-21 ENCOUNTER — Ambulatory Visit: Payer: Self-pay

## 2024-08-21 ENCOUNTER — Encounter: Payer: Self-pay | Admitting: Family Medicine

## 2024-08-21 NOTE — Telephone Encounter (Signed)
 Aware, will watch for correspondence Thanks for seeing her

## 2024-08-21 NOTE — Telephone Encounter (Signed)
 FYI Only or Action Required?: FYI only for provider: appointment scheduled on 08/23/24.  Patient was last seen in primary care on 08/11/2024 by Avelina Greig BRAVO, MD.  Called Nurse Triage reporting Sore Throat.  Symptoms began a week ago.  Interventions attempted: Prescription medications: zithromax  completed and Rest, hydration, or home remedies.  Symptoms are: gradually worsening.  Triage Disposition: See Physician Within 24 Hours  Patient/caregiver understands and will follow disposition?: Yes, but will wait   Copied from CRM #[id number]. Topic: Clinical - Red Word Triage >> Aug 21, 2024  9:22 AM Jasmin G wrote: [facility] that prompted transfer to Nurse Triage: Pt states that she got prescribed antibiotics recently to treat an ear infection, but symptoms persist despite treatment. Pt is also experiencing swollen lymph nodes. Reason for Disposition  Earache also present  Answer Assessment - Initial Assessment Questions Completed 5 days a zithromax . Left ear has improved but hearing not improved, still feels ear congestion. Sore throat persists and now has inflamed glands in her neck.  Appointment scheduled on 08/23/24   1. ONSET: When did the throat start hurting? (Hours or days ago)      08/12/24 2. SEVERITY: How bad is the sore throat? (Scale 1-10; mild, moderate or severe)     mild 3. STREP EXPOSURE: Has there been any exposure to strep within the past week? If Yes, ask: What type of contact occurred?       4.  VIRAL SYMPTOMS: Are there any symptoms of a cold, such as a runny nose, cough, hoarse voice or red eyes?      Ear tenderness  5. FEVER: Do you have a fever? If Yes, ask: What is your temperature, how was it measured, and when did it start?     denies 6. PUS ON THE TONSILS: Is there pus on the tonsils in the back of your throat?     denies 7. OTHER SYMPTOMS: Do you have any other symptoms? (e.g., difficulty breathing, headache, rash)     Swollen neck  glands, ear congestion 8. PREGNANCY: Is there any chance you are pregnant? When was your last menstrual period?  Protocols used: Sore Throat-A-AH

## 2024-08-21 NOTE — Telephone Encounter (Signed)
 Noted, will evaluate.

## 2024-08-21 NOTE — Telephone Encounter (Signed)
 Next Appt With Family Medicine Judith MARLA Gaskins, NP) 08/23/2024 at 11:40 AM

## 2024-08-23 ENCOUNTER — Ambulatory Visit (INDEPENDENT_AMBULATORY_CARE_PROVIDER_SITE_OTHER): Admitting: Primary Care

## 2024-08-23 VITALS — BP 124/72 | HR 80 | Temp 98.1°F | Ht 60.0 in | Wt 160.4 lb

## 2024-08-23 DIAGNOSIS — G4733 Obstructive sleep apnea (adult) (pediatric): Secondary | ICD-10-CM | POA: Diagnosis not present

## 2024-08-23 DIAGNOSIS — H938X3 Other specified disorders of ear, bilateral: Secondary | ICD-10-CM | POA: Diagnosis not present

## 2024-08-23 DIAGNOSIS — J029 Acute pharyngitis, unspecified: Secondary | ICD-10-CM

## 2024-08-23 MED ORDER — PREDNISONE 20 MG PO TABS: ORAL_TABLET | ORAL | 0 refills | Status: AC

## 2024-08-23 NOTE — Progress Notes (Signed)
 "  Subjective:    Patient ID: Judith [name], female    DOB: 08/11/1946, 78 y.o.   MRN: [id number]  Judith  L [name] is a very pleasant 78 y.o. female patient of Dr. Randeen with a history of allergic rhinitis, chronic respiratory failure with hypoxia, OSA, acute otitis media, tinnitus who presents today to discuss sore throat.  Evaluated by Dr. Avelina on 08/11/2024 for a 2-week history of productive cough that had increased the prior 3 days, also with sore throat.  She was diagnosed with acute suppurative otitis media of the left ear without rupture.  She was treated with azithromycin  antibiotics as amoxicillin  is contraindicated while on methotrexate.  Today she discusses feeling much better overall from her last visit. She completed her azithromycin  course as prescribed.  She does notice residual symptoms of bilateral ear fullness with discomfort with cough and swallowing, sore throat, neck soreness. .   She denies fevers.   She's been taking a cough suppressant at night, Mucinex , and Tylenol .    Review of Systems  Constitutional:  Negative for chills and fever.  HENT:  Positive for sore throat.        Ear fullness  Musculoskeletal:  Positive for myalgias.         Past Medical History:  Diagnosis Date   Allergic rhinitis    Allergy    Arthritis    Atrophic vaginitis    Breast cancer (HCC)    Right mastectomy   Cataract 2020   Diverticulosis    GERD (gastroesophageal reflux disease)    H/O: pneumonia    as child-viral pneumonia   Headache    History of shingles    HLD (hyperlipidemia)    pt denies   IBS (irritable bowel syndrome)    Implantable loop recorder present    OSA (obstructive sleep apnea) 06/20/2020   CPAP   Osteopenia    Oxygen  deficiency at night   Sarcoidosis    Sleep apnea 2018 use CPAP   Sliding hiatal hernia     Social History   Socioeconomic History   Marital status: Married    Spouse name: Not on file   Number of children: 2   Years  of education: Not on file   Highest education level: Bachelor's degree (e.g., BA, AB, BS)  Occupational History   Occupation: Retired  Tobacco Use   Smoking status: Former    Current packs/day: 0.00    Average packs/day: 0.5 packs/day for 5.0 years (2.5 ttl pk-yrs)    Types: Cigarettes    Start date: 22    Quit date: 08/31/1968    Years since quitting: 56.0   Smokeless tobacco: Never  Vaping Use   Vaping status: Never Used  Substance and Sexual Activity   Alcohol use: Not Currently    Comment: occasional glass of wine   Drug use: No   Sexual activity: Yes    Birth control/protection: Post-menopausal    Comment: hysterectomy  Other Topics Concern   Not on file  Social History Narrative   Married      2 sons      Retired      2 cups coffee/daily      Gym 3-4 times/week   Social Drivers of Health   Tobacco Use: Medium Risk (07/31/2024)   Received from [facility]   Patient History    Passive Exposure: Not on file    Smoking Tobacco Use: Former    Smokeless Tobacco Use: Never  Surveyor, Quantity  Resource Strain: Low Risk (08/22/2024)   Overall Financial Resource Strain (CARDIA)    Difficulty of Paying Living Expenses: Not hard at all  Food Insecurity: No Food Insecurity (08/22/2024)   Epic    Worried About Programme Researcher, Broadcasting/film/video in the Last Year: Never true    Ran Out of Food in the Last Year: Never true  Transportation Needs: No Transportation Needs (08/22/2024)   Epic    Lack of Transportation (Medical): No    Lack of Transportation (Non-Medical): No  Physical Activity: Insufficiently Active (08/22/2024)   Exercise Vital Sign    Days of Exercise per Week: 1 day    Minutes of Exercise per Session: 20 min  Stress: No Stress Concern Present (08/22/2024)   [employer] of Occupational Health - Occupational Stress Questionnaire    Feeling of Stress: Only a little  Social Connections: Moderately Integrated (08/22/2024)   Social Connection and Isolation  Panel    Frequency of Communication with Friends and Family: Twice a week    Frequency of Social Gatherings with Friends and Family: Once a week    Attends Religious Services: Never    Database Administrator or Organizations: Yes    Attends Engineer, Structural: More than 4 times per year    Marital Status: Married  Catering Manager Violence: Not At Risk (02/14/2024)   Epic    Fear of Current or Ex-Partner: No    Emotionally Abused: No    Physically Abused: No    Sexually Abused: No  Depression (PHQ2-9): Low Risk (08/23/2024)   Depression (PHQ2-9)    PHQ-2 Score: 0  Alcohol Screen: Low Risk (02/13/2024)   Alcohol Screen    Last Alcohol Screening Score (AUDIT): 0  Housing: Low Risk (08/22/2024)   Epic    Unable to Pay for Housing in the Last Year: No    Number of Times Moved in the Last Year: 0    Homeless in the Last Year: No  Utilities: Not At Risk (02/21/2024)   Received from [facility]   Epic    In the past 12 months has the electric, gas, oil, or water company threatened to shut off services in your home?: No  Health Literacy: Adequate Health Literacy (02/14/2024)   B1300 Health Literacy    Frequency of need for help with medical instructions: Never    Past Surgical History:  Procedure Laterality Date   APPENDECTOMY     BASAL CELL CARCINOMA EXCISION     scalp   BASAL CELL CARCINOMA EXCISION  08/2016   nose   BREAST SURGERY     CATARACT EXTRACTION W/PHACO Right 04/29/2022   Procedure: CATARACT EXTRACTION PHACO AND INTRAOCULAR LENS PLACEMENT (IOC) RIGHT VIVITY LENS 10.90 01:10.5;  Surgeon: Mittie Gaskin, MD;  Location: [facility];  Service: Ophthalmology;  Laterality: Right;  sleep apnea   CATARACT EXTRACTION W/PHACO Left 05/13/2022   Procedure: CATARACT EXTRACTION PHACO AND INTRAOCULAR LENS PLACEMENT (IOC) LEFT VIVITY LENS 8.84 01:10.1;  Surgeon: Mittie Gaskin, MD;  Location: [facility];  Service: Ophthalmology;   Laterality: Left;  sleep apnea   COLONOSCOPY  02/2010   divertics, rpt 5 years   DEXA  2005   osteopenia, no change from 2003   DILATION AND CURETTAGE OF UTERUS  10/1999   EYE SURGERY  lasik   cataract   LAPAROSCOPIC TOTAL HYSTERECTOMY  05/2002   for fibroids/polyp   MASTECTOMY Right 02/2009   right breast cancer   MOHS SURGERY  04/17/2022   Nose   POLYPECTOMY     SPINE SURGERY     TOTAL ABDOMINAL HYSTERECTOMY  2003   for fibroids/polyps   TUBAL LIGATION  1977   VIDEO BRONCHOSCOPY WITH ENDOBRONCHIAL NAVIGATION N/A 02/01/2017   Procedure: VIDEO BRONCHOSCOPY WITH ENDOBRONCHIAL NAVIGATION;  Surgeon: Kerrin Elspeth BROCKS, MD;  Location: [facility] OR;  Service: Thoracic;  Laterality: N/A;    Family History  Problem Relation Age of Onset   Prostate cancer Father    Prostate cancer Brother    Lymphoma Brother    Heart attack Paternal Grandmother    Stomach cancer Maternal Grandfather    Cancer Son    Colon cancer Neg Hx    Rectal cancer Neg Hx    Esophageal cancer Neg Hx     Allergies[1]  Medications Ordered Prior to Encounter[2]  BP 124/72   Pulse 80   Temp 98.1 F (36.7 C) (Oral)   Ht 5' (1.524 m)   Wt 160 lb 6 oz (72.7 kg)   SpO2 94%   BMI 31.32 kg/m  Objective:   Physical Exam HENT:     Right Ear: Tympanic membrane and ear canal normal.     Left Ear: Ear canal normal. Tympanic membrane is erythematous and bulging.     Ears:     Comments: Mild erythema noted to left TM Cardiovascular:     Rate and Rhythm: Normal rate and regular rhythm.  Pulmonary:     Effort: Pulmonary effort is normal.     Breath sounds: Normal breath sounds.  Skin:    General: Skin is warm and dry.  Neurological:     Mental Status: She is alert.     Physical Exam        Assessment & Plan:  Sensation of fullness in both ears Assessment & Plan: Exam today difficult as she does have mild erythema to left TM but this could be improved compared to initial exam a few weeks ago.  Given  that she is feeling much better we will assume that there is no recurrent infection. For her symptoms we will start prednisone  burst. She agrees.   Start prednisone  20 mg tablets. Take 2 tablets by mouth once daily in the morning for 5 days.  She will update if symptoms do not resolve and or become worse.  Orders: -     predniSONE ; Take 2 tablets by mouth once daily in the morning for 5 days.  Dispense: 10 tablet; Refill: 0  Sore throat    Assessment and Plan Assessment & Plan         Comer MARLA Gaskins, NP       [1]  Allergies Allergen Reactions   Niacin And Related Other (See Comments)    Caused black and blue marks all over body   Pollen Extract Cough    Itchy watery eyes, sneezing   Meloxicam Rash  [2]  Current Outpatient Medications on File Prior to Visit  Medication Sig Dispense Refill   acetaminophen  (TYLENOL ) 500 MG tablet Take 500 mg by mouth every 6 (six) hours as needed.     Adalimumab 40 MG/0.4ML PNKT Inject into the skin.     Biotin 5 MG TABS Take by mouth daily.     calcium-vitamin D  (OSCAL WITH D) 500-200 MG-UNIT per tablet Take 1 tablet by mouth once a week.      cholecalciferol (VITAMIN D3) 25 MCG (1000 UNIT) tablet Take 1,000 Units by mouth daily.     DULoxetine (CYMBALTA)  60 MG capsule Take 60 mg by mouth daily.     erythromycin ophthalmic ointment 1 Application daily as needed.     FIBER SELECT GUMMIES PO Take 2 tablets by mouth at bedtime.     fluticasone -salmeterol (ADVAIR) 100-50 MCG/ACT AEPB Inhale 1 puff into the lungs 2 (two) times daily.     folic acid  (FOLVITE ) 1 MG tablet Take 1 mg by mouth daily.     methocarbamol  (ROBAXIN ) 500 MG tablet TAKE 1 TABLET BY MOUTH EVERY 8 HOURS AS NEEDED FOR MUSCLE SPASMS 90 tablet 0   methotrexate (RHEUMATREX) 2.5 MG tablet Take 20 mg by mouth once a week.     metoprolol succinate (TOPROL-XL) 25 MG 24 hr tablet      Multiple Vitamin (MULTIVITAMIN WITH MINERALS) TABS tablet Take 1 tablet by mouth at  bedtime.     OXYGEN  Inhale into the lungs at bedtime. 2.5 - 3.0 L     pantoprazole (PROTONIX) 40 MG tablet Take 40 mg by mouth.     Polyethyl Glycol-Propyl Glycol (SYSTANE) 0.4-0.3 % SOLN Apply to eye daily as needed.     vitamin B-12 (CYANOCOBALAMIN ) 500 MCG tablet Take 500 mcg by mouth 2 (two) times a week.     No current facility-administered medications on file prior to visit.   "

## 2024-08-23 NOTE — Assessment & Plan Note (Signed)
 Exam today difficult as she does have mild erythema to left TM but this could be improved compared to initial exam a few weeks ago.  Given that she is feeling much better we will assume that there is no recurrent infection. For her symptoms we will start prednisone  burst. She agrees.   Start prednisone  20 mg tablets. Take 2 tablets by mouth once daily in the morning for 5 days.  She will update if symptoms do not resolve and or become worse.

## 2024-08-23 NOTE — Patient Instructions (Signed)
 Start prednisone  20 mg tablets. Take 2 tablets by mouth once daily in the morning for 5 days.  Please update us  if no improvement.  It was a pleasure meeting you!

## 2025-02-15 ENCOUNTER — Ambulatory Visit
